# Patient Record
Sex: Male | Born: 1940 | Race: Black or African American | Hispanic: No | Marital: Single | Smoking: Former smoker
Health system: Southern US, Community
[De-identification: ages and names within clinical notes are randomized; demographics above are authoritative.]

## PROBLEM LIST (undated history)

## (undated) DIAGNOSIS — I6930 Unspecified sequelae of cerebral infarction: Secondary | ICD-10-CM

## (undated) DIAGNOSIS — I10 Essential (primary) hypertension: Secondary | ICD-10-CM

## (undated) DIAGNOSIS — Z981 Arthrodesis status: Secondary | ICD-10-CM

## (undated) DIAGNOSIS — N189 Chronic kidney disease, unspecified: Secondary | ICD-10-CM

## (undated) DIAGNOSIS — D649 Anemia, unspecified: Principal | ICD-10-CM

## (undated) DIAGNOSIS — M21372 Foot drop, left foot: Secondary | ICD-10-CM

## (undated) DIAGNOSIS — I2510 Atherosclerotic heart disease of native coronary artery without angina pectoris: Secondary | ICD-10-CM

## (undated) DIAGNOSIS — I509 Heart failure, unspecified: Secondary | ICD-10-CM

## (undated) DIAGNOSIS — I639 Cerebral infarction, unspecified: Secondary | ICD-10-CM

## (undated) DIAGNOSIS — I739 Peripheral vascular disease, unspecified: Secondary | ICD-10-CM

## (undated) HISTORY — DX: Essential (primary) hypertension: I10

## (undated) HISTORY — PX: TONSILECTOMY, ADENOIDECTOMY, BILATERAL MYRINGOTOMY AND TUBES: SHX2538

## (undated) HISTORY — DX: Anemia, unspecified: D64.9

## (undated) HISTORY — DX: Cerebral infarction, unspecified: I63.9

## (undated) HISTORY — DX: Peripheral vascular disease, unspecified: I73.9

## (undated) HISTORY — PX: EYE SURGERY: SHX253

## (undated) HISTORY — PX: CATARACT EXTRACTION: SUR2

## (undated) HISTORY — DX: Foot drop, left foot: M21.372

## (undated) HISTORY — DX: Unspecified sequelae of cerebral infarction: I69.30

---

## 1997-12-08 ENCOUNTER — Inpatient Hospital Stay (HOSPITAL_COMMUNITY): Admission: RE | Admit: 1997-12-08 | Discharge: 1997-12-12 | Payer: Self-pay | Admitting: Orthopaedic Surgery

## 1998-12-11 ENCOUNTER — Encounter: Payer: Self-pay | Admitting: Orthopedic Surgery

## 1998-12-11 ENCOUNTER — Ambulatory Visit (HOSPITAL_COMMUNITY): Admission: RE | Admit: 1998-12-11 | Discharge: 1998-12-11 | Payer: Self-pay | Admitting: Orthopedic Surgery

## 1999-01-08 ENCOUNTER — Ambulatory Visit (HOSPITAL_COMMUNITY): Admission: RE | Admit: 1999-01-08 | Discharge: 1999-01-08 | Payer: Self-pay | Admitting: Orthopedic Surgery

## 1999-01-08 ENCOUNTER — Encounter: Payer: Self-pay | Admitting: Orthopedic Surgery

## 1999-11-16 ENCOUNTER — Ambulatory Visit (HOSPITAL_COMMUNITY): Admission: RE | Admit: 1999-11-16 | Discharge: 1999-11-16 | Payer: Self-pay | Admitting: Orthopedic Surgery

## 1999-11-16 ENCOUNTER — Encounter: Payer: Self-pay | Admitting: Orthopedic Surgery

## 1999-11-29 ENCOUNTER — Encounter: Payer: Self-pay | Admitting: Orthopedic Surgery

## 1999-11-29 ENCOUNTER — Ambulatory Visit (HOSPITAL_COMMUNITY): Admission: RE | Admit: 1999-11-29 | Discharge: 1999-11-29 | Payer: Self-pay | Admitting: Orthopedic Surgery

## 1999-12-13 ENCOUNTER — Ambulatory Visit (HOSPITAL_COMMUNITY): Admission: RE | Admit: 1999-12-13 | Discharge: 1999-12-13 | Payer: Self-pay | Admitting: Orthopedic Surgery

## 1999-12-13 ENCOUNTER — Encounter: Payer: Self-pay | Admitting: Orthopedic Surgery

## 1999-12-27 ENCOUNTER — Encounter: Payer: Self-pay | Admitting: Orthopedic Surgery

## 1999-12-27 ENCOUNTER — Ambulatory Visit (HOSPITAL_COMMUNITY): Admission: RE | Admit: 1999-12-27 | Discharge: 1999-12-27 | Payer: Self-pay | Admitting: Orthopedic Surgery

## 2000-11-23 ENCOUNTER — Emergency Department (HOSPITAL_COMMUNITY): Admission: EM | Admit: 2000-11-23 | Discharge: 2000-11-23 | Payer: Self-pay

## 2000-11-27 ENCOUNTER — Encounter: Payer: Self-pay | Admitting: Nephrology

## 2000-11-27 ENCOUNTER — Encounter: Admission: RE | Admit: 2000-11-27 | Discharge: 2000-11-27 | Payer: Self-pay | Admitting: Nephrology

## 2001-03-16 ENCOUNTER — Encounter: Payer: Self-pay | Admitting: Nephrology

## 2001-03-16 ENCOUNTER — Encounter: Admission: RE | Admit: 2001-03-16 | Discharge: 2001-03-16 | Payer: Self-pay | Admitting: Nephrology

## 2001-04-21 ENCOUNTER — Encounter: Admission: RE | Admit: 2001-04-21 | Discharge: 2001-04-21 | Payer: Self-pay | Admitting: Neurosurgery

## 2001-04-21 ENCOUNTER — Encounter: Payer: Self-pay | Admitting: Neurosurgery

## 2004-02-19 ENCOUNTER — Emergency Department (HOSPITAL_COMMUNITY): Admission: EM | Admit: 2004-02-19 | Discharge: 2004-02-19 | Payer: Self-pay | Admitting: Emergency Medicine

## 2004-08-19 HISTORY — PX: KNEE SURGERY: SHX244

## 2004-10-16 ENCOUNTER — Emergency Department (HOSPITAL_COMMUNITY): Admission: EM | Admit: 2004-10-16 | Discharge: 2004-10-16 | Payer: Self-pay | Admitting: Emergency Medicine

## 2005-01-03 ENCOUNTER — Encounter: Admission: RE | Admit: 2005-01-03 | Discharge: 2005-01-03 | Payer: Self-pay | Admitting: Orthopaedic Surgery

## 2005-01-17 ENCOUNTER — Encounter: Admission: RE | Admit: 2005-01-17 | Discharge: 2005-01-17 | Payer: Self-pay | Admitting: Nephrology

## 2005-02-04 ENCOUNTER — Ambulatory Visit: Payer: Self-pay | Admitting: Physical Medicine & Rehabilitation

## 2005-02-04 ENCOUNTER — Inpatient Hospital Stay (HOSPITAL_COMMUNITY): Admission: RE | Admit: 2005-02-04 | Discharge: 2005-02-09 | Payer: Self-pay | Admitting: Orthopaedic Surgery

## 2005-03-05 ENCOUNTER — Encounter: Admission: RE | Admit: 2005-03-05 | Discharge: 2005-04-01 | Payer: Self-pay | Admitting: Orthopaedic Surgery

## 2007-08-20 HISTORY — PX: SPINE SURGERY: SHX786

## 2007-10-07 ENCOUNTER — Ambulatory Visit (HOSPITAL_COMMUNITY): Admission: RE | Admit: 2007-10-07 | Discharge: 2007-10-08 | Payer: Self-pay | Admitting: Orthopaedic Surgery

## 2007-10-13 ENCOUNTER — Emergency Department (HOSPITAL_COMMUNITY): Admission: EM | Admit: 2007-10-13 | Discharge: 2007-10-13 | Payer: Self-pay | Admitting: Emergency Medicine

## 2008-03-13 ENCOUNTER — Emergency Department (HOSPITAL_COMMUNITY): Admission: EM | Admit: 2008-03-13 | Discharge: 2008-03-13 | Payer: Self-pay | Admitting: Emergency Medicine

## 2008-05-18 ENCOUNTER — Emergency Department (HOSPITAL_COMMUNITY): Admission: EM | Admit: 2008-05-18 | Discharge: 2008-05-18 | Payer: Self-pay | Admitting: Emergency Medicine

## 2008-07-21 ENCOUNTER — Encounter: Admission: RE | Admit: 2008-07-21 | Discharge: 2008-07-21 | Payer: Self-pay | Admitting: Orthopaedic Surgery

## 2008-08-10 ENCOUNTER — Ambulatory Visit (HOSPITAL_COMMUNITY): Admission: RE | Admit: 2008-08-10 | Discharge: 2008-08-11 | Payer: Self-pay | Admitting: Orthopaedic Surgery

## 2009-08-08 ENCOUNTER — Encounter: Admission: RE | Admit: 2009-08-08 | Discharge: 2009-08-08 | Payer: Self-pay | Admitting: Orthopaedic Surgery

## 2010-08-19 HISTORY — PX: NECK SURGERY: SHX720

## 2010-09-08 ENCOUNTER — Encounter: Payer: Self-pay | Admitting: Orthopedic Surgery

## 2010-09-09 ENCOUNTER — Encounter: Payer: Self-pay | Admitting: Orthopaedic Surgery

## 2010-11-02 ENCOUNTER — Other Ambulatory Visit: Payer: Self-pay | Admitting: Nephrology

## 2010-11-02 ENCOUNTER — Ambulatory Visit
Admission: RE | Admit: 2010-11-02 | Discharge: 2010-11-02 | Disposition: A | Discharge status: Home / Self Care | Payer: Medicare PPO | Source: Ambulatory Visit | Attending: Nephrology | Admitting: Nephrology

## 2010-11-02 DIAGNOSIS — M549 Dorsalgia, unspecified: Secondary | ICD-10-CM

## 2011-01-01 NOTE — Op Note (Signed)
NAME:  Luis Poole, Luis Poole                 ACCOUNT NO.:  [id number]   MEDICAL RECORD NO.:  [id number]          PATIENT TYPE:  AMB   LOCATION:  SDS                          FACILITY:  [facility]   PHYSICIAN:  Mark C. Ophelia Charter, M.D.    DATE OF BIRTH:  [birth year]   DATE OF PROCEDURE:  10/07/2007  DATE OF DISCHARGE:                               OPERATIVE REPORT   POSTOPERATIVE DIAGNOSIS:  C5-6, C6-7 spondylosis.   POSTOPERATIVE DIAGNOSIS:  C5-6, C6-7 spondylosis.   PROCEDURE:  C5-C6, C6-C7 anterior cervical diskectomy and fusion and  allograft plating.   SURGEON:  Mark C. Ophelia Charter, M.D.   ASSISTANT:  Maud Deed, P.A.-C.   ANESTHESIA:  GOT plus Marcaine locally.   ESTIMATED BLOOD LOSS:  Minimal.   DRAINS:  One Hemovac, neck.   INDICATIONS FOR PROCEDURE:  This 70 year old male has had progressive  spondylosis of the neck with progressive pain with disk protrusion and  spondylitic changes.  He has failed conservative treatment and has had  progressive arm pain primarily left C6 symptoms.   PROCEDURE:  After induction of general anesthesia and orotracheal  intubation, with horseshoe head holder, 5 pounds cervical traction, the  neck was prepped with DuraPrep.  Straps were placed on the wrist.  The  arms were tucked at the side with foam pads for the ulnar nerve, and the  area was squared with towels, sterile Mayo stand at the head.  Sterile  skin marker in a prominent skin fold starting at the midline and  extending to the left and thyroid sheets and drapes.   Incision was made at the midline extending to the left.  Blunt  dissection down inferior to the omohyoid where the prominent C5-6 bur  was noted.  The patient had a barrel chest, extremely short neck as well  as the thick neck, and the deepest retractors would not reach.  We had  to use the lumbar set with teeth blades at the longus coli, smooth  blades up and down the deepest ones with a Cloward set for  visualization.  The spurs had to  be removed at C5-6.  The disk space was  extremely tight.  A 4-mm bur was used to bur down the endplates, and  using the handheld curettes which would barely reach into the depth due  to his thick neck, takedown of spurs was performed.  1 or 2-mm Kerrisons  were used with operative microscope, taking down the posterior  longitudinal ligament until the dura was seen all the way across C5-6.  Spurs were removed on both right and left sides.  The uncovertebral  joints were stripped.  Trial sizers were used, and a 7-mm cortical  cancellous graft was selected, allograft which was a lordotic, and it  was placed with traction applied by the C.R.N.A.  Identical procedure  was repeated at C6-7 level.  At this level, spurs had to be removed as  well.  Posterior longitudinal ligament was taken down partially due to  difficulty with good visualization at this level.  Spurs were removed  from the uncovertebral joints, right  and left, and then a 6-mm graft was  selected, lordotic, marked at the midline.  Air removed from the graft  with the syringe and then impacted into place.  C-arm was used for  visualization.  A plate was selected, 30 mm VueLock by [employer].  Screws  were inserted proximally.  Distal screws were inserted.  Oblique views  were used for visualization, but again, the inferior screws could not be  seen on any oblique views or lateral view but could be seen in  satisfactory position on AP view.  Under direct visualization looking at  the graft, it looked like it was all in good position.  The plate was at  the midline.  After irrigation, Hemovac was placed through a separate  stab incision with in-and-out technique using the trocar.  Platysma  closed with 3-0 Vicryl, 4-0 Vicryl subcuticular closure.  Tincture of  Benzoin, Steri-Strips, 4 x 4, soft cervical collar and transferred to  the recovery room.  Instrument count and needle count was correct.      Mark C. Ophelia Charter, M.D.   Electronically Signed     MCY/MEDQ  D:  10/07/2007  T:  10/08/2007  Job:  [id number]

## 2011-01-01 NOTE — Op Note (Signed)
NAMETHURMOND, HILDEBRAN                 ACCOUNT NO.:  [id number]   MEDICAL RECORD NO.:  [id number]          PATIENT TYPE:  INP   LOCATION:  6531                         FACILITY:  [facility]   PHYSICIAN:  Mark C. Ophelia Charter, M.D.    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   POSTOPERATIVE DIAGNOSIS:  C5-6, C6-7 pseudoarthrosis status post  anterior cervical fusion.   PROCEDURE:  C5-C7 posterior fusion, iliac bone marrow aspirate, Vitoss,  and spinous process wiring.   ESTIMATED BLOOD LOSS:  300 mL   COMPLICATIONS:  None.   There was poor visualization due to body habitus with both plain  radiograph multiple attempts, two different takes as well as with C-arm  including oblique AP and lateral views, all due to body habitus.   PROCEDURE:  After induction of general anesthesia, the patient was  placed prone on horseshoe head holder.  Standard prepping and draping  was performed.  Arms were tucked at the side with careful padding over  the ulnar nerve.  Sterile skin marker was used prior to Betadine Vi-  Drape application, sterile Mayo stand above the head and a thyroid sheet  and drapes.  Right posterior iliac crest was prepped with DuraPrep as  well.  Surgical time-out checklist was completed.  A 2 g of Ancef was  given.  Incision was made starting at the midline over C3, extended to  T1.  Based on palpable less spinous processes, subperiosteal dissection  was performed.  Self-retaining retractors were placed.  There was  significant amount of muscular venous bleeding and some arterial  bleeding present, which required to use the cautery and bipolar.  Multiple x-rays were taken.  Two different tacks with this a Kocher  clamp initially between C5-6 and also 4-5 and then finally a third  Kocher clamp was placed above  that.  Repeat x-rays were taken and it  appeared that the C7 was appropriately identified and C5 was bifid with  Kocher clamps.  There was trace motion at the pseudoarthrosis level and  significant normal motion at C4-5.  Bone was decorticated.  A 24-gauge  wire was triple stranded, twisted with a drill and a cable passed  underneath C7, __________ was passed under C6.  Initially, C5-6 was  tightened down securely, holding a Cobb down to keep the wire down low  on the spinous process, tightened down, twisted, cut and then the wire  was passed around C7 over the top C5.  We then pulled down securely to  tighten the lower level.  Both wires were tested with right-angle clamps  and were snug, would not move.  Bone was decorticated with a bur onto  the lamina out to the facet joints.  There was good bone bleeding and  packing had been placed until bleeding settled down.  Iliac crest on the  right was aspirated with stab incision with 15 blade, sharp trocar  through the  cortex, and using the smooth trocar as it was slid down  between the tables.  Bone marrow was aspirated and after aspiration, the  needle was advanced further and then repeated about 12 mL total was  obtained, was placed in the Vitoss and closed.  C-arm was brought in and  oblique x-rays AP and lateral were taken, which did show the top of the  C5 spinous process.  Top of the process was aligned with the plate  corresponding with preoperative CT scan.  C6 could not be visualized on  any attempted technique including maximum portable penetration single  exposure.  An oblique spot fluoro views were taken with full  penetration, which showed the wire at the top of C5.  Using a Kocher C5,  C6, and C7 all moved as one.  There was no motion after wiring.  The  plate at C5 was visualized and the wire was in line with the top of C5  plate as it should be.  After irrigation, layer closure  with 0 Vicryl in the deep fascia, 2-0 in the  subcutaneous tissue, 3-0  Vicryl subcuticular layer.  Tincture of Benzoin, Steri-Strips, 4x4s  tape, and soft collar.  Instrument count and needle count was correct.  Surgical time-out checklist at the end of the procedure was completed  with appropriate count.  No specimen.      Mark C. Ophelia Charter, M.D.  Electronically Signed     MCY/MEDQ  D:  08/10/2008  T:  08/11/2008  Job:  [id number]

## 2011-01-04 NOTE — Discharge Summary (Signed)
NAMERAWLEY, HARJU                 ACCOUNT NO.:  [id number]   MEDICAL RECORD NO.:  [id number]          PATIENT TYPE:  INP   LOCATION:  5041                         FACILITY:  [facility]   PHYSICIAN:  Mark C. Ophelia Charter, M.D.    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  Gout.   PROCEDURE:  Left knee revision of tibial component.   HISTORY OF PRESENT ILLNESS:  This is a 70 year old male who had total knee  arthroplasty in 1999, has had some progressive persistent pain with it since  that time.  Work-up has included aspiration, bone scan, serial x-rays which  showed no evidence of problems up until the last six months when he  developed tibial subsidence consistent with loosening and then had a  positive bone scan with CRP, sed rate and aspiration again showing no  evidence of infection.  He is admitted at this time for tibial revision.  Admission medications include Diovan/hydrochlorothiazide 160/25 one by mouth  daily, ibuprofen, Allegra D, Vicodin, one Bayer aspirin per day, Vytorin  10/21 by mouth daily.  He is on some antibiotics for a bronchitis which was  amoxicillin/clavulanate 500 mg twice daily that he was finishing.   ADMISSION LABS:  Hemoglobin 12.6, hematocrit 38.0, white blood cell count  7300 with normal differential.  PT/PTT were normal.  Glucose was mildly  elevated at 103 with follow-up at 122 and 112.  There were a few epithelial  cells on his urinalysis with trace ketones.   HOSPITAL COURSE:  The patient was admitted, taken to the operating room and  underwent removal of the tibial component and revision of the tibial  component and tibial spacer using slightly thicker spacers due to the  subsidence that he developed.  18 mm #7 inserts, Scorpio Flex tibial bearing  inset was inserted, #7 tray  revision component.  The patient received  perioperative antibiotics.  Postoperatively was seen by physical therapy and  occupational therapy.  He made rapid progress with the only problem, foot  swelling, that occurred on February 08, 2005 with previous diagnosis of gout, he  was placed on colchicine which gave him quick rapid relief. He was  discharged on February 09, 2005.  Home equipment needs were made.  He was taking  Coumadin for a period of two weeks, taking Vicodin ES for pain and also has  colchicine.   Condition at discharge is improved.  X-ray showed good condition of the  component.      Mark C. Ophelia Charter, M.D.  Electronically Signed     MCY/MEDQ  D:  04/17/2005  T:  04/18/2005  Job:  [id number]

## 2011-01-04 NOTE — Op Note (Signed)
NAMEDEAGLAN, LILE                 ACCOUNT NO.:  [id number]   MEDICAL RECORD NO.:  [id number]          PATIENT TYPE:  INP   LOCATION:  2861                         FACILITY:  [facility]   PHYSICIAN:  Mark C. Ophelia Charter, M.D.    DATE OF BIRTH:  10/15/1940   DATE OF PROCEDURE:  02/04/2005  DATE OF DISCHARGE:                                 OPERATIVE REPORT   PREOPERATIVE DIAGNOSIS:  Painful loose left tibial total knee arthroplasty  component.   POSTOPERATIVE DIAGNOSIS:  Painful loose left tibial total knee arthroplasty  component.   PROCEDURE:  Revision of left tibial component and polyethylene spacer.   SURGEON:  Mark C. Ophelia Charter, M.D.   ASSISTANT:  Vanita Panda. Magnus Ivan, M.D.   ANESTHESIA:  GOT.   TOURNIQUET TIME:  One hour and 15 minutes.   INDICATIONS FOR PROCEDURE:  This 70 year old male had a total knee  arthroplasty done in 1999.  He had some pain postoperatively.  A workup  included a bone scan, knee aspirate, sedimentation rate, CRP, cultures,  which were all negative.  He had continued symptoms and follow-up x-rays the  following year were normal.  He presented again this year in 2006, with  increasing pain and x-rays showing evidence of a tibial subsidence.  The  bone scan showed a reaction around the tibial component, consistent with  loosening, with no evidence of loosening of the femoral component.  The  sedimentation rate and C-reactive protein showed no evidence of infection.  He is brought at this time for a revision of the tibial component due to  subsidence.   DESCRIPTION OF PROCEDURE:  After preoperative Ancef prophylaxis and a  standard prepping and draping, the ischial pervious stockinette and Coban  was applied.  The leg was wrapped in an Esmarch and the tourniquet inflated.  The old incision was opened.  The quadriceps tendon was split following the  old suture line, splitting it between it between the medial one-third and  lateral two-thirds.  The patella was  then able to be everted.  The femoral  component was inspected, palpated and an impactor was placed.  It was tried  to loosen, as well as place an osteotome underneath it, and it was secure.  It was not loose.  It was also normal in appearance on bone scan.  The  tibial component showed loosening with a 1 or 2 mm mantle of cement present,  and the cement easily split away from the under-surface of the component.  Then the cement was removed.  Continued _osteotomes all the way around was  acquired in order to get the tibial component base plate completely free.  An extractor was inserted and tightened down and with some difficult getting  the patella out of the way, the tibial component was removed.  Sequential  hand reaming was performed after the initial power reaming.  The 11 mm keel  was selected.  Trials were inserted based on a #7, which gave excellent fit.  A new fresh cut was made, taking 2 mm of bone.  With the trial in, the sizer  showed that a 10 mm spacer gave excellent prostration of the stability in  flexion and extension, and full extension.  The permanent prosthesis was  opened.  Cement was vacuum-mixed and inserted on the stem as well as  underneath the base plate.  The prosthesis held still until 15 minutes, when  the cement was completely hard.  There was good stability of the component.  Poly was inserted and adequate findings of stability.  Irrigation, and the  tourniquet deflation, and then a standard closure with #1 Ethibond in the  quadriceps tendon and deep tissue, with #2-  0 Vicryl in the subcutaneous tissue and skin staple closure.  A  postoperative dressing.  A knee immobilizer.  The instrument count and  needle count were correct.   The patient was transferred to the recovery room in stable condition.       MCY/MEDQ  D:  02/04/2005  T:  02/04/2005  Job:  [id number]

## 2011-02-14 ENCOUNTER — Other Ambulatory Visit: Payer: Self-pay | Admitting: Specialist

## 2011-02-14 DIAGNOSIS — M7918 Myalgia, other site: Secondary | ICD-10-CM

## 2011-02-14 DIAGNOSIS — M79606 Pain in leg, unspecified: Secondary | ICD-10-CM

## 2011-02-23 ENCOUNTER — Other Ambulatory Visit: Payer: Medicare PPO

## 2011-03-05 ENCOUNTER — Other Ambulatory Visit: Payer: Medicare PPO

## 2011-03-09 ENCOUNTER — Ambulatory Visit
Admission: RE | Admit: 2011-03-09 | Discharge: 2011-03-09 | Disposition: A | Discharge status: Home / Self Care | Payer: Medicare PPO | Source: Ambulatory Visit | Attending: Specialist | Admitting: Specialist

## 2011-03-09 DIAGNOSIS — M7918 Myalgia, other site: Secondary | ICD-10-CM

## 2011-03-09 DIAGNOSIS — M79606 Pain in leg, unspecified: Secondary | ICD-10-CM

## 2011-04-29 ENCOUNTER — Emergency Department (HOSPITAL_COMMUNITY)
Admission: EM | Admit: 2011-04-29 | Discharge: 2011-04-30 | Disposition: A | Discharge status: Home / Self Care | Payer: Medicare HMO | Attending: Emergency Medicine | Admitting: Emergency Medicine

## 2011-04-29 ENCOUNTER — Emergency Department (HOSPITAL_COMMUNITY): Payer: Medicare HMO

## 2011-04-29 DIAGNOSIS — R220 Localized swelling, mass and lump, head: Secondary | ICD-10-CM | POA: Insufficient documentation

## 2011-04-29 DIAGNOSIS — R209 Unspecified disturbances of skin sensation: Secondary | ICD-10-CM | POA: Insufficient documentation

## 2011-04-29 DIAGNOSIS — Z79899 Other long term (current) drug therapy: Secondary | ICD-10-CM | POA: Insufficient documentation

## 2011-04-29 DIAGNOSIS — I10 Essential (primary) hypertension: Secondary | ICD-10-CM | POA: Insufficient documentation

## 2011-04-29 DIAGNOSIS — R29898 Other symptoms and signs involving the musculoskeletal system: Secondary | ICD-10-CM | POA: Insufficient documentation

## 2011-04-29 DIAGNOSIS — R221 Localized swelling, mass and lump, neck: Secondary | ICD-10-CM | POA: Insufficient documentation

## 2011-04-29 DIAGNOSIS — E119 Type 2 diabetes mellitus without complications: Secondary | ICD-10-CM | POA: Insufficient documentation

## 2011-04-29 LAB — POCT I-STAT, CHEM 8
BUN: 16 mg/dL (ref 6–23)
Creatinine, Ser: 1.1 mg/dL (ref 0.50–1.35)
Glucose, Bld: 133 mg/dL — ABNORMAL HIGH (ref 70–99)
Sodium: 138 mEq/L (ref 135–145)
TCO2: 25 mmol/L (ref 0–100)

## 2011-04-29 LAB — CBC
MCV: 84.5 fL (ref 78.0–100.0)
Platelets: 181 10*3/uL (ref 150–400)
RBC: 4.45 MIL/uL (ref 4.22–5.81)
WBC: 5.9 10*3/uL (ref 4.0–10.5)

## 2011-04-29 LAB — DIFFERENTIAL
Basophils Absolute: 0 10*3/uL (ref 0.0–0.1)
Eosinophils Absolute: 0.1 10*3/uL (ref 0.0–0.7)
Eosinophils Relative: 2 % (ref 0–5)
Lymphs Abs: 2.9 10*3/uL (ref 0.7–4.0)
Monocytes Relative: 5 % (ref 3–12)

## 2011-04-30 LAB — URINE MICROSCOPIC-ADD ON

## 2011-04-30 LAB — URINALYSIS, ROUTINE W REFLEX MICROSCOPIC
Bilirubin Urine: NEGATIVE
Leukocytes, UA: NEGATIVE
Nitrite: NEGATIVE
Specific Gravity, Urine: 1.007 (ref 1.005–1.030)
pH: 6 (ref 5.0–8.0)

## 2011-05-03 ENCOUNTER — Encounter (HOSPITAL_COMMUNITY)
Admission: RE | Admit: 2011-05-03 | Discharge: 2011-05-03 | Disposition: A | Discharge status: Home / Self Care | Payer: Medicare HMO | Source: Ambulatory Visit | Attending: Specialist | Admitting: Specialist

## 2011-05-03 ENCOUNTER — Other Ambulatory Visit (HOSPITAL_COMMUNITY): Payer: Medicare PPO

## 2011-05-03 ENCOUNTER — Other Ambulatory Visit (HOSPITAL_COMMUNITY): Payer: Self-pay | Admitting: Specialist

## 2011-05-03 DIAGNOSIS — M5136 Other intervertebral disc degeneration, lumbar region: Secondary | ICD-10-CM

## 2011-05-03 DIAGNOSIS — M47816 Spondylosis without myelopathy or radiculopathy, lumbar region: Secondary | ICD-10-CM

## 2011-05-03 LAB — CBC
MCHC: 34.4 g/dL (ref 30.0–36.0)
Platelets: 196 10*3/uL (ref 150–400)
RDW: 13.8 % (ref 11.5–15.5)
WBC: 6.2 10*3/uL (ref 4.0–10.5)

## 2011-05-03 LAB — COMPREHENSIVE METABOLIC PANEL
ALT: 13 U/L (ref 0–53)
CO2: 27 mEq/L (ref 19–32)
Calcium: 9.8 mg/dL (ref 8.4–10.5)
Chloride: 100 mEq/L (ref 96–112)
GFR calc Af Amer: >60 mL/min (ref >60)
GFR calc non Af Amer: 57 mL/min — ABNORMAL LOW (ref >60)
Glucose, Bld: 93 mg/dL (ref 70–99)
Sodium: 138 mEq/L (ref 135–145)
Total Bilirubin: 0.6 mg/dL (ref 0.3–1.2)

## 2011-05-03 LAB — TYPE AND SCREEN: ABO/RH(D): O POS

## 2011-05-03 LAB — ABO/RH: ABO/RH(D): O POS

## 2011-05-03 LAB — URINALYSIS, ROUTINE W REFLEX MICROSCOPIC
Glucose, UA: NEGATIVE mg/dL
Hgb urine dipstick: NEGATIVE
Ketones, ur: NEGATIVE mg/dL
Protein, ur: NEGATIVE mg/dL
pH: 6.5 (ref 5.0–8.0)

## 2011-05-03 LAB — DIFFERENTIAL
Basophils Absolute: 0 10*3/uL (ref 0.0–0.1)
Basophils Relative: 1 % (ref 0–1)
Eosinophils Absolute: 0.1 10*3/uL (ref 0.0–0.7)
Eosinophils Relative: 2 % (ref 0–5)

## 2011-05-03 LAB — PROTIME-INR: INR: 1 (ref 0.00–1.49)

## 2011-05-06 ENCOUNTER — Inpatient Hospital Stay (HOSPITAL_COMMUNITY): Payer: Medicare HMO

## 2011-05-06 ENCOUNTER — Inpatient Hospital Stay (HOSPITAL_COMMUNITY)
Admission: RE | Admit: 2011-05-06 | Discharge: 2011-05-16 | DRG: 459 | Disposition: A | Discharge status: Skilled Nursing Facility | Payer: Medicare HMO | Source: Ambulatory Visit | Attending: Specialist | Admitting: Specialist

## 2011-05-06 DIAGNOSIS — Z01818 Encounter for other preprocedural examination: Secondary | ICD-10-CM

## 2011-05-06 DIAGNOSIS — Z96659 Presence of unspecified artificial knee joint: Secondary | ICD-10-CM

## 2011-05-06 DIAGNOSIS — IMO0002 Reserved for concepts with insufficient information to code with codable children: Secondary | ICD-10-CM | POA: Diagnosis present

## 2011-05-06 DIAGNOSIS — H269 Unspecified cataract: Secondary | ICD-10-CM | POA: Diagnosis present

## 2011-05-06 DIAGNOSIS — E119 Type 2 diabetes mellitus without complications: Secondary | ICD-10-CM | POA: Diagnosis present

## 2011-05-06 DIAGNOSIS — Z01812 Encounter for preprocedural laboratory examination: Secondary | ICD-10-CM

## 2011-05-06 DIAGNOSIS — M216X9 Other acquired deformities of unspecified foot: Secondary | ICD-10-CM | POA: Diagnosis not present

## 2011-05-06 DIAGNOSIS — M109 Gout, unspecified: Secondary | ICD-10-CM | POA: Diagnosis present

## 2011-05-06 DIAGNOSIS — R29898 Other symptoms and signs involving the musculoskeletal system: Secondary | ICD-10-CM | POA: Diagnosis not present

## 2011-05-06 DIAGNOSIS — I672 Cerebral atherosclerosis: Secondary | ICD-10-CM | POA: Diagnosis present

## 2011-05-06 DIAGNOSIS — E871 Hypo-osmolality and hyponatremia: Secondary | ICD-10-CM | POA: Diagnosis present

## 2011-05-06 DIAGNOSIS — M47817 Spondylosis without myelopathy or radiculopathy, lumbosacral region: Principal | ICD-10-CM | POA: Diagnosis present

## 2011-05-06 DIAGNOSIS — R32 Unspecified urinary incontinence: Secondary | ICD-10-CM | POA: Diagnosis not present

## 2011-05-06 DIAGNOSIS — Z981 Arthrodesis status: Secondary | ICD-10-CM

## 2011-05-06 DIAGNOSIS — J029 Acute pharyngitis, unspecified: Secondary | ICD-10-CM | POA: Diagnosis not present

## 2011-05-06 DIAGNOSIS — M069 Rheumatoid arthritis, unspecified: Secondary | ICD-10-CM | POA: Diagnosis present

## 2011-05-06 DIAGNOSIS — Z79899 Other long term (current) drug therapy: Secondary | ICD-10-CM

## 2011-05-06 DIAGNOSIS — I6330 Cerebral infarction due to thrombosis of unspecified cerebral artery: Secondary | ICD-10-CM | POA: Diagnosis not present

## 2011-05-06 DIAGNOSIS — Z01810 Encounter for preprocedural cardiovascular examination: Secondary | ICD-10-CM

## 2011-05-06 DIAGNOSIS — I10 Essential (primary) hypertension: Secondary | ICD-10-CM | POA: Diagnosis present

## 2011-05-06 DIAGNOSIS — Q762 Congenital spondylolisthesis: Secondary | ICD-10-CM

## 2011-05-06 DIAGNOSIS — E669 Obesity, unspecified: Secondary | ICD-10-CM | POA: Diagnosis present

## 2011-05-06 DIAGNOSIS — Z23 Encounter for immunization: Secondary | ICD-10-CM

## 2011-05-06 DIAGNOSIS — Z7982 Long term (current) use of aspirin: Secondary | ICD-10-CM

## 2011-05-06 HISTORY — DX: Arthrodesis status: Z98.1

## 2011-05-06 LAB — GLUCOSE, CAPILLARY
Glucose-Capillary: 147 mg/dL — ABNORMAL HIGH (ref 70–99)
Glucose-Capillary: 151 mg/dL — ABNORMAL HIGH (ref 70–99)

## 2011-05-07 LAB — BASIC METABOLIC PANEL
CO2: 26 mEq/L (ref 19–32)
Calcium: 8.2 mg/dL — ABNORMAL LOW (ref 8.4–10.5)
Creatinine, Ser: 1.35 mg/dL (ref 0.50–1.35)
GFR calc Af Amer: >60 mL/min (ref >60)
GFR calc non Af Amer: 53 mL/min — ABNORMAL LOW (ref >60)
Sodium: 133 mEq/L — ABNORMAL LOW (ref 135–145)

## 2011-05-07 LAB — GLUCOSE, CAPILLARY: Glucose-Capillary: 127 mg/dL — ABNORMAL HIGH (ref 70–99)

## 2011-05-07 LAB — HEMOGLOBIN AND HEMATOCRIT, BLOOD
HCT: 26.4 % — ABNORMAL LOW (ref 39.0–52.0)
Hemoglobin: 8.9 g/dL — ABNORMAL LOW (ref 13.0–17.0)

## 2011-05-08 ENCOUNTER — Inpatient Hospital Stay (HOSPITAL_COMMUNITY): Payer: Medicare HMO

## 2011-05-08 ENCOUNTER — Encounter (HOSPITAL_COMMUNITY): Payer: Self-pay

## 2011-05-08 LAB — GLUCOSE, CAPILLARY
Glucose-Capillary: 98 mg/dL (ref 70–99)
Glucose-Capillary: 112 mg/dL — ABNORMAL HIGH (ref 70–99)

## 2011-05-08 LAB — BASIC METABOLIC PANEL
BUN: 23 mg/dL (ref 6–23)
Chloride: 103 mEq/L (ref 96–112)
GFR calc Af Amer: >60 mL/min (ref >60)
Potassium: 4.8 mEq/L (ref 3.5–5.1)

## 2011-05-08 LAB — URINALYSIS, ROUTINE W REFLEX MICROSCOPIC
Glucose, UA: NEGATIVE mg/dL
Leukocytes, UA: NEGATIVE
Protein, ur: NEGATIVE mg/dL
Urobilinogen, UA: 0.2 mg/dL (ref 0.0–1.0)

## 2011-05-08 LAB — URINE MICROSCOPIC-ADD ON

## 2011-05-08 LAB — HEMOGLOBIN AND HEMATOCRIT, BLOOD: Hemoglobin: 8.1 g/dL — ABNORMAL LOW (ref 13.0–17.0)

## 2011-05-09 ENCOUNTER — Inpatient Hospital Stay (HOSPITAL_COMMUNITY): Payer: Medicare HMO

## 2011-05-09 DIAGNOSIS — M4716 Other spondylosis with myelopathy, lumbar region: Secondary | ICD-10-CM

## 2011-05-09 LAB — HEMOGLOBIN AND HEMATOCRIT, BLOOD
HCT: 25 % — ABNORMAL LOW (ref 39.0–52.0)
Hemoglobin: 8.5 g/dL — ABNORMAL LOW (ref 13.0–17.0)

## 2011-05-09 LAB — CBC
HCT: 24.8 % — ABNORMAL LOW (ref 39.0–52.0)
MCH: 28.4 pg (ref 26.0–34.0)
MCHC: 33.9 g/dL (ref 30.0–36.0)
RDW: 14.2 % (ref 11.5–15.5)

## 2011-05-09 LAB — BASIC METABOLIC PANEL
BUN: 21 mg/dL (ref 6–23)
Calcium: 9.2 mg/dL (ref 8.4–10.5)
GFR calc Af Amer: >60 mL/min (ref >60)
GFR calc non Af Amer: >60 mL/min (ref >60)
Potassium: 4.5 mEq/L (ref 3.5–5.1)

## 2011-05-09 LAB — URINE CULTURE: Culture: NO GROWTH

## 2011-05-09 LAB — DIFFERENTIAL
Basophils Absolute: 0 10*3/uL (ref 0.0–0.1)
Basophils Relative: 0 % (ref 0–1)
Eosinophils Relative: 0 % (ref 0–5)
Lymphocytes Relative: 14 % (ref 12–46)
Monocytes Absolute: 1 10*3/uL (ref 0.1–1.0)

## 2011-05-09 LAB — GLUCOSE, CAPILLARY
Glucose-Capillary: 143 mg/dL — ABNORMAL HIGH (ref 70–99)
Glucose-Capillary: 162 mg/dL — ABNORMAL HIGH (ref 70–99)

## 2011-05-10 LAB — COMPREHENSIVE METABOLIC PANEL
ALT: 18
Alkaline Phosphatase: 55
BUN: 11
CO2: 31
Calcium: 9.6
GFR calc non Af Amer: 57 — ABNORMAL LOW
Glucose, Bld: 103 — ABNORMAL HIGH
Sodium: 137
Total Protein: 7.7

## 2011-05-10 LAB — URINALYSIS, ROUTINE W REFLEX MICROSCOPIC
Bilirubin Urine: NEGATIVE
Hgb urine dipstick: NEGATIVE
Nitrite: NEGATIVE
Protein, ur: NEGATIVE
Urobilinogen, UA: 0.2

## 2011-05-10 LAB — GLUCOSE, CAPILLARY
Glucose-Capillary: 127 mg/dL — ABNORMAL HIGH (ref 70–99)
Glucose-Capillary: 131 mg/dL — ABNORMAL HIGH (ref 70–99)

## 2011-05-10 LAB — CBC
HCT: 40.5
Hemoglobin: 13.3
MCHC: 32.9
RBC: 4.55
RDW: 15

## 2011-05-10 LAB — PROTIME-INR
INR: 1
Prothrombin Time: 13.4

## 2011-05-10 LAB — DIFFERENTIAL
Basophils Relative: 0
Eosinophils Absolute: 0.1
Lymphs Abs: 2.4
Neutro Abs: 4.1
Neutrophils Relative %: 59

## 2011-05-10 LAB — APTT: aPTT: 31

## 2011-05-11 ENCOUNTER — Inpatient Hospital Stay (HOSPITAL_COMMUNITY): Payer: Medicare HMO

## 2011-05-11 DIAGNOSIS — R0989 Other specified symptoms and signs involving the circulatory and respiratory systems: Secondary | ICD-10-CM

## 2011-05-11 LAB — GLUCOSE, CAPILLARY
Glucose-Capillary: 134 mg/dL — ABNORMAL HIGH (ref 70–99)
Glucose-Capillary: 153 mg/dL — ABNORMAL HIGH (ref 70–99)

## 2011-05-11 LAB — LIPID PANEL
LDL Cholesterol: 132 mg/dL — ABNORMAL HIGH (ref 0–99)
VLDL: 15 mg/dL (ref 0–40)

## 2011-05-11 LAB — HEMOGLOBIN A1C
Hgb A1c MFr Bld: 6.4 % — ABNORMAL HIGH (ref <5.7)
Mean Plasma Glucose: 137 mg/dL — ABNORMAL HIGH (ref <117)

## 2011-05-12 LAB — GLUCOSE, CAPILLARY: Glucose-Capillary: 137 mg/dL — ABNORMAL HIGH (ref 70–99)

## 2011-05-13 LAB — GLUCOSE, CAPILLARY
Glucose-Capillary: 154 mg/dL — ABNORMAL HIGH (ref 70–99)
Glucose-Capillary: 117 mg/dL — ABNORMAL HIGH (ref 70–99)

## 2011-05-14 ENCOUNTER — Inpatient Hospital Stay (HOSPITAL_COMMUNITY): Payer: Medicare HMO

## 2011-05-14 LAB — GLUCOSE, CAPILLARY: Glucose-Capillary: 109 mg/dL — ABNORMAL HIGH (ref 70–99)

## 2011-05-15 ENCOUNTER — Inpatient Hospital Stay (HOSPITAL_COMMUNITY): Payer: Medicare HMO

## 2011-05-15 LAB — GLUCOSE, CAPILLARY: Glucose-Capillary: 100 mg/dL — ABNORMAL HIGH (ref 70–99)

## 2011-05-15 MED ORDER — IOHEXOL 350 MG/ML SOLN
50.0000 mL | Freq: Once | INTRAVENOUS | Status: AC | PRN
  Administered 2011-05-15: 50 mL via INTRAVENOUS

## 2011-05-16 LAB — GLUCOSE, CAPILLARY
Glucose-Capillary: 96 mg/dL (ref 70–99)
Glucose-Capillary: 91 mg/dL (ref 70–99)
Glucose-Capillary: 97 mg/dL (ref 70–99)

## 2011-05-20 LAB — CBC
Hemoglobin: 12.4 — ABNORMAL LOW
MCHC: 33.2
MCV: 86
RBC: 4.33

## 2011-05-20 LAB — POCT I-STAT, CHEM 8
Calcium, Ion: 1.12
Chloride: 103
Glucose, Bld: 108 — ABNORMAL HIGH
HCT: 38 — ABNORMAL LOW
Hemoglobin: 12.9 — ABNORMAL LOW

## 2011-05-20 LAB — DIFFERENTIAL
Basophils Relative: 1
Eosinophils Absolute: 0
Eosinophils Relative: 1
Monocytes Absolute: 0.4
Monocytes Relative: 8

## 2011-05-21 NOTE — Op Note (Signed)
NAMEJOHNDANIEL, CATLIN NO.:  [id number]  MEDICAL RECORD NO.:  [id number]  LOCATION:  2550                         FACILITY:  [facility]  PHYSICIAN:  Kerrin Champagne, M.D.   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  PROCEDURES:  Gill procedure with removal of the central lamina and pars areas and inferior articular processes at bilateral L5.  Transforaminal lumbar interbody fusion at left L5-S1 with a DePuy 9-mm lordotic Concorde cage filled with local bone graft.  The L5-S1 pedicle screws and rod fixation with posterolateral fusion at L5-S1 utilizing local bone graft.  Bone mill was used at the end of the case for preparation, but most of the graft was prepared by hand.  Cell Saver was used during this case.  SURGEON:  Kerrin Champagne, MD  ASSISTANT:  Wende Neighbors, PA  ANESTHESIA:  General via orotracheal intubation, Dr. Arta Bruce. Supplemented with local infiltration of Marcaine 0.5% with 100,000 epinephrine 10 mL.  FINDINGS:  Bilateral L5 foraminal stenoses and loose neural large hypertrophic scar associated with the bilateral pars defects causing L5 nerve root entrapment.  ESTIMATED BLOOD LOSS:  400 mL, Cell Saver returned, 125 mL of hyper- hematocrit.  COMPLICATIONS:  None.  DRAINS:  Foley to straight drain, Hemovac x1 left lower lumbar.  The patient returned to PACU in good condition.  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  He has pain in typical L5 distribution bilaterally and left side greater than right.  At this preoperative evaluation, he indicated he had primarily right side  is worse than left-sided leg pain, however.  In the preop holding area discussion indicated, he was having left-sided pain greater than right, so that transforaminal lumbar interbody fusion was performed on the left side at the site of maximum discomfort.  The patient is seen in the office.  I had discussion regarding risks and benefits of procedure including infection, bleeding, and neurologic compromise.  He had attempted conservative management without relief of pain.  He has signed informed consent.  DESCRIPTION OF PROCEDURE:  This patient was seen in the preoperative holding area and marking of the left lower back with an X my initials L5- S1 describing the left side as being the side for the transforaminal lumbar interbody fusion.  All questions were answered.  He received standard preoperative antibiotics, 2 g Ancef an hour before surgery.  He was transported to the OR room #[facility] at [facility] used for this procedure via stretcher.  There, he underwent induction of general anesthesia, atraumatic, had Foley catheter placed.  He was carefully turned to the prone  position using the Biehle spine frame.  All pressure points were well padded.  PAS hose arms at the site at 99, the axillary rolls were used.  The patient then had standard prep with DuraPrep solution from the lower dorsal spine to mid sacral level.  Draped in the usual manner, iodine Vi-Drape.  Standard time-out protocol carried out prior to incision describing the patient, procedure, estimated blood loss, length of procedure in a standard fashion.  Then had infiltration of skin, subcu layers extending from about S2-S3 to the about the L3 level. Incision of the  midline extending from S3-L3 through skin and subcu layers down to the lumbodorsal fascia.  Fascia was incised in midline overlying spinous processes of S3, S2, S1, L5, L4, and L3.  Cobb elevators were used to elevate paralumbar muscles off the lumbosacral junction and also at L4 and L3 and then off the sacrum bilaterally using electrocautery to control bleeders.  First, cerebellar retractors were used.  Clamps were then placed on either side of spinous process at L5, intraoperative C-arm, draped sterilely, brought into the field under the C-arm, then the interspinous places above L5 and below L5 and L5-S1 were identified.  These were then divided using electrocautery, marked for continued identification. Exposure was continued out laterally resecting the facets, capsule at the L5-S1 level preserving capsules at L4-5, L3-4, and L2-3 if necessary.  Exposure continued out over the sacral ala on both sides and then deep exposing the transverse process of L5 bilaterally and then dividing soft tissue out laterally over the facets and preserving facet capsules at L4-5, L3-4, and L2-3 obtaining lateral exposure for convergence of screws.  Viper retractor was placed.  Bleeders were again controlled using electrocautery.  The lateral areas and posterolateral areas were then packed with sponges.  Leksell rongeur was then used to resect spinous process of L5, the inferior half of the spinous process of L4, and superior third of the spinous process of S1.  Bone graft later used in morselized fashion for bone grafting purposes.  The central portions of lamina on the base of the spinous process of L5 were carefully resected using Leksell rongeur. Capsules of 5-1 level were resected as noted before.  Grasping the neural arch using the Leksell rongeur.  The neural arch was carefully resected.  Osteotomes were then used to resect the inferior articular process bilaterally saving bone for later bone  grafting purposes.  3 and 4 mm Kerrisons were then used to resect the ligamentum flavum off the inferior aspect of lamina of L4 bilaterally and lateral recesses at the L4-5 level were decompressed using D'Errico retractor to protect thecal sac and the L5 nerve roots and then carefully resecting hypertrophic ligamentum flavum over lateral recess at L4-5 bilaterally decompressing the 5 roots as the exiting out the L5 neural foramen.  The ligamentum flavum at the L5-S1 level was then carefully freed off the thecal sac and resected centrally using 3 and 4 mm Kerrisons and then off the superior aspect lamina of S1 and then foraminotomy was performed over both S1 nerve roots.  Exposing the pedicle at the S1 level, osteotomes were then used to resect medial aspect of the superior reticular process of S1 bilaterally at the medial border of the pedicle of S1 bilaterally. 3 mm Kerrisons used to resect this then after osteotomizing.  The superior margin of the S1 pedicle identified using a hockey stick neural probe and osteotomy performed first on the left side than on the side and  then bone was then debrided out into the neural foramen bilaterally using 3 mm Kerrisons, Beyer rongeur, and [employer] as necessary. Residual portions of the pars area and the lamina were then resected over the L5 nerve root bilaterally using 4 and 3 mm Kerrisons.  Hockey stick neural probe was passed then out the neural foramen identifying the patency and complete decompression was carried out.  Bleeders were controlled using bipolar electrocautery.  Loupe magnification headlamp was used during this portion of procedure.  Attention was then turned to performing pedicle screw and rod fixation so that the C-arm fluoro was brought into field.  Using C-arm fluoro, then an awl was used to make an entry point over the lateral aspect of the pedicle of L5 at its intersection with the transverse process of L5 on the right  side.  Small bur was used to bur a small portion of the lateral aspect of the pedicle as well as portion of the superior articular process to allow for the entry point exposure.  Handheld pedicle probe was then used to probe the pedicle to the depth of about 45 mm.  This was carefully checked with a ball-tipped probe to ensure patency of the opening into the pedicle and no sign of broaching of pedicle cortex.  Tapping performed with a 6-mm tap.  Observed on C-arm fluoro to be good position and alignment.  Again checking with ball-tipped probe, a 45-mm x 7-mm screw was chosen and decortication was carried out of the transverse process.  Local bone graft that had been harvested was then placed over the transverse process.  The 7-mm x 45-mm screw was then placed using correct degree of convergence into the pedicle on the right side of L5.  The ala was also carefully exposed and the opening was made in the mid part of the lateral aspect of the superior reticular process of S1 on the right side using an awl, identifying this spot on the lateral C-arm to be the correct position and alignment for insertion of the S1 screw.  A pedicle probe was then used to probe the pedicle to the depth of about 35 mm. Ball-tipped probe was then used to check this to ensure there was cortex over the deep aspect of the insertion site indicating the anterior cortex of the sacrum and that there was no penetration of the cortical bone at the S1 pedicle level.  None was found to be present.  The superficial cortex at the S1 level was then tapped using a 6.0 tap.  An 8-mm x 35-mm screw was chosen.  Then, using the correct degree of convergence, lordosis of screw was inserted, obtained excellent purchase on the right side at the S1 level.  Note that decortication was carried out prior to insertion of screw and additional local bone graft applied over the lateral aspect of sacral ala and bridge in between the ala  and the transverse process of 5 on the right side.  So, we inserted the screw fasteners and carefully loosened use of the looser provided.  35 mm precontoured rod was then inserted in the fastener heads and temporary fixation.  Temporary caps were then placed here.  Attention was then turned to the left side where similarly pedicle screws were then placed at the L5-S1 level first at L5 carefully exposing the transverse process of L5 on the lateral aspect of the pedicle at its intersection with transverse process.  An awl was used to make an entry point of  the lateral aspect of the pedicle using C-arm fluoro to identify this entry point.  A hand pedicle probe was then used to probe the pedicle channel into the L5 pedicle.  This was then checked with a ball-tipped probe to ensure patency and no broaching of cortex circumferentially.  Tapping was then carried out with 6.0 tap after identifying the depth at about 45 mm as on the opposite side.  After tapping again checking with ball-tipped probe, decortication of the transverse process was carried out, local bone graft applied, then 45-mm x 7-mm screw inserted at the left L5, pedicles checked on C-arm to be in excellent position and alignment.  Finally, one screw was placed and this was placed about the midportion of the patient's superior reticular process residual over the posterior aspect of the sacrum and lateral to it.  An awl was used to make an entry point, identified on C-arm fluoro to be correct position and alignment and then a pedicle probe was then used to probe pedicle to the depth of 35 mm.  An 8-mm x 35-mm screw was chosen.  Tapping just superficial cortex with a 6.0 tap.  Decortication of the ala was carried out and additional local bone graft was placed prior to the placement of screw at the S1 level on the left side with correct degree of convergence and lordosis checking with the C-arm fluoro and it was in correct position  and alignment.  A 35-mm precontoured rod was then placed into the fasteners after first loosening of the fastener, the screw shank area was loose and provided. Once these were in place, then temporary cap fixation was placed. Careful exposure on the left side then retracting the S1 nerve root medially and the 5 nerve root superiorly.  Large veins were identified, cauterized using bipolar electrocautery.  Posterior aspect of the disk at L5-S1 was then identified on the left.  A #15 blade was used to remove a window of annular material gaining entry into the disk space. Pituitary rongeurs were used to initially debride the disk space and a 7- mm dilator was inserted into the disk space and used to dilate the disk space at about 30 mm depth and 8, 9, and 10-mm dilators were then used to dilate the disk space fully, 10 was the best that could be fitted into this area.  Pituitary rongeurs were then used to debride the disk space and degenerated loose disk material.  Curettage was then carried out of the endplates of the superior aspect of S1 over the inferior aspect of L5 and through this opening in the annulus of the left transforaminal area.  Note that the first straight curettes and upbiting right and upbiting left curettes were used to debride the disk space and degenerated disk material as well as cartilaginous endplate material and these were debrided using pituitary rongeurs.  Finally, ring curettes, straight and upbiting were used to further debride the disk space and degenerative disk material down to bleeding bone surfaces and bone endplates.  Final check of the disk space using the serrated curette indicated that all of the degenerative disk that was possible was removed as best as possible.  Trial was then carried out, a 9-mm trial provided best fit, unable to insert a 10 as the disk space was quite tight here.  A 9 lordotic cage was chosen using DePuy instrumentation. The bone  graft that was obtained locally was then packed in the intervertebral disk space as well as into the  cage.  The bone graft that was placed into the disk space was additionally impacted twice using a 7- mm cage to further fill the intervertebral disk space prior to the insertion of the cage.  Cage still filled, 9-mm lordotic cage was then inserted via the left transforaminal root and the correct degree of convergence about 30-35 degrees and then impacted into place, set beneath the posterior aspect of the disk space by about 3-4 mm. Inserting the rod was removed.  The cage was noted to be in good position and alignment, subset well beneath the posterior aspect of the disk space margin.  With this, then the fastener to the rod on the left side was then carefully tightened to 80 foot-pounds at the S1 level and then compression obtained between the left L5 fastener and the S1 fastener and the fastener cap at the L5 levels and tightened the 80 foot- pounds, locking this in place.  Care was taken to ensure that the rod had engaged and provide three-point fixation at both L5 and S1 adequately.  On the right side, the caps were carefully loosened and the cap at the S1 level was then tightened to 80 foot-pounds allowing enough rods to protrude past the fastener to give fixation.  Compression then obtained between the L5 fastener and the S1 fastener on the right and the cap at the right L5 level was then tightened to 80 foot-pounds. This provided full fixation on both sides.  Permanent C-arm images were obtained in AP, lateral, and oblique documenting the fixation on both anteriorly and posteriorly.  Irrigation was carried out.  Further bone graft was then placed posterolaterally on both sides as much as possible, any residual that could not be packed in this area was then placed over the sacrum just below the S1 pedicle screw location.  With this, then carefully devitalized tissue resected  bilaterally over the muscle areas were necessary.  Viper retractors were removed prior to this.  A medium Hemovac drain placed in depth of the incision exiting over the left side.  Small amounts of Gelfoam were placed over the posterior laminotomy site and hockey stick neural probe was used first to probe bilateral S1-L5 neural foramen ensuring their patency and laterally recessed as well decompressing at the 4-5 level.  With this, then the paralumbar muscles were carefully approximated in the midline with interrupted #1 Vicryl sutures loosely.  Lumbodorsal fascia was reapproximated to the spinous process and interspinous ligaments at L3- L4 and at the S1, S2, and S3 levels.  The deep subcu layers were approximated with interrupted #1-0 Vicryl sutures, more superficial layers with interrupted 2-0 Vicryl sutures, and the skin was closed with a running subcu stitch of 4-0 Vicryl.  Dermabond was then applied and Mepilex bandage.  The patient was then returned to a supine position on the stretcher, reactivated, extubated, and returned to the recovery room in satisfactory condition.  All, instrument and sponge counts were correct.  PHYSICIAN ASSISTANT'S RESPONSIBILITY:  Maud Deed performed duties of Designer, television/film set.  She provided careful retraction of neural structures, neural elements of the L5-S1 level during the decompression phase of the procedure as well as during the placement of transforaminal lumbar interbody fusion.  She assisted in the instrumentation and careful attachment of the rods to the fasteners bilaterally.  She assisted in bone grafting and the harvesting of bone graft during the procedure and bone grafting of the posterolateral elements at the end of the procedure.  She additionally was present  from beginning of the case to the end of the case.  She performed closure of the incision from fascia to the skin and assisted in the transfer of the patient from the bed to  the patient's stretcher postoperatively.     Kerrin Champagne, M.D.     JEN/MEDQ  D:  05/06/2011  T:  05/06/2011  Job:  [id number]  Electronically Signed by Vira Browns M.D. on 05/21/2011 06:18:56 PM

## 2011-05-21 NOTE — Discharge Summary (Signed)
Luis Poole, LUKINS NO.:  [id number]  MEDICAL RECORD NO.:  [id number]  LOCATION:  5011                         FACILITY:  [facility]  PHYSICIAN:  Kerrin Champagne, M.D.   DATE OF BIRTH:  [birth year]  DATE OF ADMISSION:  05/06/2011 DATE OF DISCHARGE:  05/16/2011                              DISCHARGE SUMMARY   ADMISSION DIAGNOSES: 1. Painful L5-S1 spondylolisthesis with bilateral lower extremity     weakness and radiculopathy at the L5 distribution. 2. Diabetes mellitus, diet controlled. 3. Hypertension. 4. Gout. 5. Left rotator cuff tear. 6. Status post anterior cervical diskectomy and fusion C5-6 and C6-7. 7. Status post left total knee replacement and revision. 8. Bilateral cataracts.  DISCHARGE DIAGNOSES: 1. Painful L5-S1 spondylolisthesis with bilateral lower extremity     weakness and radiculopathy at L5 distribution with grade 2     spondylolisthesis. 2. Acute cerebrovascular accident with MRI findings of acute infarct,     right posterior body and splenium of corpus callosum with watershed     distribution.  Bilateral upper and lower extremity weakness. 3. Diabetes mellitus. 4. Hypertension. 5. Gout. 6. Left rotator cuff tear. 7. Anterior cervical diskectomy and fusion C5-6 and C6-7. 8. Status post left knee replacement with revision. 9. Bilateral cataracts.  PROCEDURES:  On May 06, 2011, the patient underwent Gill procedure with removal of central lamina and pars area as well as inferior articular processes of bilateral L5.  Transforaminal lumbar interbody fusion left L5-S1 with lordotic cage and local bone graft.  L5-S1 pedicle screw and rod fixation with posterolateral fusion L5-S1 utilizing local bone graft.  This was performed by Dr. Otelia Sergeant, assisted by Maud Deed, PA-C, under general anesthesia.  CONSULTATIONS:  Dr. Marjory Lies, Dr. Pearlean Brownie, Dr. Riley Kill of Physical Medicine and Rehabilitation.  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  He had pain typically and L5 distribution bilaterally, left greater than right. This did seem to change from time to time with the patient complaining of right-sided pain preoperatively greater than left.  The patient was noted to have spondylolisthesis, grade 2 at the L5-S1 level.  MRI studies also noted a bilateral pars defect at L5.  Also bilateral foraminal narrowing noted at the L5-S1 level.  The patient had been treated with epidural steroid injections as well as narcotic analgesics and activity modification.  He is no longer receiving any relief of his symptoms with these modalities.  It was felt he would be a candidate for inpatient surgery.  He was admitted for the procedure as stated above.  BRIEF HOSPITAL COURSE:  The patient tolerated the procedure under general anesthesia without complications.  Intraoperatively, he had a 400 mL blood loss with 125 mL return through the Cell Sa[employer]. Postoperatively on the first day, neurovascular motor function of the lower extremities remained intact with some numbness in the left L5 distribution.  The patient was fitted with an Aspen LSO in anticipation of physical therapy.  Rehab consult was obtained to discuss possibilities for rehab at discharge.  Physical therapy was initiated on  the first postoperative day.  He did participate with physical therapy on the first day; however, with attempts at physical therapy on the second day, the patient began having weakness in both his upper extremities and lower extremities.  He had difficulty with balance and required total assistance with the therapist for activity even with bed to chair transfers.  He also was noted to have incontinence of urine. Flomax was used for his urinary incontinence.  The patient complained of a sore throat and  Cepacol lozenges were utilized.  Late in the day on May 08, 2011, the patient was evaluated for bilateral upper and lower extremity weakness.  He was alert and oriented at that time and denied any facial weakness or numbness.  He denied neck pain.  He also denied numbness in the upper extremities, although they were significantly weak.  The patient reported that he had a little use of the left shoulder due to his rotator cuff repair previously.  He was sent for CT of the lumbar spine as well as CT of the brain.  CT of the lumbar spine showing hardware to be in good position from his surgery. CT of the brain was negative for acute changes.  Neurology was called to evaluate the patient.  MRI was ordered to evaluate the brain once again. The patient was unable to complete the MRI secondary to severe claustrophobia.  Later in the following day, he did agree to the MRI and it was performed showing findings consistent with the acute right frontal watershed infarct and right splenium of corpus callosum on the right.  It was recommended that his a blood pressure be maintained with systolic pressures 120-140 and aspirin be initiated when he was safe to do so postoperatively.  Carotid Doppler studies were performed showing no ICA stenosis.  A 2-D echo was performed showing no significant abnormal findings.  Ejection fraction 75%.  Swallowing study was evaluated and the patient had no indications for oral or oropharyngeal dysphagia.  No followup was recommended.  The patient was continued on a low-carbohydrate diet.  The patient continued to work with physical therapy.  He continued to have fairly profound weakness in the left foot and ankle.  He is being evaluated for possible need for AFO to the left lower extremity.  Otherwise, his cranial nerves are intact.  The patient had been performing transfers, standing balance, and gait training.  An Aspen LSO is utilized when he is out of bed.  As  the patient has stabilized, it was felt that he would need nursing home placement for rehabilitation as he is not a candidate for inpatient rehab.  On May 16, 2011, he was felt stable.  There was difficulty with CT angiogram of the head and neck as there was difficulty in finding IV access.  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 Mild irregularity of the basilar artery without focal stenosis.  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  This was reviewed by the neurologist and it was felt that the patient was stable for transfer to nursing facility with continuation of physical therapy and occupational therapy.  Also, it was recommended that he start aspirin.  He was not felt to be a candidate for  CEA or stent placement at the current time.  With the assistance of the case manager and Child psychotherapist, a bed was available at [facility].  The patient was able to be transferred on May 16, 2011, in stable condition.  At that time, he was taking a regular diet. The patient was voiding with occasional incontinence and adult diapers utilize.  He was having bowel movements.  Pain was well controlled from his lumbar surgery with oral analgesics.  He was alert and oriented x3. The patient was in agreement for transfer to the nursing facility. Other pertinent laboratory values, on admission to the hospital, the patient's hemoglobin 12.9 with hematocrit 37.5.  Hemoglobin was stable around 8.5-8.6 during the hospital stay and he did not require any further blood transfusion.  Coagulation studies on admission were within normal  limits.  Blood sugars were monitored throughout the hospital stay and remained below 200 during the hospital stay.  Preoperative chemistry studies were within normal limits.  Urinalysis on admission was negative for urinary tract infection.  Repeat urinalysis on May 08, 2011, was negative and a culture showed no growth.  The patient's chemistry studies were monitored throughout the hospital stay.  He had 2 episodes of hyponatremia, which were mild.  Otherwise, renal function and other values remained within normal limits.  Radiographic studies done during this hospital stay are as listed above including a CT of the head without contrast, CT of the lumbar spine without contrast, MRI of the brain without contrast, CT angiogram of the head and neck.  These results are above in the discharge summary.  PLAN:  The patient will be discharged to [facility]. There, he should continue to receive physical therapy for progressive ambulation and gait trainings.  He should wear his Aspen LSO anytime he is out of bed.  He may have this off only when he is lying down.  His progressive ambulation will also require a walker.  Occupational therapy to perform ADLs with the patient's.  The patient does anticipate full recovery and return to home at an independent state.  At the current time, the patient is being assessed for an AFO to the left lower extremity.  We will ask [employer] to fit the patient for this and deliver to the nursing facility when it is available.  The patient's wound to the lumbar spine should be kept dry and clean except for bathing or showering purposes.  Dressing should be changed on a daily basis.  The patient should remain on a low-carb modified diet as well as antigout diet.  He will follow up in 2 weeks with Dr. Otelia Sergeant.  Arrangements for this appointment may be made by calling [phone].  The patient should follow up with Dr. Pearlean Brownie in 2 months.  MEDICATIONS  AT DISCHARGE: 1. Colace one p.o. b.i.d. 2. Allopurinol 300 mg daily. 3. Colchicine 0.6 mg daily as needed for gout-related symptoms. 4. Norvasc 10 mg daily. 5. Triamterene/hydrochlorothiazide 37.5/25 p.o. daily. 6. Lisinopril 40 mg daily. 7. Catapres-TTS three patch q.7 days. 8. Flomax 0.4 mg daily. 9. Protonix 40 mg daily. 10.Aspirin 325 mg daily. 11.Zocor 20 mg daily. 12.Ferrous sulfate 325 mg p.o. b.i.d. 13.Acetaminophen 325 mg to 650 mg q.4 h. p.r.n. pain. 14.Robaxin 500 mg one every 8 hours as needed for spasm. 15.Hydrocodone 5/325 one to two every 4-6 hours as needed for pain. 16.Chloraseptic spray or Cepacol lozenges as needed for sore throat. The patient has been on a steroid  Dosepak which started on May 16, 2011.  This was Sterapred DS 10 mg.  The stop date is May 19, 2011.  This should be continued and completed at the facility.  He has been on a sliding scale insulin; however, his blood sugars have been stable and he has not needed injections of insulin.  If there are questions or concerns regarding patient's orthopedic care, please notify Dr. Barbaraann Faster office at [phone].  All questions encouraged and answered.  CONDITION ON DISCHARGE:  Stable.     Wende Neighbors, P.A.   ______________________________ Kerrin Champagne, M.D.    SMV/MEDQ  D:  05/16/2011  T:  05/16/2011  Job:  [id number]  Electronically Signed by Dorna Mai. on 05/21/2011 04:17:10 PM Electronically Signed by Vira Browns M.D. on 05/21/2011 06:20:16 PM

## 2011-05-23 ENCOUNTER — Inpatient Hospital Stay (HOSPITAL_COMMUNITY)
Admission: EM | Admit: 2011-05-23 | Discharge: 2011-06-03 | DRG: 643 | Disposition: A | Discharge status: Skilled Nursing Facility | Payer: Medicare HMO | Attending: Internal Medicine | Admitting: Internal Medicine

## 2011-05-23 ENCOUNTER — Emergency Department (HOSPITAL_COMMUNITY): Payer: Medicare HMO

## 2011-05-23 DIAGNOSIS — R5381 Other malaise: Secondary | ICD-10-CM | POA: Diagnosis present

## 2011-05-23 DIAGNOSIS — N400 Enlarged prostate without lower urinary tract symptoms: Secondary | ICD-10-CM | POA: Diagnosis present

## 2011-05-23 DIAGNOSIS — R4182 Altered mental status, unspecified: Secondary | ICD-10-CM

## 2011-05-23 DIAGNOSIS — T502X5A Adverse effect of carbonic-anhydrase inhibitors, benzothiadiazides and other diuretics, initial encounter: Secondary | ICD-10-CM | POA: Diagnosis present

## 2011-05-23 DIAGNOSIS — M4310 Spondylolisthesis, site unspecified: Secondary | ICD-10-CM | POA: Diagnosis present

## 2011-05-23 DIAGNOSIS — G8929 Other chronic pain: Secondary | ICD-10-CM | POA: Diagnosis present

## 2011-05-23 DIAGNOSIS — Z7982 Long term (current) use of aspirin: Secondary | ICD-10-CM

## 2011-05-23 DIAGNOSIS — E785 Hyperlipidemia, unspecified: Secondary | ICD-10-CM | POA: Diagnosis present

## 2011-05-23 DIAGNOSIS — E871 Hypo-osmolality and hyponatremia: Secondary | ICD-10-CM

## 2011-05-23 DIAGNOSIS — E119 Type 2 diabetes mellitus without complications: Secondary | ICD-10-CM | POA: Diagnosis present

## 2011-05-23 DIAGNOSIS — IMO0002 Reserved for concepts with insufficient information to code with codable children: Secondary | ICD-10-CM

## 2011-05-23 DIAGNOSIS — K219 Gastro-esophageal reflux disease without esophagitis: Secondary | ICD-10-CM | POA: Diagnosis present

## 2011-05-23 DIAGNOSIS — Y92009 Unspecified place in unspecified non-institutional (private) residence as the place of occurrence of the external cause: Secondary | ICD-10-CM

## 2011-05-23 DIAGNOSIS — N179 Acute kidney failure, unspecified: Secondary | ICD-10-CM | POA: Diagnosis present

## 2011-05-23 DIAGNOSIS — M109 Gout, unspecified: Secondary | ICD-10-CM | POA: Diagnosis present

## 2011-05-23 DIAGNOSIS — D638 Anemia in other chronic diseases classified elsewhere: Secondary | ICD-10-CM | POA: Diagnosis present

## 2011-05-23 DIAGNOSIS — Z79899 Other long term (current) drug therapy: Secondary | ICD-10-CM

## 2011-05-23 DIAGNOSIS — E236 Other disorders of pituitary gland: Principal | ICD-10-CM | POA: Diagnosis present

## 2011-05-23 DIAGNOSIS — I69959 Hemiplegia and hemiparesis following unspecified cerebrovascular disease affecting unspecified side: Secondary | ICD-10-CM

## 2011-05-23 DIAGNOSIS — D509 Iron deficiency anemia, unspecified: Secondary | ICD-10-CM | POA: Diagnosis present

## 2011-05-23 DIAGNOSIS — G9341 Metabolic encephalopathy: Secondary | ICD-10-CM | POA: Diagnosis present

## 2011-05-23 DIAGNOSIS — I10 Essential (primary) hypertension: Secondary | ICD-10-CM | POA: Diagnosis present

## 2011-05-23 DIAGNOSIS — Z981 Arthrodesis status: Secondary | ICD-10-CM

## 2011-05-23 LAB — URINALYSIS, ROUTINE W REFLEX MICROSCOPIC
Glucose, UA: NEGATIVE mg/dL
Ketones, ur: NEGATIVE mg/dL
Leukocytes, UA: NEGATIVE
Nitrite: NEGATIVE
Specific Gravity, Urine: 1.014 (ref 1.005–1.030)
pH: 5 (ref 5.0–8.0)

## 2011-05-23 LAB — PRO B NATRIURETIC PEPTIDE: Pro B Natriuretic peptide (BNP): 88.1 pg/mL (ref 0–125)

## 2011-05-23 LAB — BASIC METABOLIC PANEL
BUN: 20 mg/dL (ref 6–23)
BUN: 19 mg/dL (ref 6–23)
BUN: 18 mg/dL (ref 6–23)
CO2: 20 mEq/L (ref 19–32)
Calcium: 8.6 mg/dL (ref 8.4–10.5)
Chloride: 81 mEq/L — ABNORMAL LOW (ref 96–112)
Chloride: 83 mEq/L — ABNORMAL LOW (ref 96–112)
Creatinine, Ser: 1.18 mg/dL (ref 0.50–1.35)
GFR calc Af Amer: 62 mL/min — ABNORMAL LOW (ref >90)
GFR calc Af Amer: 67 mL/min — ABNORMAL LOW (ref >90)
GFR calc Af Amer: 71 mL/min — ABNORMAL LOW (ref >90)
GFR calc non Af Amer: 54 mL/min — ABNORMAL LOW (ref >90)
Glucose, Bld: 100 mg/dL — ABNORMAL HIGH (ref 70–99)
Potassium: 4.3 mEq/L (ref 3.5–5.1)
Sodium: 112 mEq/L — CL (ref 135–145)
Sodium: 114 mEq/L — CL (ref 135–145)

## 2011-05-23 LAB — COMPREHENSIVE METABOLIC PANEL
Albumin: 3.3 g/dL — ABNORMAL LOW (ref 3.5–5.2)
Alkaline Phosphatase: 69 U/L (ref 39–117)
BUN: 23 mg/dL (ref 6–23)
Calcium: 8.8 mg/dL (ref 8.4–10.5)
GFR calc Af Amer: 56 mL/min — ABNORMAL LOW (ref >90)
Glucose, Bld: 99 mg/dL (ref 70–99)
Potassium: 4.8 mEq/L (ref 3.5–5.1)
Total Protein: 7.5 g/dL (ref 6.0–8.3)

## 2011-05-23 LAB — CBC
MCH: 28 pg (ref 26.0–34.0)
MCHC: 36.1 g/dL — ABNORMAL HIGH (ref 30.0–36.0)
RDW: 12.8 % (ref 11.5–15.5)

## 2011-05-23 LAB — GLUCOSE, CAPILLARY: Glucose-Capillary: 98 mg/dL (ref 70–99)

## 2011-05-23 LAB — TSH: TSH: 1.616 u[IU]/mL (ref 0.350–4.500)

## 2011-05-23 LAB — OSMOLALITY, URINE: Osmolality, Ur: 184 mOsm/kg — ABNORMAL LOW (ref 390–1090)

## 2011-05-23 LAB — POCT I-STAT, CHEM 8
Glucose, Bld: 102 mg/dL — ABNORMAL HIGH (ref 70–99)
HCT: 28 % — ABNORMAL LOW (ref 39.0–52.0)
Hemoglobin: 9.5 g/dL — ABNORMAL LOW (ref 13.0–17.0)
Potassium: 4.8 mEq/L (ref 3.5–5.1)
Sodium: 111 mEq/L — CL (ref 135–145)
TCO2: 20 mmol/L (ref 0–100)

## 2011-05-23 LAB — DIFFERENTIAL
Eosinophils Absolute: 0.1 10*3/uL (ref 0.0–0.7)
Eosinophils Relative: 1 % (ref 0–5)
Lymphs Abs: 1.6 10*3/uL (ref 0.7–4.0)

## 2011-05-23 LAB — SODIUM, URINE, RANDOM: Sodium, Ur: <10 mEq/L

## 2011-05-23 LAB — CORTISOL: Cortisol, Plasma: 10.4 ug/dL

## 2011-05-24 ENCOUNTER — Inpatient Hospital Stay (HOSPITAL_COMMUNITY): Payer: Medicare HMO

## 2011-05-24 LAB — URINALYSIS, ROUTINE W REFLEX MICROSCOPIC
Glucose, UA: NEGATIVE mg/dL
Hgb urine dipstick: NEGATIVE
pH: 6 (ref 5.0–8.0)

## 2011-05-24 LAB — BASIC METABOLIC PANEL
BUN: 17 mg/dL (ref 6–23)
BUN: 16 mg/dL (ref 6–23)
BUN: 14 mg/dL (ref 6–23)
CO2: 21 mEq/L (ref 19–32)
CO2: 22 mEq/L (ref 19–32)
Calcium: 8.5 mg/dL (ref 8.4–10.5)
Calcium: 8.4 mg/dL (ref 8.4–10.5)
Chloride: 91 mEq/L — ABNORMAL LOW (ref 96–112)
Creatinine, Ser: 1.09 mg/dL (ref 0.50–1.35)
Creatinine, Ser: 1.1 mg/dL (ref 0.50–1.35)
Creatinine, Ser: 0.98 mg/dL (ref 0.50–1.35)
GFR calc Af Amer: >90 mL/min (ref >90)
GFR calc non Af Amer: 83 mL/min — ABNORMAL LOW (ref >90)
Glucose, Bld: 99 mg/dL (ref 70–99)
Glucose, Bld: 125 mg/dL — ABNORMAL HIGH (ref 70–99)
Glucose, Bld: 104 mg/dL — ABNORMAL HIGH (ref 70–99)

## 2011-05-24 LAB — GLUCOSE, CAPILLARY
Glucose-Capillary: 100 mg/dL — ABNORMAL HIGH (ref 70–99)
Glucose-Capillary: 107 mg/dL — ABNORMAL HIGH (ref 70–99)
Glucose-Capillary: 112 mg/dL — ABNORMAL HIGH (ref 70–99)
Glucose-Capillary: 119 mg/dL — ABNORMAL HIGH (ref 70–99)

## 2011-05-24 LAB — DIFFERENTIAL
Eosinophils Absolute: 0.1 10*3/uL (ref 0.0–0.7)
Eosinophils Relative: 3 % (ref 0–5)
Lymphocytes Relative: 42 % (ref 12–46)
Lymphs Abs: 2.4 10*3/uL (ref 0.7–4.0)
Monocytes Absolute: 0.3 10*3/uL (ref 0.1–1.0)

## 2011-05-24 LAB — CBC
HCT: 24.1 % — ABNORMAL LOW (ref 39.0–52.0)
HCT: 37.5 % — ABNORMAL LOW (ref 39.0–52.0)
MCH: 28 pg (ref 26.0–34.0)
MCHC: 32.7 g/dL (ref 30.0–36.0)
MCV: 79.3 fL (ref 78.0–100.0)
MCV: 83.3 fL (ref 78.0–100.0)
Platelets: 446 10*3/uL — ABNORMAL HIGH (ref 150–400)
Platelets: 205 10*3/uL (ref 150–400)
RBC: 3.04 MIL/uL — ABNORMAL LOW (ref 4.22–5.81)

## 2011-05-24 LAB — HEMOGLOBIN A1C
Hgb A1c MFr Bld: 6.3 % — ABNORMAL HIGH (ref 4.6–6.1)
Mean Plasma Glucose: 134 mg/dL

## 2011-05-24 LAB — COMPREHENSIVE METABOLIC PANEL
ALT: 13 U/L (ref 0–53)
AST: 19 U/L (ref 0–37)
Albumin: 3.6 g/dL (ref 3.5–5.2)
CO2: 29 mEq/L (ref 19–32)
Chloride: 105 mEq/L (ref 96–112)
Creatinine, Ser: 0.94 mg/dL (ref 0.4–1.5)
GFR calc Af Amer: >60 mL/min (ref >60)
GFR calc non Af Amer: >60 mL/min (ref >60)
Sodium: 141 mEq/L (ref 135–145)
Total Bilirubin: 0.7 mg/dL (ref 0.3–1.2)

## 2011-05-24 LAB — URINE CULTURE
Colony Count: NO GROWTH
Culture  Setup Time: 201210040946

## 2011-05-24 LAB — PROTIME-INR: Prothrombin Time: 13.6 seconds (ref 11.6–15.2)

## 2011-05-25 LAB — GLUCOSE, CAPILLARY: Glucose-Capillary: 117 mg/dL — ABNORMAL HIGH (ref 70–99)

## 2011-05-25 LAB — BASIC METABOLIC PANEL
CO2: 21 mEq/L (ref 19–32)
Calcium: 8.4 mg/dL (ref 8.4–10.5)
Chloride: 94 mEq/L — ABNORMAL LOW (ref 96–112)
Potassium: 3.9 mEq/L (ref 3.5–5.1)
Sodium: 123 mEq/L — ABNORMAL LOW (ref 135–145)

## 2011-05-26 LAB — CBC
HCT: 20 % — ABNORMAL LOW (ref 39.0–52.0)
MCHC: 35 g/dL (ref 30.0–36.0)
MCV: 81.3 fL (ref 78.0–100.0)
RDW: 13.5 % (ref 11.5–15.5)

## 2011-05-26 LAB — DIFFERENTIAL
Basophils Absolute: 0 10*3/uL (ref 0.0–0.1)
Eosinophils Absolute: 0.1 10*3/uL (ref 0.0–0.7)
Eosinophils Relative: 2 % (ref 0–5)
Lymphocytes Relative: 26 % (ref 12–46)
Monocytes Absolute: 0.4 10*3/uL (ref 0.1–1.0)

## 2011-05-26 LAB — BASIC METABOLIC PANEL
BUN: 7 mg/dL (ref 6–23)
Creatinine, Ser: 0.9 mg/dL (ref 0.50–1.35)
GFR calc Af Amer: >90 mL/min (ref >90)
GFR calc non Af Amer: 85 mL/min — ABNORMAL LOW (ref >90)
Potassium: 3.8 mEq/L (ref 3.5–5.1)

## 2011-05-27 LAB — URINALYSIS, ROUTINE W REFLEX MICROSCOPIC
Bilirubin Urine: NEGATIVE
Ketones, ur: NEGATIVE mg/dL
Nitrite: NEGATIVE
Protein, ur: NEGATIVE mg/dL
pH: 6 (ref 5.0–8.0)

## 2011-05-27 LAB — BASIC METABOLIC PANEL
CO2: 25 mEq/L (ref 19–32)
Calcium: 8.6 mg/dL (ref 8.4–10.5)
Creatinine, Ser: 0.93 mg/dL (ref 0.50–1.35)
Glucose, Bld: 103 mg/dL — ABNORMAL HIGH (ref 70–99)
Sodium: 127 mEq/L — ABNORMAL LOW (ref 135–145)

## 2011-05-27 LAB — CBC
HCT: 19.7 % — ABNORMAL LOW (ref 39.0–52.0)
Hemoglobin: 6.8 g/dL — CL (ref 13.0–17.0)
MCH: 28.2 pg (ref 26.0–34.0)
MCV: 81.7 fL (ref 78.0–100.0)
RBC: 2.41 MIL/uL — ABNORMAL LOW (ref 4.22–5.81)

## 2011-05-27 LAB — RENAL FUNCTION PANEL
Albumin: 2.4 g/dL — ABNORMAL LOW (ref 3.5–5.2)
Calcium: 8.5 mg/dL (ref 8.4–10.5)
GFR calc Af Amer: >90 mL/min (ref >90)
Phosphorus: 3.7 mg/dL (ref 2.3–4.6)
Potassium: 3.8 mEq/L (ref 3.5–5.1)
Sodium: 127 mEq/L — ABNORMAL LOW (ref 135–145)

## 2011-05-27 LAB — PREPARE RBC (CROSSMATCH)

## 2011-05-27 LAB — DIFFERENTIAL
Basophils Relative: 1 % (ref 0–1)
Eosinophils Relative: 3 % (ref 0–5)
Lymphocytes Relative: 27 % (ref 12–46)
Monocytes Relative: 7 % (ref 3–12)
Neutro Abs: 2.8 10*3/uL (ref 1.7–7.7)

## 2011-05-27 LAB — URINE MICROSCOPIC-ADD ON

## 2011-05-27 LAB — HEMOGLOBIN AND HEMATOCRIT, BLOOD
HCT: 23.7 % — ABNORMAL LOW (ref 39.0–52.0)
Hemoglobin: 8.3 g/dL — ABNORMAL LOW (ref 13.0–17.0)

## 2011-05-28 LAB — DIFFERENTIAL
Basophils Absolute: 0 10*3/uL (ref 0.0–0.1)
Basophils Relative: 0 % (ref 0–1)
Lymphocytes Relative: 26 % (ref 12–46)
Monocytes Absolute: 0.3 10*3/uL (ref 0.1–1.0)
Monocytes Relative: 5 % (ref 3–12)
Neutro Abs: 3.6 10*3/uL (ref 1.7–7.7)
Neutrophils Relative %: 67 % (ref 43–77)

## 2011-05-28 LAB — BASIC METABOLIC PANEL
BUN: 9 mg/dL (ref 6–23)
CO2: 25 mEq/L (ref 19–32)
Glucose, Bld: 103 mg/dL — ABNORMAL HIGH (ref 70–99)
Potassium: 3.9 mEq/L (ref 3.5–5.1)
Sodium: 126 mEq/L — ABNORMAL LOW (ref 135–145)

## 2011-05-28 LAB — CBC
HCT: 24.7 % — ABNORMAL LOW (ref 39.0–52.0)
Hemoglobin: 8.8 g/dL — ABNORMAL LOW (ref 13.0–17.0)
MCHC: 35.6 g/dL (ref 30.0–36.0)
RBC: 3.03 MIL/uL — ABNORMAL LOW (ref 4.22–5.81)

## 2011-05-29 LAB — GLUCOSE, CAPILLARY
Glucose-Capillary: 108 mg/dL — ABNORMAL HIGH (ref 70–99)
Glucose-Capillary: 117 mg/dL — ABNORMAL HIGH (ref 70–99)
Glucose-Capillary: 111 mg/dL — ABNORMAL HIGH (ref 70–99)
Glucose-Capillary: 104 mg/dL — ABNORMAL HIGH (ref 70–99)
Glucose-Capillary: 112 mg/dL — ABNORMAL HIGH (ref 70–99)
Glucose-Capillary: 101 mg/dL — ABNORMAL HIGH (ref 70–99)
Glucose-Capillary: 105 mg/dL — ABNORMAL HIGH (ref 70–99)
Glucose-Capillary: 82 mg/dL (ref 70–99)
Glucose-Capillary: 115 mg/dL — ABNORMAL HIGH (ref 70–99)
Glucose-Capillary: 97 mg/dL (ref 70–99)

## 2011-05-29 LAB — CBC
HCT: 25.7 % — ABNORMAL LOW (ref 39.0–52.0)
Hemoglobin: 8.7 g/dL — ABNORMAL LOW (ref 13.0–17.0)
MCH: 28.2 pg (ref 26.0–34.0)
MCHC: 33.9 g/dL (ref 30.0–36.0)
MCV: 83.2 fL (ref 78.0–100.0)
RBC: 3.09 MIL/uL — ABNORMAL LOW (ref 4.22–5.81)

## 2011-05-29 LAB — BASIC METABOLIC PANEL
CO2: 27 mEq/L (ref 19–32)
GFR calc Af Amer: >90 mL/min (ref >90)
GFR calc non Af Amer: 88 mL/min — ABNORMAL LOW (ref >90)
Glucose, Bld: 103 mg/dL — ABNORMAL HIGH (ref 70–99)
Sodium: 131 mEq/L — ABNORMAL LOW (ref 135–145)

## 2011-05-29 LAB — DIFFERENTIAL
Basophils Relative: 0 % (ref 0–1)
Eosinophils Relative: 2 % (ref 0–5)
Lymphocytes Relative: 28 % (ref 12–46)
Lymphs Abs: 1.9 10*3/uL (ref 0.7–4.0)
Monocytes Relative: 7 % (ref 3–12)
Neutro Abs: 4.3 10*3/uL (ref 1.7–7.7)

## 2011-05-29 LAB — PROTEIN ELECTROPH W RFLX QUANT IMMUNOGLOBULINS
Albumin ELP: 49.8 % — ABNORMAL LOW (ref 55.8–66.1)
Alpha-2-Globulin: 13.8 % — ABNORMAL HIGH (ref 7.1–11.8)
Beta 2: 6.8 % — ABNORMAL HIGH (ref 3.2–6.5)
Beta Globulin: 4.6 % — ABNORMAL LOW (ref 4.7–7.2)
M-Spike, %: NOT DETECTED g/dL

## 2011-05-29 LAB — FOLATE: Folate: 7.1 ng/mL

## 2011-05-29 LAB — IRON AND TIBC
Iron: 228 ug/dL — ABNORMAL HIGH (ref 42–135)
UIBC: 121 ug/dL — ABNORMAL LOW (ref 125–400)

## 2011-05-29 NOTE — H&P (Signed)
NAMESTACEY, MAURA NO.:  [id number]  MEDICAL RECORD NO.:  [id number]  LOCATION:  1236                         FACILITY:  [facility]  PHYSICIAN:  Charlaine Dalton. Sherene Sires, MD, FCCPDATE OF BIRTH:  1940/08/23  DATE OF ADMISSION:  05/23/2011 DATE OF DISCHARGE:                             HISTORY & PHYSICAL   CHIEF COMPLAINT:  Lethargy and altered mental status, hyponatremia.  HISTORY OF PRESENT ILLNESS:  This is a 70 year old male patient with multiple medical comorbidities. These include chronic pain, recent acute CVA with residual bilateral upper and lower extremity weakness, diabetes, hypertension, and gout.  Currently resides at an assisted living facility.  Presents to [facility] Emergency Room on May 23, 2011 after an evaluation at the assisted living facility had been initiated for increased lethargy and altered sensorium.  This evaluation had identified hyponatremia.  Because of this, he was sent to theemergency room for further evaluation.  Upon in the ER evaluation Mr. Rumery initial sodium was found to be 109.  He was hemodynamically stable, easily arousable with a strong cough, and oriented to person and situation; however, was confused to his current place of residence. Because of his profound hyponatremia they were concerned about potential decomposition and because of that the critical care team was asked to admit.  Unfortunately, no other data was obtainable due to limited records from the assisted living facility, and the patient's altered sensorium on presentation.  PAST MEDICAL HISTORY:  Includes the following: 1. Painful L5-S1 underlying spondylolisthesis with bilateral lower     extremity weakness and radiculopathy at L5 distribution. 2. Acute cerebrovascular accident, status post right posterior bodies     splenium of corpus callosum borders which had distribution. 3. Diabetes. 4. Hypertension. 5. Gout. 6. Rotator cuff tear. 7. Anterior  cervical discectomy and fusion of C5-C7. 8. Prior knee revision surgery. 9. Bilateral cataract surgery.  SOCIAL HISTORY:  Currently resides at an assisted living facility, per the records it appears as though this has been the case since May 16, 2011 following discharge from the hospital status post lumbar surgery.  The patient is retired, single.  He has no current history of smoking or alcohol, however, remote history is unclear.  FAMILY HISTORY:  Positive for diabetes and hypertension.  ALLERGIES:  In the chart are listed as no known drug allergies.  CURRENT MEDICATIONS:  At the skilled nursing facility include: 1. Triamterene/hydrochlorothiazide 37.5/25. 2. Tamsulosin 0.4 mg daily. 3. Simvastatin 20 mg daily. 4. Prednisone 10 mg dose pack, unknown when this was started. 5. Pantoprazole 40 mg daily. 6. Methocarbamol 500 mg q.6 h. 7. Lisinopril 40 mg daily. 8. Hydrocodone/APAP 5/325 one to two every 4 as needed. 9. Ferrous sulfate 325 mg daily. 10.Colchicine 0.6 mg daily. 11.Clonidine patch 0.3 mg every 24 hours. 12.Aspirin 325 daily. 13.Amlodipine 10 mg daily. 14.Allopurinol daily.  REVIEW OF SYSTEMS:  These are currently unobtainable due to the patient's confused state.  PHYSICAL EXAMINATION:  VITAL SIGNS:  On presentation, temperature 98.1, heart rate 83, blood pressure 136/76, respirations 18, saturations 100% on 2 L. GENERAL:  This is a well-nourished 70 year old male patient currently lying in bed in no acute distress. HEENT:  Normocephalic without jugular venous distention. PULMONARY:  Clear to auscultation.  Breath sounds are equal and not labored. CARDIAC:  Regular rate and rhythm. ABDOMEN:  Soft, nontender without organomegaly. EXTREMITIES:  Without significant edema. NEUROLOGIC:  Mr. Stiff is awake, oriented to self, however, he thinks he is currently at home; however, he was able to tell us that he was here for evaluation of low sodium.  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  CURRENT LABORATORY DATA:  MRSA PCR is negative.  BNP 88.1.  Initial sodium was 109 with a potassium of 4.8, chloride of 76, CO2 of 21, glucose 99, BUN 23, creatinine 1.45.  His urinalysis demonstrated a specific gravity of 1.014.  His random urine sodium was less than 10, urine osmolality of 188, TSH of 1.616, cortisol 10.4.  White blood cell count 5.7, hemoglobin 8.8, hematocrit 24.4, platelet count 472.  IMPRESSION AND PLAN: 1. Hyposmolar hyponatremia, suspect volume contraction in the setting     of thiazide diuretics, with free water access.  We have already     administered 80 mg of IV furosemide in the emergency room followed     by fluid challenge with resultant sodium rise from 109 to 112 since     his arrival in the emergency room.  At this point, we will admit     him to the intensive care, provide IV sodium chloride     resuscitation, discontinue his hydrochlorothiazide, and watch     serial sodiums. 2. History of hypertension.  For this, we will continue his clonidine;     however, we will do need to discontinue his hydrochlorothiazide. 3. Diabetes.  For this, we will continue sliding scale insulin. 4. Debilitation. 5. Cerebrovascular accident.  For this, he will need further     evaluation as his level of consciousness improves, may need in-     house physical therapy, occupational therapy depending on exam     after improvement of level of consciousness. 6. Encephalopathy.  Primarily metabolic in nature.  Unclear to what     extent this is acute and what extent this is chronic.  At this     point, we will continue medical management specifically in the     setting of profound hyponatremia.  He was admitted to the intensive     care given concern for possible seizure.  At this point, he     continues to improve.  DISPOSITION:  Mr. Huskins will be admitted to the step-down  unit for close observation in the next 24 hours.  I suspect he will be able to transit to the medical unit with Internal Medicine team to resume care in the next 24 hours.     Zenia Resides, NP   ______________________________ Charlaine Dalton. Sherene Sires, MD, FCCP    PB/MEDQ  D:  05/23/2011  T:  05/24/2011  Job:  [id number]  Electronically Signed by Zenia Resides NP on 05/24/2011 91:47:82 PM Electronically Signed by Sandrea Hughs MD FCCP on 05/29/2011 04:29:12 PM

## 2011-05-30 LAB — GLUCOSE, CAPILLARY
Glucose-Capillary: 124 mg/dL — ABNORMAL HIGH (ref 70–99)
Glucose-Capillary: 112 mg/dL — ABNORMAL HIGH (ref 70–99)

## 2011-05-30 LAB — CBC
HCT: 26.1 % — ABNORMAL LOW (ref 39.0–52.0)
Hemoglobin: 8.7 g/dL — ABNORMAL LOW (ref 13.0–17.0)
MCHC: 33.3 g/dL (ref 30.0–36.0)
RBC: 3.07 MIL/uL — ABNORMAL LOW (ref 4.22–5.81)
WBC: 6.7 10*3/uL (ref 4.0–10.5)

## 2011-05-30 LAB — BASIC METABOLIC PANEL
BUN: 13 mg/dL (ref 6–23)
CO2: 25 mEq/L (ref 19–32)
Chloride: 97 mEq/L (ref 96–112)
GFR calc non Af Amer: 88 mL/min — ABNORMAL LOW (ref >90)
Glucose, Bld: 93 mg/dL (ref 70–99)
Potassium: 4.2 mEq/L (ref 3.5–5.1)
Sodium: 129 mEq/L — ABNORMAL LOW (ref 135–145)

## 2011-05-30 LAB — UIFE/LIGHT CHAINS/TP QN, 24-HR UR
Albumin, U: DETECTED
Alpha 1, Urine: DETECTED — AB
Alpha 2, Urine: DETECTED — AB
Free Kappa Lt Chains,Ur: 5.99 mg/dL — ABNORMAL HIGH (ref 0.14–2.42)
Free Kappa/Lambda Ratio: 11.3 ratio — ABNORMAL HIGH (ref 2.04–10.37)
Free Lambda Excretion/Day: 9.01 mg/d
Total Protein, Urine: 10.2 mg/dL
Volume, Urine: 1700 mL

## 2011-05-30 LAB — SICKLE CELL SCREEN: Sickle Cell Screen: NEGATIVE

## 2011-05-30 LAB — FOLATE RBC: RBC Folate: 808 ng/mL — ABNORMAL HIGH (ref >=366)

## 2011-05-31 LAB — BASIC METABOLIC PANEL
BUN: 14 mg/dL (ref 6–23)
Chloride: 95 mEq/L — ABNORMAL LOW (ref 96–112)
Creatinine, Ser: 0.9 mg/dL (ref 0.50–1.35)
GFR calc non Af Amer: 85 mL/min — ABNORMAL LOW (ref >90)
Glucose, Bld: 113 mg/dL — ABNORMAL HIGH (ref 70–99)
Potassium: 4.2 mEq/L (ref 3.5–5.1)

## 2011-05-31 LAB — CROSSMATCH
Antibody Screen: NEGATIVE
Unit division: 0
Unit division: 0

## 2011-05-31 LAB — GLUCOSE, CAPILLARY

## 2011-05-31 LAB — GLUCOSE 6 PHOSPHATE DEHYDROGENASE: G-6-PD, Quant: 10.9 U/GM HGB (ref 7.0–20.5)

## 2011-05-31 LAB — CBC
HCT: 25 % — ABNORMAL LOW (ref 39.0–52.0)
Hemoglobin: 8.4 g/dL — ABNORMAL LOW (ref 13.0–17.0)
MCHC: 33.6 g/dL (ref 30.0–36.0)
MCV: 86.2 fL (ref 78.0–100.0)

## 2011-06-01 LAB — BASIC METABOLIC PANEL
CO2: 24 mEq/L (ref 19–32)
Chloride: 94 mEq/L — ABNORMAL LOW (ref 96–112)
Sodium: 126 mEq/L — ABNORMAL LOW (ref 135–145)

## 2011-06-01 LAB — GLUCOSE, CAPILLARY: Glucose-Capillary: 97 mg/dL (ref 70–99)

## 2011-06-01 LAB — SODIUM, URINE, RANDOM: Sodium, Ur: 115 mEq/L

## 2011-06-02 LAB — BASIC METABOLIC PANEL
BUN: 9 mg/dL (ref 6–23)
CO2: 24 mEq/L (ref 19–32)
Calcium: 9.2 mg/dL (ref 8.4–10.5)
Creatinine, Ser: 0.86 mg/dL (ref 0.50–1.35)
Glucose, Bld: 105 mg/dL — ABNORMAL HIGH (ref 70–99)

## 2011-06-02 LAB — CBC
HCT: 25 % — ABNORMAL LOW (ref 39.0–52.0)
Hemoglobin: 8.7 g/dL — ABNORMAL LOW (ref 13.0–17.0)
MCH: 29.5 pg (ref 26.0–34.0)
MCV: 84.7 fL (ref 78.0–100.0)
RBC: 2.95 MIL/uL — ABNORMAL LOW (ref 4.22–5.81)

## 2011-06-02 LAB — GLUCOSE, CAPILLARY: Glucose-Capillary: 114 mg/dL — ABNORMAL HIGH (ref 70–99)

## 2011-06-03 LAB — GLUCOSE, CAPILLARY: Glucose-Capillary: 97 mg/dL (ref 70–99)

## 2011-06-03 LAB — BASIC METABOLIC PANEL
Calcium: 9.3 mg/dL (ref 8.4–10.5)
GFR calc non Af Amer: 88 mL/min — ABNORMAL LOW (ref >90)
Glucose, Bld: 105 mg/dL — ABNORMAL HIGH (ref 70–99)
Sodium: 127 mEq/L — ABNORMAL LOW (ref 135–145)

## 2011-06-07 NOTE — Consult Note (Signed)
Luis Poole, Luis Poole                 ACCOUNT NO.:  [id number]  MEDICAL RECORD NO.:  [id number]  LOCATION:  5011                         FACILITY:  [facility]  PHYSICIAN:  Joycelyn Schmid, MD   DATE OF BIRTH:  12/01/1940  DATE OF CONSULTATION:  05/09/2011 DATE OF DISCHARGE:                                CONSULTATION   TIME:  12:15 a.m.  REASON FOR CONSULTATION:  Left upper and lower extremity weakness.  HISTORY OF PRESENT ILLNESS:  This is a 70 year old male with history of lumbar spine disease, lower extremity numbness and weakness, low back pain, neurogenic claudication, who was admitted on May 06, 2011 for L5-S1 decompression and interbody fusion.  Postoperatively, the patient complained of left upper and left lower extremity weakness.  He does report history of left rotator cuff tear.  He also has remote history of anterior cervical diskectomy and fusion C5-6 and C6-7.  The patient denies numbness of his arms or legs.  He denies any problems with his speech, face, or swallowing.  He denies neck pain.  PAST MEDICAL HISTORY: 1. Diabetes. 2. Hypertension. 3. Gout. 4. Possible rheumatoid arthritis.  PAST SURGICAL HISTORY: 1. Knee surgery. 2. Right foot surgery. 3. Left rotator cuff tear status post repair. 4. Anterior cervical diskectomy and fusion C5-6, C6-7.  FAMILY HISTORY: 1. Hypertension. 2. Diabetes.  SOCIAL HISTORY:  No tobacco, alcohol, or illicit drug use.  Retired. Single.  REVIEW OF SYSTEMS:  As per the HPI.  Denies chest pain, shortness of breath, numbness, or tingling.  No headaches.  PHYSICAL EXAMINATION:  VITAL SIGNS:  Temperature 98.8, T-max 100.1, pulse 91, respirations 20, blood pressure 152/78, 96% on room air. GENERAL:  He is awake and alert.  Language is fluent.  Comprehension intact. NEUROLOGIC:  Facial sensation and strength symmetric.  Uvula is midline. Shoulders symmetric.  Tongue is midline.  Motor examination, normal bulk and tone of  the right upper extremity.  Left upper extremity has decreased tone.  He is unable to lift his arm off the bed and cannot palpate any muscle contraction proximally. He has weakness of the triceps, biceps, wrist extension.  Finger grip strength is 3-4/5.  When I lift his left arm over his face and drop it he is able to exert some strength in his left triceps to keep the hand from falling and hitting his face.  Left lower extremity 2/5 proximally, 1/5 distally.  Right lower extremity 3/5 proximally and 4/5 distally.  Sensory examination intact to light touch, temperature, vibration, pinprick.  Reflexes: Trace in the upper extremities.  Absent at the knees and ankles. Downgoing toes. CARDIOVASCULAR:  Regular rate and rhythm.  No murmurs.  No carotid bruits.  CT scan of head which I reviewed shows motion artifact.  No acute findings.  ASSESSMENT AND PLAN:  A 70 year old male status post L5-S1 decompression and fusion now with left upper and left lower extremity weakness. Differential diagnosis includes right brain ischemic infarction that isnot apparent on CT, cervical spine pathology, polyradiculopathy, conversion reaction.  RECOMMENDATIONS:  Check MRI scan of the brain without contrast.     Joycelyn Schmid, MD     VP/MEDQ  D:  05/09/2011  T:  05/09/2011  Job:  [id number]  Electronically Signed by Joycelyn Schmid  on 06/07/2011 05:45:06 PM

## 2011-06-21 NOTE — Discharge Summary (Signed)
Luis Poole, Luis Poole NO.:  [id number]  MEDICAL RECORD NO.:  [id number]  LOCATION:  1336                         FACILITY:  [facility]  PHYSICIAN:  Rosanna Randy, MDDATE OF BIRTH:  1940/09/28  DATE OF ADMISSION:  05/23/2011 DATE OF DISCHARGE:  06/03/2011                              DISCHARGE SUMMARY   DISCHARGE DIAGNOSES:  Include; 1. 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 2. Encephalopathy secondary to problem #1. 3. Anemia.  Combination of anemia of chronic disease with iron     deficiency. 4. History of cerebrovascular accident. 5. Hypertension. 6. Benign prostatic hypertrophy. 7. Gastroesophageal reflux disease. 8. Hyperlipidemia. 9. Diabetes type 2, currently controlled just with diet. 10.Debility and deconditioning requiring physical therapy as an     outpatient. 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 12.History of rotator cuff tear. 13.Prior surgical revision of his knee. 14.Bilateral cataract surgery. 15.Anterior cervical diskectomy and fusion C5-C7. 16.Acute renal failure.  DISCHARGE MEDICATIONS: 1. Tylenol 650 mg every 6 hours as needed for pain and fever. 2. Ferrous sulfate 325 mg 1 tablet by mouth 3 times a day. 3. Lisinopril 20 mg 1 tablet by mouth daily. 4. MiraLax 17 g by mouth daily as needed for constipation. 5. Sodium chloride 1 g 1 tablet by mouth daily. 6. Hydrocodone 5/325, 1-2 tablets by mouth every 6 hours as needed for     pain not relieved by Tylenol. 7. Allopurinol 300 mg 1 tablet by mouth daily. 8. Amlodipine 10 mg 1 tablet by mouth daily. 9. Aspirin 325 mg 1 tablet by mouth daily. 10.Clonidine patch 0.3 mg/24 hours 1 patch transdermally every 7 days. 11.Colchicine 0.6 mg 1 tablet by mouth daily. 12.Robaxin 500 mg 1 tablet by mouth every 6 hours for muscle  spasms/pain. 13.Pantoprazole 40 mg 1 tablet by mouth daily. 14.Simvastatin 20 mg 1 tablet by mouth daily. 15.Flomax 0.4 mg 1 capsule by mouth daily.  DISPOSITION AND FOLLOWUP:  The patient had been discharged in a stable and improved condition.  He is going to [facility], but he will continue receiving supportive care physical rehab. The patient has instructions to arrange followup in 2 weeks with primary care physician, Dr. Bascom Levels.  It will be important at that time to review a CBC and a BMET in order to follow the patient's hemoglobin level and also on Mr. Luis Poole electrolytes and renal function.  The patient will also require further adjustment of his blood pressure medications as needed.  Also close followup on his diabetes, that at this point is just managed by diet.  The patient was instructed to follow a heart healthy diet, which includes low-fat diet and also a low carbohydrate diet.  The patient has also received instructions to keep the amount of fluids that he intakes per day to just 1000 mL in 24 hours.  PROCEDURE PERFORMED DURING THIS HOSPITALIZATION:  The patient had a chest x-ray 2 view that demonstrated no evidence of any active cardiopulmonary process.  Another chest x-ray done on May 24, 2011, demonstrated suboptimal ideation  with a minimal basilar volume loss, but no active process.  No other procedures were performed during this admission.  Dr. Bascom Levels who is the patient's primary care physician and also nephrologist was consulted during this hospitalization.  HISTORY OF PRESENT ILLNESS:  For full details, please refer to dictation done by Dr. Sandrea Hughs since the patient was initially admitted into the CCM service due to lethargy and mental status and severe hyponatremia.  Briefly, this is a 70 year old male with a history of chronic pain, acute cerebrovascular accident with residual bilateral upper and lower extremity weakness,  diabetes controlled with diet, hypertension, and gout, who reside in an assisted living facility.  The patient presents to the emergency room on May 23, 2011, due to increased lethargy and after sensorium.  Evaluation done at the nursing home identified the patient to be severely hyponatremic and at that moment, he was transferred to the emergency department for further evaluation and treatment.  The patient's sodium level on admission was found to be 109.  He was hemodynamically stable and easily aroused with a strong cough.  He was oriented just to person.  Due to the patient's confusion, profound hyponatremia, and potential decompensation, he was admitted to the critical care team.  Once the patient was stable, he was transferred to [facility] to continue providing further evaluation and treatment and to readdress the need for further assistant at discharge.  PERTINENT LABORATORY DATA THROUGHOUT THIS HOSPITALIZATION:  Includes a CBC with differential with a white blood cells of 5.7, hemoglobin 8.8, platelets 472.  Urinalysis was clear with a specific gravity of 1.014. No leukocytes.  No nitrites.  Urine osmolality was 184.  Cortisol level was 10.4.  BNP was 88.1.  MRSA PCR was negative.  The patient has a BMET demonstrating a sodium of 109, potassium 4.8, chloride 76, bicarb 21, glucose 99, BUN 23, creatinine was 1.45.  Liver function tests were normal.  Iodine was 18, absolute reticulocyte count was 52.3, and the reticulocyte percentage was 2.1.  Ferritin was 457, serum osmolality 259, random sodium in urine 115.  At discharge, the patient's CBC demonstrated a white blood cells of 6.4, hemoglobin of 8.8, platelets 194.  The patient's sodium was 128, potassium 3.9, chloride 95, bicarb 25, glucose 105, BUN 11, creatinine 0.84, calcium 9.3.  HOSPITAL COURSE BY PROBLEM: 1. 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  The  patient has been now placed on fluid     restriction diet and was also started on sodium tablets to be taken     by mouth daily.  He is going to follow up in 2 weeks by his primary     care doctors and nephrologist, Dr. Bascom Levels for further adjustment     of his medications.  At this point, the patient's mentation is     completely stable.  He is asymptomatic and no neurologic findings     are seen. 2. Anemia with a combination of iron deficiency and anemia of chronic     disease.  He will continue using ferrous sulfate and will follow     with primary care physician for further workup as an outpatient. 3. History of CVA, will continue using aspirin. 4. Hypertension, well controlled with the new regimen.  Now that we     had stopped his HCTZ, he will continue using amlodipine,     lisinopril, clonidine patch and will follow with primary care  physician for further adjustment on his antihypertensive drugs.     The patient has been advised to follow a heart healthy diet. 5. BPH.  He will continue using Flomax. 6. Gastroesophageal reflux disease, will continue using PPI. 7. Hyperlipidemia, will continue statins. 8. Diabetes.  At this point, controlled just with diet.  He will     continue following a low carbohydrate diet, and is going to follow     with primary care physician for further followup on his diabetes     and decision on when to start if needed hypoglycemic agents. 9. Debility and deconditioning.  He was going to be discharged to a     skilled nurse facility where he will participate on physical     therapy and occupational therapy program. 10.Acute renal failure which was associated in part for the     dehydration process and decreased perfusion driven by the SIADH.     At this point, renal function is completely back to normal. 11.Gout, will continue using colchicine and allopurinol.  PHYSICAL EXAMINATION:  VITAL SIGNS:  At discharge; temperature 98.6, heart rate 65,  respiratory rate 18, blood pressure 140/78, oxygen saturation 100% on room air. GENERAL:  No acute distress. RESPIRATORY SYSTEM:  Clear to auscultation bilaterally. HEART:  S1 and S2. ABDOMEN:  Soft, nontender with positive bowel sounds. EXTREMITIES:  No edema. NEUROLOGIC EXAM:  No new deficit.  The patient with left hemiparesis.     Rosanna Randy, MD     CEM/MEDQ  D:  06/03/2011  T:  06/03/2011  Job:  [id number]  cc:   Dr. Bascom Levels  Electronically Signed by Vassie Loll MD on 06/21/2011 10:30:56 PM

## 2011-09-25 ENCOUNTER — Encounter: Payer: Medicare HMO | Attending: Physical Medicine & Rehabilitation | Admitting: Physical Medicine & Rehabilitation

## 2011-09-25 DIAGNOSIS — IMO0002 Reserved for concepts with insufficient information to code with codable children: Secondary | ICD-10-CM

## 2011-09-25 DIAGNOSIS — M109 Gout, unspecified: Secondary | ICD-10-CM | POA: Insufficient documentation

## 2011-09-25 DIAGNOSIS — Z79899 Other long term (current) drug therapy: Secondary | ICD-10-CM | POA: Insufficient documentation

## 2011-09-25 DIAGNOSIS — M48061 Spinal stenosis, lumbar region without neurogenic claudication: Secondary | ICD-10-CM | POA: Insufficient documentation

## 2011-09-25 DIAGNOSIS — Z981 Arthrodesis status: Secondary | ICD-10-CM | POA: Insufficient documentation

## 2011-09-25 DIAGNOSIS — M47817 Spondylosis without myelopathy or radiculopathy, lumbosacral region: Secondary | ICD-10-CM

## 2011-09-25 DIAGNOSIS — I6330 Cerebral infarction due to thrombosis of unspecified cerebral artery: Secondary | ICD-10-CM

## 2011-09-25 DIAGNOSIS — I69959 Hemiplegia and hemiparesis following unspecified cerebrovascular disease affecting unspecified side: Secondary | ICD-10-CM | POA: Insufficient documentation

## 2011-09-25 NOTE — Letter (Signed)
September 25, 2011  Kerrin Champagne, M.D.  RE:  MATEI MAGNONE Policy ID # Policy Holder:  Dear Fayrene Fearing;  I had the opportunity to meet Rochele Raring today in my office regarding his back and leg issues.  I spent an extensive amount of time reviewing his case and coming up with custom treatment recommendations.  Thank you kindly for the referral and full note is as follows.  ASSESSMENT:  This is a pleasant 71 year old, African American male who underwent a L5-S1 transforaminal lumbar interbody fusion in September 2012.  His postoperative course was complicated by a right brain stroke with left-sided weakness.  He was put in a TLSO brace initially and was sent to a nursing home for recovery.  He has been home.  He recently received an AFO about a week ago. Home health has been working on exercises with him.  He notes that since working on exercises with home health therapies, he has had some improvement in his straight.  He states he initially had been doing exercises daily, but now is only doing them 2 or 3 days a week.  They usually entail some lower extremity stretching as well as clots and transfer exercises.  He is walking with a walker in and around the house.  His son is with him usually for balance.  The patient reports 7/10 pain in his low back.  He states that the back is much better since this operation, however.  He does have some tingling and numbness of the left foot to a certain extent that is sometimes painful.  He wears compression stockings for edema control and to protect his skin.  REVIEW OF SYSTEMS:  Notable for the above.  Does report fair sleep.  Appetite has been good.  Bowel and bladder function has been stable.  Full 12-point review is in the written health and history section.  PAST MEDICAL HISTORY:  Positive for high blood pressure, diet- controlled diabetes, COPD or asthma, gout and lumbar spine issues and stroke as mentioned above.  MEDICATIONS: 1.  Norvasc 10 mg daily. 2. Lisinopril 40 mg daily. 3. Triamterene/hydrochlorothiazide daily. 4. Indomethacin 25 mg b.i.d. 5. Colcrys 0.6 mg daily p.r.n. 6. Allopurinol 300 mg daily. 7. Hydrocodone 7.5/750 one q.8 h. p.r.n.  ALLERGIES:  None.  SOCIAL HISTORY:  The patient is single, lives with his son.  He does not smoke and has not smoked since 66.  He denies drinking.  FAMILY HISTORY:  Notable for diabetes and high blood pressure.  PHYSICAL EXAMINATION:  VITAL SIGNS:  Blood pressure is 135/65, pulse 68, respiratory rate is 16 and he is satting 100% on room air. GENERAL:  The patient is generally pleasant, alert and oriented x3.  Pupils equally round and reactive to light. HEART:  Regular rate. CHEST:  Clear. ABDOMEN:  Soft and nontender. EXTREMITIES:  Upper extremity strength is notable for 5/5 strength right arm.  Left upper extremity is grossly 4/5 with diminished fine motor coordination.  He had some pain inhibition to the elbow due to Prefest rotator cuff pain.  Left lower extremity grossly 3+ to 4/5, hip flexion 2+ to 3/5, knee extension 3/5, knee flexion 0/5, ankle dorsiflexion 1 to 1+/5, ankle plantar flexion and 0/5 ankle eversion or inversion.  Right lower extremity grossly 4-5/5. He had diminished sensation over the distal leg into the foot, though he does sense pain however and light touch still.  The patient ambulated for me today and tended to walk with a circumducted pattern with  the left leg externally rotated to assisted clearance.  He is able to correct a bit with cuing.  He is able to transfer independently.  From a range and motion standpoint, he was limited to about -10 to full ankle dorsiflexion passively and about -20 from 90 degrees of flexion today.  Low back was generally tender to palpation.  He did not push range of motion today in standing, given some of his balance problems.  His operation site scar which is clean and intact.  The spasticity was 0/4  in the left upper and left lower. Reflexes perhaps 1+ to 2+ on the left and 1+ on the right today. NEUROLOGIC:  Cognitively, the patient was generally appropriate as noted above.  He is able to answer biographical questions, follow commands, etc.  ASSESSMENT: 1. History of lumbar stenosis and radiculopathy, status post L5-     S1 fusion. 2. 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  He has a mild     hemisensory deficits as well 3. History of gout.  PLAN: 1. At this point, I do not see much in the way of spasticity.  I     think a lot of the problem here is that he has learned some     bad habits, given the fact that he only recently received his     AFO.  He will need to be retrained in appropriate gait     techniques.  He does have adequate hip flexion and knee     extension to allow him to walk in a more symmetrical and     balanced fashion.  He does have some dorsiflexion assist that     spilt into the left double upright AFO as well.  He is     receiving therapy through [facility] at home.  However, he would be     better off going for outpatient neuro rehab where they     specialize in gait  assessment and training for this type of     problem. 2. At this point, as I said before the spasticity is really not     an issue, and I would not treat this in any way.  He does     need some general range of motion exercises and stretching to     his ankle as well as his right knee, which are contracted.     Anything for spasticity is likely to cause him further     balance deficits as well. 3. I discussed with the patient at length today regarding the     fact that he needs to followup any     training with exercises at home.  He can expect to have 2 or     3 sessions of therapy per week and make progress. 4. I will see the patient back in about 3 months.  He is advised     to call with any problems or questions, hope he does not  hear     back from the therapy center.     Ranelle Oyster, M.D. Electronically Signed    KGM:WNUU D:  09/25/2011 13:26:00  T:  09/25/2011 19:55:34  Job #:  [id number]

## 2011-11-06 ENCOUNTER — Other Ambulatory Visit: Payer: Self-pay | Admitting: Specialist

## 2011-11-06 DIAGNOSIS — M545 Low back pain, unspecified: Secondary | ICD-10-CM

## 2011-11-08 ENCOUNTER — Ambulatory Visit
Admission: RE | Admit: 2011-11-08 | Discharge: 2011-11-08 | Disposition: A | Discharge status: Home / Self Care | Payer: Medicare HMO | Source: Ambulatory Visit | Attending: Specialist | Admitting: Specialist

## 2011-11-08 DIAGNOSIS — M545 Low back pain, unspecified: Secondary | ICD-10-CM

## 2011-11-20 ENCOUNTER — Other Ambulatory Visit: Payer: Self-pay | Admitting: Nephrology

## 2011-12-24 ENCOUNTER — Encounter: Payer: Medicare HMO | Attending: Physical Medicine & Rehabilitation | Admitting: Physical Medicine & Rehabilitation

## 2011-12-24 DIAGNOSIS — M961 Postlaminectomy syndrome, not elsewhere classified: Secondary | ICD-10-CM | POA: Insufficient documentation

## 2011-12-24 DIAGNOSIS — E1149 Type 2 diabetes mellitus with other diabetic neurological complication: Secondary | ICD-10-CM | POA: Insufficient documentation

## 2011-12-24 DIAGNOSIS — G8190 Hemiplegia, unspecified affecting unspecified side: Secondary | ICD-10-CM | POA: Insufficient documentation

## 2011-12-24 DIAGNOSIS — IMO0002 Reserved for concepts with insufficient information to code with codable children: Secondary | ICD-10-CM | POA: Insufficient documentation

## 2011-12-24 DIAGNOSIS — G609 Hereditary and idiopathic neuropathy, unspecified: Secondary | ICD-10-CM | POA: Insufficient documentation

## 2011-12-24 DIAGNOSIS — E1142 Type 2 diabetes mellitus with diabetic polyneuropathy: Secondary | ICD-10-CM | POA: Insufficient documentation

## 2012-01-15 ENCOUNTER — Encounter: Payer: Self-pay | Admitting: Physical Medicine & Rehabilitation

## 2012-01-15 ENCOUNTER — Encounter (HOSPITAL_BASED_OUTPATIENT_CLINIC_OR_DEPARTMENT_OTHER): Payer: Medicare HMO | Admitting: Physical Medicine & Rehabilitation

## 2012-01-15 VITALS — BP 159/84 | HR 68 | Ht 68.0 in | Wt 227.0 lb

## 2012-01-15 DIAGNOSIS — E1142 Type 2 diabetes mellitus with diabetic polyneuropathy: Secondary | ICD-10-CM

## 2012-01-15 DIAGNOSIS — IMO0002 Reserved for concepts with insufficient information to code with codable children: Secondary | ICD-10-CM

## 2012-01-15 DIAGNOSIS — M961 Postlaminectomy syndrome, not elsewhere classified: Secondary | ICD-10-CM

## 2012-01-15 DIAGNOSIS — G609 Hereditary and idiopathic neuropathy, unspecified: Secondary | ICD-10-CM

## 2012-01-15 DIAGNOSIS — E1149 Type 2 diabetes mellitus with other diabetic neurological complication: Secondary | ICD-10-CM

## 2012-01-15 DIAGNOSIS — G8190 Hemiplegia, unspecified affecting unspecified side: Secondary | ICD-10-CM

## 2012-01-15 DIAGNOSIS — G8194 Hemiplegia, unspecified affecting left nondominant side: Secondary | ICD-10-CM

## 2012-01-15 MED ORDER — GABAPENTIN 100 MG PO CAPS: 100.0000 mg | ORAL_CAPSULE | Freq: Three times a day (TID) | ORAL | Status: DC

## 2012-01-15 NOTE — Progress Notes (Signed)
Subjective:    Patient ID: Luis Poole, male    DOB: 1941/03/15, 71 y.o.   MRN: [id number]  HPI  Mr. Aspinall is back regarding his left leg weaknes.. He never participated in the [facility] program due the fact he didn't quallify financially. He saw therapies at home only briefly. He has received his new double upright AFO.  He tries to walk a lot at home.  He tells me his family critizes him for trying to walk too "fast".  His brace fits ok. He does complain of pins and needle-like sensations in his toes.   Pain Inventory Average Pain 9 Pain Right Now 9 My pain is intermittent and sharp  In the last 24 hours, has pain interfered with the following? General activity 7 Relation with others 7 Enjoyment of life 7 What TIME of day is your pain at its worst? morning and evening Sleep (in general) Good  Pain is worse with: sitting, inactivity and standing Pain improves with: rest, therapy/exercise and medication Relief from Meds: 5  Mobility use a cane how many minutes can you walk? 30-40 min ability to climb steps?  yes do you drive?  no transfers alone Do you have any goals in this area?  yes  Function disabled: date disabled 2001 I need assistance with the following:  bathing, household duties and shopping Do you have any goals in this area?  yes  Neuro/Psych weakness trouble walking  Prior Studies Any changes since last visit?  no  Physicians involved in your care Any changes since last visit?  no   Family History  Problem Relation Age of Onset  . Stroke Mother    History   Social History  . Marital Status: Single    Spouse Name: N/A    Number of Children: N/A  . Years of Education: N/A   Social History Main Topics  . Smoking status: Former Games developer  . Smokeless tobacco: Former Neurosurgeon  . Alcohol Use: No  . Drug Use: None  . Sexually Active: None   Other Topics Concern  . None   Social History Narrative  . None   Past Surgical History  Procedure Date  .  Cataract extraction   . Knee surgery   . Spine surgery   . Eye surgery   . Neck surgery   . Tonsilectomy, adenoidectomy, bilateral myringotomy and tubes    Past Medical History  Diagnosis Date  . S/P lumbar fusion   . Hypertension   . Diabetes mellitus   . Cataract   . Stroke    BP 159/84  Pulse 68  Ht 5\' 8"  (1.727 m)  Wt 227 lb (102.967 kg)  BMI 34.52 kg/m2  SpO2 100%     Review of Systems  Respiratory: Positive for cough.   Neurological: Positive for weakness.  All other systems reviewed and are negative.       Objective:   Physical Exam  Constitutional: He is oriented to person, place, and time. He appears well-developed and well-nourished.  HENT:  Head: Normocephalic and atraumatic.  Right Ear: External ear normal.  Left Ear: External ear normal.  Nose: Nose normal.  Mouth/Throat: Oropharynx is clear and moist.  Eyes: Conjunctivae are normal. Pupils are equal, round, and reactive to light.  Cardiovascular: Normal rate and regular rhythm.   Pulmonary/Chest: Effort normal and breath sounds normal. No respiratory distress. He has no wheezes.  Neurological: He is alert and oriented to person, place, and time.  Trace ank and eversion in the left leg. He has 1-2/5 plantar flexion. Knee extension is 3+ to 4/5. Knee flexion is 4/5. Hip abduction and abduction are 4/5. He tended to walk with recurvatum at the left knee. His left foot tends to drag on the floor  as well with swing phase of gait.  No resting tone is seen. Reflexes may be a bit hyperreflexive on the left but not substantially so.  Stocking glove sensory loss in both feet specifically over the midfoot and into the toes.          Assessment & Plan:  1. 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  2. Hx of right brain stroke with left hemiparesis 3. Diabetes with peripheral neuropathy 4. Hx of gout   Plan: 1. Made a referral to [facility] to improve gait quality.  He needs to take his time as well.  I see his quad strength as a major issue at present. 2. Diabetes control 3. Pt can go see [employer] for a kick plate for left shoe. An Rx  Was provided 4. Discussed with him the fact that he needs focus on the quality of his gait as opposed to the speed. 5. I'll see him back in about 4 months.

## 2012-01-15 NOTE — Patient Instructions (Signed)
Contact [employer] regarding a kick plate for your left shoe.

## 2012-01-31 ENCOUNTER — Ambulatory Visit: Payer: Medicare HMO | Attending: Physical Medicine & Rehabilitation | Admitting: Physical Therapy

## 2012-01-31 DIAGNOSIS — R269 Unspecified abnormalities of gait and mobility: Secondary | ICD-10-CM | POA: Insufficient documentation

## 2012-01-31 DIAGNOSIS — IMO0001 Reserved for inherently not codable concepts without codable children: Secondary | ICD-10-CM | POA: Insufficient documentation

## 2012-01-31 DIAGNOSIS — M6281 Muscle weakness (generalized): Secondary | ICD-10-CM | POA: Insufficient documentation

## 2012-02-01 ENCOUNTER — Other Ambulatory Visit: Payer: Self-pay | Admitting: Nephrology

## 2012-02-03 ENCOUNTER — Other Ambulatory Visit: Payer: Self-pay | Admitting: Nephrology

## 2012-02-10 ENCOUNTER — Ambulatory Visit: Payer: Medicare HMO | Admitting: Physical Therapy

## 2012-02-12 ENCOUNTER — Ambulatory Visit: Payer: Medicare HMO | Admitting: Physical Therapy

## 2012-02-18 ENCOUNTER — Ambulatory Visit: Payer: Medicare HMO | Attending: Physical Medicine & Rehabilitation | Admitting: Physical Therapy

## 2012-02-18 DIAGNOSIS — R269 Unspecified abnormalities of gait and mobility: Secondary | ICD-10-CM | POA: Insufficient documentation

## 2012-02-18 DIAGNOSIS — M6281 Muscle weakness (generalized): Secondary | ICD-10-CM | POA: Insufficient documentation

## 2012-02-18 DIAGNOSIS — IMO0001 Reserved for inherently not codable concepts without codable children: Secondary | ICD-10-CM | POA: Insufficient documentation

## 2012-02-21 ENCOUNTER — Ambulatory Visit: Payer: Medicare HMO | Admitting: Physical Therapy

## 2012-02-25 ENCOUNTER — Ambulatory Visit: Payer: Medicare HMO | Admitting: Physical Therapy

## 2012-02-27 ENCOUNTER — Ambulatory Visit: Payer: Medicare HMO | Admitting: Physical Therapy

## 2012-03-02 ENCOUNTER — Ambulatory Visit: Payer: Medicare HMO | Admitting: Physical Therapy

## 2012-03-03 ENCOUNTER — Ambulatory Visit: Payer: Medicare HMO | Admitting: Physical Therapy

## 2012-03-10 ENCOUNTER — Ambulatory Visit: Payer: Medicare HMO | Admitting: Physical Therapy

## 2012-03-12 ENCOUNTER — Ambulatory Visit: Payer: Medicare HMO | Admitting: Physical Therapy

## 2012-03-18 ENCOUNTER — Ambulatory Visit: Payer: Medicare HMO | Admitting: Physical Therapy

## 2012-03-20 ENCOUNTER — Ambulatory Visit: Payer: Medicare HMO | Attending: Physical Medicine & Rehabilitation | Admitting: Physical Therapy

## 2012-03-20 DIAGNOSIS — M6281 Muscle weakness (generalized): Secondary | ICD-10-CM | POA: Insufficient documentation

## 2012-03-20 DIAGNOSIS — IMO0001 Reserved for inherently not codable concepts without codable children: Secondary | ICD-10-CM | POA: Insufficient documentation

## 2012-03-20 DIAGNOSIS — R269 Unspecified abnormalities of gait and mobility: Secondary | ICD-10-CM | POA: Insufficient documentation

## 2012-03-30 ENCOUNTER — Ambulatory Visit: Payer: Medicare HMO | Admitting: Physical Therapy

## 2012-04-01 ENCOUNTER — Ambulatory Visit: Payer: Medicare HMO | Admitting: Physical Therapy

## 2012-05-15 ENCOUNTER — Encounter: Payer: Self-pay | Admitting: Physical Medicine & Rehabilitation

## 2012-05-15 ENCOUNTER — Encounter: Payer: Medicare Other | Attending: Physical Medicine & Rehabilitation | Admitting: Physical Medicine & Rehabilitation

## 2012-05-15 VITALS — BP 165/83 | HR 73 | Resp 16 | Ht 68.0 in | Wt 233.0 lb

## 2012-05-15 DIAGNOSIS — I69959 Hemiplegia and hemiparesis following unspecified cerebrovascular disease affecting unspecified side: Secondary | ICD-10-CM | POA: Insufficient documentation

## 2012-05-15 DIAGNOSIS — I10 Essential (primary) hypertension: Secondary | ICD-10-CM | POA: Insufficient documentation

## 2012-05-15 DIAGNOSIS — E1149 Type 2 diabetes mellitus with other diabetic neurological complication: Secondary | ICD-10-CM | POA: Insufficient documentation

## 2012-05-15 DIAGNOSIS — M549 Dorsalgia, unspecified: Secondary | ICD-10-CM | POA: Insufficient documentation

## 2012-05-15 DIAGNOSIS — G8190 Hemiplegia, unspecified affecting unspecified side: Secondary | ICD-10-CM

## 2012-05-15 DIAGNOSIS — IMO0002 Reserved for concepts with insufficient information to code with codable children: Secondary | ICD-10-CM

## 2012-05-15 DIAGNOSIS — G609 Hereditary and idiopathic neuropathy, unspecified: Secondary | ICD-10-CM

## 2012-05-15 DIAGNOSIS — M109 Gout, unspecified: Secondary | ICD-10-CM | POA: Insufficient documentation

## 2012-05-15 DIAGNOSIS — M961 Postlaminectomy syndrome, not elsewhere classified: Secondary | ICD-10-CM

## 2012-05-15 DIAGNOSIS — Z981 Arthrodesis status: Secondary | ICD-10-CM | POA: Insufficient documentation

## 2012-05-15 DIAGNOSIS — E1142 Type 2 diabetes mellitus with diabetic polyneuropathy: Secondary | ICD-10-CM

## 2012-05-15 DIAGNOSIS — R269 Unspecified abnormalities of gait and mobility: Secondary | ICD-10-CM | POA: Insufficient documentation

## 2012-05-15 DIAGNOSIS — G8194 Hemiplegia, unspecified affecting left nondominant side: Secondary | ICD-10-CM

## 2012-05-15 MED ORDER — METHOCARBAMOL 500 MG PO TABS: 250.0000 mg | ORAL_TABLET | Freq: Four times a day (QID) | ORAL | Status: DC | PRN

## 2012-05-15 NOTE — Progress Notes (Signed)
Subjective:    Patient ID: Luis Poole, male    DOB: 05-02-1941, 71 y.o.   MRN: [id number]  HPI  Luis Poole is back regarding his gait disorder and chronic pain. His back has been bothering him more over the last few months. He has more when he walks or stands for more than a few minutes at a time. He also has pain if he sits for a long period of time  He uses hydrocodone which doesn't help too much. He has a pain"spray"" which does provide some relief.   Pain Inventory Average Pain 10 Pain Right Now 9 My pain is sharp, burning, tingling and aching  In the last 24 hours, has pain interfered with the following? General activity 10 Relation with others 10 Enjoyment of life 10 What TIME of day is your pain at its worst? morning Sleep (in general) Fair  Pain is worse with: bending and sitting Pain improves with: rest Relief from Meds: 10  Mobility use a cane ability to climb steps?  yes do you drive?  yes  Function retired  Neuro/Psych weakness numbness trouble walking  Prior Studies Any changes since last visit?  no  Physicians involved in your care Any changes since last visit?  no   Family History  Problem Relation Age of Onset  . Stroke Mother    History   Social History  . Marital Status: Single    Spouse Name: N/A    Number of Children: N/A  . Years of Education: N/A   Social History Main Topics  . Smoking status: Former Games developer  . Smokeless tobacco: Former Neurosurgeon  . Alcohol Use: No  . Drug Use: None  . Sexually Active: None   Other Topics Concern  . None   Social History Narrative  . None   Past Surgical History  Procedure Date  . Cataract extraction   . Knee surgery   . Spine surgery   . Eye surgery   . Neck surgery   . Tonsilectomy, adenoidectomy, bilateral myringotomy and tubes    Past Medical History  Diagnosis Date  . S/P lumbar fusion   . Hypertension   . Diabetes mellitus   . Cataract   . Stroke    BP 165/83  Pulse 73   Resp 16  Ht 5\' 8"  (1.727 m)  Wt 233 lb (105.688 kg)  BMI 35.43 kg/m2  SpO2 99%     Review of Systems  Musculoskeletal: Positive for myalgias, back pain, arthralgias and gait problem.  Neurological: Positive for weakness and numbness.  All other systems reviewed and are negative.       Objective:   Physical Exam Constitutional: He is oriented to person, place, and time. He appears well-developed and well-nourished.  HENT:  Head: Normocephalic and atraumatic.  Right Ear: External ear normal.  Left Ear: External ear normal.  Nose: Nose normal.  Mouth/Throat: Oropharynx is clear and moist.  Eyes: Conjunctivae are normal. Pupils are equal, round, and reactive to light.  Cardiovascular: Normal rate and regular rhythm.  Pulmonary/Chest: Effort normal and breath sounds normal. No respiratory distress. He has no wheezes.  Neurological: He is alert and oriented to person, place, and time.  Trace ank and eversion in the left leg. He has 1-2/5 plantar flexion. Knee extension is 3+ to 4/5. Knee flexion is 4/5. Hip abduction and abduction are 4/5. He tended to walk with recurvatum at the left knee. He had better foot clearance as a whole today.No resting tone  is seen. Reflexes may be a bit hyperreflexive on the left but not substantially so.  Stocking glove sensory loss in both feet specifically over the midfoot and into the toes.   Back is notable for tenderness to palpation at the L5-S1 levels predominantly. Pain seems to be worse with extension than flexion. Spasm was notable along the paraspinals at this level.   Assessment & Plan:   1. 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 Pt with persistent back pain. 2. Hx of right brain stroke with left hemiparesis  3. Diabetes with peripheral neuropathy  4. Hx of gout    Plan:  1. Made a referral to [facility] to improve gait quality. He needs to take his time as well. I see his quad strength as a major  issue at present.  2. Diabetes control  3. Will try low dose robaxin for muscle spasm  4. Discussed with him the fact that he needs focus on the quality of his gait as opposed to the speed. I would rather that he use his cane for all of his walking---not just outside the home 5. I'll see him back in about 4 months.

## 2012-05-15 NOTE — Patient Instructions (Signed)
TRY HALF OF A MUSCLE RELAXANT (ROBAXIN) TABLET EVERY 6 HOURS AS NEEDED FOR LOW BACK PAIN AND SPASMS.  THERAPY WILL CALL YOU FOR AN APPT

## 2012-05-19 ENCOUNTER — Ambulatory Visit: Payer: Medicare Other | Attending: Physical Medicine & Rehabilitation | Admitting: Physical Therapy

## 2012-05-19 DIAGNOSIS — R269 Unspecified abnormalities of gait and mobility: Secondary | ICD-10-CM | POA: Insufficient documentation

## 2012-05-19 DIAGNOSIS — IMO0001 Reserved for inherently not codable concepts without codable children: Secondary | ICD-10-CM | POA: Insufficient documentation

## 2012-05-19 DIAGNOSIS — M6281 Muscle weakness (generalized): Secondary | ICD-10-CM | POA: Insufficient documentation

## 2012-05-25 ENCOUNTER — Ambulatory Visit: Payer: Medicare Other | Admitting: Physical Therapy

## 2012-05-27 ENCOUNTER — Ambulatory Visit: Payer: Medicare Other | Admitting: Physical Therapy

## 2012-06-01 ENCOUNTER — Ambulatory Visit: Payer: Medicare Other | Admitting: Physical Therapy

## 2012-06-03 ENCOUNTER — Ambulatory Visit: Payer: Medicare Other | Admitting: Physical Therapy

## 2012-06-09 ENCOUNTER — Ambulatory Visit: Payer: Medicare Other | Admitting: Physical Therapy

## 2012-06-11 ENCOUNTER — Ambulatory Visit: Payer: Medicare Other | Admitting: Physical Therapy

## 2012-06-16 ENCOUNTER — Ambulatory Visit: Payer: Medicare Other | Admitting: Physical Therapy

## 2012-06-18 ENCOUNTER — Encounter: Payer: Medicare Other | Admitting: Physical Therapy

## 2012-06-29 ENCOUNTER — Encounter: Payer: Medicare Other | Admitting: Physical Therapy

## 2012-06-29 ENCOUNTER — Ambulatory Visit: Payer: Medicare Other | Attending: Physical Medicine & Rehabilitation | Admitting: Physical Therapy

## 2012-06-29 DIAGNOSIS — M6281 Muscle weakness (generalized): Secondary | ICD-10-CM | POA: Insufficient documentation

## 2012-06-29 DIAGNOSIS — R269 Unspecified abnormalities of gait and mobility: Secondary | ICD-10-CM | POA: Insufficient documentation

## 2012-06-29 DIAGNOSIS — IMO0001 Reserved for inherently not codable concepts without codable children: Secondary | ICD-10-CM | POA: Insufficient documentation

## 2012-08-28 ENCOUNTER — Encounter (HOSPITAL_COMMUNITY): Payer: Self-pay | Admitting: Emergency Medicine

## 2012-08-28 ENCOUNTER — Emergency Department (HOSPITAL_COMMUNITY): Payer: Medicare Other

## 2012-08-28 ENCOUNTER — Inpatient Hospital Stay (HOSPITAL_COMMUNITY)
Admission: EM | Admit: 2012-08-28 | Discharge: 2012-08-30 | DRG: 065 | Disposition: A | Discharge status: Home Health | Payer: Medicare Other | Attending: Family Medicine | Admitting: Family Medicine

## 2012-08-28 DIAGNOSIS — E1142 Type 2 diabetes mellitus with diabetic polyneuropathy: Secondary | ICD-10-CM

## 2012-08-28 DIAGNOSIS — Z981 Arthrodesis status: Secondary | ICD-10-CM

## 2012-08-28 DIAGNOSIS — G8190 Hemiplegia, unspecified affecting unspecified side: Secondary | ICD-10-CM

## 2012-08-28 DIAGNOSIS — I639 Cerebral infarction, unspecified: Secondary | ICD-10-CM

## 2012-08-28 DIAGNOSIS — Z87891 Personal history of nicotine dependence: Secondary | ICD-10-CM

## 2012-08-28 DIAGNOSIS — IMO0002 Reserved for concepts with insufficient information to code with codable children: Secondary | ICD-10-CM

## 2012-08-28 DIAGNOSIS — Z79899 Other long term (current) drug therapy: Secondary | ICD-10-CM

## 2012-08-28 DIAGNOSIS — I10 Essential (primary) hypertension: Secondary | ICD-10-CM

## 2012-08-28 DIAGNOSIS — Z8673 Personal history of transient ischemic attack (TIA), and cerebral infarction without residual deficits: Secondary | ICD-10-CM | POA: Diagnosis present

## 2012-08-28 DIAGNOSIS — E1169 Type 2 diabetes mellitus with other specified complication: Secondary | ICD-10-CM

## 2012-08-28 DIAGNOSIS — G8194 Hemiplegia, unspecified affecting left nondominant side: Secondary | ICD-10-CM

## 2012-08-28 DIAGNOSIS — Z7982 Long term (current) use of aspirin: Secondary | ICD-10-CM

## 2012-08-28 DIAGNOSIS — I6350 Cerebral infarction due to unspecified occlusion or stenosis of unspecified cerebral artery: Principal | ICD-10-CM

## 2012-08-28 DIAGNOSIS — I69959 Hemiplegia and hemiparesis following unspecified cerebrovascular disease affecting unspecified side: Secondary | ICD-10-CM

## 2012-08-28 DIAGNOSIS — M5417 Radiculopathy, lumbosacral region: Secondary | ICD-10-CM | POA: Diagnosis present

## 2012-08-28 DIAGNOSIS — E119 Type 2 diabetes mellitus without complications: Secondary | ICD-10-CM

## 2012-08-28 DIAGNOSIS — I69992 Facial weakness following unspecified cerebrovascular disease: Secondary | ICD-10-CM

## 2012-08-28 LAB — URINALYSIS, ROUTINE W REFLEX MICROSCOPIC
Glucose, UA: NEGATIVE mg/dL
Leukocytes, UA: NEGATIVE
Specific Gravity, Urine: 1.017 (ref 1.005–1.030)
pH: 5.5 (ref 5.0–8.0)

## 2012-08-28 LAB — CBC
HCT: 34.7 % — ABNORMAL LOW (ref 39.0–52.0)
RDW: 13.3 % (ref 11.5–15.5)
WBC: 6.2 10*3/uL (ref 4.0–10.5)

## 2012-08-28 LAB — ETHANOL: Alcohol, Ethyl (B): <11 mg/dL (ref 0–11)

## 2012-08-28 LAB — HEPATIC FUNCTION PANEL
ALT: 12 U/L (ref 0–53)
Albumin: 3.8 g/dL (ref 3.5–5.2)
Alkaline Phosphatase: 70 U/L (ref 39–117)
Total Bilirubin: 0.2 mg/dL — ABNORMAL LOW (ref 0.3–1.2)
Total Protein: 7.9 g/dL (ref 6.0–8.3)

## 2012-08-28 LAB — GLUCOSE, CAPILLARY: Glucose-Capillary: 104 mg/dL — ABNORMAL HIGH (ref 70–99)

## 2012-08-28 LAB — BASIC METABOLIC PANEL
BUN: 24 mg/dL — ABNORMAL HIGH (ref 6–23)
Chloride: 102 mEq/L (ref 96–112)
GFR calc Af Amer: 61 mL/min — ABNORMAL LOW (ref >90)
Potassium: 3.7 mEq/L (ref 3.5–5.1)

## 2012-08-28 LAB — RAPID URINE DRUG SCREEN, HOSP PERFORMED: Benzodiazepines: NOT DETECTED

## 2012-08-28 LAB — POCT I-STAT TROPONIN I: Troponin i, poc: 0 ng/mL (ref 0.00–0.08)

## 2012-08-28 MED ORDER — INDOMETHACIN ER 75 MG PO CPCR
75.0000 mg | ORAL_CAPSULE | Freq: Two times a day (BID) | ORAL | Status: DC
  Administered 2012-08-29: 75 mg via ORAL
  Filled 2012-08-28 (×4): qty 1

## 2012-08-28 MED ORDER — COLCHICINE 0.6 MG PO TABS
0.6000 mg | ORAL_TABLET | Freq: Every day | ORAL | Status: DC
  Administered 2012-08-29 – 2012-08-30 (×2): 0.6 mg via ORAL
  Filled 2012-08-28 (×2): qty 1

## 2012-08-28 MED ORDER — TERBINAFINE HCL 250 MG PO TABS
250.0000 mg | ORAL_TABLET | Freq: Every evening | ORAL | Status: DC
  Administered 2012-08-29: 250 mg via ORAL
  Filled 2012-08-28 (×2): qty 1

## 2012-08-28 MED ORDER — METHOCARBAMOL 500 MG PO TABS: 250.0000 mg | ORAL_TABLET | Freq: Four times a day (QID) | ORAL | Status: DC | PRN

## 2012-08-28 MED ORDER — ASPIRIN 325 MG PO TABS
325.0000 mg | ORAL_TABLET | Freq: Every day | ORAL | Status: DC
  Administered 2012-08-29 – 2012-08-30 (×2): 325 mg via ORAL
  Filled 2012-08-28 (×3): qty 1

## 2012-08-28 MED ORDER — SENNOSIDES-DOCUSATE SODIUM 8.6-50 MG PO TABS: 1.0000 | ORAL_TABLET | Freq: Every evening | ORAL | Status: DC | PRN

## 2012-08-28 MED ORDER — ENOXAPARIN SODIUM 40 MG/0.4ML ~~LOC~~ SOLN
40.0000 mg | Freq: Every day | SUBCUTANEOUS | Status: DC
  Administered 2012-08-29 – 2012-08-30 (×2): 40 mg via SUBCUTANEOUS
  Filled 2012-08-28 (×2): qty 0.4

## 2012-08-28 MED ORDER — ASPIRIN 325 MG PO TABS: 325.0000 mg | ORAL_TABLET | Freq: Every day | ORAL | Status: DC

## 2012-08-28 MED ORDER — ALLOPURINOL 300 MG PO TABS
300.0000 mg | ORAL_TABLET | Freq: Every morning | ORAL | Status: DC
  Administered 2012-08-29 – 2012-08-30 (×2): 300 mg via ORAL
  Filled 2012-08-28 (×2): qty 1

## 2012-08-28 MED ORDER — SIMVASTATIN 10 MG PO TABS
10.0000 mg | ORAL_TABLET | Freq: Every day | ORAL | Status: DC
  Administered 2012-08-29: 10 mg via ORAL
  Filled 2012-08-28 (×2): qty 1

## 2012-08-28 MED ORDER — GABAPENTIN 100 MG PO CAPS
100.0000 mg | ORAL_CAPSULE | Freq: Three times a day (TID) | ORAL | Status: DC | PRN
Start: 2012-08-28 — End: 2012-08-30
  Filled 2012-08-28: qty 1

## 2012-08-28 NOTE — ED Provider Notes (Signed)
History     CSN: [id number]  Arrival date & time 08/28/12  1512   First MD Initiated Contact with Patient 08/28/12 1820      Chief Complaint  Patient presents with  . Dizziness    (Consider location/radiation/quality/duration/timing/severity/associated sxs/prior treatment) HPI This 72 year old male lives at home with family at baseline uses a walker and has left-sided hemiparesis and mild left facial droop at baseline. He was last known well at 3 days ago in 2 days ago woke up with worse than baseline left-sided arm and leg weakness as well as generalized weakness and intermittent positional vertigo without nausea or vomiting. He is no headache and no trauma. He is no neck pain. He has chronic stable back pain. He is no chest pain or shortness of breath. He is no abdominal pain. He is no vomiting or diarrhea. He is no change in speech vision or swallowing or understanding. He is no new numbness. He is having more difficulty walking with his walker falling to one side or another complaining of left-sided weakness worse than usual, worse than usual generalized weakness, and intermittent vertigo without syncope or trauma. He does take an aspirin a day otherwise there is no treatment prior to arrival. He is not a code stroke candidate due to his symptom onset over 48 hours ago. Past Medical History  Diagnosis Date  . S/P lumbar fusion   . Hypertension   . Diabetes mellitus   . Cataract   . Stroke     Past Surgical History  Procedure Date  . Cataract extraction   . Knee surgery   . Spine surgery   . Eye surgery   . Neck surgery   . Tonsilectomy, adenoidectomy, bilateral myringotomy and tubes     Family History  Problem Relation Age of Onset  . Stroke Mother     History  Substance Use Topics  . Smoking status: Former Games developer  . Smokeless tobacco: Former Neurosurgeon  . Alcohol Use: No      Review of Systems 10 Systems reviewed and are negative for acute change except as noted in  the HPI. Allergies  Review of patient's allergies indicates no known allergies.  Home Medications   No current outpatient prescriptions on file.  BP 128/65  Pulse 63  Temp 98.1 F (36.7 C) (Oral)  Resp 17  Ht 5\' 8"  (1.727 m)  Wt 228 lb 6.3 oz (103.6 kg)  BMI 34.73 kg/m2  SpO2 100%  Physical Exam  Nursing note and vitals reviewed. Constitutional:       Awake, alert, nontoxic appearance with baseline speech for patient.  HENT:  Head: Atraumatic.  Mouth/Throat: No oropharyngeal exudate.  Eyes: EOM are normal. Pupils are equal, round, and reactive to light. Right eye exhibits no discharge. Left eye exhibits no discharge.       No nystagmus noted  Neck: Neck supple.  Cardiovascular: Normal rate and regular rhythm.   No murmur heard. Pulmonary/Chest: Effort normal and breath sounds normal. No stridor. No respiratory distress. He has no wheezes. He has no rales. He exhibits no tenderness.  Abdominal: Soft. Bowel sounds are normal. He exhibits no mass. There is no tenderness. There is no rebound.  Musculoskeletal: He exhibits no tenderness.       Baseline ROM, moves extremities with new left-sided focal weakness.  Lymphadenopathy:    He has no cervical adenopathy.  Neurological: He is alert.       Awake, alert, cooperative and aware of situation; motor strength 5  out of 5 right arm and right leg 4-5 left arm and left leg; sensation normal to light touch bilaterally; peripheral visual fields full to confrontation; mild left droop facial asymmetry; tongue midline; major cranial nerves otherwise appear intact; positive abnormal left arm and leg pronator drift, normal finger to nose bilaterally, baseline gait with left-sided weakness, shuffling, and questionable ataxia.  Skin: No rash noted.  Psychiatric: He has a normal mood and affect.    ED Course  Procedures (including critical care time) ECG: Sinus rhythm, ventricular rate 68, normal axis, normal intervals, nonspecific T wave  changes, no comparison ECG immediately available  When called back from PCP coverage, rec [facility] admit (paged). 89 [facility] hospitalist will see in ED to arrange transfer to [facility] for stroke admit (Neuro paged). 2030 D/w Neuro will see the patient when he is transferred to [facility].2035 Labs Reviewed  CBC - Abnormal; Notable for the following:    RBC 4.03 (*)     Hemoglobin 11.6 (*)     HCT 34.7 (*)     All other components within normal limits  BASIC METABOLIC PANEL - Abnormal; Notable for the following:    Glucose, Bld 105 (*)     BUN 24 (*)     GFR calc non Af Amer 52 (*)     GFR calc Af Amer 61 (*)     All other components within normal limits  GLUCOSE, CAPILLARY - Abnormal; Notable for the following:    Glucose-Capillary 104 (*)     All other components within normal limits  HEPATIC FUNCTION PANEL - Abnormal; Notable for the following:    Total Bilirubin 0.2 (*)     All other components within normal limits  LIPID PANEL - Abnormal; Notable for the following:    Triglycerides 161 (*)     HDL 38 (*)     LDL Cholesterol 116 (*)     All other components within normal limits  CBC - Abnormal; Notable for the following:    RBC 4.09 (*)     Hemoglobin 11.6 (*)     HCT 34.9 (*)     All other components within normal limits  CREATININE, SERUM - Abnormal; Notable for the following:    GFR calc non Af Amer 58 (*)     GFR calc Af Amer 67 (*)     All other components within normal limits  POCT I-STAT TROPONIN I  ETHANOL  URINE RAPID DRUG SCREEN (HOSP PERFORMED)  URINALYSIS, ROUTINE W REFLEX MICROSCOPIC  PROTIME-INR  LAB REPORT - SCANNED  HEMOGLOBIN A1C   Ct Head Wo Contrast  08/28/2012  *RADIOLOGY REPORT*  Clinical Data: Dizziness and lightheadedness for the past 3 days.  CT HEAD WITHOUT CONTRAST  Technique:  Contiguous axial images were obtained from the base of the skull through the vertex without contrast.  Comparison: Head CT 05/15/2011.  Findings: New area of poorly defined low  attenuation in the medial aspect of the right frontal lobe, favored to represent subacute ischemia in the right anterior cerebral artery distribution.  There is loss of the normal gray-white differentiation throughout these regions.  No evidence of acute intracerebral hemorrhage, mass, mass effect, hydrocephalus or other abnormal intra or extra-axial fluid collections. Visualized paranasal sinuses and mastoids are well pneumatized, with exception of a large left mastoid effusion which appears to be chronic and is similar to prior study from 05/15/2011.  No acute displaced skull fractures are identified.  IMPRESSION: 1.  Findings, as above, concerning  for subacute ischemia in the right anterior cerebral artery distribution. 2.  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  These results were called by telephone on 08/28/2012 at 07:00 p.m. to Dr. Fonnie Jarvis, who verbally acknowledged these results.   Original Report Authenticated By: Trudie Reed, M.D.      1. Stroke   2. L-S radiculopathy   3. Left hemiparesis   4. Hypertension   5. Diabetes mellitus type 2 in nonobese       MDM  Pt stable in ED with no significant deterioration in condition.Patient / Family / Caregiver informed of clinical course, understand medical decision-making process, and agree with plan.  The patient appears reasonably stabilized for transfer considering the current resources, flow, and capabilities available in the ED at this time, and I doubt any other [facility] requiring further screening and/or treatment in the ED prior to transfer.         Hurman Horn, MD 08/29/12 [phone]

## 2012-08-28 NOTE — H&P (Signed)
[facility] History and Physical  Luis Poole ZOX:[id number] DOB: [birth year] DOA: 08/28/2012  Referring physician: Dr. Fonnie Jarvis PCP: [name], MD  Chief Complaint: dizziness and left lower extremity weakness since 2 days  HPI:  72 year old male with history of right-sided CVA with residual left-sided weakness, hypertension, diabetes mellitus (not on any medications), history of L5-S1 radiculopathy with lumbar fusion and follows with physiatry as outpatient who was brought in by his daughter for feeling lightheaded and unsteady since 2 days. He also has been noticing increased weakness in his left lower leg. Patient denies any headache or blurry vision, syncopal episode, nausea, vomiting, tinnitus, chest pain, palpitations, shortness of breath, abdominal pain, bowel or urinary symptoms. Denies any fever or chills. He denies tingling or numbness in his extremities. In the ED his vitals were stable. A head CT was obtained given his symptoms which suggested a subacute ischemia over right anterior cerebral artery distribution. Code stroke was not called as symptoms were ongoing for past 2 days. [facility] hospitalist called to admit patient. On my evaluation patient informs that the has been ambulatory the with a walker for past 2 days he has been increasingly dizzy. He denies any dysphagia. He informs being compliant with his medications.   Review of Systems:  Constitutional: Denies fever, chills, diaphoresis, appetite change and fatigue.  HEENT: Denies photophobia, eye pain, redness, hearing loss, ear pain, congestion, sore throat, rhinorrhea, sneezing, mouth sores, trouble swallowing, neck pain, neck stiffness and tinnitus.   Respiratory: Denies SOB, DOE, cough, chest tightness,  and wheezing.   Cardiovascular: Denies chest pain, palpitations and leg swelling.  Gastrointestinal: Denies nausea, vomiting, abdominal pain, diarrhea, constipation, blood in stool and abdominal distention.    Genitourinary: Denies dysuria, urgency, frequency, hematuria, flank pain and difficulty urinating.  Musculoskeletal: Denies myalgias, back pain, joint swelling, arthralgias , has unsteady gait  Skin: Denies pallor, rash and wound.  Neurological: Positive for dizziness , weakness and unsteadiness, Denies  seizures, syncope,  light-headedness, numbness and headaches.  Hematological: Denies adenopathy. Easy bruising, personal or family bleeding history  Psychiatric/Behavioral: Denies suicidal ideation, mood changes, confusion, nervousness, sleep disturbance and agitation   Past Medical History  Diagnosis Date  . S/P lumbar fusion   . Hypertension   . Diabetes mellitus   . Cataract   . Stroke    Past Surgical History  Procedure Date  . Cataract extraction   . Knee surgery   . Spine surgery   . Eye surgery   . Neck surgery   . Tonsilectomy, adenoidectomy, bilateral myringotomy and tubes    Social History:  reports that he has quit smoking. He has quit using smokeless tobacco. He reports that he does not drink alcohol or use illicit drugs.  No Known Allergies  Family History  Problem Relation Age of Onset  . Stroke Mother     Prior to Admission medications   Medication Sig Start Date End Date Taking? Authorizing Provider  allopurinol (ZYLOPRIM) 300 MG tablet Take 300 mg by mouth every morning.   Yes Historical Provider, MD  amLODipine (NORVASC) 10 MG tablet Take 10 mg by mouth every morning.  12/19/11  Yes Historical Provider, MD  aspirin EC 81 MG tablet Take 81 mg by mouth every morning.   Yes Historical Provider, MD  cloNIDine (CATAPRES - DOSED IN MG/24 HR) 0.3 mg/24hr Place 1 patch onto the skin once a week.  11/08/11  Yes Historical Provider, MD  COLCRYS 0.6 MG tablet Take 0.6 mg by mouth daily as  needed. gout 12/19/11  Yes Historical Provider, MD  gabapentin (NEURONTIN) 100 MG capsule Take 100 mg by mouth 3 (three) times daily as needed. Nerve pain 01/15/12 01/14/13 Yes Ranelle Oyster, MD  indomethacin (INDOCIN SR) 75 MG CR capsule Take 75 mg by mouth 2 (two) times daily with a meal.   Yes Historical Provider, MD  lisinopril (PRINIVIL,ZESTRIL) 40 MG tablet Take 40 mg by mouth every morning.  12/19/11  Yes Historical Provider, MD  methocarbamol (ROBAXIN) 500 MG tablet Take 250-500 mg by mouth every 6 (six) hours as needed. Muscle spasms 05/15/12  Yes Ranelle Oyster, MD  pravastatin (PRAVACHOL) 10 MG tablet Take 10 mg by mouth every evening.  12/17/11  Yes Historical Provider, MD  terbinafine (LAMISIL) 250 MG tablet Take 250 mg by mouth every evening.   Yes Historical Provider, MD  triamterene-hydrochlorothiazide (MAXZIDE-25) 37.5-25 MG per tablet Take 1 tablet by mouth every morning.  12/19/11  Yes Historical Provider, MD    Physical Exam:  Filed Vitals:   08/28/12 1536  BP: 184/83  Pulse: 73  Temp: 98.2 F (36.8 C)  TempSrc: Oral  Resp: 22  Height: 5\' 8"  (1.727 m)  Weight: 104.781 kg (231 lb)  SpO2: 100%    Constitutional: Vital signs reviewed.  Patient is a well-developed and well-nourished in no acute distress and cooperative with exam. Alert and oriented x3.  Head: Normocephalic and atraumatic Ear: TM normal bilaterally Mouth: no erythema or exudates, MMM Eyes: PERRL, EOMI, conjunctivae normal, No scleral icterus.  Neck: Supple, Trachea midline normal ROM, No JVD, mass, thyromegaly, or carotid bruit present.  Cardiovascular: RRR, S1 normal, S2 normal, no MRG, pulses symmetric and intact bilaterally Pulmonary/Chest: CTAB, no wheezes, rales, or rhonchi Abdominal: Soft. Non-tender, non-distended, bowel sounds are normal, no masses, organomegaly, or guarding present.  GU: no CVA tenderness Musculoskeletal: No joint deformities, erythema, or stiffness, ROM full and no nontender Ext: no edema and no cyanosis, pulses palpable bilaterally (DP and PT), dry skin with long  toenails Hematology: no cervical, inginal, or axillary adenopathy.  Neurological: A&O x3,  strength 4/5 in left lower extremity. Normal muscle tone and reflex. Plantar noted to be upgoing on left side. Normal strength in bilateral upper extremity and right lower extremity. Strenght is normal and symmetric bilaterally, cranial nerve II-XII are grossly intact, no focal motor deficit, sensory intact to light touch bilaterally.  Skin: Warm, dry and intact. No rash, cyanosis, or clubbing.  Psychiatric: Normal mood and affect. speech and behavior is normal. Judgment and thought content normal. Cognition and memory are normal.   Labs on Admission:  Basic Metabolic Panel:  Lab 08/28/12 1478  NA 135  K 3.7  CL 102  CO2 23  GLUCOSE 105*  BUN 24*  CREATININE 1.34  CALCIUM 9.5  MG --  PHOS --   Liver Function Tests:  Lab 08/28/12 1900  AST 17  ALT 12  ALKPHOS 70  BILITOT 0.2*  PROT 7.9  ALBUMIN 3.8   No results found for this basename: LIPASE:5,AMYLASE:5 in the last 168 hours No results found for this basename: AMMONIA:5 in the last 168 hours CBC:  Lab 08/28/12 1606  WBC 6.2  NEUTROABS --  HGB 11.6*  HCT 34.7*  MCV 86.1  PLT 211   Cardiac Enzymes: No results found for this basename: CKTOTAL:5,CKMB:5,CKMBINDEX:5,TROPONINI:5 in the last 168 hours BNP: No components found with this basename: POCBNP:5 CBG:  Lab 08/28/12 1559  GLUCAP 104*    Radiological Exams on Admission: Ct  Head Wo Contrast  08/28/2012  *RADIOLOGY REPORT*  Clinical Data: Dizziness and lightheadedness for the past 3 days.  CT HEAD WITHOUT CONTRAST  Technique:  Contiguous axial images were obtained from the base of the skull through the vertex without contrast.  Comparison: Head CT 05/15/2011.  Findings: New area of poorly defined low attenuation in the medial aspect of the right frontal lobe, favored to represent subacute ischemia in the right anterior cerebral artery distribution.  There is loss of the normal gray-white differentiation throughout these regions.  No evidence of acute intracerebral  hemorrhage, mass, mass effect, hydrocephalus or other abnormal intra or extra-axial fluid collections. Visualized paranasal sinuses and mastoids are well pneumatized, with exception of a large left mastoid effusion which appears to be chronic and is similar to prior study from 05/15/2011.  No acute displaced skull fractures are identified.  IMPRESSION: 1.  Findings, as above, concerning for subacute ischemia in the right anterior cerebral artery distribution. 2.  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  These results were called by telephone on 08/28/2012 at 07:00 p.m. to Dr. Fonnie Jarvis, who verbally acknowledged these results.   Original Report Authenticated By: Trudie Reed, M.D.     EKG: Normal sinus rhythm with mild T wave flattening in lateral leads  Assessment/Plan Principal Problem:  *CVA (cerebral vascular accident) Will transfer patient to [facility] for admission to telemetry with a neurology followup. Neurology consult has been called by the ED. Patient has new left lower extremity weakness which is likely from his stroke over the right anterior cerebral artery distribution seen on head CT. Patient has been evaluated for bedside swallow eval and has cleared it. -I ordered for a full dose aspirin. He is on baby aspirin at home. -Continue neurochecks. -Order for MRI brain, 2-D echo, and carotid Dopplers. PT OT eval. -Check hemoglobin A1c and lipid panel in the morning -Patient is on low dose statin which have continued. -Ordered chest x-ray.  Active Problems:  L-S radiculopathy Has had spinal fusion in the past Denies any pain at this time. Continue home dose medications  Hypertension Will allow permissive hypertension. He is on multiple medications and I will hold them for now.  Diabetes mellitus Diet control and not on any medications. Continue with sliding scale insulin for now.  DVT prophylaxis  Diet: diabetic  Code Status:  Full Family Communication: Daughter at bedside was updated Disposition Plan: We'll transfer patient to [facility] Pager [phone]  If 7PM-7AM, please contact night-coverage [email or web address] Password [email or web address] 08/28/2012, 8:56 PM    Total time spent: 70 MINUTES

## 2012-08-28 NOTE — ED Notes (Signed)
Report to [facility] 

## 2012-08-28 NOTE — ED Notes (Signed)
Pt presenting to ed with c/o dizziness and lightheadedness x 3 days. Pt states he feels like he's about to pass out. Pt states positive nausea no vomiting pt states he has taken his blood pressure medications today. Pt denies chest pain at this time. Pt denies shortness of breath at this time

## 2012-08-29 ENCOUNTER — Inpatient Hospital Stay (HOSPITAL_COMMUNITY): Payer: Medicare Other

## 2012-08-29 ENCOUNTER — Encounter (HOSPITAL_COMMUNITY): Payer: Self-pay | Admitting: Rehabilitation

## 2012-08-29 DIAGNOSIS — E1169 Type 2 diabetes mellitus with other specified complication: Secondary | ICD-10-CM

## 2012-08-29 DIAGNOSIS — E119 Type 2 diabetes mellitus without complications: Secondary | ICD-10-CM

## 2012-08-29 DIAGNOSIS — I10 Essential (primary) hypertension: Secondary | ICD-10-CM

## 2012-08-29 DIAGNOSIS — I517 Cardiomegaly: Secondary | ICD-10-CM

## 2012-08-29 LAB — CBC
HCT: 34.9 % — ABNORMAL LOW (ref 39.0–52.0)
MCH: 28.4 pg (ref 26.0–34.0)
MCHC: 33.2 g/dL (ref 30.0–36.0)
MCV: 85.3 fL (ref 78.0–100.0)
Platelets: 191 10*3/uL (ref 150–400)
RDW: 13.3 % (ref 11.5–15.5)
WBC: 5.2 10*3/uL (ref 4.0–10.5)

## 2012-08-29 LAB — LIPID PANEL
Cholesterol: 186 mg/dL (ref 0–200)
HDL: 38 mg/dL — ABNORMAL LOW (ref >39)
Total CHOL/HDL Ratio: 4.9 RATIO
Triglycerides: 161 mg/dL — ABNORMAL HIGH (ref <150)

## 2012-08-29 LAB — HEMOGLOBIN A1C
Hgb A1c MFr Bld: 6.4 % — ABNORMAL HIGH (ref <5.7)
Mean Plasma Glucose: 137 mg/dL — ABNORMAL HIGH (ref <117)

## 2012-08-29 MED ORDER — LORAZEPAM 2 MG/ML IJ SOLN
1.0000 mg | Freq: Once | INTRAMUSCULAR | Status: AC
  Administered 2012-08-29: 1 mg via INTRAVENOUS

## 2012-08-29 MED ORDER — LORAZEPAM 2 MG/ML IJ SOLN
1.0000 mg | Freq: Once | INTRAMUSCULAR | Status: DC
  Filled 2012-08-29: qty 1

## 2012-08-29 MED ORDER — IOHEXOL 350 MG/ML SOLN
50.0000 mL | Freq: Once | INTRAVENOUS | Status: AC | PRN
  Administered 2012-08-29: 50 mL via INTRAVENOUS

## 2012-08-29 NOTE — Progress Notes (Signed)
[facility] PROGRESS NOTE  Luis Poole ZOX:[id number] DOB: Jun 20, 1941 DOA: 08/28/2012 PCP: Luis Cos, MD  Assessment/Plan: 1. Acute right ACA territory infarct: Stroke evaluation. Aspirin 325 mg daily. EKG: Sinus rhythm no acute changes. 2. 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 Hypertension: Allow permissive hypertension. 4. Diabetes mellitus: Stable. Diet controlled. Sliding-scale insulin. 5. History of lumbosacral radiculopathy  Code Status: Full code Family Communication: None present at bedside Disposition Plan: Pending further evaluation  Luis Sacks, MD  [facility] Team 4  Pager [phone] If 7PM-7AM, please contact night-coverage at [email or web address], password [facility] 08/29/2012, 11:11 AM  LOS: 1 day   Brief narrative: 72 year old male with history of right-sided CVA with residual left-sided weakness. was brought in by his daughter for feeling lightheaded and unsteady since 2 days. He also has been noticing increased weakness in his left lower leg. A head CT was obtained given his symptoms which suggested a subacute ischemia over right anterior cerebral artery distribution. Code stroke was not called as symptoms were ongoing for past 2 days. [facility] hospitalist called to admit patient.  Consultants:  Neurology  Physical therapy: Home health  Occupational therapy:  Procedures:  2-D echocardiogram:  Bilateral carotid ultrasound:  HPI/Subjective: Afebrile, vital signs stable. Strength about the same. No new complaints.  Objective: Filed Vitals:   08/29/12 0222 08/29/12 0526 08/29/12 0807 08/29/12 1007  BP: 170/89 149/79 145/84 128/65  Pulse: 66 65 63 63  Temp: 97.8 F (36.6 C) 97.8 F (36.6 C)  98.1 F (36.7 C)  TempSrc: Oral Oral  Oral  Resp: 16 18 18 17   Height:      Weight:      SpO2: 100% 100% 100% 100%    Intake/Output Summary (Last 24 hours) at 08/29/12 1111 Last data filed at 08/29/12 0527  Gross per 24 hour  Intake      0 ml  Output    525 ml  Net   -525 ml   Filed Weights   08/28/12 1536 08/28/12 2315  Weight: 104.781 kg (231 lb) 103.6 kg (228 lb 6.3 oz)    Exam:  General:  Appears calm and comfortable. Sitting in chair. Cardiovascular: RRR, no m/r/g. No LE edema. Telemetry: SR, no arrhythmias  Respiratory: CTA bilaterally, no w/r/r. Normal respiratory effort. Musculoskeletal: Mild weakness left upper and left lower extremity. Psychiatric: grossly normal mood and affect, speech fluent and appropriate  Data Reviewed: Basic Metabolic Panel:  Lab 08/28/12 8295 08/28/12 1606  NA -- 135  K -- 3.7  CL -- 102  CO2 -- 23  GLUCOSE -- 105*  BUN -- 24*  CREATININE 1.24 1.34  CALCIUM -- 9.5  MG -- --  PHOS -- --   Liver Function Tests:  Lab 08/28/12 1900  AST 17  ALT 12  ALKPHOS 70  BILITOT 0.2*  PROT 7.9  ALBUMIN 3.8   CBC:  Lab 08/28/12 2346 08/28/12 1606  WBC 5.2 6.2  NEUTROABS -- --  HGB 11.6* 11.6*  HCT 34.9* 34.7*  MCV 85.3 86.1  PLT 191 211   CBG:  Lab 08/28/12 1559  GLUCAP 104*   Studies: Ct Head Wo Contrast  08/28/2012  *RADIOLOGY REPORT*  Clinical Data: Dizziness and lightheadedness for the past 3 days.  CT HEAD WITHOUT CONTRAST  Technique:  Contiguous axial images were obtained from the base of the skull through the vertex without contrast.  Comparison: Head CT 05/15/2011.  Findings: New area of poorly defined low attenuation in the medial  aspect of the right frontal lobe, favored to represent subacute ischemia in the right anterior cerebral artery distribution.  There is loss of the normal gray-white differentiation throughout these regions.  No evidence of acute intracerebral hemorrhage, mass, mass effect, hydrocephalus or other abnormal intra or extra-axial fluid collections. Visualized paranasal sinuses and mastoids are well pneumatized, with exception of a large left mastoid effusion which appears to be chronic and is similar to prior  study from 05/15/2011.  No acute displaced skull fractures are identified.  IMPRESSION: 1.  Findings, as above, concerning for subacute ischemia in the right anterior cerebral artery distribution. 2.  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  These results were called by telephone on 08/28/2012 at 07:00 p.m. to Dr. Fonnie Poole, who verbally acknowledged these results.   Original Report Authenticated By: Trudie Reed, M.D.     Scheduled Meds:   . allopurinol  300 mg Oral q morning - 10a  . aspirin  325 mg Oral Daily  . colchicine  0.6 mg Oral Daily  . enoxaparin (LOVENOX) injection  40 mg Subcutaneous Daily  . indomethacin  75 mg Oral BID WC  . simvastatin  10 mg Oral q1800  . terbinafine  250 mg Oral QPM   Continuous Infusions:   Principal Problem:  *CVA (cerebral vascular accident) Active Problems:  L-S radiculopathy     Luis Sacks, MD  [facility] Team 4  Pager [phone] If 7PM-7AM, please contact night-coverage at [email or web address], password [facility] 08/29/2012, 11:11 AM  LOS: 1 day   Time spent: 20 minutes

## 2012-08-29 NOTE — Progress Notes (Signed)
  Echocardiogram 2D Echocardiogram has been performed.  Luis Poole FRANCES 08/29/2012, 12:02 PM

## 2012-08-29 NOTE — Consult Note (Addendum)
Reason for Consult: Stroke Referring Physician: Dhungel, N  CC: Dizziness  History is obtained from:patient  HPI: Luis Poole is a 72 y.o. male who has been having symptoms since Wednesday. He had dizziness when standing and has noticed that his left leg has been "heavier" than previously. He had weakness following a stroke in 2012, but states that it seems to have gotten worse. He was with his daughter earlier today and fell against a wall and she insisted he come to the ER. 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   LKW: Wednesday tpa given: no, out of window.   ROS: A 14 point ROS was performed and is negative except as noted in the HPI.  Past Medical History  Diagnosis Date  . S/P lumbar fusion   . Hypertension   . Diabetes mellitus   . Cataract   . Stroke     Family History: Mother - CVA  Social History: Tob: denies Lives with 42 yo son, 31 yo daughter, and 39 yo Son  Exam: Current vital signs: BP 169/90  Pulse 66  Temp 97.5 F (36.4 C) (Oral)  Resp 18  Ht 5\' 8"  (1.727 m)  Wt 103.6 kg (228 lb 6.3 oz)  BMI 34.73 kg/m2  SpO2 100% Vital signs in last 24 hours: Temp:  [97.5 F (36.4 C)-98.2 F (36.8 C)] 97.5 F (36.4 C) (01/10 2315) Pulse Rate:  [64-73] 66  (01/10 2315) Resp:  [17-22] 18  (01/10 2315) BP: (163-184)/(83-90) 169/90 mmHg (01/10 2315) SpO2:  [100 %] 100 % (01/10 2315) Weight:  [103.6 kg (228 lb 6.3 oz)-104.781 kg (231 lb)] 103.6 kg (228 lb 6.3 oz) (01/10 2315)  General: in bed, NAD CV: RRR Mental Status: Patient is awake, alert, oriented to person, place, month, year, and situation. Immediate and remote memory are intact. Patient is able to give a clear and coherent history. No signs of aphasia.  Cranial Nerves: II: Visual Fields are full. Pupils are equal, round, and reactive to light.  Discs are difficult to visualize. III,IV, VI: EOMI without ptosis or diploplia.  V: Facial sensation is symmetric to temperature VII: Facial  movement is symmetric.  VIII: hearing is intact to voice X: Uvula elevates symmetrically XI: Shoulder shrug is symmetric. XII: tongue is midline without atrophy or fasciculations.  Motor: Tone is normal. Bulk is normal. 5/5 strength was present on right, mild 4/5 weakness in left arm, 4/5 proximally in left leg, 2/5 distally.  Sensory: Sensation is diminished in left leg to LT and temp, intact in left arm and on right.  Deep Tendon Reflexes: 2+ and symmetric in the biceps and patellae.  Cerebellar: FNF  intact bilaterally Gait: Did not test 2/2 patient safety concerns.   I have reviewed labs in epic and the results pertinent to this consultation are: CBC mild anemia BMP unremarkable  I have reviewed the images obtained:CT head - ACA distribution hypodenisty  Impression: 72 yo M with right ACA territory infarct.   Recommendations: 1. HgbA1c, fasting lipid panel 2. MRI, MRA  of the brain without contrast 3. PT consult, OT consult, Speech consult 4. Echocardiogram 5. Carotid dopplers 6. Prophylactic therapy-Antiplatelet med: Aspirin - dose 325mg  7. Risk factor modification 8. Telemetry monitoring 9. Frequent neuro checks     Ritta Slot, MD [facility] [phone]  If 7pm- 7am, please page neurology on call at [phone].

## 2012-08-29 NOTE — Progress Notes (Signed)
Stroke Team Progress Note  HISTORY  Luis Poole is a 72 y.o. male who had been having symptoms since Wednesday. He had dizziness when standing and had noticed that his left leg had been "heavier" than previously. He had weakness following a stroke in 2012, but states that it seems to have gotten worse. He was with his daughter earlier today and fell against a wall and she insisted he come to the ER. There a CT showed a new right ACA territory infarct.  LKW: Wednesday  tpa given: no, out of window.      SUBJECTIVE No one at bedside.  Overall he feels his condition is improving. Dizziness resolved.  OBJECTIVE Most recent Vital Signs: Filed Vitals:   08/29/12 0222 08/29/12 0526 08/29/12 0807 08/29/12 1007  BP: 170/89 149/79 145/84 128/65  Pulse: 66 65 63 63  Temp: 97.8 F (36.6 C) 97.8 F (36.6 C)  98.1 F (36.7 C)  TempSrc: Oral Oral  Oral  Resp: 16 18 18 17   Height:      Weight:      SpO2: 100% 100% 100% 100%   CBG (last 3)   Basename 08/28/12 1559  GLUCAP 104*    IV Fluid Intake:     MEDICATIONS    . allopurinol  300 mg Oral q morning - 10a  . aspirin  325 mg Oral Daily  . colchicine  0.6 mg Oral Daily  . enoxaparin (LOVENOX) injection  40 mg Subcutaneous Daily  . simvastatin  10 mg Oral q1800  . terbinafine  250 mg Oral QPM   PRN:  gabapentin, methocarbamol, senna-docusate  Diet:  Carb Control thin liquids Activity:  Up with assistance DVT Prophylaxis:  Lovenox  CLINICALLY SIGNIFICANT STUDIES Basic Metabolic Panel:  Lab 08/28/12 4098 08/28/12 1606  NA -- 135  K -- 3.7  CL -- 102  CO2 -- 23  GLUCOSE -- 105*  BUN -- 24*  CREATININE 1.24 1.34  CALCIUM -- 9.5  MG -- --  PHOS -- --   Liver Function Tests:  Lab 08/28/12 1900  AST 17  ALT 12  ALKPHOS 70  BILITOT 0.2*  PROT 7.9  ALBUMIN 3.8   CBC:  Lab 08/28/12 2346 08/28/12 1606  WBC 5.2 6.2  NEUTROABS -- --  HGB 11.6* 11.6*  HCT 34.9* 34.7*  MCV 85.3 86.1  PLT 191 211   Coagulation:  Lab  08/28/12 1900  LABPROT 13.1  INR 1.00   Cardiac Enzymes: No results found for this basename: CKTOTAL:3,CKMB:3,CKMBINDEX:3,TROPONINI:3 in the last 168 hours Urinalysis:  Lab 08/28/12 1904  COLORURINE YELLOW  LABSPEC 1.017  PHURINE 5.5  GLUCOSEU NEGATIVE  HGBUR NEGATIVE  BILIRUBINUR NEGATIVE  KETONESUR NEGATIVE  PROTEINUR NEGATIVE  UROBILINOGEN 0.2  NITRITE NEGATIVE  LEUKOCYTESUR NEGATIVE   Lipid Panel    Component Value Date/Time   CHOL 186 08/29/2012 0555   TRIG 161* 08/29/2012 0555   HDL 38* 08/29/2012 0555   CHOLHDL 4.9 08/29/2012 0555   VLDL 32 08/29/2012 0555   LDLCALC 116* 08/29/2012 0555   HgbA1C  Lab Results  Component Value Date   HGBA1C 6.4* 05/11/2011    Urine Drug Screen:     Component Value Date/Time   LABOPIA NONE DETECTED 08/28/2012 1904   COCAINSCRNUR NONE DETECTED 08/28/2012 1904   LABBENZ NONE DETECTED 08/28/2012 1904   AMPHETMU NONE DETECTED 08/28/2012 1904   THCU NONE DETECTED 08/28/2012 1904   LABBARB NONE DETECTED 08/28/2012 1904    Alcohol Level:  Lab 08/28/12 1900  ETH <11    Ct Head Wo Contrast  08/28/2012    IMPRESSION: 1.  Findings, as above, concerning for subacute ischemia in the right anterior cerebral artery distribution. 2.  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         MRI of the brain  - Pt claustrophobic. Per Dr. Loretha Brasil get CT angio of head and neck.  2D Echocardiogram  - Pending   Carotid Doppler  - Pending   EKG  SR rate 68 BPM  Therapy Recommendations - Home health PT with 24 hour supervision  Physical Exam  Mental Status:  Patient is awake, alert, oriented to person, place, month, year, and situation.  Immediate and remote memory are intact.  Patient is able to give a clear and coherent history.  No signs of aphasia.  Cranial Nerves:  II: Visual Fields are full. Pupils are equal, round, and reactive to light. Discs are difficult to visualize.  III,IV, VI: EOMI without ptosis or  diploplia.  V: Facial sensation is symmetric to temperature  VII: Facial movement is symmetric.  VIII: hearing is intact to voice  X: Uvula elevates symmetrically  XI: Shoulder shrug is symmetric.  XII: tongue is midline without atrophy or fasciculations.  Motor:  Tone is normal. Bulk is normal. 5/5 strength was present on right, 5/5 LUE, 4/5 proximally in left leg, 2/5 distally which is chronic due to foot drop.     ASSESSMENT Luis Poole is a 72 y.o. male presenting with dizziness and left sided weakness. . No IV t-PA due to late presentation.  Imaging confirms subacute ischemia in the right anterior cerebral artery distribution. CT angio pending. Work up underway. On aspirin 81 mg orally every day prior to admission. Now on aspirin 325 mg orally every day for secondary stroke prevention. Patient with resultant left hemiparesis.   Previous CVA.  Left foot drop.  Htn.  DM  Previous tobacco use.  Dyslipidemia on statin  Hospital day # 1  TREATMENT/PLAN  Continue aspirin 325 mg orally every day for secondary stroke prevention.  CT angio of head and neck.  Await echo  Risk factor modification.  Delton See PA-C [facility] Pager [phone] 08/29/2012, 1:30 PM I have personally obtained a history, examined the patient, evaluated imaging results, and formulated the assessment and plan of care. I agree with the above.  As per pt he is back to his baseline today.  Titer glycemic control  HTN management.  If unable to tolerate MRI with sedation, then ok to go for CTA H/N

## 2012-08-29 NOTE — Evaluation (Signed)
Physical Therapy Evaluation Patient Details Name: Luis Poole MRN: [id number] DOB: [birth year] Today's Date: 08/29/2012 Time: 4098-1191 PT Time Calculation (min): 25 min  PT Assessment / Plan / Recommendation Clinical Impression  Patient is a 72 yo male admitted with new Rt. 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  Patient with LLE weakness impacting mobility.  Will benefit from acute PT to maximize independence prior to return home with family.    PT Assessment  Patient needs continued PT services    Follow Up Recommendations  Home health PT;Supervision/Assistance - 24 hour    Does the patient have the potential to tolerate intense rehabilitation      Barriers to Discharge        Equipment Recommendations  None recommended by PT    Recommendations for Other Services     Frequency Min 4X/week    Precautions / Restrictions Precautions Precautions: Fall Required Braces or Orthoses: Other Brace/Splint Other Brace/Splint: LLE dorsiflex assist (double upright metal brace attached to shoe) Restrictions Weight Bearing Restrictions: No   Pertinent Vitals/Pain       Mobility  Bed Mobility Bed Mobility: Supine to Sit;Sitting - Scoot to Edge of Bed Supine to Sit: 4: Min assist;With rails;HOB flat Sitting - Scoot to Edge of Bed: 4: Min guard Details for Bed Mobility Assistance: Verbal cues for technique.  Assist to raise trunk up to sitting Transfers Transfers: Sit to Stand;Stand to Sit Sit to Stand: 4: Min guard;With upper extremity assist;From bed Stand to Sit: 4: Min guard;With upper extremity assist;To bed;With armrests;To chair/3-in-1 Details for Transfer Assistance: Verbal cues for hand placement and safety. Ambulation/Gait Ambulation/Gait Assistance: 4: Min guard Ambulation Distance (Feet): 92 Feet Assistive device: Rolling walker Ambulation/Gait Assistance Details: Verbal cues to stand upright and look forward during gait. Gait Pattern: Step-through  pattern;Decreased stance time - left;Decreased step length - right;Trunk flexed Gait velocity: Slow gait speed           PT Diagnosis: Difficulty walking;Abnormality of gait;Hemiplegia non-dominant side  PT Problem List: Decreased strength;Decreased activity tolerance;Decreased balance;Decreased mobility PT Treatment Interventions: DME instruction;Gait training;Functional mobility training;Balance training;Patient/family education   PT Goals Acute Rehab PT Goals PT Goal Formulation: With patient Time For Goal Achievement: 09/05/12 Potential to Achieve Goals: Good Pt will go Supine/Side to Sit: Independently;with HOB 0 degrees PT Goal: Supine/Side to Sit - Progress: Goal set today Pt will go Sit to Supine/Side: Independently;with HOB 0 degrees PT Goal: Sit to Supine/Side - Progress: Goal set today Pt will go Sit to Stand: with supervision;with upper extremity assist PT Goal: Sit to Stand - Progress: Goal set today Pt will Ambulate: 51 - 150 feet;with supervision;with rolling walker PT Goal: Ambulate - Progress: Goal set today  Visit Information  Last PT Received On: 08/29/12 Assistance Needed: +1    Subjective Data  Subjective: "I get around OK" Patient Stated Goal: To go home soon.   Prior Functioning  Home Living Lives With: Son;Daughter (2 sons and 1 daughter) Available Help at Discharge: Family;Available 24 hours/day Type of Home: House Home Access: Ramped entrance Home Layout: One level Bathroom Shower/Tub: Engineer, manufacturing systems: Standard Home Adaptive Equipment: Bedside commode/3-in-1;Tub transfer bench;Walker - rolling;Quad cane;Wheelchair - manual Prior Function Level of Independence: Independent with assistive device(s);Needs assistance Needs Assistance: Light Housekeeping;Meal Prep Meal Prep: Total Light Housekeeping: Total Able to Take Stairs?: Yes (with assist) Driving: No Vocation: Retired Musician: No difficulties      Cognition  Overall Cognitive Status: Appears within functional  limits for tasks assessed/performed Arousal/Alertness: Awake/alert Orientation Level: Appears intact for tasks assessed Behavior During Session: [facility] for tasks performed    Extremity/Trunk Assessment Right Upper Extremity Assessment RUE ROM/Strength/Tone: WFL for tasks assessed RUE Sensation: WFL - Light Touch Left Upper Extremity Assessment LUE ROM/Strength/Tone: WFL for tasks assessed LUE Sensation: WFL - Light Touch Right Lower Extremity Assessment RLE ROM/Strength/Tone: WFL for tasks assessed RLE Sensation: WFL - Light Touch Left Lower Extremity Assessment LLE ROM/Strength/Tone: Deficits LLE ROM/Strength/Tone Deficits: Strength 4-/5 at hip/knee; 2/5 DF/PF LLE Sensation: WFL - Light Touch   Balance    End of Session PT - End of Session Equipment Utilized During Treatment: Gait belt;Left ankle foot orthosis (Double upright brace on Lt. shoe) Activity Tolerance: Patient tolerated treatment well Patient left: in chair;with call bell/phone within reach Nurse Communication: Mobility status  GP     Vena Austria 08/29/2012, 10:14 AM Durenda Hurt. Renaldo Fiddler, [facility] Acute Rehab Services Pager [phone]

## 2012-08-30 MED ORDER — ASPIRIN 325 MG PO TABS: 325.0000 mg | ORAL_TABLET | Freq: Every day | ORAL | Status: DC

## 2012-08-30 NOTE — Progress Notes (Signed)
[facility] PROGRESS NOTE  Luis Poole ZOX:[id number] DOB: Mar 25, 1941 DOA: 08/28/2012 PCP: [name], MD  Assessment/Plan: 1. Acute stroke: Stroke evaluation complete. Aspirin 325 mg daily per neurology. EKG: Sinus rhythm no acute changes. 2. 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 Hypertension: Stable. 4. Diabetes mellitus: Stable. Diet controlled. Sliding-scale insulin. 5. History of lumbosacral radiculopathy  Code Status: Full code Family Communication: None present at bedside Disposition Plan: Home with home health physical, occupational therapy, social  Brendia Sacks, MD  [facility] Team 4  Pager [phone] If 7PM-7AM, please contact night-coverage at [email or web address], password [facility] 08/30/2012, 2:45 PM  LOS: 2 days   Brief narrative: 72 year old male with history of right-sided CVA with residual left-sided weakness. was brought in by his daughter for feeling lightheaded and unsteady since 2 days. He also has been noticing increased weakness in his left lower leg. A head CT was obtained given his symptoms which suggested a subacute ischemia over right anterior cerebral artery distribution. Code stroke was not called as symptoms were ongoing for past 2 days. [facility] called to admit patient.  Consultants:  Neurology  Physical therapy: Home health  Occupational therapy: Home health, recommend social work consult  Procedures:  2-D echocardiogram: Normal LV size and systolic function, EF 60-65%. Mild LV hypertrophy. Normal RV size and systolic function. Mild biatrial enlargement  HPI/Subjective: No new problems. Feels well. Wants to go home.  Objective: Filed Vitals:   08/30/12 0640 08/30/12 0647 08/30/12 1004 08/30/12 1353  BP: 145/108 162/97 146/76 144/79  Pulse: 63  68 73  Temp: 97.6 F (36.4 C)  98 F (36.7 C) 98.7 F (37.1 C)  TempSrc: Oral  Oral Oral  Resp: 20  20 20   Height:      Weight:        SpO2: 100%  100% 99%    Intake/Output Summary (Last 24 hours) at 08/30/12 1445 Last data filed at 08/30/12 0900  Gross per 24 hour  Intake    240 ml  Output    400 ml  Net   -160 ml   Filed Weights   08/28/12 1536 08/28/12 2315  Weight: 104.781 kg (231 lb) 103.6 kg (228 lb 6.3 oz)    Exam:  General:  Appears calm and comfortable. Sitting in chair. Cardiovascular: RRR, no m/r/g. No LE edema. Telemetry: SR, no arrhythmia Respiratory: CTA bilaterally, no w/r/r. Normal respiratory effort. Psychiatric: grossly normal mood and affect, speech fluent and appropriate  Data Reviewed: Basic Metabolic Panel:  Lab 08/28/12 8295 08/28/12 1606  NA -- 135  K -- 3.7  CL -- 102  CO2 -- 23  GLUCOSE -- 105*  BUN -- 24*  CREATININE 1.24 1.34  CALCIUM -- 9.5  MG -- --  PHOS -- --   Liver Function Tests:  Lab 08/28/12 1900  AST 17  ALT 12  ALKPHOS 70  BILITOT 0.2*  PROT 7.9  ALBUMIN 3.8   CBC:  Lab 08/28/12 2346 08/28/12 1606  WBC 5.2 6.2  NEUTROABS -- --  HGB 11.6* 11.6*  HCT 34.9* 34.7*  MCV 85.3 86.1  PLT 191 211   CBG:  Lab 08/28/12 1559  GLUCAP 104*   Studies: Ct Angio Head W/cm &/or Wo Cm  08/29/2012  *RADIOLOGY REPORT*  Clinical Data:  72 year old male with dizziness and left side weakness.  Subacute right ACA infarct suspected on the basis of CT.  CT ANGIOGRAPHY HEAD AND NECK  Technique:  Multidetector CT imaging of  the head and neck was performed using the standard protocol during bolus administration of intravenous contrast.  Multiplanar CT image reconstructions including MIPs were obtained to evaluate the vascular anatomy. Carotid stenosis measurements (when applicable) are obtained utilizing NASCET criteria, using the distal internal carotid diameter as the denominator.  Contrast: 50mL OMNIPAQUE IOHEXOL 350 MG/ML SOLN  Comparison:  Head CT without contrast 08/28/2012.  C t a head neck 05/15/2011.  Brain MRI 05/11/2011.  CTA NECK  Findings:  Larger lung volumes.   Negative lung apices.  No superior mediastinal lymphadenopathy.  Negative thyroid, larynx, pharynx, parapharyngeal spaces, retropharyngeal space, sublingual space, submandibular and parotid glands.  Stable orbit soft tissues. Stable paranasal sinuses and mastoids, chronic left mastoid effusion. No acute osseous abnormality identified.  Lower cervical ACDF changes again noted.  No cervical lymphadenopathy.  Vascular Findings: Three-vessel arch configuration.  Stable minor arch and great vessel origin atherosclerosis.  No great vessel origin stenosis.  Tortuous right common carotid artery is stable.  Right carotid bifurcation is stable with circumferential mild soft and calcified plaque.  No right ICA origin stenosis.  No cervical right ICA stenosis.  Stable and normal right vertebral artery origin.  Stable chronic irregularity of the distal right vertebral artery at the C1 level just proximal to entering the dura (series 6 images 87 and 88). Stable mild stenosis here.  Stable tortuosity of the left common carotid artery.  Stable left carotid bifurcation with mild circumferential soft and calcified plaque.  No left ICA origin stenosis.  Stable tortuosity of the cervical left ICA which is otherwise normal.  Left vertebral artery origin is stable and within normal limits. Stable tortuosity of the proximal left vertebral.  Stable tortuosity of the distal left vertebral.  No left vertebral artery stenosis in the neck.   Review of the MIP images confirms the above findings.  IMPRESSION: 1.  Stable neck CTA since 2012. - Carotid atherosclerosis without stenosis. - Right vertebral artery mild irregularity and stenosis just below the skull base which might reflect chronic atherosclerosis or chronic dissection. 2.  No acute findings in the neck.  See intracranial findings below.  CTA HEAD  Findings:  Right ACA territory encephalomalacia at the vertex and extending to the right splenium.  Posterior ACA and splenium infarct was  evident on the previous MRI, with minimal changes evident at the time of the comparison CTA. No acute intracranial hemorrhage identified.  No ventriculomegaly. No midline shift, mass effect, or evidence of mass lesion.  Stable gray-white matter differentiation elsewhere. No abnormal enhancement identified. Calvarium stable and intact.  Vascular Findings: Major intracranial venous structures appear to be enhancing.  Stable distal vertebral arteries, the left is mildly dominant. Stable and patent bilateral PICA vessels.  Mild stenosis of the distal right vertebral artery just proximal to the vertebrobasilar junction is stable.  Basilar artery is stable with intermittent irregularity, but remains patent and is without focal hemodynamically significant stenosis.  SCA and PCA origins are stable and patent.  Left posterior communicating artery is within normal limits.  The right is diminutive or absent.  Bilateral PCA branch irregularity is stable and maximal in the distal left P2 segment were there is moderate stenosis.  Stable distal PCA enhancement.  Bilateral ICA siphons are stable with moderate to severe cavernous calcified atherosclerosis, greater on the right.  Moderate right cavernous ICA stenosis is stable.  No hemodynamically significant stenosis suspected on the left.  Supraclinoid ICA segments are patent and stable.  Left posterior communicating artery origin  remains normal.  Carotid termini are patent and stable.  MCA and ACA origins remain within normal limits.  The left ACA A1 segment is mildly dominant as before.  Anterior communicating artery gives rise to a median artery of the corpus callosum as before.  Visualized ACA branches are stable, with no focal ACA stenosis or major branch occlusion identified compared the prior.  MCA M1 segment are stable and patent with mild irregularity greater on the left.  Right MCA branches are stable and within normal limits.  There is a new moderate to severe stenosis  at the left MCA bifurcation (series 45409 images 243 - 09/1943 and series 03/26/1939 image 26).  Despite this, the left MCA branches distally are stable and within normal limits.   Review of the MIP images confirms the above findings.  IMPRESSION: 1.  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 Despite this, no major left MCA branch occlusion is identified. 2.  Otherwise stable anterior circulation.  No change in the CTA appearance of the ACA branches. 3.  Stable posterior circulation atherosclerosis and stenosis as above. 4. Stable CT appearance of the right ACA territory since yesterday, favor this is a chronic infarct that probably was acute at that time of the 2012 CTA comparison. MRI would confirm if necessary.   Original Report Authenticated By: Erskine Speed, M.D.    Dg Chest 2 View  08/29/2012  *RADIOLOGY REPORT*  Clinical Data: Stroke.  Cough.  CHEST - 2 VIEW  Comparison: Chest x-ray 05/24/2011.  Findings: Lung volumes are slightly low.  No consolidative airspace disease.  No pleural effusions.  No pneumothorax.  No pulmonary nodule or mass noted.  Pulmonary vasculature and the cardiomediastinal silhouette are within normal limits.  Orthopedic fixation hardware in the lower cervical spine.  IMPRESSION: 1. Low lung volumes without radiographic evidence of acute cardiopulmonary disease.   Original Report Authenticated By: Trudie Reed, M.D.    Ct Head Wo Contrast  08/28/2012  *RADIOLOGY REPORT*  Clinical Data: Dizziness and lightheadedness for the past 3 days.  CT HEAD WITHOUT CONTRAST  Technique:  Contiguous axial images were obtained from the base of the skull through the vertex without contrast.  Comparison: Head CT 05/15/2011.  Findings: New area of poorly defined low attenuation in the medial aspect of the right frontal lobe, favored to represent subacute ischemia in the right anterior cerebral artery distribution.  There is loss of the normal gray-white differentiation throughout  these regions.  No evidence of acute intracerebral hemorrhage, mass, mass effect, hydrocephalus or other abnormal intra or extra-axial fluid collections. Visualized paranasal sinuses and mastoids are well pneumatized, with exception of a large left mastoid effusion which appears to be chronic and is similar to prior study from 05/15/2011.  No acute displaced skull fractures are identified.  IMPRESSION: 1.  Findings, as above, concerning for subacute ischemia in the right anterior cerebral artery distribution. 2.  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  These results were called by telephone on 08/28/2012 at 07:00 p.m. to Dr. Fonnie Jarvis, who verbally acknowledged these results.   Original Report Authenticated By: Trudie Reed, M.D.    Ct Angio Neck W/cm &/or Wo/cm  08/29/2012  *RADIOLOGY REPORT*  Clinical Data:  72 year old male with dizziness and left side weakness.  Subacute right ACA infarct suspected on the basis of CT.  CT ANGIOGRAPHY HEAD AND NECK  Technique:  Multidetector CT imaging of the head and neck was performed using the  standard protocol during bolus administration of intravenous contrast.  Multiplanar CT image reconstructions including MIPs were obtained to evaluate the vascular anatomy. Carotid stenosis measurements (when applicable) are obtained utilizing NASCET criteria, using the distal internal carotid diameter as the denominator.  Contrast: 50mL OMNIPAQUE IOHEXOL 350 MG/ML SOLN  Comparison:  Head CT without contrast 08/28/2012.  C t a head neck 05/15/2011.  Brain MRI 05/11/2011.  CTA NECK  Findings:  Larger lung volumes.  Negative lung apices.  No superior mediastinal lymphadenopathy.  Negative thyroid, larynx, pharynx, parapharyngeal spaces, retropharyngeal space, sublingual space, submandibular and parotid glands.  Stable orbit soft tissues. Stable paranasal sinuses and mastoids, chronic left mastoid effusion. No acute osseous abnormality identified.  Lower  cervical ACDF changes again noted.  No cervical lymphadenopathy.  Vascular Findings: Three-vessel arch configuration.  Stable minor arch and great vessel origin atherosclerosis.  No great vessel origin stenosis.  Tortuous right common carotid artery is stable.  Right carotid bifurcation is stable with circumferential mild soft and calcified plaque.  No right ICA origin stenosis.  No cervical right ICA stenosis.  Stable and normal right vertebral artery origin.  Stable chronic irregularity of the distal right vertebral artery at the C1 level just proximal to entering the dura (series 6 images 87 and 88). Stable mild stenosis here.  Stable tortuosity of the left common carotid artery.  Stable left carotid bifurcation with mild circumferential soft and calcified plaque.  No left ICA origin stenosis.  Stable tortuosity of the cervical left ICA which is otherwise normal.  Left vertebral artery origin is stable and within normal limits. Stable tortuosity of the proximal left vertebral.  Stable tortuosity of the distal left vertebral.  No left vertebral artery stenosis in the neck.   Review of the MIP images confirms the above findings.  IMPRESSION: 1.  Stable neck CTA since 2012. - Carotid atherosclerosis without stenosis. - Right vertebral artery mild irregularity and stenosis just below the skull base which might reflect chronic atherosclerosis or chronic dissection. 2.  No acute findings in the neck.  See intracranial findings below.  CTA HEAD  Findings:  Right ACA territory encephalomalacia at the vertex and extending to the right splenium.  Posterior ACA and splenium infarct was evident on the previous MRI, with minimal changes evident at the time of the comparison CTA. No acute intracranial hemorrhage identified.  No ventriculomegaly. No midline shift, mass effect, or evidence of mass lesion.  Stable gray-white matter differentiation elsewhere. No abnormal enhancement identified. Calvarium stable and intact.   Vascular Findings: Major intracranial venous structures appear to be enhancing.  Stable distal vertebral arteries, the left is mildly dominant. Stable and patent bilateral PICA vessels.  Mild stenosis of the distal right vertebral artery just proximal to the vertebrobasilar junction is stable.  Basilar artery is stable with intermittent irregularity, but remains patent and is without focal hemodynamically significant stenosis.  SCA and PCA origins are stable and patent.  Left posterior communicating artery is within normal limits.  The right is diminutive or absent.  Bilateral PCA branch irregularity is stable and maximal in the distal left P2 segment were there is moderate stenosis.  Stable distal PCA enhancement.  Bilateral ICA siphons are stable with moderate to severe cavernous calcified atherosclerosis, greater on the right.  Moderate right cavernous ICA stenosis is stable.  No hemodynamically significant stenosis suspected on the left.  Supraclinoid ICA segments are patent and stable.  Left posterior communicating artery origin remains normal.  Carotid termini are patent and  stable.  MCA and ACA origins remain within normal limits.  The left ACA A1 segment is mildly dominant as before.  Anterior communicating artery gives rise to a median artery of the corpus callosum as before.  Visualized ACA branches are stable, with no focal ACA stenosis or major branch occlusion identified compared the prior.  MCA M1 segment are stable and patent with mild irregularity greater on the left.  Right MCA branches are stable and within normal limits.  There is a new moderate to severe stenosis at the left MCA bifurcation (series 45409 images 243 - 09/1943 and series 03/26/1939 image 26).  Despite this, the left MCA branches distally are stable and within normal limits.   Review of the MIP images confirms the above findings.  IMPRESSION: 1.  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 Despite this, no major  left MCA branch occlusion is identified. 2.  Otherwise stable anterior circulation.  No change in the CTA appearance of the ACA branches. 3.  Stable posterior circulation atherosclerosis and stenosis as above. 4. Stable CT appearance of the right ACA territory since yesterday, favor this is a chronic infarct that probably was acute at that time of the 2012 CTA comparison. MRI would confirm if necessary.   Original Report Authenticated By: Erskine Speed, M.D.    Mr [facility] Wo Contrast  08/29/2012  *RADIOLOGY REPORT*  Clinical Data:  72 year old male with dizziness and left side weakness.  Left MCA stenosis on CTA.  History of right ACA infarct.  Comparison: Head and neck CTA earlier the same day.  Brain MRI 05/11/2011.  MRI HEAD WITHOUT CONTRAST  Technique: Multiplanar, multiecho pulse sequences of the brain and surrounding structures were obtained according to standard protocol without intravenous contrast.  Findings: Recent CT findings reflect chronic posterior right ACA territory encephalomalacia.  However, there is bilateral splenium diffusion restriction, greater on the right associated with generalized increased T2 and FLAIR hyperintensity in the splenium and mild enlargement. The right splenium was more abnormally enlarged in 2012.  No associated hemorrhage.  No MCA territory diffusion restriction.  Diffusion elsewhere within normal limits.  Major intracranial vascular flow voids are stable since 2012.  No other areas of new signal abnormality since 2012.  Mild chronic blood products associated with the chronic right ACA encephalomalacia.  Chronic partially empty sella.  Grossly stable cervicomedullary junction visualized cervical spine.  Normal bone marrow signal.  Stable orbits.  Chronic left mastoid effusion.  Otherwise stable and grossly normal visualized internal auditory structures.  IMPRESSION: 1.  Restricted diffusion in the splenium of the corpus callosum, left greater than right.  Associated T2 and  FLAIR hyperintensity. The right splenium has a more normal appearance than at the time of the 2012 infarct. 2.  Right ACA territory findings are chronic encephalomalacia. 3.  No other acute intracranial abnormality.  MRA findings below.  MRA HEAD WITHOUT CONTRAST  Technique: Angiographic images of the Circle of Willis were obtained using MRA technique without  intravenous contrast.  Findings: Study is moderately degraded by motion artifact despite repeated imaging attempts.  Antegrade flow in the posterior circulation.  Distal vertebral and basilar artery irregularity, exaggerated due to motion.  Basilar tip remains stable.  PCA branches are stable allowing for motion.  Antegrade flow in both ICA siphons.  Cavernous ICA irregularity exacerbated by motion.  Carotid termini patent and stable.  MCA and ACA origins patent and stable.  Visualized ACA and right MCA branches are stable allowing  for motion.  High-grade stenosis by MRA at the left MCA bifurcation, but as on the comparison from earlier today, the more distal left MCA branches appear remain patent.  IMPRESSION: Degraded by motion despite repeated imaging attempts.  Intracranial arteries better evaluated on the CTA from earlier today.  Please see that report, no interval change suspected.   Original Report Authenticated By: Erskine Speed, M.D.    Mr Brain Wo Contrast  08/29/2012  *RADIOLOGY REPORT*  Clinical Data:  72 year old male with dizziness and left side weakness.  Left MCA stenosis on CTA.  History of right ACA infarct.  Comparison: Head and neck CTA earlier the same day.  Brain MRI 05/11/2011.  MRI HEAD WITHOUT CONTRAST  Technique: Multiplanar, multiecho pulse sequences of the brain and surrounding structures were obtained according to standard protocol without intravenous contrast.  Findings: Recent CT findings reflect chronic posterior right ACA territory encephalomalacia.  However, there is bilateral splenium diffusion restriction, greater on the  right associated with generalized increased T2 and FLAIR hyperintensity in the splenium and mild enlargement. The right splenium was more abnormally enlarged in 2012.  No associated hemorrhage.  No MCA territory diffusion restriction.  Diffusion elsewhere within normal limits.  Major intracranial vascular flow voids are stable since 2012.  No other areas of new signal abnormality since 2012.  Mild chronic blood products associated with the chronic right ACA encephalomalacia.  Chronic partially empty sella.  Grossly stable cervicomedullary junction visualized cervical spine.  Normal bone marrow signal.  Stable orbits.  Chronic left mastoid effusion.  Otherwise stable and grossly normal visualized internal auditory structures.  IMPRESSION: 1.  Restricted diffusion in the splenium of the corpus callosum, left greater than right.  Associated T2 and FLAIR hyperintensity. The right splenium has a more normal appearance than at the time of the 2012 infarct. 2.  Right ACA territory findings are chronic encephalomalacia. 3.  No other acute intracranial abnormality.  MRA findings below.  MRA HEAD WITHOUT CONTRAST  Technique: Angiographic images of the Circle of Willis were obtained using MRA technique without  intravenous contrast.  Findings: Study is moderately degraded by motion artifact despite repeated imaging attempts.  Antegrade flow in the posterior circulation.  Distal vertebral and basilar artery irregularity, exaggerated due to motion.  Basilar tip remains stable.  PCA branches are stable allowing for motion.  Antegrade flow in both ICA siphons.  Cavernous ICA irregularity exacerbated by motion.  Carotid termini patent and stable.  MCA and ACA origins patent and stable.  Visualized ACA and right MCA branches are stable allowing for motion.  High-grade stenosis by MRA at the left MCA bifurcation, but as on the comparison from earlier today, the more distal left MCA branches appear remain patent.  IMPRESSION: Degraded  by motion despite repeated imaging attempts.  Intracranial arteries better evaluated on the CTA from earlier today.  Please see that report, no interval change suspected.   Original Report Authenticated By: Erskine Speed, M.D.     Scheduled Meds:    . allopurinol  300 mg Oral q morning - 10a  . aspirin  325 mg Oral Daily  . colchicine  0.6 mg Oral Daily  . enoxaparin (LOVENOX) injection  40 mg Subcutaneous Daily  . LORazepam  1 mg Intravenous Once  . simvastatin  10 mg Oral q1800  . terbinafine  250 mg Oral QPM   Continuous Infusions:   Principal Problem:  *CVA (cerebral vascular accident) Active Problems:  L-S radiculopathy  Hypertension  Diabetes  mellitus type 2 in nonobese     Brendia Sacks, MD  [facility] Team 4  Pager [phone] If 7PM-7AM, please contact night-coverage at [email or web address], password [facility] 08/30/2012, 2:45 PM  LOS: 2 days

## 2012-08-30 NOTE — Progress Notes (Signed)
NCM spoke to pt and states he had [facility] in the past, but wanted new agency. Faxed referral to [facility] for [facility] PT, OT and SW for home. Pt scheduled d/c home today. Pt requesting elevated toilet seat for home. Isidoro Donning RN CCM Case Mgmt phone [phone]

## 2012-08-30 NOTE — Progress Notes (Signed)
VASCULAR LAB PRELIMINARY  PRELIMINARY  PRELIMINARY  PRELIMINARY  Carotid Dopplers completed.   Preliminary report:  There is no ICA stenosis.  Vertebral artery flow is antegrade.  Pershing Skidmore, RVT 08/30/2012, 4:01 PM

## 2012-08-30 NOTE — Discharge Summary (Signed)
Physician Discharge Summary  Luis Poole HYQ:[id number] DOB: 1941/01/05 DOA: 08/28/2012  PCP: [name], MD  Admit date: 08/28/2012 Discharge date: 08/30/2012  Recommendations for Outpatient Follow-up:  1. Followup of acute stroke, continued risk factor reduction 2. Home health physical and occupational therapy.   Follow-up Information    Follow up with Luis P, MD. In 1 month. (Call after discharge for appt in 1 to 2 months)    Contact information:   [address], [address] [facility] [place] [address] [phone]       Follow up with [name], MD. In 2 weeks.   Contact information:   [address] [place] [address] [phone]         Discharge Diagnoses:  1. Acute stroke 2. 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 Hypertension 4. Diabetes mellitus 5. History of lumbosacral radiculopathy  Discharge Condition: Improved Disposition: Home with home health physical and occupational therapy.  Diet recommendation: Heart healthy, diabetic  Filed Weights   08/28/12 1536 08/28/12 2315  Weight: 104.781 kg (231 lb) 103.6 kg (228 lb 6.3 oz)    History of present illness:  72 year old male with history of right-sided CVA with residual left-sided weakness was brought in by his daughter for feeling lightheaded and unsteady since 2 days. He also has been noticing increased weakness in his left lower leg. A head CT was obtained given his symptoms which suggested a subacute ischemia over right anterior cerebral artery distribution. Code stroke was not called as symptoms were ongoing for past 2 days.   Hospital Course:  Luis Poole was admitted for further evaluation of this possible stroke and seen by neurology in consultation. Further imaging did confirm a new stroke. Neurology recommended increasing aspirin to full-strength for secondary stroke prevention.  Continued risk factor modification as an outpatient was recommended. Patient was seen and evaluated by physical and occupational therapy and home health was recommended. Occupational therapy did note there was concerns about a family member's drug use in the home and social work outpatient consult recommended.  1. Acute stroke: Stroke evaluation complete. Aspirin 325 mg daily per neurology. EKG: Sinus rhythm no acute changes. Continue statin. 2. 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 Hypertension: Stable. Resume outpatient medications. 4. Diabetes mellitus: Stable. Diet controlled. Sliding-scale insulin.  5. History of lumbosacral radiculopathy  Consultants:  Neurology   Physical therapy: Home health   Occupational therapy: Home health, recommend social work consult  Procedures:  2-D echocardiogram: Normal LV size and systolic function, EF 60-65%. Mild LV hypertrophy. Normal RV size and systolic function. Mild biatrial enlargement  Discharge Instructions  Discharge Orders    Future Appointments: Provider: Department: Dept Phone: Center:   09/11/2012 11:20 AM Luis Oyster, MD [facility] [phone] CPR     Future Orders Please Complete By Expires   Diet - low sodium heart healthy      Diet Carb Modified      Increase activity slowly      Home Health      Questions: Responses:   To provide the following care/treatments PT    OT    Social work   Face-to-face encounter      Comments:   I Brendia Sacks certify that this patient is under my care and that I, or a nurse practitioner or physician's assistant working with me, had a  face-to-face encounter that meets the physician face-to-face encounter requirements with this patient on 08/30/2012. The encounter with the patient was in whole, or in part for the following medical condition(s) which is the primary reason for home health care (List medical  condition): Acute stroke   Questions: Responses:   The encounter with the patient was in whole, or in part, for the following medical condition, which is the primary reason for home health care Acute stroke   I certify that, based on my findings, the following services are medically necessary home health services Physical therapy   My clinical findings support the need for the above services Unsafe ambulation due to balance issues   Further, I certify that my clinical findings support that this patient is homebound due to: Unsafe ambulation due to balance issues   To provide the following care/treatments PT    OT    Social work   Discharge instructions      Comments:   You were diagnosed with a new stroke. Aspirin has been increased to full-strength 325 mg daily. Call physician or seek immediate medical attention for weakness or worsening of condition.       Medication List     As of 08/30/2012  3:00 PM    STOP taking these medications         aspirin EC 81 MG tablet      indomethacin 75 MG CR capsule   Commonly known as: INDOCIN SR      TAKE these medications         allopurinol 300 MG tablet   Commonly known as: ZYLOPRIM   Take 300 mg by mouth every morning.      amLODipine 10 MG tablet   Commonly known as: NORVASC   Take 10 mg by mouth every morning.      aspirin 325 MG tablet   Take 1 tablet (325 mg total) by mouth daily.      cloNIDine 0.3 mg/24hr   Commonly known as: CATAPRES - Dosed in mg/24 hr   Place 1 patch onto the skin once a week.      COLCRYS 0.6 MG tablet   Generic drug: colchicine   Take 0.6 mg by mouth daily as needed. gout      gabapentin 100 MG capsule   Commonly known as: NEURONTIN   Take 100 mg by mouth 3 (three) times daily as needed. Nerve pain      lisinopril 40 MG tablet   Commonly known as: PRINIVIL,ZESTRIL   Take 40 mg by mouth every morning.      methocarbamol 500 MG tablet   Commonly known as: ROBAXIN   Take 250-500 mg by mouth  every 6 (six) hours as needed. Muscle spasms      pravastatin 10 MG tablet   Commonly known as: PRAVACHOL   Take 10 mg by mouth every evening.      terbinafine 250 MG tablet   Commonly known as: LAMISIL   Take 250 mg by mouth every evening.      triamterene-hydrochlorothiazide 37.5-25 MG per tablet   Commonly known as: MAXZIDE-25   Take 1 tablet by mouth every morning.        The results of significant diagnostics from this hospitalization (including imaging, microbiology, ancillary and laboratory) are listed below for reference.    Significant Diagnostic Studies: Ct Angio Head W/cm &/or Wo Cm  08/29/2012  *RADIOLOGY REPORT*  Clinical Data:  72 year old male with dizziness and left side weakness.  Subacute right ACA infarct suspected on the basis of CT.  CT ANGIOGRAPHY HEAD AND NECK  Technique:  Multidetector CT imaging of the head and neck was performed using the standard protocol during bolus administration of intravenous contrast.  Multiplanar CT image reconstructions including MIPs were obtained to evaluate the vascular anatomy. Carotid stenosis measurements (when applicable) are obtained utilizing NASCET criteria, using the distal internal carotid diameter as the denominator.  Contrast: 50mL OMNIPAQUE IOHEXOL 350 MG/ML SOLN  Comparison:  Head CT without contrast 08/28/2012.  C t a head neck 05/15/2011.  Brain MRI 05/11/2011.  CTA NECK  Findings:  Larger lung volumes.  Negative lung apices.  No superior mediastinal lymphadenopathy.  Negative thyroid, larynx, pharynx, parapharyngeal spaces, retropharyngeal space, sublingual space, submandibular and parotid glands.  Stable orbit soft tissues. Stable paranasal sinuses and mastoids, chronic left mastoid effusion. No acute osseous abnormality identified.  Lower cervical ACDF changes again noted.  No cervical lymphadenopathy.  Vascular Findings: Three-vessel arch configuration.  Stable minor arch and great vessel origin atherosclerosis.  No great  vessel origin stenosis.  Tortuous right common carotid artery is stable.  Right carotid bifurcation is stable with circumferential mild soft and calcified plaque.  No right ICA origin stenosis.  No cervical right ICA stenosis.  Stable and normal right vertebral artery origin.  Stable chronic irregularity of the distal right vertebral artery at the C1 level just proximal to entering the dura (series 6 images 87 and 88). Stable mild stenosis here.  Stable tortuosity of the left common carotid artery.  Stable left carotid bifurcation with mild circumferential soft and calcified plaque.  No left ICA origin stenosis.  Stable tortuosity of the cervical left ICA which is otherwise normal.  Left vertebral artery origin is stable and within normal limits. Stable tortuosity of the proximal left vertebral.  Stable tortuosity of the distal left vertebral.  No left vertebral artery stenosis in the neck.   Review of the MIP images confirms the above findings.  IMPRESSION: 1.  Stable neck CTA since 2012. - Carotid atherosclerosis without stenosis. - Right vertebral artery mild irregularity and stenosis just below the skull base which might reflect chronic atherosclerosis or chronic dissection. 2.  No acute findings in the neck.  See intracranial findings below.  CTA HEAD  Findings:  Right ACA territory encephalomalacia at the vertex and extending to the right splenium.  Posterior ACA and splenium infarct was evident on the previous MRI, with minimal changes evident at the time of the comparison CTA. No acute intracranial hemorrhage identified.  No ventriculomegaly. No midline shift, mass effect, or evidence of mass lesion.  Stable gray-white matter differentiation elsewhere. No abnormal enhancement identified. Calvarium stable and intact.  Vascular Findings: Major intracranial venous structures appear to be enhancing.  Stable distal vertebral arteries, the left is mildly dominant. Stable and patent bilateral PICA vessels.  Mild  stenosis of the distal right vertebral artery just proximal to the vertebrobasilar junction is stable.  Basilar artery is stable with intermittent irregularity, but remains patent and is without focal hemodynamically significant stenosis.  SCA and PCA origins are stable and patent.  Left posterior communicating artery is within normal limits.  The right is diminutive or absent.  Bilateral PCA branch irregularity is stable and maximal in the distal left P2 segment were there is moderate stenosis.  Stable distal PCA enhancement.  Bilateral ICA siphons are stable with moderate to severe cavernous calcified atherosclerosis, greater on the right.  Moderate right cavernous ICA stenosis is stable.  No hemodynamically significant stenosis suspected on the left.  Supraclinoid ICA segments are patent and stable.  Left posterior communicating artery origin remains normal.  Carotid termini are patent and stable.  MCA and ACA origins remain within normal limits.  The left ACA A1 segment is mildly dominant as before.  Anterior communicating artery gives rise to a median artery of the corpus callosum as before.  Visualized ACA branches are stable, with no focal ACA stenosis or major branch occlusion identified compared the prior.  MCA M1 segment are stable and patent with mild irregularity greater on the left.  Right MCA branches are stable and within normal limits.  There is a new moderate to severe stenosis at the left MCA bifurcation (series 16109 images 243 - 09/1943 and series 03/26/1939 image 26).  Despite this, the left MCA branches distally are stable and within normal limits.   Review of the MIP images confirms the above findings.  IMPRESSION: 1.  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 Despite this, no major left MCA branch occlusion is identified. 2.  Otherwise stable anterior circulation.  No change in the CTA appearance of the ACA branches. 3.  Stable posterior circulation atherosclerosis and  stenosis as above. 4. Stable CT appearance of the right ACA territory since yesterday, favor this is a chronic infarct that probably was acute at that time of the 2012 CTA comparison. MRI would confirm if necessary.   Original Report Authenticated By: Erskine Speed, M.D.    Dg Chest 2 View  08/29/2012  *RADIOLOGY REPORT*  Clinical Data: Stroke.  Cough.  CHEST - 2 VIEW  Comparison: Chest x-ray 05/24/2011.  Findings: Lung volumes are slightly low.  No consolidative airspace disease.  No pleural effusions.  No pneumothorax.  No pulmonary nodule or mass noted.  Pulmonary vasculature and the cardiomediastinal silhouette are within normal limits.  Orthopedic fixation hardware in the lower cervical spine.  IMPRESSION: 1. Low lung volumes without radiographic evidence of acute cardiopulmonary disease.   Original Report Authenticated By: Trudie Reed, M.D.    Ct Head Wo Contrast  08/28/2012  *RADIOLOGY REPORT*  Clinical Data: Dizziness and lightheadedness for the past 3 days.  CT HEAD WITHOUT CONTRAST  Technique:  Contiguous axial images were obtained from the base of the skull through the vertex without contrast.  Comparison: Head CT 05/15/2011.  Findings: New area of poorly defined low attenuation in the medial aspect of the right frontal lobe, favored to represent subacute ischemia in the right anterior cerebral artery distribution.  There is loss of the normal gray-white differentiation throughout these regions.  No evidence of acute intracerebral hemorrhage, mass, mass effect, hydrocephalus or other abnormal intra or extra-axial fluid collections. Visualized paranasal sinuses and mastoids are well pneumatized, with exception of a large left mastoid effusion which appears to be chronic and is similar to prior study from 05/15/2011.  No acute displaced skull fractures are identified.  IMPRESSION: 1.  Findings, as above, concerning for subacute ischemia in the right anterior cerebral artery distribution. 2.  Large  chronic left mastoid effusion is similar to the prior examination.  These results were called by telephone on 08/28/2012 at 07:00 Poole.m. to Dr. Fonnie Jarvis, who verbally acknowledged these results.   Original Report Authenticated By: Trudie Reed, M.D.    Mr [facility] Wo Contrast  08/29/2012  *RADIOLOGY REPORT*  Clinical Data:  72 year old male with dizziness and left side weakness.  Left MCA stenosis on CTA.  History of right ACA  infarct.  Comparison: Head and neck CTA earlier the same day.  Brain MRI 05/11/2011.  MRI HEAD WITHOUT CONTRAST  Technique: Multiplanar, multiecho pulse sequences of the brain and surrounding structures were obtained according to standard protocol without intravenous contrast.  Findings: Recent CT findings reflect chronic posterior right ACA territory encephalomalacia.  However, there is bilateral splenium diffusion restriction, greater on the right associated with generalized increased T2 and FLAIR hyperintensity in the splenium and mild enlargement. The right splenium was more abnormally enlarged in 2012.  No associated hemorrhage.  No MCA territory diffusion restriction.  Diffusion elsewhere within normal limits.  Major intracranial vascular flow voids are stable since 2012.  No other areas of new signal abnormality since 2012.  Mild chronic blood products associated with the chronic right ACA encephalomalacia.  Chronic partially empty sella.  Grossly stable cervicomedullary junction visualized cervical spine.  Normal bone marrow signal.  Stable orbits.  Chronic left mastoid effusion.  Otherwise stable and grossly normal visualized internal auditory structures.  IMPRESSION: 1.  Restricted diffusion in the splenium of the corpus callosum, left greater than right.  Associated T2 and FLAIR hyperintensity. The right splenium has a more normal appearance than at the time of the 2012 infarct. 2.  Right ACA territory findings are chronic encephalomalacia. 3.  No other acute intracranial  abnormality.  MRA findings below.  MRA HEAD WITHOUT CONTRAST  Technique: Angiographic images of the Circle of Willis were obtained using MRA technique without  intravenous contrast.  Findings: Study is moderately degraded by motion artifact despite repeated imaging attempts.  Antegrade flow in the posterior circulation.  Distal vertebral and basilar artery irregularity, exaggerated due to motion.  Basilar tip remains stable.  PCA branches are stable allowing for motion.  Antegrade flow in both ICA siphons.  Cavernous ICA irregularity exacerbated by motion.  Carotid termini patent and stable.  MCA and ACA origins patent and stable.  Visualized ACA and right MCA branches are stable allowing for motion.  High-grade stenosis by MRA at the left MCA bifurcation, but as on the comparison from earlier today, the more distal left MCA branches appear remain patent.  IMPRESSION: Degraded by motion despite repeated imaging attempts.  Intracranial arteries better evaluated on the CTA from earlier today.  Please see that report, no interval change suspected.   Original Report Authenticated By: Erskine Speed, M.D.    Labs: Basic Metabolic Panel:  Lab 08/28/12 1610 08/28/12 1606  NA -- 135  K -- 3.7  CL -- 102  CO2 -- 23  GLUCOSE -- 105*  BUN -- 24*  CREATININE 1.24 1.34  CALCIUM -- 9.5  MG -- --  PHOS -- --   Liver Function Tests:  Lab 08/28/12 1900  AST 17  ALT 12  ALKPHOS 70  BILITOT 0.2*  PROT 7.9  ALBUMIN 3.8   CBC:  Lab 08/28/12 2346 08/28/12 1606  WBC 5.2 6.2  NEUTROABS -- --  HGB 11.6* 11.6*  HCT 34.9* 34.7*  MCV 85.3 86.1  PLT 191 211   CBG:  Lab 08/28/12 1559  GLUCAP 104*    Principal Problem:  *CVA (cerebral vascular accident) Active Problems:  L-S radiculopathy  Hypertension  Diabetes mellitus type 2 in nonobese   Time coordinating discharge: 25 minutes  Signed:  Brendia Sacks, MD [facility] 08/30/2012, 3:00 PM

## 2012-08-30 NOTE — Progress Notes (Signed)
Occupational Therapy Evaluation Patient Details Name: Luis Poole MRN: [id number] DOB: [birth year] Today's Date: 08/30/2012 Time: 4098-1191 OT Time Calculation (min): 26 min  OT Assessment / Plan / Recommendation Clinical Impression  71 YO S/P cva 2011. New ACA  territory CVA with increasing weakness LLe and c/o dizziness. Pt will benefit from skilled OT services in the [facility] setting. During eval, pt/pt's daughter expressed concerns over home situation regarding pt's son's use of drugs within the home. Daughter states that son smokes "illegal drugs in the house and that this makes her dad worse". Rec HHSW consult to address need for APS.    OT Assessment  All further OT needs can be met in the next venue of care    Follow Up Recommendations  Home health OT;Other (comment) (HHSW)    Barriers to Discharge  ? Home situation    Equipment Recommendations  None recommended by OT    Recommendations for Other Services  HHSW. ? APS  Frequency    eval only   Precautions / Restrictions Precautions Precautions: Fall Required Braces or Orthoses: Other Brace/Splint Other Brace/Splint: LLE dorsiflex assist (double upright metal brace attached to shoe) Restrictions Weight Bearing Restrictions: No   Pertinent Vitals/Pain No pain    ADL  Eating/Feeding: Independent Where Assessed - Eating/Feeding: Edge of bed Grooming: Modified independent Where Assessed - Grooming: Unsupported standing Upper Body Bathing: Supervision/safety;Set up Where Assessed - Upper Body Bathing: Unsupported sitting Lower Body Bathing: Supervision/safety Where Assessed - Lower Body Bathing: Supported sit to stand Upper Body Dressing: Set up;Supervision/safety Where Assessed - Upper Body Dressing: Unsupported sitting Lower Body Dressing: Minimal assistance Where Assessed - Lower Body Dressing: Supported sit to stand Toilet Transfer: Supervision/safety Statistician Method: Sit to Barista:  Comfort height toilet Toileting - Architect and Hygiene: Modified independent Where Assessed - Engineer, mining and Hygiene: Sit to stand from 3-in-1 or toilet Tub/Shower Transfer: Minimal assistance Tub/Shower Transfer Method: Other (comment) (tub bnech) Equipment Used: Gait belt;Rolling walker Transfers/Ambulation Related to ADLs: S ADL Comments: rEC 47/8 INITIAL s AFTER d/c    OT Diagnosis: Generalized weakness  OT Problem List: Decreased strength;Impaired balance (sitting and/or standing);Decreased knowledge of precautions;Impaired sensation OT Treatment Interventions:     OT Goals Acute Rehab OT Goals OT Goal Formulation:  (eval only)  Visit Information  Last OT Received On: 08/30/12 Assistance Needed: +1    Subjective Data      Prior Functioning     Home Living Lives With: Son;Daughter (2 sons and 1 daughter) Available Help at Discharge: Family;Available 24 hours/day Type of Home: House Home Access: Ramped entrance Home Layout: One level Bathroom Shower/Tub: Engineer, manufacturing systems: Standard Bathroom Accessibility: Yes Home Adaptive Equipment: Bedside commode/3-in-1;Tub transfer bench;Walker - rolling;Quad cane;Wheelchair - manual Prior Function Level of Independence: Independent with assistive device(s);Needs assistance Needs Assistance: Light Housekeeping;Meal Prep Meal Prep: Total Light Housekeeping: Total Able to Take Stairs?: Yes (with assist) Driving: No Vocation: Retired Musician: No difficulties Dominant Hand: Right         Vision/Perception Perception Perception: Within Functional Limits Praxis Praxis: Intact   Cognition  Overall Cognitive Status: Appears within functional limits for tasks assessed/performed Arousal/Alertness: Awake/alert Orientation Level: Appears intact for tasks assessed Behavior During Session: [facility] for tasks performed    Extremity/Trunk Assessment Right Upper  Extremity Assessment RUE ROM/Strength/Tone: WFL for tasks assessed RUE Sensation: WFL - Light Touch;WFL - Proprioception RUE Coordination: WFL - gross/fine motor Left Upper Extremity Assessment LUE ROM/Strength/Tone: Deficits LUE  ROM/Strength/Tone Deficits: Apparent RTC dysfunciton LUE Sensation: WFL - Light Touch;WFL - Proprioception LUE Coordination: WFL - gross/fine motor Right Lower Extremity Assessment RLE ROM/Strength/Tone: WFL for tasks assessed RLE Sensation: WFL - Light Touch Left Lower Extremity Assessment LLE ROM/Strength/Tone: Deficits LLE ROM/Strength/Tone Deficits: Strength 4-/5 at hip/knee; 2/5 DF/PF LLE Sensation: WFL - Light Touch Trunk Assessment Trunk Assessment: Normal     Mobility Bed Mobility Bed Mobility: Supine to Sit;Sit to Supine Supine to Sit: 5: Supervision Sitting - Scoot to Edge of Bed: 5: Supervision Transfers Transfers: Sit to Stand;Stand to Sit Sit to Stand: 5: Supervision;With upper extremity assist;From bed Stand to Sit: 5: Supervision;With upper extremity assist;To bed     Shoulder Instructions     Exercise     Balance Balance Balance Assessed:  (S)   End of Session OT - End of Session Equipment Utilized During Treatment: Gait belt Activity Tolerance: Patient tolerated treatment well Patient left: in bed;with call bell/phone within reach;with family/visitor present Nurse Communication: Mobility status;Other (comment) (D/C concerNS. NEED FOR cm)  GO     Luis Poole,HILLARY 08/30/2012, 2:33 PM [facility], OTR/L  [id number] 08/30/2012

## 2012-08-30 NOTE — Progress Notes (Signed)
Stroke Team Progress Note  HISTORY  Luis Poole is a 72 y.o. male who had been having symptoms since Wednesday. He had dizziness when standing and had noticed that his left leg had been "heavier" than previously. He had weakness following a stroke in 2012, but states that it seems to have gotten worse. He was with his daughter earlier today and fell against a wall and she insisted he come to the ER. There a CT showed a new right ACA territory infarct.   LKW: Wednesday  tpa given: no, out of window.   SUBJECTIVE No one at bedside.  Overall he feels his condition is back to baseline.  OBJECTIVE Most recent Vital Signs: Filed Vitals:   08/29/12 2100 08/30/12 0232 08/30/12 0640 08/30/12 0647  BP: 159/72 170/95 145/108 162/97  Pulse: 69 67 63   Temp: 97.7 F (36.5 C) 97.7 F (36.5 C) 97.6 F (36.4 C)   TempSrc: Oral Oral Oral   Resp: 20 20 20    Height:      Weight:      SpO2: 100% 100% 100%    CBG (last 3)   Basename 08/28/12 1559  GLUCAP 104*    IV Fluid Intake:     MEDICATIONS     . allopurinol  300 mg Oral q morning - 10a  . aspirin  325 mg Oral Daily  . colchicine  0.6 mg Oral Daily  . enoxaparin (LOVENOX) injection  40 mg Subcutaneous Daily  . LORazepam  1 mg Intravenous Once  . simvastatin  10 mg Oral q1800  . terbinafine  250 mg Oral QPM   PRN:  gabapentin, methocarbamol, senna-docusate  Diet:  Carb Control thin liquids Activity:  Up with assistance DVT Prophylaxis:  Lovenox  CLINICALLY SIGNIFICANT STUDIES Basic Metabolic Panel:   Lab 08/28/12 2346 08/28/12 1606  NA -- 135  K -- 3.7  CL -- 102  CO2 -- 23  GLUCOSE -- 105*  BUN -- 24*  CREATININE 1.24 1.34  CALCIUM -- 9.5  MG -- --  PHOS -- --   Liver Function Tests:   Lab 08/28/12 1900  AST 17  ALT 12  ALKPHOS 70  BILITOT 0.2*  PROT 7.9  ALBUMIN 3.8   CBC:   Lab 08/28/12 2346 08/28/12 1606  WBC 5.2 6.2  NEUTROABS -- --  HGB 11.6* 11.6*  HCT 34.9* 34.7*  MCV 85.3 86.1  PLT 191 211     Coagulation:   Lab 08/28/12 1900  LABPROT 13.1  INR 1.00   Cardiac Enzymes: No results found for this basename: CKTOTAL:3,CKMB:3,CKMBINDEX:3,TROPONINI:3 in the last 168 hours Urinalysis:   Lab 08/28/12 1904  COLORURINE YELLOW  LABSPEC 1.017  PHURINE 5.5  GLUCOSEU NEGATIVE  HGBUR NEGATIVE  BILIRUBINUR NEGATIVE  KETONESUR NEGATIVE  PROTEINUR NEGATIVE  UROBILINOGEN 0.2  NITRITE NEGATIVE  LEUKOCYTESUR NEGATIVE   Lipid Panel    Component Value Date/Time   CHOL 186 08/29/2012 0555   TRIG 161* 08/29/2012 0555   HDL 38* 08/29/2012 0555   CHOLHDL 4.9 08/29/2012 0555   VLDL 32 08/29/2012 0555   LDLCALC 116* 08/29/2012 0555   HgbA1C  Lab Results  Component Value Date   HGBA1C 6.4* 08/29/2012    Urine Drug Screen:     Component Value Date/Time   LABOPIA NONE DETECTED 08/28/2012 1904   COCAINSCRNUR NONE DETECTED 08/28/2012 1904   LABBENZ NONE DETECTED 08/28/2012 1904   AMPHETMU NONE DETECTED 08/28/2012 1904   THCU NONE DETECTED 08/28/2012 1904   LABBARB NONE  DETECTED 08/28/2012 1904    Alcohol Level:   Lab 08/28/12 1900  ETH <11    Ct Head Wo Contrast  08/28/2012    IMPRESSION: 1.  Findings, as above, concerning for subacute ischemia in the right anterior cerebral artery distribution. 2.  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     MRI of the brain    1. Restricted diffusion in the splenium of the corpus callosum,  left greater than right. Associated T2 and FLAIR hyperintensity.  The right splenium has a more normal appearance than at the time of  the 2012 infarct.  2. Right ACA territory findings are chronic encephalomalacia.  3. No other acute intracranial abnormality.   MRA  IMPRESSION:  Degraded by motion despite repeated imaging attempts. Intracranial  arteries better evaluated on the CTA from earlier today. Please  see that report, no interval change suspected.  CTA of Head  IMPRESSION:  1. 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  Despite this, no major left MCA branch occlusion is identified.  2. Otherwise stable anterior circulation. No change in the CTA  appearance of the ACA branches.  3. Stable posterior circulation atherosclerosis and stenosis as  above.  4. Stable CT appearance of the right ACA territory since yesterday,  favor this is a chronic infarct that probably was acute at that  time of the 2012 CTA comparison. MRI would confirm if necessary.  CTA of Neck  IMPRESSION:  1. Stable neck CTA since 2012.  - Carotid atherosclerosis without stenosis.  - Right vertebral artery mild irregularity and stenosis just below  the skull base which might reflect chronic atherosclerosis or  chronic dissection.  2. No acute findings in the neck. See intracranial findings  below.  2D Echocardiogram  - EF 60-65% No obvious source of emboli noted.  EKG  SR rate 68 BPM  Therapy Recommendations - Home health PT with 24 hour supervision  Physical Exam  Mental Status:  Patient is awake, alert, oriented to person, place, month, year, and situation.  Immediate and remote memory are intact.  Patient is able to give a clear and coherent history.  No signs of aphasia.  Cranial Nerves:  II: Visual Fields are full. Pupils are equal, round, and reactive to light. Discs are difficult to visualize.  III,IV, VI: EOMI without ptosis or diploplia.  V: Facial sensation is symmetric to temperature  VII: Facial movement is symmetric.  VIII: hearing is intact to voice  X: Uvula elevates symmetrically  XI: Shoulder shrug is symmetric.  XII: tongue is midline without atrophy or fasciculations.  Motor:  Tone is normal. Bulk is normal. 5/5 strength was present on right, 5/5 LUE, 4/5 proximally in left leg, 2/5 distally which is chronic due to foot drop.     ASSESSMENT Luis Poole is a 72 y.o. male presenting with dizziness and left sided weakness. . No IV t-PA due to late presentation.   Imaging confirms subacute ischemia in the right anterior cerebral artery distribution. CT angio pending. Work up underway. On aspirin 81 mg orally every day prior to admission. Now on aspirin 325 mg orally every day for secondary stroke prevention. Patient with resultant left hemiparesis.   Previous CVA.  Left foot drop.  Htn  DM  Previous tobacco use.  Dyslipidemia - on statin  Hospital day # 2  TREATMENT/PLAN  Continue aspirin 325 mg orally every day for secondary stroke prevention.  CT angio  of head and neck. - done - results as above  Risk factor modification. BP occasionally elevated  Probable discharge today.  HH PT 24hr per day supervision.  F/U Dr Lenna Sciara PA-C [facility] Pager [phone] 08/30/2012, 9:31 AM I have personally obtained a history, examined the patient, evaluated imaging results, and formulated the assessment and plan of care. I agree with the above.  As per pt he is back to his baseline today.  Titer glycemic control  HTN management.  MRI old R ACA infarct, new splenium corpus callosum infarct.  F/up as out pt.

## 2012-09-11 ENCOUNTER — Encounter: Payer: Self-pay | Admitting: Physical Medicine & Rehabilitation

## 2012-09-11 ENCOUNTER — Encounter: Payer: Medicare Other | Attending: Physical Medicine & Rehabilitation | Admitting: Physical Medicine & Rehabilitation

## 2012-09-11 VITALS — BP 181/103 | HR 73 | Resp 14 | Ht 68.0 in | Wt 238.0 lb

## 2012-09-11 DIAGNOSIS — Z862 Personal history of diseases of the blood and blood-forming organs and certain disorders involving the immune mechanism: Secondary | ICD-10-CM | POA: Insufficient documentation

## 2012-09-11 DIAGNOSIS — M549 Dorsalgia, unspecified: Secondary | ICD-10-CM | POA: Insufficient documentation

## 2012-09-11 DIAGNOSIS — E119 Type 2 diabetes mellitus without complications: Secondary | ICD-10-CM

## 2012-09-11 DIAGNOSIS — I69959 Hemiplegia and hemiparesis following unspecified cerebrovascular disease affecting unspecified side: Secondary | ICD-10-CM | POA: Insufficient documentation

## 2012-09-11 DIAGNOSIS — E1142 Type 2 diabetes mellitus with diabetic polyneuropathy: Secondary | ICD-10-CM

## 2012-09-11 DIAGNOSIS — E1149 Type 2 diabetes mellitus with other diabetic neurological complication: Secondary | ICD-10-CM

## 2012-09-11 DIAGNOSIS — Z8639 Personal history of other endocrine, nutritional and metabolic disease: Secondary | ICD-10-CM | POA: Insufficient documentation

## 2012-09-11 DIAGNOSIS — Z981 Arthrodesis status: Secondary | ICD-10-CM | POA: Insufficient documentation

## 2012-09-11 DIAGNOSIS — IMO0002 Reserved for concepts with insufficient information to code with codable children: Secondary | ICD-10-CM

## 2012-09-11 DIAGNOSIS — I639 Cerebral infarction, unspecified: Secondary | ICD-10-CM

## 2012-09-11 DIAGNOSIS — M5417 Radiculopathy, lumbosacral region: Secondary | ICD-10-CM

## 2012-09-11 DIAGNOSIS — R269 Unspecified abnormalities of gait and mobility: Secondary | ICD-10-CM | POA: Insufficient documentation

## 2012-09-11 DIAGNOSIS — I6350 Cerebral infarction due to unspecified occlusion or stenosis of unspecified cerebral artery: Secondary | ICD-10-CM

## 2012-09-11 MED ORDER — HYDROCODONE-ACETAMINOPHEN 5-325 MG PO TABS: 1.0000 | ORAL_TABLET | Freq: Four times a day (QID) | ORAL | Status: DC | PRN

## 2012-09-11 NOTE — Progress Notes (Signed)
Subjective:    Patient ID: Luis Poole, male    DOB: 09-Mar-1941, 72 y.o.   MRN: [id number]  HPI  Mr. Crago is back regarding his gait disorder and chronic pain. He was admitted two weeks ago for dizziness and left sided weakness. He was admitted for potential stroke. Mri was notable for findings in the corpus callosum, but findings were not definitive for a stroke.  His back pain has been worse since being in the hospital. He attributes it to decreased aactivity. He uses a hydrocodone for breakthrough pain and an occasional robaxin.  He has been less routine with his back exercises over the last month or so.  He is wearing his afo more often. The only time he won't is when he is getting up to the bathroom in the middle of the night.    Pain Inventory Average Pain 10 Pain Right Now 9 My pain is intermittent and sharp  In the last 24 hours, has pain interfered with the following? General activity 8 Relation with others 8 Enjoyment of life 8 What TIME of day is your pain at its worst? morning Sleep (in general) Poor  Pain is worse with: bending Pain improves with: medication Relief from Meds: 9  Mobility use a walker ability to climb steps?  no do you drive?  no  Function retired  Neuro/Psych weakness numbness trouble walking  Prior Studies Any changes since last visit?  no  Physicians involved in your care Any changes since last visit?  no   Family History  Problem Relation Age of Onset  . Stroke Mother    History   Social History  . Marital Status: Single    Spouse Name: N/A    Number of Children: N/A  . Years of Education: N/A   Social History Main Topics  . Smoking status: Former Games developer  . Smokeless tobacco: Former Neurosurgeon  . Alcohol Use: No  . Drug Use: No  . Sexually Active: None   Other Topics Concern  . None   Social History Narrative  . None   Past Surgical History  Procedure Date  . Cataract extraction   . Knee surgery   . Spine  surgery   . Eye surgery   . Neck surgery   . Tonsilectomy, adenoidectomy, bilateral myringotomy and tubes    Past Medical History  Diagnosis Date  . S/P lumbar fusion   . Hypertension   . Diabetes mellitus   . Cataract   . Stroke    BP 181/103  Pulse 73  Resp 14  Ht 5\' 8"  (1.727 m)  Wt 238 lb (107.956 kg)  BMI 36.19 kg/m2  SpO2 100%    Review of Systems  Musculoskeletal: Positive for back pain and gait problem.  Neurological: Positive for weakness and numbness.  All other systems reviewed and are negative.       Objective:   Physical Exam Constitutional: He is oriented to person, place, and time. He appears well-developed and well-nourished.  HENT:  Head: Normocephalic and atraumatic.  Right Ear: External ear normal.  Left Ear: External ear normal.  Nose: Nose normal.  Mouth/Throat: Oropharynx is clear and moist.  Eyes: Conjunctivae are normal. Pupils are equal, round, and reactive to light.  Cardiovascular: Normal rate and regular rhythm.  Pulmonary/Chest: Effort normal and breath sounds normal. No respiratory distress. He has no wheezes.  Neurological: He is alert and oriented to person, place, and time.  Trace ank and eversion in the left leg.  He has 1-2/5 plantar flexion. Knee extension is 3+ to 4/5. Knee flexion is 4/5. Hip abduction and abduction are 4/5. He tended to walk with recurvatum at the left knee.he is using his AFO today. Minimal resting tone is seen. Reflexes 2++ on the left.  Stocking glove sensory loss in both feet specifically over the midfoot and into the toes.  Back is notable for tenderness to palpation at the L5-S1 levels predominantly with pain into the flanks bilateraly . Pain seems to be worse with flexion than extension today..  Assessment & Plan:   1. 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 Pt with persistent back pain.  2. Hx of right brain stroke with left hemiparesis. (new recent event?) 3. Diabetes with  peripheral neuropathy  4. Hx of gout   Plan:  1.Will fill hydrocodone 5/325 for breakthrough pain q6hr prn #60 (he had previously gotten this through ortho)  2. Diabetes control  3. Continue robaxin for muscle spasm prn 4. Reviewed posture and back exercises today. I would prefer that he walk with his AFO at all times.  5. I'll see him back in about 4 months. 15 minutes of face to face patient care time were spent during this visit. All questions were encouraged and answered.

## 2012-09-11 NOTE — Patient Instructions (Signed)
CONTINUE WORKING ON YOUR BACK RANGE OF MOTION AND POSTURE!!!  PRACTICE SAFETY WITH YOUR WALKING AND MOBILITY!

## 2012-09-17 ENCOUNTER — Ambulatory Visit: Payer: Medicare Other

## 2012-10-04 ENCOUNTER — Emergency Department (HOSPITAL_COMMUNITY)
Admission: EM | Admit: 2012-10-04 | Discharge: 2012-10-04 | Disposition: A | Discharge status: Home / Self Care | Payer: Medicare Other | Attending: Emergency Medicine | Admitting: Emergency Medicine

## 2012-10-04 ENCOUNTER — Emergency Department (HOSPITAL_COMMUNITY): Payer: Medicare Other

## 2012-10-04 ENCOUNTER — Encounter (HOSPITAL_COMMUNITY): Payer: Self-pay | Admitting: Nurse Practitioner

## 2012-10-04 DIAGNOSIS — R05 Cough: Secondary | ICD-10-CM

## 2012-10-04 DIAGNOSIS — Z79899 Other long term (current) drug therapy: Secondary | ICD-10-CM | POA: Insufficient documentation

## 2012-10-04 DIAGNOSIS — Z9849 Cataract extraction status, unspecified eye: Secondary | ICD-10-CM | POA: Insufficient documentation

## 2012-10-04 DIAGNOSIS — M549 Dorsalgia, unspecified: Secondary | ICD-10-CM | POA: Insufficient documentation

## 2012-10-04 DIAGNOSIS — Z8673 Personal history of transient ischemic attack (TIA), and cerebral infarction without residual deficits: Secondary | ICD-10-CM | POA: Insufficient documentation

## 2012-10-04 DIAGNOSIS — R059 Cough, unspecified: Secondary | ICD-10-CM | POA: Insufficient documentation

## 2012-10-04 DIAGNOSIS — G8929 Other chronic pain: Secondary | ICD-10-CM | POA: Insufficient documentation

## 2012-10-04 DIAGNOSIS — Z981 Arthrodesis status: Secondary | ICD-10-CM | POA: Insufficient documentation

## 2012-10-04 DIAGNOSIS — Z87891 Personal history of nicotine dependence: Secondary | ICD-10-CM | POA: Insufficient documentation

## 2012-10-04 DIAGNOSIS — E119 Type 2 diabetes mellitus without complications: Secondary | ICD-10-CM | POA: Insufficient documentation

## 2012-10-04 DIAGNOSIS — I10 Essential (primary) hypertension: Secondary | ICD-10-CM | POA: Insufficient documentation

## 2012-10-04 DIAGNOSIS — Z7982 Long term (current) use of aspirin: Secondary | ICD-10-CM | POA: Insufficient documentation

## 2012-10-04 LAB — BASIC METABOLIC PANEL
CO2: 25 mEq/L (ref 19–32)
Calcium: 9.3 mg/dL (ref 8.4–10.5)
Creatinine, Ser: 1.24 mg/dL (ref 0.50–1.35)
Glucose, Bld: 104 mg/dL — ABNORMAL HIGH (ref 70–99)

## 2012-10-04 LAB — CBC
MCH: 29.2 pg (ref 26.0–34.0)
MCHC: 34.1 g/dL (ref 30.0–36.0)
MCV: 85.7 fL (ref 78.0–100.0)
Platelets: 217 10*3/uL (ref 150–400)
RBC: 4.07 MIL/uL — ABNORMAL LOW (ref 4.22–5.81)

## 2012-10-04 MED ORDER — HYDROCODONE-ACETAMINOPHEN 5-325 MG PO TABS: 1.0000 | ORAL_TABLET | Freq: Four times a day (QID) | ORAL | Status: DC | PRN

## 2012-10-04 MED ORDER — BENZONATATE 100 MG PO CAPS: 100.0000 mg | ORAL_CAPSULE | Freq: Three times a day (TID) | ORAL | Status: DC

## 2012-10-04 NOTE — ED Notes (Signed)
Called to room x2, no response

## 2012-10-04 NOTE — ED Provider Notes (Signed)
History     CSN: [id number]  Arrival date & time 10/04/12  1607   First MD Initiated Contact with Patient 10/04/12 1711      Chief complaint: cough   (Consider location/radiation/quality/duration/timing/severity/associated sxs/prior treatment) Patient is a 72 y.o. male presenting with cough. The history is provided by the patient.  Cough Associated symptoms: no chest pain, no chills, no fever, no headaches, no rash and no shortness of breath   pt c/o non productive cough for the past 3 days. Episodic. States has had a few hard coughing spells. w coughing, states it is making his back pain hurt more. C/o left mid to lower back pain. Non radiating. Constant, dull, mod-severe, worse w coughing, bending. States has long hx chronic back pain, states he feels all the coughing is aggravating his back pain. Denies sore throat, runny nose, or other uri c/o. No fever or chills. No anterior/chest pain. No sob, nv or diaphoresis. No pleuritic pain. No leg pain or swelling.      Past Medical History  Diagnosis Date  . S/P lumbar fusion   . Hypertension   . Diabetes mellitus   . Cataract   . Stroke     Past Surgical History  Procedure Laterality Date  . Cataract extraction    . Knee surgery    . Spine surgery    . Eye surgery    . Neck surgery    . Tonsilectomy, adenoidectomy, bilateral myringotomy and tubes      Family History  Problem Relation Age of Onset  . Stroke Mother     History  Substance Use Topics  . Smoking status: Former Games developer  . Smokeless tobacco: Former Neurosurgeon  . Alcohol Use: No      Review of Systems  Constitutional: Negative for fever and chills.  HENT: Negative for neck pain.   Eyes: Negative for redness.  Respiratory: Positive for cough. Negative for shortness of breath.   Cardiovascular: Negative for chest pain and leg swelling.  Gastrointestinal: Negative for abdominal pain.  Genitourinary: Negative for flank pain.  Musculoskeletal: Positive for  back pain.  Skin: Negative for rash.  Neurological: Negative for headaches.  Hematological: Does not bruise/bleed easily.  Psychiatric/Behavioral: Negative for confusion.    Allergies  Review of patient's allergies indicates no known allergies.  Home Medications   Current Outpatient Rx  Name  Route  Sig  Dispense  Refill  . allopurinol (ZYLOPRIM) 300 MG tablet   Oral   Take 300 mg by mouth every morning.         Marland Kitchen amLODipine (NORVASC) 10 MG tablet   Oral   Take 10 mg by mouth every morning.          Marland Kitchen aspirin 81 MG tablet   Oral   Take 81 mg by mouth daily.         . cloNIDine (CATAPRES - DOSED IN MG/24 HR) 0.3 mg/24hr   Transdermal   Place 1 patch onto the skin once a week.          . COLCRYS 0.6 MG tablet   Oral   Take 0.6 mg by mouth daily as needed. gout         . gabapentin (NEURONTIN) 100 MG capsule   Oral   Take 100 mg by mouth 3 (three) times daily as needed. Nerve pain         . HYDROcodone-acetaminophen (NORCO/VICODIN) 5-325 MG per tablet   Oral   Take 1 tablet by mouth  every 6 (six) hours as needed for pain.   60 tablet   1   . indomethacin (INDOCIN SR) 75 MG CR capsule   Oral   Take 75 mg by mouth 2 (two) times daily with a meal.         . lisinopril (PRINIVIL,ZESTRIL) 40 MG tablet   Oral   Take 40 mg by mouth every morning.          . methocarbamol (ROBAXIN) 500 MG tablet   Oral   Take 250-500 mg by mouth every 6 (six) hours as needed. Muscle spasms         . pravastatin (PRAVACHOL) 10 MG tablet   Oral   Take 10 mg by mouth every evening.          . terbinafine (LAMISIL) 250 MG tablet   Oral   Take 250 mg by mouth every evening.         . triamterene-hydrochlorothiazide (MAXZIDE-25) 37.5-25 MG per tablet   Oral   Take 1 tablet by mouth every morning.            BP 158/77  Pulse 80  Temp(Src) 98.2 F (36.8 C) (Oral)  Resp 17  SpO2 98%  Physical Exam  Nursing note and vitals reviewed. Constitutional: He  is oriented to person, place, and time. He appears well-developed and well-nourished. No distress.  HENT:  Head: Atraumatic.  Mouth/Throat: Oropharynx is clear and moist.  Eyes: Conjunctivae are normal. No scleral icterus.  Neck: Neck supple. No tracheal deviation present.  Cardiovascular: Normal rate, regular rhythm, normal heart sounds and intact distal pulses.  Exam reveals no gallop and no friction rub.   No murmur heard. Pulmonary/Chest: Effort normal and breath sounds normal. No accessory muscle usage. No respiratory distress.  Abdominal: Soft. Bowel sounds are normal. He exhibits no distension and no mass. There is no tenderness. There is no rebound and no guarding.  Musculoskeletal: Normal range of motion. He exhibits no edema.  Left mid to lower back muscular tenderness. No crepitus. CTLS spine, non tender, aligned, no step off.   Neurological: He is alert and oriented to person, place, and time.  Skin: Skin is warm and dry.  Psychiatric: He has a normal mood and affect.    ED Course  Procedures (including critical care time)  Results for orders placed during the hospital encounter of 10/04/12  CBC      Result Value Range   WBC 6.5  4.0 - 10.5 K/uL   RBC 4.07 (*) 4.22 - 5.81 MIL/uL   Hemoglobin 11.9 (*) 13.0 - 17.0 g/dL   HCT 40.9 (*) 81.1 - 91.4 %   MCV 85.7  78.0 - 100.0 fL   MCH 29.2  26.0 - 34.0 pg   MCHC 34.1  30.0 - 36.0 g/dL   RDW 78.2  95.6 - 21.3 %   Platelets 217  150 - 400 K/uL  BASIC METABOLIC PANEL      Result Value Range   Sodium 139  135 - 145 mEq/L   Potassium 3.8  3.5 - 5.1 mEq/L   Chloride 102  96 - 112 mEq/L   CO2 25  19 - 32 mEq/L   Glucose, Bld 104 (*) 70 - 99 mg/dL   BUN 19  6 - 23 mg/dL   Creatinine, Ser 0.86  0.50 - 1.35 mg/dL   Calcium 9.3  8.4 - 57.8 mg/dL   GFR calc non Af Amer 58 (*) >90 mL/min   GFR  calc Af Amer 67 (*) >90 mL/min   Dg Chest 2 View  10/04/2012  *RADIOLOGY REPORT*  Clinical Data: Chest pain.  CHEST - 2 VIEW   Comparison: 08/29/2012.  Findings: Low volume chest.  Cervical fixation hardware is unchanged.  No airspace disease.  No effusion.  Basilar atelectasis.  Cardiopericardial silhouette and mediastinal contours appear within normal limits allowing for volumes of inspiration.  IMPRESSION:  Low volume chest.  No gross acute cardiopulmonary disease.   Original Report Authenticated By: Andreas Newport, M.D.        MDM  Cxr.  Labs sent from triage.  Reviewed nursing notes and prior charts for additional history.   Percocet po.   Date: 10/04/2012  Rate: 75  Rhythm: normal sinus rhythm  QRS Axis: normal  Intervals: normal  ST/T Wave abnormalities: nonspecific T wave changes  Conduction Disutrbances:none  Narrative Interpretation:   Old EKG Reviewed: unchanged   Recheck pt comfortable.  Occasional non productive cough, but pt breathing comfortably, no increased wob. Pulse ox 98% room air. No increased wob.   Discussed need close pcp f/u. Pt appears stable for d/c.        Suzi Roots, MD 10/04/12 [phone]

## 2012-10-04 NOTE — ED Notes (Signed)
Pt states he started coughing Thursday and has been having back pain since and then today started to have chest pain every times he coughs or breathes. A&Ox4, resp e/u

## 2012-10-04 NOTE — ED Notes (Signed)
Patient states he is having left flank pain when he coughs. Denies any nausea, vomiting or diarrhea. Denies pain when he takes in a deep breath.

## 2012-11-27 ENCOUNTER — Other Ambulatory Visit (HOSPITAL_COMMUNITY): Payer: Self-pay | Admitting: Specialist

## 2012-11-27 DIAGNOSIS — M25462 Effusion, left knee: Secondary | ICD-10-CM

## 2012-11-30 ENCOUNTER — Encounter (HOSPITAL_COMMUNITY)
Admission: RE | Admit: 2012-11-30 | Discharge: 2012-11-30 | Disposition: A | Discharge status: Home / Self Care | Payer: Medicare Other | Source: Ambulatory Visit | Attending: Specialist | Admitting: Specialist

## 2012-11-30 DIAGNOSIS — Z96659 Presence of unspecified artificial knee joint: Secondary | ICD-10-CM | POA: Insufficient documentation

## 2012-11-30 DIAGNOSIS — M25569 Pain in unspecified knee: Secondary | ICD-10-CM | POA: Insufficient documentation

## 2012-11-30 DIAGNOSIS — M25462 Effusion, left knee: Secondary | ICD-10-CM

## 2012-11-30 DIAGNOSIS — M25469 Effusion, unspecified knee: Secondary | ICD-10-CM | POA: Insufficient documentation

## 2012-11-30 MED ORDER — TECHNETIUM TC 99M MEDRONATE IV KIT
25.0000 | PACK | Freq: Once | INTRAVENOUS | Status: AC | PRN
  Administered 2012-11-30: 25 via INTRAVENOUS

## 2012-12-18 ENCOUNTER — Ambulatory Visit: Payer: Self-pay | Admitting: Nurse Practitioner

## 2012-12-21 ENCOUNTER — Ambulatory Visit: Payer: Medicare Other | Attending: Specialist | Admitting: Physical Therapy

## 2012-12-21 DIAGNOSIS — IMO0001 Reserved for inherently not codable concepts without codable children: Secondary | ICD-10-CM | POA: Insufficient documentation

## 2012-12-21 DIAGNOSIS — M2560 Stiffness of unspecified joint, not elsewhere classified: Secondary | ICD-10-CM | POA: Insufficient documentation

## 2012-12-21 DIAGNOSIS — M545 Low back pain, unspecified: Secondary | ICD-10-CM | POA: Insufficient documentation

## 2012-12-21 DIAGNOSIS — R262 Difficulty in walking, not elsewhere classified: Secondary | ICD-10-CM | POA: Insufficient documentation

## 2012-12-21 DIAGNOSIS — R5381 Other malaise: Secondary | ICD-10-CM | POA: Insufficient documentation

## 2012-12-21 DIAGNOSIS — R269 Unspecified abnormalities of gait and mobility: Secondary | ICD-10-CM | POA: Insufficient documentation

## 2012-12-22 ENCOUNTER — Ambulatory Visit: Payer: Medicare Other | Admitting: Physical Therapy

## 2012-12-29 ENCOUNTER — Ambulatory Visit: Payer: Medicare Other | Admitting: Rehabilitation

## 2012-12-31 ENCOUNTER — Ambulatory Visit: Payer: Medicare Other

## 2013-01-05 ENCOUNTER — Encounter: Payer: Medicare Other | Admitting: Rehabilitation

## 2013-01-05 ENCOUNTER — Encounter: Payer: Medicare Other | Admitting: Physical Medicine & Rehabilitation

## 2013-01-05 ENCOUNTER — Ambulatory Visit: Payer: Medicare Other | Admitting: Physical Therapy

## 2013-01-07 ENCOUNTER — Ambulatory Visit: Payer: Medicare Other | Admitting: Rehabilitation

## 2013-01-14 ENCOUNTER — Ambulatory Visit: Payer: Medicare Other | Admitting: Rehabilitation

## 2013-01-19 ENCOUNTER — Encounter: Payer: Medicare Other | Admitting: Physical Medicine & Rehabilitation

## 2013-01-20 ENCOUNTER — Ambulatory Visit: Payer: Medicare Other | Attending: Specialist | Admitting: Physical Therapy

## 2013-01-20 DIAGNOSIS — R5381 Other malaise: Secondary | ICD-10-CM | POA: Insufficient documentation

## 2013-01-20 DIAGNOSIS — R262 Difficulty in walking, not elsewhere classified: Secondary | ICD-10-CM | POA: Insufficient documentation

## 2013-01-20 DIAGNOSIS — M545 Low back pain, unspecified: Secondary | ICD-10-CM | POA: Insufficient documentation

## 2013-01-20 DIAGNOSIS — M2560 Stiffness of unspecified joint, not elsewhere classified: Secondary | ICD-10-CM | POA: Insufficient documentation

## 2013-01-20 DIAGNOSIS — IMO0001 Reserved for inherently not codable concepts without codable children: Secondary | ICD-10-CM | POA: Insufficient documentation

## 2013-01-20 DIAGNOSIS — R269 Unspecified abnormalities of gait and mobility: Secondary | ICD-10-CM | POA: Insufficient documentation

## 2013-01-21 ENCOUNTER — Ambulatory Visit: Payer: Medicare Other | Admitting: Rehabilitation

## 2013-01-28 ENCOUNTER — Other Ambulatory Visit: Payer: Self-pay | Admitting: Specialist

## 2013-01-28 ENCOUNTER — Ambulatory Visit: Payer: Medicare Other

## 2013-01-28 DIAGNOSIS — M545 Low back pain, unspecified: Secondary | ICD-10-CM

## 2013-02-03 ENCOUNTER — Other Ambulatory Visit: Payer: Medicare Other

## 2013-02-03 ENCOUNTER — Ambulatory Visit
Admission: RE | Admit: 2013-02-03 | Discharge: 2013-02-03 | Disposition: A | Discharge status: Home / Self Care | Payer: Medicare Other | Source: Ambulatory Visit | Attending: Specialist | Admitting: Specialist

## 2013-02-03 DIAGNOSIS — M545 Low back pain, unspecified: Secondary | ICD-10-CM

## 2013-02-03 MED ORDER — GADOBENATE DIMEGLUMINE 529 MG/ML IV SOLN
20.0000 mL | Freq: Once | INTRAVENOUS | Status: AC | PRN
  Administered 2013-02-03: 20 mL via INTRAVENOUS

## 2013-02-08 ENCOUNTER — Encounter: Payer: Self-pay | Admitting: Physical Medicine & Rehabilitation

## 2013-02-08 ENCOUNTER — Encounter: Payer: Medicare Other | Attending: Physical Medicine & Rehabilitation | Admitting: Physical Medicine & Rehabilitation

## 2013-02-08 VITALS — BP 156/67 | HR 69 | Resp 16 | Ht 67.0 in | Wt 231.0 lb

## 2013-02-08 DIAGNOSIS — E1142 Type 2 diabetes mellitus with diabetic polyneuropathy: Secondary | ICD-10-CM

## 2013-02-08 DIAGNOSIS — Z5181 Encounter for therapeutic drug level monitoring: Secondary | ICD-10-CM

## 2013-02-08 DIAGNOSIS — I69959 Hemiplegia and hemiparesis following unspecified cerebrovascular disease affecting unspecified side: Secondary | ICD-10-CM | POA: Insufficient documentation

## 2013-02-08 DIAGNOSIS — I639 Cerebral infarction, unspecified: Secondary | ICD-10-CM

## 2013-02-08 DIAGNOSIS — R269 Unspecified abnormalities of gait and mobility: Secondary | ICD-10-CM | POA: Insufficient documentation

## 2013-02-08 DIAGNOSIS — IMO0002 Reserved for concepts with insufficient information to code with codable children: Secondary | ICD-10-CM

## 2013-02-08 DIAGNOSIS — I6350 Cerebral infarction due to unspecified occlusion or stenosis of unspecified cerebral artery: Secondary | ICD-10-CM

## 2013-02-08 DIAGNOSIS — M109 Gout, unspecified: Secondary | ICD-10-CM | POA: Insufficient documentation

## 2013-02-08 DIAGNOSIS — M961 Postlaminectomy syndrome, not elsewhere classified: Secondary | ICD-10-CM

## 2013-02-08 DIAGNOSIS — M5417 Radiculopathy, lumbosacral region: Secondary | ICD-10-CM

## 2013-02-08 DIAGNOSIS — E1149 Type 2 diabetes mellitus with other diabetic neurological complication: Secondary | ICD-10-CM

## 2013-02-08 DIAGNOSIS — Z981 Arthrodesis status: Secondary | ICD-10-CM | POA: Insufficient documentation

## 2013-02-08 DIAGNOSIS — G8929 Other chronic pain: Secondary | ICD-10-CM | POA: Insufficient documentation

## 2013-02-08 DIAGNOSIS — M545 Low back pain, unspecified: Secondary | ICD-10-CM | POA: Insufficient documentation

## 2013-02-08 DIAGNOSIS — E119 Type 2 diabetes mellitus without complications: Secondary | ICD-10-CM

## 2013-02-08 DIAGNOSIS — Z79899 Other long term (current) drug therapy: Secondary | ICD-10-CM

## 2013-02-08 DIAGNOSIS — I10 Essential (primary) hypertension: Secondary | ICD-10-CM | POA: Insufficient documentation

## 2013-02-08 MED ORDER — OXCARBAZEPINE 150 MG PO TABS: 150.0000 mg | ORAL_TABLET | Freq: Two times a day (BID) | ORAL | Status: DC

## 2013-02-08 MED ORDER — HYDROCODONE-ACETAMINOPHEN 5-325 MG PO TABS: 1.0000 | ORAL_TABLET | Freq: Four times a day (QID) | ORAL | Status: DC | PRN

## 2013-02-08 MED ORDER — METHOCARBAMOL 500 MG PO TABS: 250.0000 mg | ORAL_TABLET | Freq: Four times a day (QID) | ORAL | Status: DC | PRN

## 2013-02-08 NOTE — Addendum Note (Signed)
Addended by: Judd Gaudier on: 02/08/2013 02:19 PM   Modules accepted: Orders

## 2013-02-08 NOTE — Patient Instructions (Signed)
1. TAKE TRILEPTAL 150MG  AT NIGHT TIME FOR ONE WEEK THEN INCREASE TO TWICE DAILY THEREAFTER  2. WORK ON GOOD POSTURE AND REGULAR EXERCISE FOR BACK. USE GOOD TECHNIQUE WHEN YOU STAND OR SIT!!!

## 2013-02-08 NOTE — Progress Notes (Signed)
Subjective:    Patient ID: Luis Poole, male    DOB: 1941-08-13, 72 y.o.   MRN: [id number]  HPI  Luis Poole is back regarding his chronic low back pain. He just completed a course of PT per Dr. Barbaraann Faster orders. PT dicharged him apparently as they had nothing else new to offer him per pt. Dr. Otelia Sergeant saw him recently and ordered a follow up MRI.  The results were as follows:  L2-3: Stable 3 mm retrolisthesis with a new small soft disc  protrusion into the left lateral recess with slight compression of  the thecal sac without focal neural impingement.  L3-4: Tiny broad-based disc bulge with no neural impingement,  unchanged. Right far lateral bulge touches the L3 nerve. Moderate  bilateral facet arthritis, unchanged.  L4-5: Minimal broad-based disc bulge with no neural impingement.  Mild degenerative changes of the facet joints, stable.  L5-S1: Interval posterior fusion. Stable spondylolisthesis. No  residual disc material. No neural impingement. Slight enhancing  scarring around the posterior aspect of the thecal sac to the  expected degree.  He is having a lot of back pain when he stands with radiation of the pain with associated and numbness from his left low back over his buttock to the lateral left leg and foot.   He is also having tingling in his toes of both feet, although we have discussed this before. He tells me that his sugars are under good control at present.    Pain Inventory Average Pain 0 Pain Right Now 0 My pain is intermittent  In the last 24 hours, has pain interfered with the following? General activity 0 Relation with others 0 Enjoyment of life 0 What TIME of day is your pain at its worst? evening Sleep (in general) Fair  Pain is worse with: sitting Pain improves with: pacing activities Relief from Meds: 6  Mobility walk without assistance use a walker ability to climb steps?  yes do you drive?  yes Do you have any goals in this area?   yes  Function retired  Neuro/Psych trouble walking  Prior Studies Any changes since last visit?  no  Physicians involved in your care Any changes since last visit?  no   Family History  Problem Relation Age of Onset  . Stroke Mother    History   Social History  . Marital Status: Single    Spouse Name: N/A    Number of Children: N/A  . Years of Education: N/A   Social History Main Topics  . Smoking status: Former Games developer  . Smokeless tobacco: Former Neurosurgeon  . Alcohol Use: No  . Drug Use: No  . Sexually Active: None   Other Topics Concern  . None   Social History Narrative  . None   Past Surgical History  Procedure Laterality Date  . Cataract extraction    . Knee surgery    . Spine surgery    . Eye surgery    . Neck surgery    . Tonsilectomy, adenoidectomy, bilateral myringotomy and tubes     Past Medical History  Diagnosis Date  . S/P lumbar fusion   . Hypertension   . Diabetes mellitus   . Cataract   . Stroke    BP 156/67  Pulse 69  Resp 16  Ht 5\' 7"  (1.702 m)  Wt 231 lb (104.781 kg)  BMI 36.17 kg/m2  SpO2 100%     Review of Systems  Musculoskeletal: Positive for gait problem.  All  other systems reviewed and are negative.       Objective:   Physical Exam Constitutional: He is oriented to person, place, and time. He appears well-developed and well-nourished.  HENT:  Head: Normocephalic and atraumatic.  Right Ear: External ear normal.  Left Ear: External ear normal.  Nose: Nose normal.  Mouth/Throat: Oropharynx is clear and moist.  Eyes: Conjunctivae are normal. Pupils are equal, round, and reactive to light.  Cardiovascular: Normal rate and regular rhythm.  Pulmonary/Chest: Effort normal and breath sounds normal. No respiratory distress. He has no wheezes.  Neurological: He is alert and oriented to person, place, and time.  Trace ank and eversion in the left leg. He has 1-2/5 plantar flexion. Knee extension is 3+ to 4/5. Knee flexion  is 4/5. Hip abduction and abduction are 4/5. He tended to walk with recurvatum at the left knee.he is using his AFO today. Minimal resting tone is seen. Reflexes 2++ on the left.  Stocking glove sensory loss in both feet specifically over the midfoot and into the toes.  Back is notable for tenderness to palpation at the L5-S1 levels predominantly with pain into the flanks bilateraly . Pain seems to be worse with flexion than extension today. SLR was positive. He walks favoring the left leg using his double upright afo. His posture is fair, but he can't help but to favor the side due to weakness.   Assessment & Plan:   1. 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 Pt with persistent back pain. And likely chronic L5, ?S1 Radiculopathy 2. Hx of right brain stroke with left hemiparesis. (new recent event?)  3. Diabetes with peripheral neuropathy  4. Hx of gout   Plan:  1.Will fill hydrocodone 5/325 for breakthrough pain q6hr prn #60 (he had previously gotten this through ortho)  2. Will start low dose trileptal for his neuropathic eg pain.    3. Continue robaxin for muscle spasm prn- which was refilled today 4. Reviewed posture and back exercises today. We discussed the importance for him to maintain posture and mechanics with anything he does. .  5. I'll see him back in about 3 months. 25 minutes of face to face patient care time were spent during this visit. All questions were encouraged and answered.

## 2013-02-09 ENCOUNTER — Telehealth: Payer: Self-pay

## 2013-02-09 MED ORDER — TIZANIDINE HCL 4 MG PO TABS: 2.0000 mg | ORAL_TABLET | Freq: Three times a day (TID) | ORAL | Status: DC | PRN

## 2013-02-09 NOTE — Telephone Encounter (Signed)
[facility] called and says patients insurance dose not cover methocarbamal but it will cover meloxicam, tizanadine or diclofenac.  Please change to one of these.

## 2013-02-09 NOTE — Telephone Encounter (Signed)
Interesting, because he was on it before....must have changed plan. Only one of those is a muscle relaxant. i will E-rx tizanidine

## 2013-05-10 ENCOUNTER — Encounter: Payer: Medicare Other | Attending: Physical Medicine & Rehabilitation | Admitting: Physical Medicine & Rehabilitation

## 2013-05-13 ENCOUNTER — Telehealth: Payer: Self-pay | Admitting: Hematology & Oncology

## 2013-05-13 NOTE — Telephone Encounter (Signed)
Faxed record back to [facility] and sent referral back, per Dr. Myna Hidalgo. Tiffany aware

## 2013-05-14 ENCOUNTER — Telehealth: Payer: Self-pay | Admitting: Hematology and Oncology

## 2013-05-14 NOTE — Telephone Encounter (Signed)
LEFT PT VM IN REF TO NP APPT. °

## 2013-05-17 ENCOUNTER — Telehealth: Payer: Self-pay | Admitting: Hematology and Oncology

## 2013-05-17 NOTE — Telephone Encounter (Signed)
C/D 05/17/13 for appt.10/21

## 2013-05-17 NOTE — Telephone Encounter (Signed)
S/W PT AND GVE NP APPT 10/21 @ 9:30 W/DR. GORSUCH REFERRING DR. Adrian Saran BLAND DX- ANEMIA WELCOME PACKET MAILED.

## 2013-06-08 ENCOUNTER — Telehealth: Payer: Self-pay | Admitting: Hematology and Oncology

## 2013-06-08 ENCOUNTER — Ambulatory Visit (HOSPITAL_BASED_OUTPATIENT_CLINIC_OR_DEPARTMENT_OTHER): Payer: Medicare Other | Admitting: Lab

## 2013-06-08 ENCOUNTER — Ambulatory Visit (HOSPITAL_BASED_OUTPATIENT_CLINIC_OR_DEPARTMENT_OTHER): Payer: Medicare Other | Admitting: Hematology and Oncology

## 2013-06-08 ENCOUNTER — Ambulatory Visit: Payer: Medicare Other

## 2013-06-08 ENCOUNTER — Encounter: Payer: Self-pay | Admitting: Hematology and Oncology

## 2013-06-08 VITALS — BP 177/85 | HR 83 | Temp 97.2°F | Resp 20 | Ht 67.0 in | Wt 237.1 lb

## 2013-06-08 DIAGNOSIS — D649 Anemia, unspecified: Secondary | ICD-10-CM | POA: Insufficient documentation

## 2013-06-08 DIAGNOSIS — D6489 Other specified anemias: Secondary | ICD-10-CM

## 2013-06-08 DIAGNOSIS — N189 Chronic kidney disease, unspecified: Secondary | ICD-10-CM

## 2013-06-08 DIAGNOSIS — Z8673 Personal history of transient ischemic attack (TIA), and cerebral infarction without residual deficits: Secondary | ICD-10-CM

## 2013-06-08 HISTORY — DX: Anemia, unspecified: D64.9

## 2013-06-08 LAB — CBC & DIFF AND RETIC
BASO%: 0.3 % (ref 0.0–2.0)
Basophils Absolute: 0 10*3/uL (ref 0.0–0.1)
Eosinophils Absolute: 0.2 10*3/uL (ref 0.0–0.5)
HCT: 34.5 % — ABNORMAL LOW (ref 38.4–49.9)
HGB: 11.3 g/dL — ABNORMAL LOW (ref 13.0–17.1)
LYMPH%: 41.8 % (ref 14.0–49.0)
MCH: 28 pg (ref 27.2–33.4)
MCV: 85.6 fL (ref 79.3–98.0)
MONO#: 0.5 10*3/uL (ref 0.1–0.9)
MONO%: 7.5 % (ref 0.0–14.0)
NEUT#: 3 10*3/uL (ref 1.5–6.5)
Platelets: 193 10*3/uL (ref 140–400)
RBC: 4.03 10*6/uL — ABNORMAL LOW (ref 4.20–5.82)
RDW: 13.7 % (ref 11.0–14.6)
Retic Ct Abs: 71.73 10*3/uL (ref 34.80–93.90)
WBC: 6.3 10*3/uL (ref 4.0–10.3)

## 2013-06-08 LAB — COMPREHENSIVE METABOLIC PANEL (CC13)
AST: 15 U/L (ref 5–34)
Albumin: 3.4 g/dL — ABNORMAL LOW (ref 3.5–5.0)
Alkaline Phosphatase: 69 U/L (ref 40–150)
BUN: 13.3 mg/dL (ref 7.0–26.0)
Calcium: 9.3 mg/dL (ref 8.4–10.4)
Chloride: 106 mEq/L (ref 98–109)
Glucose: 122 mg/dl (ref 70–140)
Potassium: 3.3 mEq/L — ABNORMAL LOW (ref 3.5–5.1)

## 2013-06-08 LAB — IRON AND TIBC CHCC
%SAT: 21 % (ref 20–55)
Iron: 42 ug/dL (ref 42–163)
TIBC: 196 ug/dL — ABNORMAL LOW (ref 202–409)
UIBC: 154 ug/dL (ref 117–376)

## 2013-06-08 LAB — T4, FREE: Free T4: 0.76 ng/dL — ABNORMAL LOW (ref 0.80–1.80)

## 2013-06-08 LAB — LACTATE DEHYDROGENASE (CC13): LDH: 155 U/L (ref 125–245)

## 2013-06-08 NOTE — Progress Notes (Signed)
Checked in new patient with no financial issues. He has appt card. °

## 2013-06-08 NOTE — Telephone Encounter (Signed)
gv and printed appt sched and avs for pt for NOV...sent pt to the lab

## 2013-06-08 NOTE — Progress Notes (Signed)
[facility] CONSULT NOTE  Patient Care Team: Renaye Rakers, MD as PCP - General (Family Medicine)  CHIEF COMPLAINTS/PURPOSE OF CONSULTATION:  Chronic anemia  HISTORY OF PRESENTING ILLNESS:  Luis Poole 72 y.o. male is here because of chronic anemia. I had the opportunity to review his blood counts from 2012. The patient had chronic anemia for long time. He had multiple surgeries in the past including shoulder surgery and he require several units of blood transfusions in the past. He denies ever donated blood. The patient denies any recent bleeding such as epistaxis, hematuria, or hematochezia. He is on anticoagulation therapy with aspirin due to a history of a stroke. He denies any pica. He eats a variety of diet. He does take occasional over-the-counter nonsteroidal anti-inflammatory medications for arthritis pain. The patient had recent colonoscopy which rule out GI bleed. He does not know whether he had EGD or not. He denies any symptoms of anemia apart from mild fatigue. Denies any chest pain, shortness of breath, lightheadedness, or muscle cramps. MEDICAL HISTORY:  Past Medical History  Diagnosis Date  . S/P lumbar fusion   . Hypertension   . Diabetes mellitus   . Cataract   . Stroke   . Anemia, unspecified 06/08/2013    SURGICAL HISTORY: Past Surgical History  Procedure Laterality Date  . Cataract extraction    . Knee surgery    . Spine surgery    . Eye surgery    . Neck surgery    . Tonsilectomy, adenoidectomy, bilateral myringotomy and tubes      SOCIAL HISTORY: History   Social History  . Marital Status: Single    Spouse Name: N/A    Number of Children: N/A  . Years of Education: N/A   Occupational History  . Not on file.   Social History Main Topics  . Smoking status: Former Games developer  . Smokeless tobacco: Former Neurosurgeon  . Alcohol Use: No  . Drug Use: No  . Sexual Activity: Not on file   Other Topics Concern  . Not on file   Social History  Narrative  . No narrative on file    FAMILY HISTORY: Family History  Problem Relation Age of Onset  . Stroke Mother     ALLERGIES:  has No Known Allergies.  MEDICATIONS:  Current Outpatient Prescriptions  Medication Sig Dispense Refill  . allopurinol (ZYLOPRIM) 300 MG tablet Take 300 mg by mouth every morning.      Marland Kitchen amLODipine (NORVASC) 10 MG tablet Take 10 mg by mouth every morning.       Marland Kitchen aspirin 81 MG tablet Take 81 mg by mouth daily.      . cloNIDine (CATAPRES - DOSED IN MG/24 HR) 0.3 mg/24hr Place 1 patch onto the skin once a week.       . COLCRYS 0.6 MG tablet Take 0.6 mg by mouth daily as needed. gout      . HYDROcodone-acetaminophen (NORCO/VICODIN) 5-325 MG per tablet Take 1 tablet by mouth every 6 (six) hours as needed for pain.  60 tablet  1  . indomethacin (INDOCIN SR) 75 MG CR capsule Take 75 mg by mouth daily with breakfast.       . lisinopril (PRINIVIL,ZESTRIL) 40 MG tablet Take 40 mg by mouth every morning.       . methocarbamol (ROBAXIN) 500 MG tablet Take 0.5-1 tablets (250-500 mg total) by mouth every 6 (six) hours as needed. Muscle spasms  60 tablet  4  . OXcarbazepine (  TRILEPTAL) 150 MG tablet Take 1 tablet (150 mg total) by mouth 2 (two) times daily.  60 tablet  5  . pravastatin (PRAVACHOL) 10 MG tablet Take 10 mg by mouth every evening.       . terbinafine (LAMISIL) 250 MG tablet Take 250 mg by mouth every evening.      Marland Kitchen tiZANidine (ZANAFLEX) 4 MG tablet Take 0.5 tablets (2 mg total) by mouth every 8 (eight) hours as needed.  30 tablet  3  . triamterene-hydrochlorothiazide (MAXZIDE-25) 37.5-25 MG per tablet Take 1 tablet by mouth every morning.        No current facility-administered medications for this visit.    REVIEW OF SYSTEMS:   Constitutional: Denies fevers, chills or abnormal night sweats Eyes: Denies blurriness of vision, double vision or watery eyes Ears, nose, mouth, throat, and face: Denies mucositis or sore throat Respiratory: Denies cough,  dyspnea or wheezes Cardiovascular: Denies palpitation, chest discomfort or lower extremity swelling Gastrointestinal:  Denies nausea, heartburn or change in bowel habits Skin: Denies abnormal skin rashes Lymphatics: Denies new lymphadenopathy or easy bruising Neurological:Denies numbness, tingling or new weaknesses Behavioral/Psych: Mood is stable, no new changes  All other systems were reviewed with the patient and are negative.  PHYSICAL EXAMINATION: ECOG PERFORMANCE STATUS: 1 - Symptomatic but completely ambulatory  Filed Vitals:   06/08/13 1001  BP: 177/85  Pulse: 83  Temp: 97.2 F (36.2 C)  Resp: 20   Filed Weights   06/08/13 1001  Weight: 237 lb 1.6 oz (107.548 kg)    GENERAL:alert, no distress and comfortable. The patient appears elderly and obese SKIN: skin color, texture, turgor are normal, no rashes or significant lesions EYES: normal, conjunctiva are pink and non-injected, sclera clear OROPHARYNX:no exudate, no erythema and lips, buccal mucosa, and tongue normal  NECK: supple, thyroid normal size, non-tender, without nodularity LYMPH:  no palpable lymphadenopathy in the cervical, axillary or inguinal LUNGS: clear to auscultation and percussion with normal breathing effort HEART: regular rate & rhythm and no murmurs and no lower extremity edema ABDOMEN:abdomen soft, non-tender and normal bowel sounds. Limited examination of the spleen area due to morbid obesity Musculoskeletal:no cyanosis of digits and no clubbing  PSYCH: alert & oriented x 3 with fluent speech NEURO: no focal motor/sensory deficits  LABORATORY DATA:  I have reviewed the data as listed Recent Results (from the past 2160 hour(s))  [facility] SMEAR     Status: None   Collection Time    06/08/13 11:00 AM      Result Value Range   Smear Result Smear Available    CBC & DIFF AND RETIC     Status: Abnormal   Collection Time    06/08/13 11:00 AM      Result Value Range   WBC 6.3  4.0 - 10.3 10e3/uL    NEUT# 3.0  1.5 - 6.5 10e3/uL   HGB 11.3 (*) 13.0 - 17.1 g/dL   HCT 57.8 (*) 46.9 - 62.9 %   Platelets 193  140 - 400 10e3/uL   MCV 85.6  79.3 - 98.0 fL   MCH 28.0  27.2 - 33.4 pg   MCHC 32.8  32.0 - 36.0 g/dL   RBC 5.28 (*) 4.13 - 2.44 10e6/uL   RDW 13.7  11.0 - 14.6 %   lymph# 2.6  0.9 - 3.3 10e3/uL   MONO# 0.5  0.1 - 0.9 10e3/uL   Eosinophils Absolute 0.2  0.0 - 0.5 10e3/uL   Basophils Absolute 0.0  0.0 -  0.1 10e3/uL   NEUT% 47.2  39.0 - 75.0 %   LYMPH% 41.8  14.0 - 49.0 %   MONO% 7.5  0.0 - 14.0 %   EOS% 3.2  0.0 - 7.0 %   BASO% 0.3  0.0 - 2.0 %   Retic % 1.78  0.80 - 1.80 %   Retic Ct Abs 71.73  34.80 - 93.90 10e3/uL   Immature Retic Fract 15.70 (*) 3.00 - 10.60 %  COMPREHENSIVE METABOLIC PANEL ([facility]13)     Status: Abnormal   Collection Time    06/08/13 11:01 AM      Result Value Range   Sodium 141  136 - 145 mEq/L   Potassium 3.3 (*) 3.5 - 5.1 mEq/L   Chloride 106  98 - 109 mEq/L   CO2 24  22 - 29 mEq/L   Glucose 122  70 - 140 mg/dl   BUN 16.1  7.0 - 09.6 mg/dL   Creatinine 1.3  0.7 - 1.3 mg/dL   Total Bilirubin 0.45  0.20 - 1.20 mg/dL   Alkaline Phosphatase 69  40 - 150 U/L   AST 15  5 - 34 U/L   ALT 10  0 - 55 U/L   Total Protein 7.5  6.4 - 8.3 g/dL   Albumin 3.4 (*) 3.5 - 5.0 g/dL   Calcium 9.3  8.4 - 40.9 mg/dL   Anion Gap 11  3 - 11 mEq/L   ASSESSMENT:  Chronic anemia, likely anemia of chronic disease  PLAN:  #1 chronic anemia This is likely anemia of chronic disease. I will order an additional workup for this. He is asymptomatic. He does require blood transfusion. He denies any recent bleeding. #2 mild chronic kidney disease His creatinine is in just at the borderline limit of normal. We'll monitored carefully. #3 history of stroke, on anticoagulation therapy The patient denies any bleeding. We will observe his blood count carefully while on anticoagulation therapy with aspirin. Orders Placed This Encounter  Procedures  . Iron and TIBC    Standing  Status: Future     Number of Occurrences: 1     Standing Expiration Date: 06/08/2014  . Ferritin    Standing Status: Future     Number of Occurrences: 1     Standing Expiration Date: 06/08/2014  . Folate RBC    Standing Status: Future     Number of Occurrences: 1     Standing Expiration Date: 06/08/2014  . Erythropoietin    Standing Status: Future     Number of Occurrences: 1     Standing Expiration Date: 06/08/2014  . Haptoglobin    Standing Status: Future     Number of Occurrences: 1     Standing Expiration Date: 06/08/2014  . Vitamin B12    Standing Status: Future     Number of Occurrences: 1     Standing Expiration Date: 06/08/2014  . Smear    Standing Status: Future     Number of Occurrences: 1     Standing Expiration Date: 06/08/2014  . Sedimentation rate    Standing Status: Future     Number of Occurrences: 1     Standing Expiration Date: 06/08/2014  . CBC & Diff and Retic    Standing Status: Future     Number of Occurrences: 1     Standing Expiration Date: 06/08/2014  . Comprehensive metabolic panel    Standing Status: Future     Number of Occurrences: 1  Standing Expiration Date: 06/08/2014  . Lactate dehydrogenase    Standing Status: Future     Number of Occurrences: 1     Standing Expiration Date: 06/08/2014  . T4, free    Standing Status: Future     Number of Occurrences: 1     Standing Expiration Date: 06/08/2014  . TSH    Standing Status: Future     Number of Occurrences: 1     Standing Expiration Date: 06/08/2014    All questions were answered. The patient knows to call the clinic with any problems, questions or concerns.   [facility], Izan Miron, MD 06/08/2013 12:35 PM

## 2013-06-09 LAB — HAPTOGLOBIN: Haptoglobin: 238 mg/dL — ABNORMAL HIGH (ref 45–215)

## 2013-06-09 LAB — VITAMIN B12: Vitamin B-12: 454 pg/mL (ref 211–911)

## 2013-06-09 IMAGING — CT CT HEAD W/O CM
2 series · 15 of 30 positions shown, 19 images · non-contrast
Comparison: CT of the cervical spine performed 07/21/2008

CLINICAL DATA: Left-sided weakness.  History of diabetes.

CT HEAD WITHOUT CONTRAST
TECHNIQUE: Contiguous axial images were obtained from the base of
the skull through the vertex without contrast.

[Series 2: head w/o · axial · non-contrast · 0.45mm/px · z∈[+1092,+1212]mm · 13 of 30 slices shown, 17 images]
[im 3/30  brain]
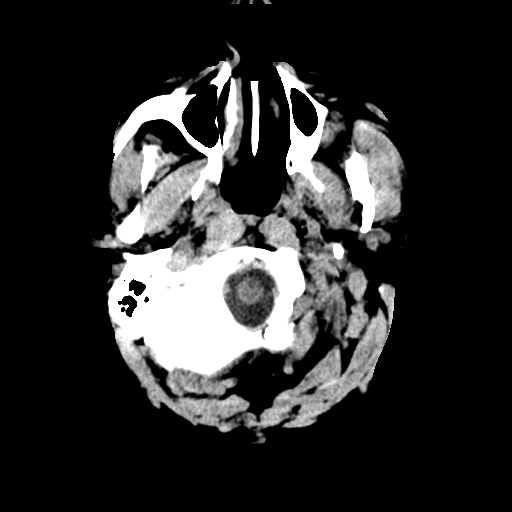
[im 3/30  bone]
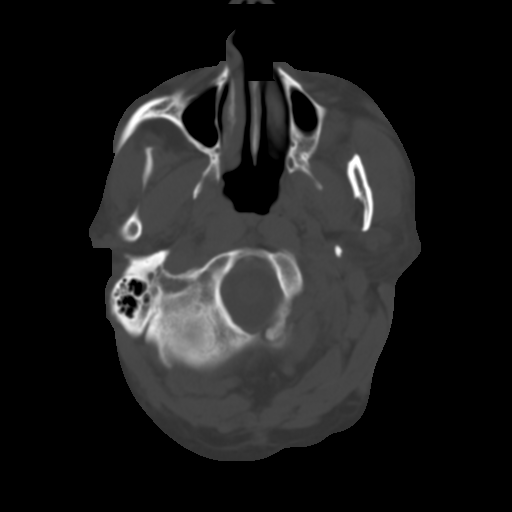
[im 5/30  brain]
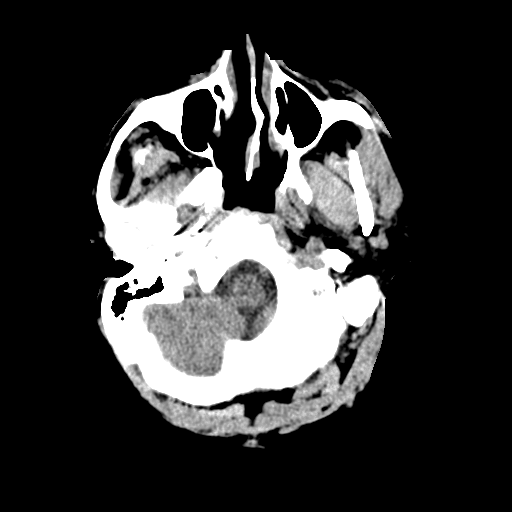
[im 7/30  brain]
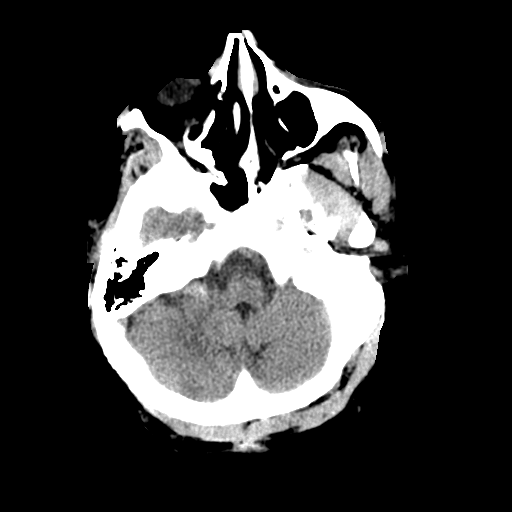
[im 9/30  brain]
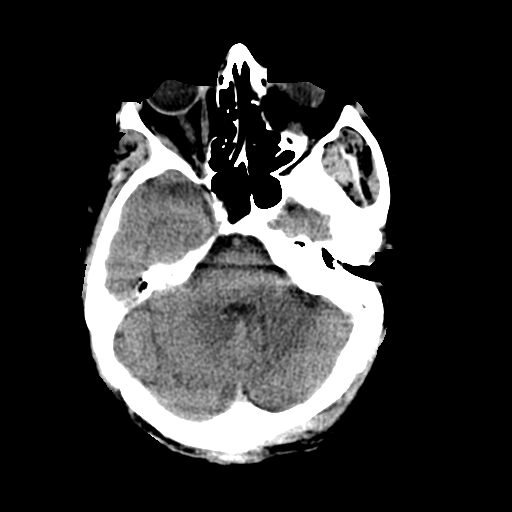
[im 11/30  brain]
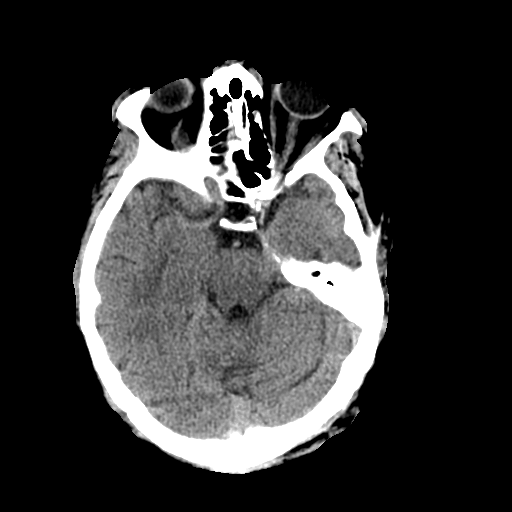
[im 11/30  bone]
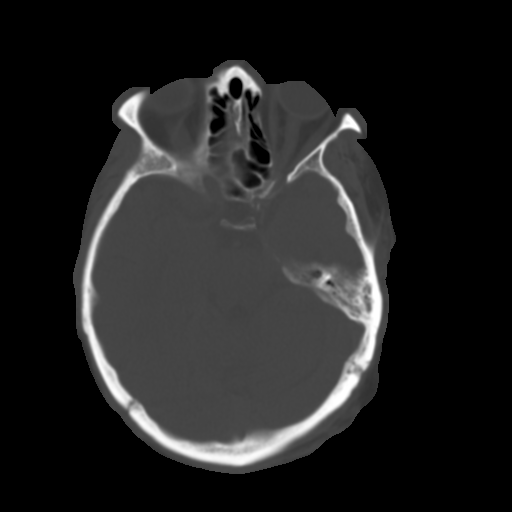
[im 13/30  brain]
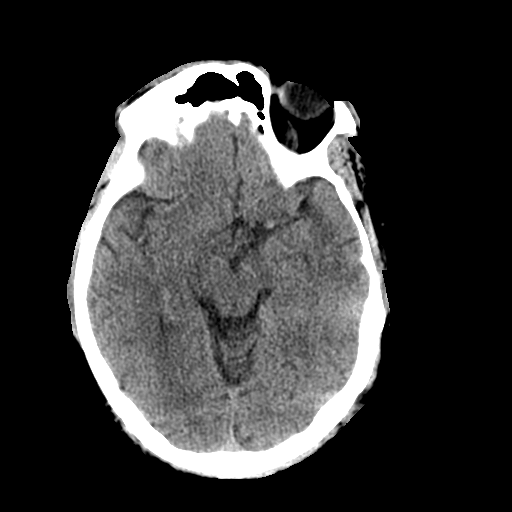
[im 15/30  brain]
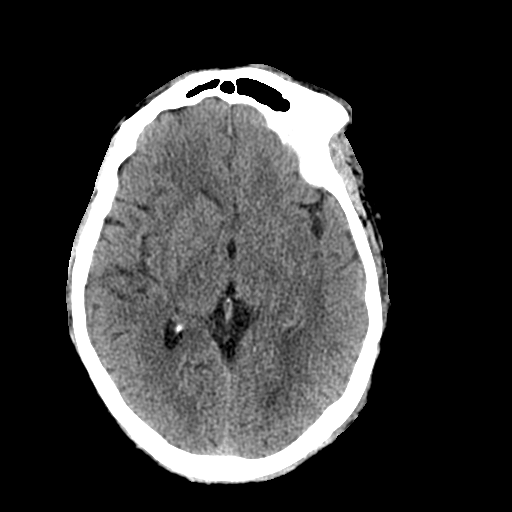
[im 17/30  brain]
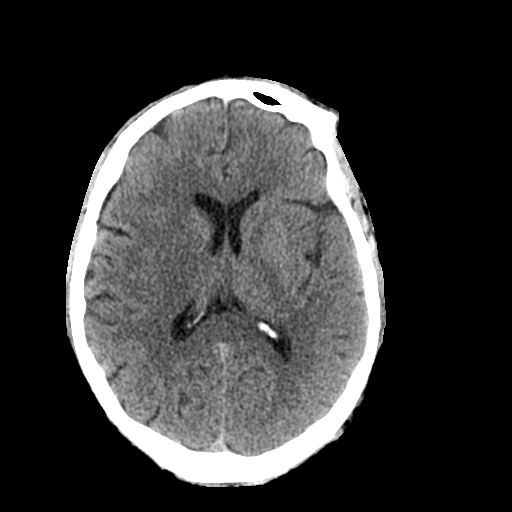
[im 19/30  brain]
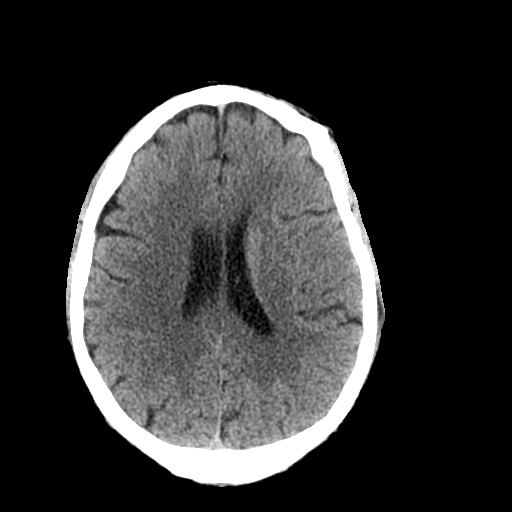
[im 19/30  bone]
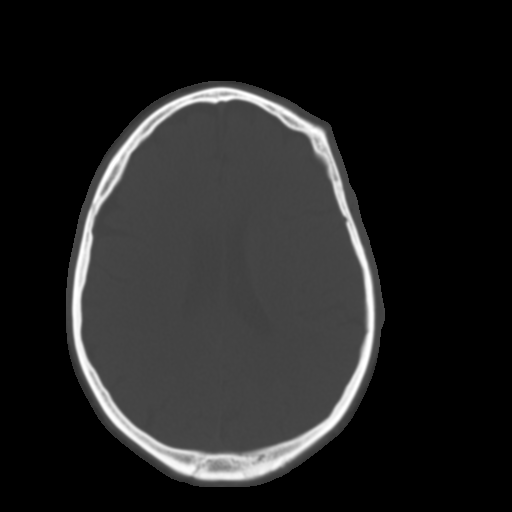
[im 21/30  brain]
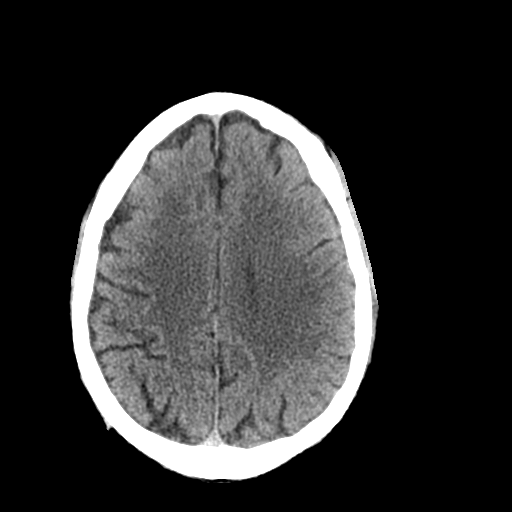
[im 23/30  brain]
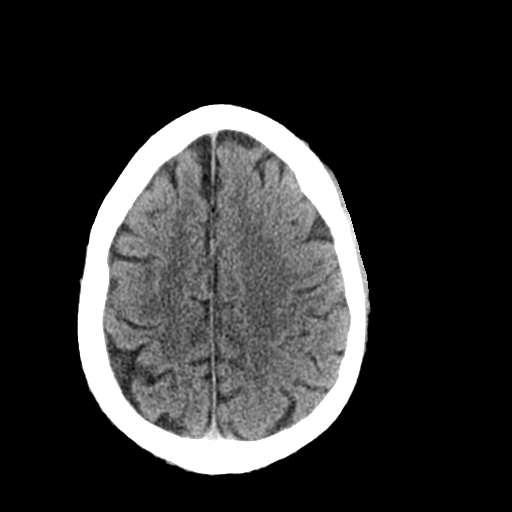
[im 25/30  brain]
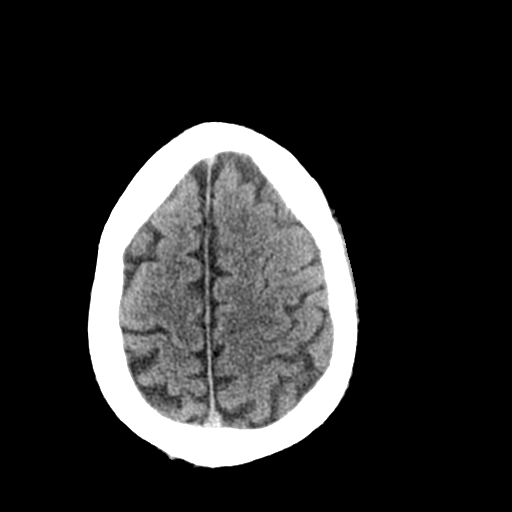
[im 27/30  brain]
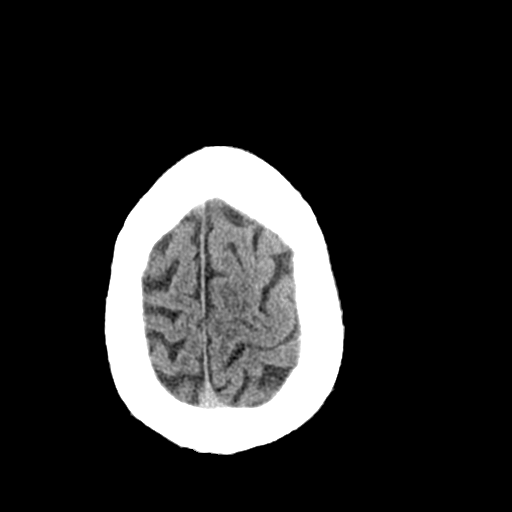
[im 27/30  bone]
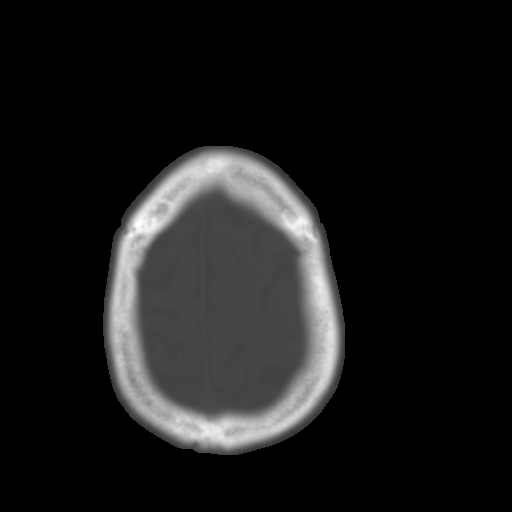

[Series 3: bone window · axial · 0.45mm/px · z∈[+1092,+1112]mm · 2 of 30 slices shown]
[im 3/30  bone]
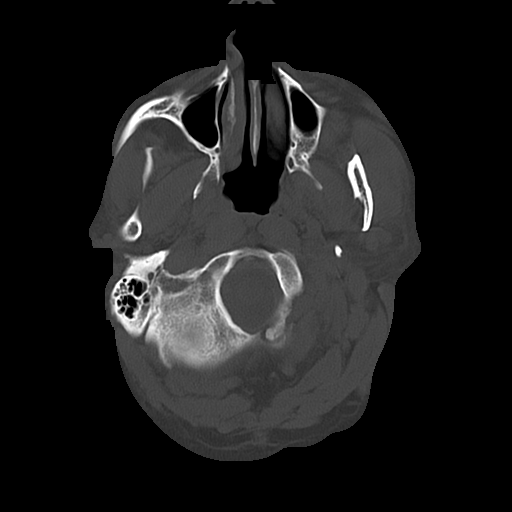
[im 7/30  bone]
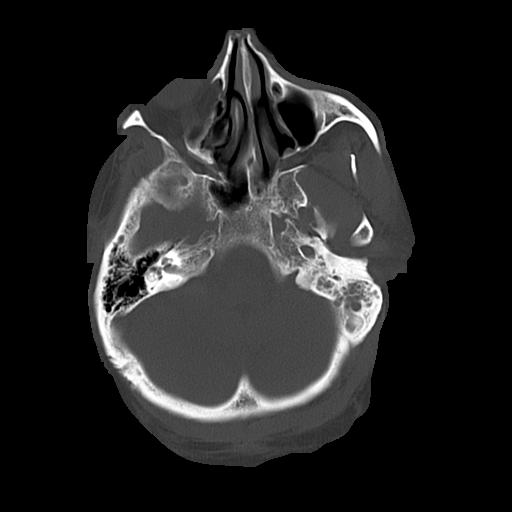

[15 of 30 positions shown; findings below may reference images not displayed]

FINDINGS: There is no evidence of acute infarction, mass lesion, or
intra- or extra-axial hemorrhage on CT.

Small chronic lacunar infarcts are noted within the cerebellar
hemispheres bilaterally.

The brainstem and fourth ventricle are within normal limits.  The
third and lateral ventricles, and basal ganglia are unremarkable in
appearance.  The cerebral hemispheres are symmetric in appearance,
with normal gray-white differentiation.  No mass effect or midline
shift is seen.

There is no evidence of fracture; visualized osseous structures are
unremarkable in appearance.  The orbits are within normal limits.
There is complete opacification of the left mastoid air cells; the
paranasal sinuses and right mastoid air cells are well-aerated.
Mild soft tissue swelling is noted overlying the left
frontoparietal calvarium.
IMPRESSION: 1.  No acute intracranial pathology seen on CT.
2.  Small chronic lacunar infarcts within the cerebellar
hemispheres bilaterally.
3.  Mild soft tissue swelling overlying the left frontoparietal
calvarium.
4.  Complete opacification of the left mastoid air cells.

## 2013-06-22 ENCOUNTER — Ambulatory Visit (HOSPITAL_BASED_OUTPATIENT_CLINIC_OR_DEPARTMENT_OTHER): Payer: Medicare Other | Admitting: Hematology and Oncology

## 2013-06-22 VITALS — BP 181/92 | HR 75 | Temp 97.1°F | Resp 18 | Ht 67.0 in | Wt 236.9 lb

## 2013-06-22 DIAGNOSIS — N189 Chronic kidney disease, unspecified: Secondary | ICD-10-CM

## 2013-06-22 DIAGNOSIS — D649 Anemia, unspecified: Secondary | ICD-10-CM

## 2013-06-22 NOTE — Progress Notes (Signed)
[facility] OFFICE PROGRESS NOTE  Luis Pitter, MD DIAGNOSIS:  Anemia chronic disease  SUMMARY OF HEMATOLOGIC HISTORY: Luis Poole 72 y.o. male is here because of chronic anemia. I had the opportunity to review his blood counts from 2012. The patient had chronic anemia for long time. He had multiple surgeries in the past including shoulder surgery and he require several units of blood transfusions in the past. The patient had recent colonoscopy which rule out GI bleed. He does not know whether he had EGD or not. INTERVAL HISTORY: Luis Poole 72 y.o. male returns for further followup. He is feeling fine. The patient is agitated that he has to come back many times for blood work and a followup appointment. It is aggravated because he has not received his new shoes for his diabetic feet. The patient denies any recent signs or symptoms of bleeding such as spontaneous epistaxis, hematuria or hematochezia. He has no signs and symptoms of fatigue, shortness of breath on exertion, or dizziness.  I have reviewed the past medical history, past surgical history, social history and family history with the patient and they are unchanged from previous note.  ALLERGIES:  has No Known Allergies.  MEDICATIONS:  Current Outpatient Prescriptions  Medication Sig Dispense Refill  . allopurinol (ZYLOPRIM) 300 MG tablet Take 300 mg by mouth every morning.      Marland Kitchen amLODipine (NORVASC) 10 MG tablet Take 10 mg by mouth every morning.       Marland Kitchen aspirin 81 MG tablet Take 81 mg by mouth daily.      . cloNIDine (CATAPRES - DOSED IN MG/24 HR) 0.3 mg/24hr Place 1 patch onto the skin once a week.       . COLCRYS 0.6 MG tablet Take 0.6 mg by mouth daily as needed. gout      . HYDROcodone-acetaminophen (NORCO/VICODIN) 5-325 MG per tablet Take 1 tablet by mouth every 6 (six) hours as needed for pain.  60 tablet  1  . indomethacin (INDOCIN SR) 75 MG CR capsule Take 75 mg by mouth daily with breakfast.       .  lisinopril (PRINIVIL,ZESTRIL) 40 MG tablet Take 40 mg by mouth every morning.       . methocarbamol (ROBAXIN) 500 MG tablet Take 0.5-1 tablets (250-500 mg total) by mouth every 6 (six) hours as needed. Muscle spasms  60 tablet  4  . OXcarbazepine (TRILEPTAL) 150 MG tablet Take 1 tablet (150 mg total) by mouth 2 (two) times daily.  60 tablet  5  . pravastatin (PRAVACHOL) 10 MG tablet Take 10 mg by mouth every evening.       . terbinafine (LAMISIL) 250 MG tablet Take 250 mg by mouth every evening.      Marland Kitchen tiZANidine (ZANAFLEX) 4 MG tablet Take 0.5 tablets (2 mg total) by mouth every 8 (eight) hours as needed.  30 tablet  3  . triamterene-hydrochlorothiazide (MAXZIDE-25) 37.5-25 MG per tablet Take 1 tablet by mouth every morning.        No current facility-administered medications for this visit.     REVIEW OF SYSTEMS:   Constitutional: Denies fevers, chills or night sweats All other systems were reviewed with the patient and are negative.  PHYSICAL EXAMINATION: ECOG PERFORMANCE STATUS: 1 - Symptomatic but completely ambulatory  Filed Vitals:   06/22/13 1212  BP: 181/92  Pulse: 75  Temp: 97.1 F (36.2 C)  Resp: 18   Filed Weights   06/22/13 1212  Weight: 236  lb 14.4 oz (107.457 kg)    GENERAL:alert, no distress and comfortable. The patient and is mildly obese NEURO: alert & oriented x 3 with fluent speech, no focal motor/sensory deficits  LABORATORY DATA:  I have reviewed the data as listed  ASSESSMENT:  Anemia chronic disease  PLAN:  #1 chronic anemia This is likely anemia of chronic disease. He is asymptomatic. He denies any recent bleeding. His blood count has been stable for the last 10 months. There is no indication to treat. #2 mild chronic kidney disease His creatinine is in just at the borderline limit of normal. We'll monitored carefully. #3 history of stroke, on anticoagulation therapy The patient denies any bleeding. We will observe his blood count carefully while  on anticoagulation therapy with aspirin.  The patient is aggravated that he has to come back to follow on the anemia. I recommend he followup with his primary care provider with periodic bloodwork. If his hemoglobin dropped to less than 10 g in the future, we'll bring him back. The patient is in agreement with the plan. All questions were answered. The patient knows to call the clinic with any problems, questions or concerns. No barriers to learning was detected.  I spent 15 minutes counseling the patient face to face. The total time spent in the appointment was 20 minutes and more than 50% was on counseling.     Louay Myrie, MD 06/22/2013 1:26 PM

## 2013-08-22 ENCOUNTER — Observation Stay (HOSPITAL_COMMUNITY)
Admission: EM | Admit: 2013-08-22 | Discharge: 2013-08-24 | Disposition: A | Discharge status: Home / Self Care | Payer: Medicare Other | Attending: Internal Medicine | Admitting: Internal Medicine

## 2013-08-22 ENCOUNTER — Encounter (HOSPITAL_COMMUNITY): Payer: Self-pay | Admitting: Emergency Medicine

## 2013-08-22 DIAGNOSIS — I639 Cerebral infarction, unspecified: Secondary | ICD-10-CM

## 2013-08-22 DIAGNOSIS — E119 Type 2 diabetes mellitus without complications: Secondary | ICD-10-CM | POA: Diagnosis present

## 2013-08-22 DIAGNOSIS — N179 Acute kidney failure, unspecified: Secondary | ICD-10-CM

## 2013-08-22 DIAGNOSIS — Z87891 Personal history of nicotine dependence: Secondary | ICD-10-CM | POA: Insufficient documentation

## 2013-08-22 DIAGNOSIS — R7989 Other specified abnormal findings of blood chemistry: Secondary | ICD-10-CM

## 2013-08-22 DIAGNOSIS — N1831 Chronic kidney disease, stage 3a: Secondary | ICD-10-CM | POA: Diagnosis present

## 2013-08-22 DIAGNOSIS — Z7982 Long term (current) use of aspirin: Secondary | ICD-10-CM | POA: Insufficient documentation

## 2013-08-22 DIAGNOSIS — G8194 Hemiplegia, unspecified affecting left nondominant side: Secondary | ICD-10-CM

## 2013-08-22 DIAGNOSIS — I10 Essential (primary) hypertension: Secondary | ICD-10-CM

## 2013-08-22 DIAGNOSIS — G629 Polyneuropathy, unspecified: Secondary | ICD-10-CM

## 2013-08-22 DIAGNOSIS — M5417 Radiculopathy, lumbosacral region: Secondary | ICD-10-CM

## 2013-08-22 DIAGNOSIS — Z8673 Personal history of transient ischemic attack (TIA), and cerebral infarction without residual deficits: Secondary | ICD-10-CM

## 2013-08-22 DIAGNOSIS — I69959 Hemiplegia and hemiparesis following unspecified cerebrovascular disease affecting unspecified side: Secondary | ICD-10-CM | POA: Insufficient documentation

## 2013-08-22 DIAGNOSIS — M961 Postlaminectomy syndrome, not elsewhere classified: Secondary | ICD-10-CM

## 2013-08-22 DIAGNOSIS — E1142 Type 2 diabetes mellitus with diabetic polyneuropathy: Secondary | ICD-10-CM

## 2013-08-22 DIAGNOSIS — M7989 Other specified soft tissue disorders: Secondary | ICD-10-CM | POA: Diagnosis present

## 2013-08-22 DIAGNOSIS — I5032 Chronic diastolic (congestive) heart failure: Secondary | ICD-10-CM

## 2013-08-22 DIAGNOSIS — D649 Anemia, unspecified: Secondary | ICD-10-CM

## 2013-08-22 DIAGNOSIS — N183 Chronic kidney disease, stage 3 unspecified: Secondary | ICD-10-CM

## 2013-08-22 DIAGNOSIS — E1169 Type 2 diabetes mellitus with other specified complication: Secondary | ICD-10-CM | POA: Diagnosis present

## 2013-08-22 DIAGNOSIS — IMO0002 Reserved for concepts with insufficient information to code with codable children: Secondary | ICD-10-CM

## 2013-08-22 DIAGNOSIS — R55 Syncope and collapse: Secondary | ICD-10-CM

## 2013-08-22 DIAGNOSIS — M109 Gout, unspecified: Principal | ICD-10-CM

## 2013-08-22 DIAGNOSIS — I129 Hypertensive chronic kidney disease with stage 1 through stage 4 chronic kidney disease, or unspecified chronic kidney disease: Secondary | ICD-10-CM | POA: Insufficient documentation

## 2013-08-22 DIAGNOSIS — Z981 Arthrodesis status: Secondary | ICD-10-CM | POA: Insufficient documentation

## 2013-08-22 LAB — CBC WITH DIFFERENTIAL/PLATELET
BASOS ABS: 0 10*3/uL (ref 0.0–0.1)
BASOS PCT: 0 % (ref 0–1)
EOS PCT: 3 % (ref 0–5)
Eosinophils Absolute: 0.2 10*3/uL (ref 0.0–0.7)
HCT: 31.8 % — ABNORMAL LOW (ref 39.0–52.0)
Hemoglobin: 10.5 g/dL — ABNORMAL LOW (ref 13.0–17.0)
Lymphocytes Relative: 44 % (ref 12–46)
Lymphs Abs: 2.6 10*3/uL (ref 0.7–4.0)
MCH: 28.6 pg (ref 26.0–34.0)
MCHC: 33 g/dL (ref 30.0–36.0)
MCV: 86.6 fL (ref 78.0–100.0)
Monocytes Absolute: 0.4 10*3/uL (ref 0.1–1.0)
Monocytes Relative: 6 % (ref 3–12)
Neutro Abs: 2.7 10*3/uL (ref 1.7–7.7)
Neutrophils Relative %: 46 % (ref 43–77)
Platelets: 207 10*3/uL (ref 150–400)
RBC: 3.67 MIL/uL — ABNORMAL LOW (ref 4.22–5.81)
RDW: 14 % (ref 11.5–15.5)
WBC: 6 10*3/uL (ref 4.0–10.5)

## 2013-08-22 LAB — D-DIMER, QUANTITATIVE (NOT AT ARMC): D DIMER QUANT: 1.2 ug{FEU}/mL — AB (ref 0.00–0.48)

## 2013-08-22 LAB — BASIC METABOLIC PANEL
BUN: 23 mg/dL (ref 6–23)
CALCIUM: 8.9 mg/dL (ref 8.4–10.5)
CO2: 26 meq/L (ref 19–32)
CREATININE: 1.54 mg/dL — AB (ref 0.50–1.35)
Chloride: 102 mEq/L (ref 96–112)
GFR, EST AFRICAN AMERICAN: 51 mL/min — AB (ref >90)
GFR, EST NON AFRICAN AMERICAN: 44 mL/min — AB (ref >90)
Glucose, Bld: 106 mg/dL — ABNORMAL HIGH (ref 70–99)
Potassium: 4 mEq/L (ref 3.7–5.3)
SODIUM: 141 meq/L (ref 137–147)

## 2013-08-22 LAB — TROPONIN I

## 2013-08-22 MED ORDER — ENOXAPARIN SODIUM 120 MG/0.8ML ~~LOC~~ SOLN
110.0000 mg | Freq: Once | SUBCUTANEOUS | Status: AC
  Administered 2013-08-23: 110 mg via SUBCUTANEOUS
  Filled 2013-08-22: qty 0.8

## 2013-08-22 NOTE — ED Provider Notes (Signed)
Patient reports he's had left leg swelling since he had his stroke in 2012. Swelling has become worse in the past 3 days. Has improved since he has been supine in the emergency department. He also reports he suffered a near syncopal event today while standing in his kitchen. He's had 2 prior near syncopal event last week. He denies any chest pain denies shortness of breath. Presently complains only of mild left leg pain. On exam no distress lungs clear auscultation heart regular rate and rhythm left lower extremity with 3+ edema. Surgical scar over in Knee. Neurovascular intact. Lower extremities no redness or tenderness neurovascularly  Doug SouSam Pride Gonzales, MD 08/23/13 [id number]

## 2013-08-22 NOTE — ED Notes (Signed)
Pt in c/o left leg swelling that has gotten progressively worse over the last week, increased pain with ambulation, denies redness, CMS intact

## 2013-08-22 NOTE — ED Provider Notes (Signed)
CSN: [id number]     Arrival date & time 08/22/13  1914 History   First MD Initiated Contact with Patient 08/22/13 2018     Chief Complaint  Patient presents with  . Leg Swelling    HPI  Luis Poole is a 73 y.o. male with a PMH of HTN, DM, stroke, anemia, lumbar fusion who presents to the ED for evaluation of leg swelling.  History was provided by the patient.  Patient states that he has had left leg swelling for the past two weeks.  He has a hx of left leg swelling and pain in the past but it has never "been this bad."  He states he had a left knee replacement in the past many years ago and has had residual swelling ever since but nothing like today.  He states he had a CVA in 2012 which resulted in residual left sided LE weakness.  He wears a left leg brace, which he states has been rubbing on his left foot.  He denies any injuries or trauma.  He states that today he was in the kitchen when he turned around suddenly and his "right leg gave out on him."  He states he was able to brace his fall and slid to the floor.  His daughter helped him up and he has not had any right sided weakness since.  He states he did feel lightheaded at the time of his weakness but denies any syncope, chest pain, palpitations, headache, dizziness, or lightheadedness.  He otherwise has been well with no fevers, chills, change in appetite/activity, rhinorrhea, congestion, abdominal pain, nausea, emesis, or dysuria.  No hx of DVT/PE in the past.     Past Medical History  Diagnosis Date  . S/P lumbar fusion   . Hypertension   . Diabetes mellitus   . Cataract   . Stroke   . Anemia, unspecified 06/08/2013   Past Surgical History  Procedure Laterality Date  . Cataract extraction    . Knee surgery    . Spine surgery    . Eye surgery    . Neck surgery    . Tonsilectomy, adenoidectomy, bilateral myringotomy and tubes     Family History  Problem Relation Age of Onset  . Stroke Mother    History  Substance Use  Topics  . Smoking status: Former Games developer  . Smokeless tobacco: Former Neurosurgeon  . Alcohol Use: No    Review of Systems  Constitutional: Negative for fever, chills, diaphoresis, activity change, appetite change and fatigue.  HENT: Negative for congestion, rhinorrhea and sore throat.   Respiratory: Negative for cough, shortness of breath and wheezing.   Cardiovascular: Positive for leg swelling. Negative for chest pain.  Gastrointestinal: Negative for nausea, vomiting, abdominal pain, diarrhea and constipation.  Genitourinary: Negative for dysuria.  Musculoskeletal: Negative for back pain, joint swelling and myalgias.  Skin: Negative for wound.  Neurological: Positive for light-headedness. Negative for dizziness, syncope, weakness, numbness and headaches.    Allergies  Review of patient's allergies indicates no known allergies.  Home Medications   Current Outpatient Rx  Name  Route  Sig  Dispense  Refill  . allopurinol (ZYLOPRIM) 300 MG tablet   Oral   Take 300 mg by mouth every morning.         Marland Kitchen amLODipine (NORVASC) 10 MG tablet   Oral   Take 10 mg by mouth every morning.          Marland Kitchen aspirin 325 MG tablet  Oral   Take 325 mg by mouth daily.         Marland Kitchen COLCRYS 0.6 MG tablet   Oral   Take 0.6 mg by mouth daily as needed. gout         . gabapentin (NEURONTIN) 300 MG capsule   Oral   Take 300 mg by mouth 2 (two) times daily.         Marland Kitchen HYDROcodone-acetaminophen (NORCO) 7.5-325 MG per tablet   Oral   Take 1 tablet by mouth every 6 (six) hours as needed for moderate pain.         . indomethacin (INDOCIN SR) 75 MG CR capsule   Oral   Take 75 mg by mouth daily with breakfast.          . lisinopril (PRINIVIL,ZESTRIL) 40 MG tablet   Oral   Take 40 mg by mouth every morning.          . methocarbamol (ROBAXIN) 500 MG tablet   Oral   Take 0.5-1 tablets (250-500 mg total) by mouth every 6 (six) hours as needed. Muscle spasms   60 tablet   4   . OXcarbazepine  (TRILEPTAL) 150 MG tablet   Oral   Take 1 tablet (150 mg total) by mouth 2 (two) times daily.   60 tablet   5   . pravastatin (PRAVACHOL) 10 MG tablet   Oral   Take 10 mg by mouth every evening.          Marland Kitchen tiZANidine (ZANAFLEX) 4 MG tablet   Oral   Take 0.5 tablets (2 mg total) by mouth every 8 (eight) hours as needed.   30 tablet   3   . triamterene-hydrochlorothiazide (MAXZIDE-25) 37.5-25 MG per tablet   Oral   Take 1 tablet by mouth every morning.          . cloNIDine (CATAPRES - DOSED IN MG/24 HR) 0.3 mg/24hr   Transdermal   Place 1 patch onto the skin once a week.           BP 147/79  Pulse 92  Temp(Src) 97.5 F (36.4 C) (Oral)  Resp 16  SpO2 99%  Filed Vitals:   08/22/13 2100 08/22/13 2215 08/22/13 2300 08/23/13 0046  BP: 127/84 138/74 137/88 151/93  Pulse: 82 80 74 72  Temp:      TempSrc:      Resp:   13 16  SpO2: 99% 98% 100% 99%    Physical Exam  Nursing note and vitals reviewed. Constitutional: He is oriented to person, place, and time. He appears well-developed and well-nourished. No distress.  HENT:  Head: Normocephalic and atraumatic.  Right Ear: External ear normal.  Left Ear: External ear normal.  Nose: Nose normal.  Mouth/Throat: Oropharynx is clear and moist. No oropharyngeal exudate.  Eyes: Conjunctivae are normal. Right eye exhibits no discharge. Left eye exhibits no discharge.  Neck: Normal range of motion. Neck supple.  Cardiovascular: Normal rate, regular rhythm, normal heart sounds and intact distal pulses.  Exam reveals no gallop and no friction rub.   No murmur heard. Dorsalis pedis pulses present and equal bilaterally.  Pulmonary/Chest: Effort normal and breath sounds normal. No respiratory distress. He has no wheezes. He has no rales. He exhibits no tenderness.  Abdominal: Soft. He exhibits no distension and no mass. There is no tenderness. There is no rebound and no guarding.  Musculoskeletal: Normal range of motion. He  exhibits edema. He exhibits no tenderness.  Pitting left  leg edema throughout.  Patient unable to dorsiflex or plantarflex left foot.  Patient able to left left leg against gravity. Strength 5/5 on the right.  Strength 5/5 in the UE bilaterally.  No erythema or warmth throughout the LE bilaterally.    Neurological: He is alert and oriented to person, place, and time.  GCS 15.  No focal neurological deficits.  CN 2-12 intact.    Skin: Skin is warm and dry. He is not diaphoretic.  No wounds, ecchymosis to the LE bilaterally.  1 cm clear fluid filled blister to the left dorsum of the foot.      ED Course  Procedures (including critical care time) Labs Review Labs Reviewed - No data to display Imaging Review No results found.  EKG Interpretation    Date/Time:  Sunday August 22 2013 22:30:20 EST Ventricular Rate:  76 PR Interval:  219 QRS Duration: 76 QT Interval:  367 QTC Calculation: 413 R Axis:   37 Text Interpretation:  Sinus rhythm Borderline prolonged PR interval Low voltage, precordial leads Borderline T wave abnormalities No significant change since last tracing Confirmed by Ethelda ChickJACUBOWITZ  MD, SAM ([id number]) on 08/23/2013 12:16:30 AM           Results for orders placed during the hospital encounter of 08/22/13  CBC WITH DIFFERENTIAL      Result Value Range   WBC 6.0  4.0 - 10.5 K/uL   RBC 3.67 (*) 4.22 - 5.81 MIL/uL   Hemoglobin 10.5 (*) 13.0 - 17.0 g/dL   HCT 95.631.8 (*) 21.339.0 - 08.652.0 %   MCV 86.6  78.0 - 100.0 fL   MCH 28.6  26.0 - 34.0 pg   MCHC 33.0  30.0 - 36.0 g/dL   RDW 57.814.0  46.911.5 - 62.915.5 %   Platelets 207  150 - 400 K/uL   Neutrophils Relative % 46  43 - 77 %   Neutro Abs 2.7  1.7 - 7.7 K/uL   Lymphocytes Relative 44  12 - 46 %   Lymphs Abs 2.6  0.7 - 4.0 K/uL   Monocytes Relative 6  3 - 12 %   Monocytes Absolute 0.4  0.1 - 1.0 K/uL   Eosinophils Relative 3  0 - 5 %   Eosinophils Absolute 0.2  0.0 - 0.7 K/uL   Basophils Relative 0  0 - 1 %   Basophils Absolute 0.0   0.0 - 0.1 K/uL  BASIC METABOLIC PANEL      Result Value Range   Sodium 141  137 - 147 mEq/L   Potassium 4.0  3.7 - 5.3 mEq/L   Chloride 102  96 - 112 mEq/L   CO2 26  19 - 32 mEq/L   Glucose, Bld 106 (*) 70 - 99 mg/dL   BUN 23  6 - 23 mg/dL   Creatinine, Ser 5.281.54 (*) 0.50 - 1.35 mg/dL   Calcium 8.9  8.4 - 41.310.5 mg/dL   GFR calc non Af Amer 44 (*) >90 mL/min   GFR calc Af Amer 51 (*) >90 mL/min  TROPONIN I      Result Value Range   Troponin I <0.30  <0.30 ng/mL  D-DIMER, QUANTITATIVE      Result Value Range   D-Dimer, Quant 1.20 (*) 0.00 - 0.48 ug/mL-FEU    MDM   Luis Poole is a 73 y.o. male with a PMH of HTN, DM, stroke, anemia, lumbar fusion who presents to the ED for evaluation of leg swelling.  Consults  1:00 AM = Spoke with Dr. Allena Katz who agrees to admit for observation       Patient evaluated in the emergency department for left leg swelling. Etiology of leg swelling possibly due to to a DVT. Doppler study unavailable at this time of night. D-dimer elevated at 1.2. Patient started on Lovenox. No signs of infection on exam. Patient afebrile and nontoxic in appearance. No leukocytosis. Patient also complained of a pre-syncopal episode today and spoke of other presyncopal episodes throughout the week. Troponin negative. EKG negative for any acute ischemic changes. Labs revealed an elevated creatinine of 1.54 as well as stable anemia.  He had no complaints of chest pain, shortness of breath, lightheadedness, dizziness, or palpitations throughout his ED visit.  Patient has residual left sided weakness from a prior CVA with no acute changes per patient.  Patient in agreement with admission and plan.    Final impressions: 1. Left leg swelling   2. AKI (acute kidney injury)   3. Anemia   4. Pre-syncope       Luiz Iron PA-C   This patient was discussed with Dr. Barbarann Ehlers, PA-C 08/23/13 [phone]

## 2013-08-22 NOTE — ED Notes (Signed)
Pt has residual weakness on left side due to previous stroke. Pt c/o left lower leg pain started 1 week ago. Pain same 10/10- skin warm and dry and has good cap refill. Pt foot tender when RN palpating pt sts "It hurts because of my stroke." Pt denies smoking and long travel. Pt has small circular fluid filled pocket on left foot appx 2.5cm diameter

## 2013-08-23 ENCOUNTER — Inpatient Hospital Stay (HOSPITAL_COMMUNITY): Payer: Medicare Other

## 2013-08-23 ENCOUNTER — Encounter (HOSPITAL_COMMUNITY): Payer: Self-pay | Admitting: Cardiology

## 2013-08-23 DIAGNOSIS — IMO0002 Reserved for concepts with insufficient information to code with codable children: Secondary | ICD-10-CM

## 2013-08-23 DIAGNOSIS — I5032 Chronic diastolic (congestive) heart failure: Secondary | ICD-10-CM | POA: Diagnosis present

## 2013-08-23 DIAGNOSIS — R7989 Other specified abnormal findings of blood chemistry: Secondary | ICD-10-CM | POA: Diagnosis present

## 2013-08-23 DIAGNOSIS — M79609 Pain in unspecified limb: Secondary | ICD-10-CM

## 2013-08-23 DIAGNOSIS — N183 Chronic kidney disease, stage 3 unspecified: Secondary | ICD-10-CM | POA: Diagnosis present

## 2013-08-23 DIAGNOSIS — N1831 Chronic kidney disease, stage 3a: Secondary | ICD-10-CM | POA: Diagnosis present

## 2013-08-23 DIAGNOSIS — I6350 Cerebral infarction due to unspecified occlusion or stenosis of unspecified cerebral artery: Secondary | ICD-10-CM

## 2013-08-23 DIAGNOSIS — N179 Acute kidney failure, unspecified: Secondary | ICD-10-CM

## 2013-08-23 DIAGNOSIS — M109 Gout, unspecified: Secondary | ICD-10-CM | POA: Diagnosis present

## 2013-08-23 DIAGNOSIS — I10 Essential (primary) hypertension: Secondary | ICD-10-CM

## 2013-08-23 DIAGNOSIS — M7989 Other specified soft tissue disorders: Secondary | ICD-10-CM | POA: Diagnosis present

## 2013-08-23 DIAGNOSIS — G629 Polyneuropathy, unspecified: Secondary | ICD-10-CM | POA: Insufficient documentation

## 2013-08-23 LAB — HEPATIC FUNCTION PANEL
ALT: 11 U/L (ref 0–53)
AST: 17 U/L (ref 0–37)
Albumin: 3.3 g/dL — ABNORMAL LOW (ref 3.5–5.2)
Alkaline Phosphatase: 73 U/L (ref 39–117)
Bilirubin, Direct: <0.2 mg/dL (ref 0.0–0.3)
Total Protein: 7.2 g/dL (ref 6.0–8.3)

## 2013-08-23 LAB — PROTIME-INR
INR: 1.1 (ref 0.00–1.49)
Prothrombin Time: 14 seconds (ref 11.6–15.2)

## 2013-08-23 LAB — CBC
HCT: 31.4 % — ABNORMAL LOW (ref 39.0–52.0)
Hemoglobin: 10.4 g/dL — ABNORMAL LOW (ref 13.0–17.0)
MCH: 28.3 pg (ref 26.0–34.0)
MCHC: 33.1 g/dL (ref 30.0–36.0)
MCV: 85.3 fL (ref 78.0–100.0)
Platelets: 199 10*3/uL (ref 150–400)
RBC: 3.68 MIL/uL — ABNORMAL LOW (ref 4.22–5.81)
RDW: 13.8 % (ref 11.5–15.5)
WBC: 4.8 10*3/uL (ref 4.0–10.5)

## 2013-08-23 LAB — COMPREHENSIVE METABOLIC PANEL
ALBUMIN: 3.2 g/dL — AB (ref 3.5–5.2)
ALK PHOS: 65 U/L (ref 39–117)
ALT: 9 U/L (ref 0–53)
AST: 14 U/L (ref 0–37)
BUN: 20 mg/dL (ref 6–23)
CO2: 26 mEq/L (ref 19–32)
CREATININE: 1.36 mg/dL — AB (ref 0.50–1.35)
Calcium: 8.6 mg/dL (ref 8.4–10.5)
Chloride: 101 mEq/L (ref 96–112)
GFR calc Af Amer: 59 mL/min — ABNORMAL LOW (ref >90)
GFR calc non Af Amer: 51 mL/min — ABNORMAL LOW (ref >90)
Glucose, Bld: 102 mg/dL — ABNORMAL HIGH (ref 70–99)
POTASSIUM: 4 meq/L (ref 3.7–5.3)
Sodium: 140 mEq/L (ref 137–147)
Total Bilirubin: 0.2 mg/dL — ABNORMAL LOW (ref 0.3–1.2)
Total Protein: 7.1 g/dL (ref 6.0–8.3)

## 2013-08-23 LAB — CK: Total CK: 261 U/L — ABNORMAL HIGH (ref 7–232)

## 2013-08-23 LAB — URIC ACID: URIC ACID, SERUM: 9 mg/dL — AB (ref 4.0–7.8)

## 2013-08-23 LAB — APTT: APTT: 51 s — AB (ref 24–37)

## 2013-08-23 MED ORDER — ALLOPURINOL 300 MG PO TABS
300.0000 mg | ORAL_TABLET | Freq: Every morning | ORAL | Status: DC
  Administered 2013-08-23: 300 mg via ORAL
  Filled 2013-08-23: qty 1

## 2013-08-23 MED ORDER — COLCHICINE 0.6 MG PO TABS
0.6000 mg | ORAL_TABLET | Freq: Every day | ORAL | Status: DC
Start: 2013-08-23 — End: 2013-08-24
  Administered 2013-08-23: 0.6 mg via ORAL
  Filled 2013-08-23 (×2): qty 1

## 2013-08-23 MED ORDER — SODIUM CHLORIDE 0.9 % IJ SOLN
3.0000 mL | Freq: Two times a day (BID) | INTRAMUSCULAR | Status: DC
  Administered 2013-08-23 – 2013-08-24 (×4): 3 mL via INTRAVENOUS

## 2013-08-23 MED ORDER — SIMVASTATIN 10 MG PO TABS
10.0000 mg | ORAL_TABLET | Freq: Every day | ORAL | Status: DC
  Administered 2013-08-23: 10 mg via ORAL
  Filled 2013-08-23 (×2): qty 1

## 2013-08-23 MED ORDER — METHYLPREDNISOLONE SODIUM SUCC 125 MG IJ SOLR
60.0000 mg | Freq: Once | INTRAMUSCULAR | Status: AC
  Administered 2013-08-23: 60 mg via INTRAVENOUS
  Filled 2013-08-23: qty 0.96

## 2013-08-23 MED ORDER — ASPIRIN 325 MG PO TABS
325.0000 mg | ORAL_TABLET | Freq: Every day | ORAL | Status: DC
  Administered 2013-08-23 – 2013-08-24 (×2): 325 mg via ORAL
  Filled 2013-08-23 (×2): qty 1

## 2013-08-23 MED ORDER — ENOXAPARIN SODIUM 60 MG/0.6ML ~~LOC~~ SOLN
55.0000 mg | SUBCUTANEOUS | Status: DC
  Administered 2013-08-23 – 2013-08-24 (×2): 55 mg via SUBCUTANEOUS
  Filled 2013-08-23 (×2): qty 0.6

## 2013-08-23 MED ORDER — ACETAMINOPHEN 325 MG PO TABS: 650.0000 mg | ORAL_TABLET | Freq: Four times a day (QID) | ORAL | Status: DC | PRN

## 2013-08-23 MED ORDER — GABAPENTIN 300 MG PO CAPS
300.0000 mg | ORAL_CAPSULE | Freq: Two times a day (BID) | ORAL | Status: DC
  Administered 2013-08-23 – 2013-08-24 (×4): 300 mg via ORAL
  Filled 2013-08-23 (×5): qty 1

## 2013-08-23 MED ORDER — PREDNISONE 20 MG PO TABS
40.0000 mg | ORAL_TABLET | Freq: Every day | ORAL | Status: DC
  Administered 2013-08-24: 40 mg via ORAL
  Filled 2013-08-23 (×2): qty 2

## 2013-08-23 MED ORDER — OXCARBAZEPINE 150 MG PO TABS
150.0000 mg | ORAL_TABLET | Freq: Two times a day (BID) | ORAL | Status: DC
  Administered 2013-08-23 – 2013-08-24 (×4): 150 mg via ORAL
  Filled 2013-08-23 (×5): qty 1

## 2013-08-23 MED ORDER — OXYCODONE HCL 5 MG PO TABS: 5.0000 mg | ORAL_TABLET | ORAL | Status: DC | PRN

## 2013-08-23 MED ORDER — ACETAMINOPHEN 650 MG RE SUPP: 650.0000 mg | Freq: Four times a day (QID) | RECTAL | Status: DC | PRN

## 2013-08-23 MED ORDER — HYDROCODONE-ACETAMINOPHEN 7.5-325 MG PO TABS
1.0000 | ORAL_TABLET | Freq: Four times a day (QID) | ORAL | Status: DC | PRN
  Administered 2013-08-23: 1 via ORAL
  Filled 2013-08-23: qty 1

## 2013-08-23 MED ORDER — TIZANIDINE HCL 2 MG PO TABS
2.0000 mg | ORAL_TABLET | Freq: Three times a day (TID) | ORAL | Status: DC | PRN
  Filled 2013-08-23: qty 1

## 2013-08-23 MED ORDER — AMLODIPINE BESYLATE 10 MG PO TABS
10.0000 mg | ORAL_TABLET | Freq: Every morning | ORAL | Status: DC
  Administered 2013-08-23 – 2013-08-24 (×2): 10 mg via ORAL
  Filled 2013-08-23 (×2): qty 1

## 2013-08-23 NOTE — ED Notes (Signed)
Report given to 2W RN-- will transfer after pt gets xrays of hip, knee, and foot taken. Dr. Allena KatzPatel at bedside.

## 2013-08-23 NOTE — Progress Notes (Signed)
*  Preliminary Results* Bilateral lower extremity venous duplex completed. Bilateral lower extremities are negative for deep vein thrombosis. There is no evidence of Baker's cyst bilaterally.  08/23/2013  Patra Gherardi, RVT, RDCS, RDMS  

## 2013-08-23 NOTE — H&P (Signed)
[facility] History and Physical  Patient: Luis Poole  ZOX:[id number]  DOB: 1941-02-20  DOS: the patient was seen and examined on 08/23/2013 PCP: [name], MD  Chief Complaint: Left leg pain  HPI: Luis Poole is a 73 y.o. male with Past medical history of hypertension, history of diabetes not on any medication at present, history of stroke status post rescue left-sided weakness, history of lumbar fusion. The patient is coming from home. The patient presented with complaints of left leg swelling associated with pain that has been ongoing since his stroke back in 2012. He mentions that he always has on and off swelling of his left leg but it was worse in last 3 days and the pain that he is also noted was also worsened last 3 days. He denies any fever or redness. He also mentions that since this morning he has been having complaints of dizziness while changing his position. There is an episode of near fall while he was in his kitchen he did not lose consciousness or hit his head. He mentions he fell on his right leg and right side. He also mentions that since last few days his right leg is being locked in and giving out on him when walking. He also mentions he is wearing a knee brace since his last stroke to avoid foot drop and thinks that that is also causing more pain when bending it. He mentions his primary pain is in the left knee and dorsum of his ankle. He also mentions that he has burning pain in the plantar surface of his legs that has been ongoing since last few months He is Not wearing clonidine patch since last 6 months due to no refill.  Review of Systems: as mentioned in the history of present illness.  A Comprehensive review of the other systems is negative.  Past Medical History  Diagnosis Date  . S/P lumbar fusion   . Hypertension   . Diabetes mellitus   . Cataract   . Stroke   . Anemia, unspecified 06/08/2013   Past Surgical History  Procedure Laterality  Date  . Cataract extraction    . Knee surgery    . Spine surgery    . Eye surgery    . Neck surgery    . Tonsilectomy, adenoidectomy, bilateral myringotomy and tubes     Social History:  reports that he has quit smoking. He has quit using smokeless tobacco. He reports that he does not drink alcohol or use illicit drugs. Independent for most of his  ADL.  No Known Allergies  Family History  Problem Relation Age of Onset  . Stroke Mother     Prior to Admission medications   Medication Sig Start Date End Date Taking? Authorizing Provider  allopurinol (ZYLOPRIM) 300 MG tablet Take 300 mg by mouth every morning.   Yes Historical Provider, MD  amLODipine (NORVASC) 10 MG tablet Take 10 mg by mouth every morning.  12/19/11  Yes Historical Provider, MD  aspirin 325 MG tablet Take 325 mg by mouth daily.   Yes Historical Provider, MD  COLCRYS 0.6 MG tablet Take 0.6 mg by mouth daily as needed. gout 12/19/11  Yes Historical Provider, MD  gabapentin (NEURONTIN) 300 MG capsule Take 300 mg by mouth 2 (two) times daily.   Yes Historical Provider, MD  HYDROcodone-acetaminophen (NORCO) 7.5-325 MG per tablet Take 1 tablet by mouth every 6 (six) hours as needed for moderate pain.   Yes Historical Provider, MD  indomethacin (INDOCIN SR) 75 MG CR capsule Take 75 mg by mouth daily with breakfast.    Yes Historical Provider, MD  lisinopril (PRINIVIL,ZESTRIL) 40 MG tablet Take 40 mg by mouth every morning.  12/19/11  Yes Historical Provider, MD  methocarbamol (ROBAXIN) 500 MG tablet Take 0.5-1 tablets (250-500 mg total) by mouth every 6 (six) hours as needed. Muscle spasms 02/08/13  Yes Ranelle OysterZachary T Swartz, MD  OXcarbazepine (TRILEPTAL) 150 MG tablet Take 1 tablet (150 mg total) by mouth 2 (two) times daily. 02/08/13  Yes Ranelle OysterZachary T Swartz, MD  pravastatin (PRAVACHOL) 10 MG tablet Take 10 mg by mouth every evening.  12/17/11  Yes Historical Provider, MD  tiZANidine (ZANAFLEX) 4 MG tablet Take 0.5 tablets (2 mg total) by  mouth every 8 (eight) hours as needed. 02/09/13  Yes Ranelle OysterZachary T Swartz, MD  triamterene-hydrochlorothiazide (MAXZIDE-25) 37.5-25 MG per tablet Take 1 tablet by mouth every morning.  12/19/11  Yes Historical Provider, MD  cloNIDine (CATAPRES - DOSED IN MG/24 HR) 0.3 mg/24hr Place 1 patch onto the skin once a week.  11/08/11   Historical Provider, MD    Physical Exam: Filed Vitals:   08/22/13 2100 08/22/13 2215 08/22/13 2300 08/23/13 0046  BP: 127/84 138/74 137/88 151/93  Pulse: 82 80 74 72  Temp:      TempSrc:      Resp:   13 16  SpO2: 99% 98% 100% 99%    General: Alert, Awake and Oriented to Time, Place and Person. Appear in mild distress Eyes: PERRL ENT: Oral Mucosa clear moist. Neck: no JVD Cardiovascular: S1 and S2 Present, no Murmur, Peripheral Pulses Present Respiratory: Bilateral Air entry equal and Decreased, Clear to Auscultation,  no Crackles,no wheezes Abdomen: Bowel Sound Present, Soft and Non tender Skin: no Rash Extremities: Bilateral Pedal edema, no calf tenderness, pain on his dorsum of the ankle as well as left knee and left hip on movement Neurologic: Grossly Unremarkable.  Labs on Admission:  CBC:  Recent Labs Lab 08/22/13 2300  WBC 6.0  NEUTROABS 2.7  HGB 10.5*  HCT 31.8*  MCV 86.6  PLT 207    CMP     Component Value Date/Time   NA 141 08/22/2013 2300   NA 141 06/08/2013 1101   K 4.0 08/22/2013 2300   K 3.3* 06/08/2013 1101   CL 102 08/22/2013 2300   CO2 26 08/22/2013 2300   CO2 24 06/08/2013 1101   GLUCOSE 106* 08/22/2013 2300   GLUCOSE 122 06/08/2013 1101   BUN 23 08/22/2013 2300   BUN 13.3 06/08/2013 1101   CREATININE 1.54* 08/22/2013 2300   CREATININE 1.3 06/08/2013 1101   CALCIUM 8.9 08/22/2013 2300   CALCIUM 9.3 06/08/2013 1101   PROT 7.5 06/08/2013 1101   PROT 7.9 08/28/2012 1900   ALBUMIN 3.4* 06/08/2013 1101   ALBUMIN 3.8 08/28/2012 1900   AST 15 06/08/2013 1101   AST 17 08/28/2012 1900   ALT 10 06/08/2013 1101   ALT 12 08/28/2012 1900   ALKPHOS  69 06/08/2013 1101   ALKPHOS 70 08/28/2012 1900   BILITOT 0.34 06/08/2013 1101   BILITOT 0.2* 08/28/2012 1900   GFRNONAA 44* 08/22/2013 2300   GFRAA 51* 08/22/2013 2300    No results found for this basename: LIPASE, AMYLASE,  in the last 168 hours No results found for this basename: AMMONIA,  in the last 168 hours   Recent Labs Lab 08/22/13 2300  TROPONINI <0.30   BNP (last 3 results) No results found for this  basename: PROBNP,  in the last 8760 hours  Radiological Exams on Admission: No results found.  EKG: Independently reviewed. normal sinus rhythm, nonspecific ST and T waves changes.  Assessment/Plan Principal Problem:   Left leg swelling Active Problems:   Lumbar post-laminectomy syndrome   Left hemiparesis   CVA (cerebral vascular accident)   Hypertension   Neuropathy   1. Left leg swelling The patient is presenting with complaints of left leg pain. I suspect his pain is more due to sweating and being musculoskeletal. He does not have fever or leukocytosis or redness or warmth examination to suspect any cellulitis. There is also a possibility of possible DVT due to elevated d-dimer therefore the patient has received Lovenox dose in the ED. I will obtain duplex of his legs. I would also obtain an x-ray of his hip knee and ankle for further workup. I check a CPK and if it is elevated more than thousand and I will put him on hydration.  2. Dizziness Etiology currently is unclear The patient does not have any neurological deficit at present on examination any complaints of any dizziness at present. His symptoms were more related to position change therefore possibility of orthostatic hypotension cannot be ruled out. I will check his orthostatic blood pressure.  3. Hypertension He is not using his clonidine patch since last 6 months therefore I would afford it I would continue his rest of antihypertensive medications. I would hold lisinopril for his acute kidney  injury.  4. acute kidney injury Unclear etiology due to his bilateral leg swelling I would hold off on giving him any IV hydration I would also hold off lisinopril and Indocin which would be the primary cause for his kidney injury. Continue to monitor his BMP.  5. possible gout  Due to his complaint of pain on the dorsum of the foot and the first metatarsal and elevated uric acid levels I would treat him with colchicine is already on allopurinol. Although the uric acid level elevation could be secondary to worsening of renal function.  6. Neuropathy The patient is on Neurontin and I would continue him on the same  DVT Prophylaxis: Heparin  Nutrition: Cardiac diet and diabetic diet  Code Status: Full  Disposition: Admitted to inpatient in telemetry unit.  Author: Lynden Oxford, MD [facility] Pager: [phone] 08/23/2013, 12:56 AM    If 7PM-7AM, please contact night-coverage [email or web address] Password [email or web address]

## 2013-08-23 NOTE — Progress Notes (Signed)
[facility] Progress Note  TEAM [address]    WHITMAN MEINHARDT ZOX:[id number] DOB: Aug 12, 1941 DOA: 08/22/2013 PCP: [name], MD  Brief narrative: 73 year old male patient who with some adequate historian. Has a past medical history of hypertension as well as diabetes. Has had 2 CVAs and has required a walking foot brace to the left lower extremity. Patient endorsed increased swelling and pain in the left lower extremity for 3 days. This was not associated with redness or fevers. He had a near mechanical fall while ambulating in his kitchen prior to admission. He felt like his right leg was locking up on him and was having difficulty walking. Apparently he has not been utilizing a clonidine patch for at least 6 months due to no refill. He endorses not starting any new medications. Apparently his primary care provider has decreased or stopped some of his chronic pain medications.  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 -HgbA1c 6.4 in November so no indication for meds and possibly does not carry this dx    Anemia, unspecified -Hgb stable    CKD (chronic kidney disease), stage III -GFR stable    Lumbar post-laminectomy syndrome    Left hemiparesis/History of CVA (cerebrovascular accident) -as above    Chronic diastolic heart failure   DVT prophylaxis: Lovenox Code Status: Full Family  Communication: No family at bedside Disposition Plan/Expected LOS: Consider discontinue telemetry, get venous duplex negative likely can discharge in next 24-48 hours pending PT/OT evaluation Isolation: None Nutritional Status: Stable  Consultants: None  Procedures: Lower extremity venous duplex pending  Antibiotics: None  HPI/Subjective: Patient alert and complaining primarily of exquisite discomfort around left ankle. Also sustained a small skin tear on the dorsum of the left foot during x-ray after admission. Currently denies any chest pain, dyspnea or shortness of breath  Objective: Blood pressure 156/85, pulse 79, temperature 98 F (36.7 C), temperature source Oral, resp. rate 19, height 5\' 5"  (1.651 m), weight 238 lb 6.4 oz (108.138 kg), SpO2 80.00%.  Intake/Output Summary (Last 24 hours) at 08/23/13 1414 Last data filed at 08/23/13 1300  Gross per 24 hour  Intake    240 ml  Output    825 ml  Net   -585 ml     Exam: Followup exam completed. Patient admitted earlier today  Scheduled Meds:  Scheduled Meds: . allopurinol  300 mg Oral q morning - 10a  . amLODipine  10 mg Oral q morning - 10a  . aspirin  325 mg Oral Daily  . colchicine  0.6 mg Oral Daily  . enoxaparin (LOVENOX) injection  55 mg Subcutaneous Q24H  . gabapentin  300 mg Oral BID  . OXcarbazepine  150 mg Oral BID  . [START ON 08/24/2013] predniSONE  40 mg Oral Q breakfast  . simvastatin  10 mg Oral q1800  . sodium chloride  3 mL Intravenous  Q12H   Continuous Infusions:   **Reviewed in detail by the Attending Physician  Data Reviewed: Basic Metabolic Panel:  Recent Labs Lab 08/22/13 2300 08/23/13 0615  NA 141 140  K 4.0 4.0  CL 102 101  CO2 26 26  GLUCOSE 106* 102*  BUN 23 20  CREATININE 1.54* 1.36*  CALCIUM 8.9 8.6   Liver Function Tests:  Recent Labs Lab 08/22/13 2300 08/23/13 0615  AST 17 14  ALT 11 9  ALKPHOS 73 65  BILITOT <0.2* 0.2*  PROT 7.2 7.1  ALBUMIN 3.3* 3.2*   No  results found for this basename: LIPASE, AMYLASE,  in the last 168 hours No results found for this basename: AMMONIA,  in the last 168 hours CBC:  Recent Labs Lab 08/22/13 2300 08/23/13 0615  WBC 6.0 4.8  NEUTROABS 2.7  --   HGB 10.5* 10.4*  HCT 31.8* 31.4*  MCV 86.6 85.3  PLT 207 199   Cardiac Enzymes:  Recent Labs Lab 08/22/13 2300  CKTOTAL 261*  TROPONINI <0.30   BNP (last 3 results) No results found for this basename: PROBNP,  in the last 8760 hours CBG: No results found for this basename: GLUCAP,  in the last 168 hours  No results found for this or any previous visit (from the past 240 hour(s)).   Studies:  Recent x-ray studies have been reviewed in detail by the Attending Physician     Junious SilkAllison Ellis, ANP [facility] Office  [phone] Pager [phone]  **If unable to reach the above provider after paging please contact the Flow Manager @ [phone]  On-Call/Text Page:      [email or web address]      password [email or web address]  If 7PM-7AM, please contact night-coverage [email or web address] Password [email or web address] 08/23/2013, 2:14 PM   LOS: 1 day   I have examined the patient, reviewed the chart and modified the above note which I agree with.   Terrye Dombrosky,MD [phone] 08/23/2013, 12:43 PM

## 2013-08-23 NOTE — Evaluation (Signed)
Physical Therapy Evaluation Patient Details Name: Luis Poole MRN: [id number] DOB: 1940/12/10 Today's Date: 08/23/2013 Time: 4098-1191 PT Time Calculation (min): 31 min  PT Assessment / Plan / Recommendation History of Present Illness  Patient is a 73 yo male with past medical history of hypertension as well as diabetes. Has had 2 CVAs and has required a walking foot brace to the left lower extremity. Patient endorsed increased swelling and pain in the left lower extremity for 3 days. This was not associated with redness or fevers. He had a near mechanical fall while ambulating in his kitchen prior to admission. He felt like his right leg was locking up on him and was having difficulty walking.  Patient with LLE edema/acute gout.  Clinical Impression  Patient presents with problems listed below.  Will benefit from acute PT to maximize independence prior to discharge home.  Patient with pain in Lt foot impacting mobility.  Close to baseline function.  Do not anticipate any f/u PT needed at discharge.    PT Assessment  Patient needs continued PT services    Follow Up Recommendations  No PT follow up;Supervision - Intermittent    Does the patient have the potential to tolerate intense rehabilitation      Barriers to Discharge Decreased caregiver support Patient is at home alone for part of each day    Equipment Recommendations  None recommended by PT    Recommendations for Other Services     Frequency Min 3X/week    Precautions / Restrictions Precautions Precautions: Fall Precaution Comments: recent near-fall at home Required Braces or Orthoses: Other Brace/Splint Other Brace/Splint: LLE AFO (metal uprights in shoe) Restrictions Weight Bearing Restrictions: No   Pertinent Vitals/Pain       Mobility  Bed Mobility Bed Mobility: Rolling Left;Left Sidelying to Sit;Sitting - Scoot to Edge of Bed;Sit to Supine Rolling Left: 4: Min guard;With rail Left Sidelying to Sit: 4: Min  guard;With rails;HOB elevated Sitting - Scoot to Edge of Bed: 4: Min guard;With rail Sit to Supine: 4: Min guard;HOB elevated Details for Bed Mobility Assistance: Verbal cues for technique.  Assist for safety.  Patient able to don brace/shoes independently with increased time. Transfers Transfers: Sit to Stand;Stand to Sit Sit to Stand: 4: Min guard;With upper extremity assist;From bed Stand to Sit: 4: Min guard;To bed Details for Transfer Assistance: Patient used safe hand placement and technique.  Assist for safety. Ambulation/Gait Ambulation/Gait Assistance: 4: Min guard Ambulation Distance (Feet): 180 Feet Assistive device: 4-wheeled walker Ambulation/Gait Assistance Details: Verbal cues for safety.  Note increased extensor tone of LLE during gait. Gait Pattern: Step-to pattern;Decreased stance time - left;Decreased step length - right;Decreased dorsiflexion - left;Left steppage;Trunk flexed Gait velocity: Slow gait speed    Exercises     PT Diagnosis: Abnormality of gait;Difficulty walking;Hemiplegia non-dominant side;Acute pain  PT Problem List: Decreased strength;Decreased balance;Decreased mobility;Impaired tone;Obesity;Pain PT Treatment Interventions: DME instruction;Gait training;Functional mobility training;Patient/family education     PT Goals(Current goals can be found in the care plan section) Acute Rehab PT Goals Patient Stated Goal: To get new shoes for brace PT Goal Formulation: With patient Time For Goal Achievement: 08/30/13 Potential to Achieve Goals: Good  Visit Information  Last PT Received On: 08/23/13 Assistance Needed: +1 History of Present Illness: Patient is a 73 yo male with past medical history of hypertension as well as diabetes. Has had 2 CVAs and has required a walking foot brace to the left lower extremity. Patient endorsed increased swelling and pain in  the left lower extremity for 3 days. This was not associated with redness or fevers. He had a  near mechanical fall while ambulating in his kitchen prior to admission. He felt like his right leg was locking up on him and was having difficulty walking.  Patient with LLE edema/acute gout.       Prior Functioning  Home Living Family/patient expects to be discharged to:: Private residence Living Arrangements: Children Available Help at Discharge: Family;Available PRN/intermittently (son and daughter are in high school and college) Type of Home: House Home Access: Ramped entrance Home Layout: One level Home Equipment: Environmental consultantWalker - 2 wheels;Walker - 4 wheels;Tub bench;Hospital bed Prior Function Level of Independence: Independent with assistive device(s) Communication Communication: No difficulties    Cognition  Cognition Arousal/Alertness: Awake/alert Behavior During Therapy: WFL for tasks assessed/performed Overall Cognitive Status: Within Functional Limits for tasks assessed    Extremity/Trunk Assessment Upper Extremity Assessment Upper Extremity Assessment: LUE deficits/detail;Defer to OT evaluation LUE Deficits / Details: Decreased strength and increased tone Lower Extremity Assessment Lower Extremity Assessment: LLE deficits/detail LLE Deficits / Details: Decreased strength with 4-/5 at hip and knee, and trace DF LLE Coordination: decreased gross motor Cervical / Trunk Assessment Cervical / Trunk Assessment: Normal   Balance    End of Session PT - End of Session Equipment Utilized During Treatment: Gait belt;Other (comment) (Brace LLE) Activity Tolerance: Patient tolerated treatment well Patient left: in bed;with call bell/phone within reach Nurse Communication: Mobility status  GP     Vena AustriaDavis, Glorianne Proctor H 08/23/2013, 8:40 PM Durenda HurtSusan H. Renaldo Fiddleravis, PT, [facility] Acute [facility]hab [facility] Pager [phone]

## 2013-08-24 LAB — CBC
HEMATOCRIT: 32.5 % — AB (ref 39.0–52.0)
Hemoglobin: 10.7 g/dL — ABNORMAL LOW (ref 13.0–17.0)
MCH: 27.8 pg (ref 26.0–34.0)
MCHC: 32.9 g/dL (ref 30.0–36.0)
MCV: 84.4 fL (ref 78.0–100.0)
Platelets: 242 10*3/uL (ref 150–400)
RBC: 3.85 MIL/uL — ABNORMAL LOW (ref 4.22–5.81)
RDW: 13.6 % (ref 11.5–15.5)
WBC: 11.9 10*3/uL — AB (ref 4.0–10.5)

## 2013-08-24 LAB — BASIC METABOLIC PANEL
BUN: 20 mg/dL (ref 6–23)
CHLORIDE: 95 meq/L — AB (ref 96–112)
CO2: 25 mEq/L (ref 19–32)
Calcium: 9 mg/dL (ref 8.4–10.5)
Creatinine, Ser: 1.1 mg/dL (ref 0.50–1.35)
GFR, EST AFRICAN AMERICAN: 77 mL/min — AB (ref >90)
GFR, EST NON AFRICAN AMERICAN: 66 mL/min — AB (ref >90)
Glucose, Bld: 116 mg/dL — ABNORMAL HIGH (ref 70–99)
POTASSIUM: 4.1 meq/L (ref 3.7–5.3)
Sodium: 134 mEq/L — ABNORMAL LOW (ref 137–147)

## 2013-08-24 MED ORDER — PREDNISONE 20 MG PO TABS: 40.0000 mg | ORAL_TABLET | Freq: Every day | ORAL | Status: DC

## 2013-08-24 NOTE — Progress Notes (Signed)
Pt. Got d/c papers,instructions and prescriptions.IV was d/c,tele was d/c.Pt. Ready to go home with his daughter.

## 2013-08-24 NOTE — Progress Notes (Signed)
UR Completed.  [name] Jane [phone] 08/24/2013  

## 2013-08-24 NOTE — Evaluation (Signed)
Occupational Therapy Evaluation Patient Details Name: Luis Poole MRN: [id number] DOB: 12/05/1940 Today's Date: 08/24/2013 Time: [id number] OT Time Calculation (min): 30 min  OT Assessment / Plan / Recommendation History of present illness Patient is a 73 yo male with past medical history of hypertension as well as diabetes. Has had 2 CVAs and has required a walking foot brace to the left lower extremity. Patient endorsed increased swelling and pain in the left lower extremity for 3 days. This was not associated with redness or fevers. He had a near mechanical fall while ambulating in his kitchen prior to admission. He felt like his right leg was locking up on him and was having difficulty walking.  Patient with LLE edema/acute gout.   Clinical Impression   Patient evaluated by Occupational Therapy with no further acute OT needs identified. All education has been completed and the patient has no further questions.  OT is signing off. Thank you for this referral.    OT Assessment  Patient does not need any further OT services    Follow Up Recommendations  No OT follow up    Equipment Recommendations  None recommended by OT    Precautions / Restrictions Precautions Precautions: Fall Precaution Comments: recent near-fall at home Required Braces or Orthoses: Other Brace/Splint Other Brace/Splint: LLE AFO (metal uprights in shoe) Restrictions Weight Bearing Restrictions: No   Pertinent Vitals/Pain Denies pain    ADL  Grooming: Simulated;Modified independent Where Assessed - Grooming: Unsupported standing Upper Body Bathing: Simulated;Set up;Performed Where Assessed - Upper Body Bathing: Unsupported sitting Lower Body Bathing: Simulated;Performed;Set up Where Assessed - Lower Body Bathing: Supported standing;Unsupported sitting Upper Body Dressing: Simulated;Set up;Performed Where Assessed - Upper Body Dressing: Unsupported sitting Lower Body Dressing: Simulated;Performed;Set  up Where Assessed - Lower Body Dressing: Unsupported sitting;Supported standing Toilet Transfer: Simulated;Modified independent Toilet Transfer Method: Stand pivot;Sit to Barist[place]and Toilet Transfer Equipment:  (regular height chair and using armrests) Tub/Shower Transfer Method: Not assessed Equipment Used:  (Rollator) Transfers/Ambulation Related to ADLs: Patient up in his room walking with Rollator upon arrival at a Mod I level.  Reports that he has been getting up to go to the bathroom without assistance. ADL Comments: Patient had just completed sponge bath at EOB prior to OT arrival and was walking around in his room with Rollator.     Visit Information  Last OT Received On: 08/24/13 Assistance Needed: +1 History of Present Illness: Patient is a 73 yo male with past medical history of hypertension as well as diabetes. Has had 2 CVAs and has required a walking foot brace to the left lower extremity. Patient endorsed increased swelling and pain in the left lower extremity for 3 days. This was not associated with redness or fevers. He had a near mechanical fall while ambulating in his kitchen prior to admission. He felt like his right leg was locking up on him and was having difficulty walking.  Patient with LLE edema/acute gout.       Prior Functioning     Home Living Family/patient expects to be discharged to:: Private residence Living Arrangements: Children Available Help at Discharge: Family;Available PRN/intermittently (son in high school and daughter in college and mother of 2 mo old baby) Type of Home: House Home Access: Ramped entrance Home Layout: One level Home Equipment: Environmental consultantWalker - 2 wheels;Walker - 4 wheels;Tub bench;Hospital bed Additional Comments: showers on tub bench in tub/shower combo ~ 1 time/wk with assist from son or daughter Prior Function Level of Independence: Independent with assistive  device(s) Communication Communication: No difficulties Dominant Hand: Right      Cognition  Cognition Arousal/Alertness: Awake/alert Behavior During Therapy: WFL for tasks assessed/performed Overall Cognitive Status: Within Functional Limits for tasks assessed    Extremity/Trunk Assessment Upper Extremity Assessment Upper Extremity Assessment: LUE deficits/detail LUE Deficits / Details: Decreased strength due to h/o CVA, AROM WFL Lower Extremity Assessment Lower Extremity Assessment: Defer to PT evaluation     Mobility Bed Mobility Bed Mobility: Not assessed Transfers Sit to Stand: With upper extremity assist;From bed;6: Modified independent (Device/Increase time);With armrests;From chair/3-in-1 Stand to Sit: To bed;To chair/3-in-1;6: Modified independent (Device/Increase time) Details for Transfer Assistance: Patient used safe hand placement and technique.     End of Session OT - End of Session Equipment Utilized During Treatment:  (Rollator and left AFO w lateral metal uprights) Activity Tolerance: Patient tolerated treatment well Patient left: in chair  GO     Nayelis Bonito 08/24/2013, 10:34 AM

## 2013-08-25 NOTE — ED Provider Notes (Signed)
Medical screening examination/treatment/procedure(s) were conducted as a shared visit with non-physician practitioner(s) and myself.  I personally evaluated the patient during the encounter.  EKG Interpretation    Date/Time:  Sunday August 22 2013 22:30:20 EST Ventricular Rate:  76 PR Interval:  219 QRS Duration: 76 QT Interval:  367 QTC Calculation: 413 R Axis:   37 Text Interpretation:  Sinus rhythm Borderline prolonged PR interval Low voltage, precordial leads Borderline T wave abnormalities No significant change since last tracing Confirmed by Ethelda ChickJACUBOWITZ  MD, Lakeithia Rasor ([id number]) on 08/23/2013 12:16:30 AM             Doug SouSam Elise Gladden, MD 08/25/13 1409

## 2013-08-25 NOTE — Progress Notes (Signed)
OT Note G Code for 08/24/13:  Functional Assessment Tool Used:  Clinical Judgement Functional Limitation: Self Care Self Care Current Status [phone](G8987) at least 1 percent but less than 20 percent impaired, limited or restricted. Self Care Goal Status ([id number]([id number]) at least 1 percent but less than 20 percent impaired, limited or restricted. Self Care Discharge Status [phone]([id number]) at least 1 percent but less than 20 percent impaired, limited or restricted.  Pleas Patriciahristina Azilee Pirro, MS, OTR/L Occupational Therapist Pager [phone]
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
## 2013-11-23 ENCOUNTER — Other Ambulatory Visit: Payer: Self-pay | Admitting: Physical Medicine and Rehabilitation

## 2013-11-23 DIAGNOSIS — M545 Low back pain, unspecified: Secondary | ICD-10-CM

## 2013-11-30 ENCOUNTER — Other Ambulatory Visit: Payer: Medicare Other

## 2013-12-01 ENCOUNTER — Other Ambulatory Visit: Payer: Self-pay | Admitting: Physical Medicine and Rehabilitation

## 2013-12-01 DIAGNOSIS — M545 Low back pain, unspecified: Secondary | ICD-10-CM

## 2013-12-06 ENCOUNTER — Ambulatory Visit
Admission: RE | Admit: 2013-12-06 | Discharge: 2013-12-06 | Disposition: A | Discharge status: Home / Self Care | Payer: Medicare Other | Source: Ambulatory Visit | Attending: Physical Medicine and Rehabilitation | Admitting: Physical Medicine and Rehabilitation

## 2013-12-06 DIAGNOSIS — M545 Low back pain, unspecified: Secondary | ICD-10-CM

## 2014-01-16 ENCOUNTER — Emergency Department (HOSPITAL_COMMUNITY)
Admission: EM | Admit: 2014-01-16 | Discharge: 2014-01-17 | Disposition: A | Discharge status: Home / Self Care | Payer: Medicare Other | Attending: Emergency Medicine | Admitting: Emergency Medicine

## 2014-01-16 ENCOUNTER — Emergency Department (HOSPITAL_COMMUNITY): Payer: Medicare Other

## 2014-01-16 ENCOUNTER — Encounter (HOSPITAL_COMMUNITY): Payer: Self-pay | Admitting: Emergency Medicine

## 2014-01-16 DIAGNOSIS — G8911 Acute pain due to trauma: Secondary | ICD-10-CM | POA: Insufficient documentation

## 2014-01-16 DIAGNOSIS — Z7982 Long term (current) use of aspirin: Secondary | ICD-10-CM | POA: Insufficient documentation

## 2014-01-16 DIAGNOSIS — E119 Type 2 diabetes mellitus without complications: Secondary | ICD-10-CM | POA: Insufficient documentation

## 2014-01-16 DIAGNOSIS — Z8673 Personal history of transient ischemic attack (TIA), and cerebral infarction without residual deficits: Secondary | ICD-10-CM | POA: Insufficient documentation

## 2014-01-16 DIAGNOSIS — Z862 Personal history of diseases of the blood and blood-forming organs and certain disorders involving the immune mechanism: Secondary | ICD-10-CM | POA: Insufficient documentation

## 2014-01-16 DIAGNOSIS — Z8739 Personal history of other diseases of the musculoskeletal system and connective tissue: Secondary | ICD-10-CM | POA: Insufficient documentation

## 2014-01-16 DIAGNOSIS — R0781 Pleurodynia: Secondary | ICD-10-CM

## 2014-01-16 DIAGNOSIS — Z87891 Personal history of nicotine dependence: Secondary | ICD-10-CM | POA: Insufficient documentation

## 2014-01-16 DIAGNOSIS — I10 Essential (primary) hypertension: Secondary | ICD-10-CM | POA: Insufficient documentation

## 2014-01-16 DIAGNOSIS — Z79899 Other long term (current) drug therapy: Secondary | ICD-10-CM | POA: Insufficient documentation

## 2014-01-16 DIAGNOSIS — Z8669 Personal history of other diseases of the nervous system and sense organs: Secondary | ICD-10-CM | POA: Insufficient documentation

## 2014-01-16 DIAGNOSIS — Z791 Long term (current) use of non-steroidal anti-inflammatories (NSAID): Secondary | ICD-10-CM | POA: Insufficient documentation

## 2014-01-16 DIAGNOSIS — R079 Chest pain, unspecified: Secondary | ICD-10-CM | POA: Insufficient documentation

## 2014-01-16 NOTE — ED Notes (Signed)
Pt states that he fell in the beginning of May, he saw his primary doctor and he has on what appears to be a rib belt and was given medication. He continues to complain of left sided pain, he states that he can take a deep breath but continues to have "falling" episodes. Pt has multiple complaints as when continue to talk

## 2014-01-17 MED ORDER — HYDROCODONE-ACETAMINOPHEN 5-325 MG PO TABS
1.0000 | ORAL_TABLET | Freq: Four times a day (QID) | ORAL | Status: DC | PRN
Start: 2014-01-17 — End: 2015-11-30

## 2014-01-17 NOTE — Discharge Instructions (Signed)
Return here as needed.  Followup with your primary care Dr. your x-rays did not show any abnormalities
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
## 2014-01-19 NOTE — ED Provider Notes (Signed)
Medical screening examination/treatment/procedure(s) were performed by non-physician practitioner and as supervising physician I was immediately available for consultation/collaboration.   EKG Interpretation None       Derwood Kaplan, MD 01/19/14 [phone]

## 2014-02-15 ENCOUNTER — Other Ambulatory Visit: Payer: Self-pay | Admitting: Specialist

## 2014-02-15 DIAGNOSIS — IMO0002 Reserved for concepts with insufficient information to code with codable children: Secondary | ICD-10-CM

## 2014-02-27 ENCOUNTER — Ambulatory Visit
Admission: RE | Admit: 2014-02-27 | Discharge: 2014-02-27 | Disposition: A | Discharge status: Home / Self Care | Payer: Medicare Other | Source: Ambulatory Visit | Attending: Specialist | Admitting: Specialist

## 2014-02-27 DIAGNOSIS — IMO0002 Reserved for concepts with insufficient information to code with codable children: Secondary | ICD-10-CM

## 2014-03-18 ENCOUNTER — Ambulatory Visit
Admission: RE | Admit: 2014-03-18 | Discharge: 2014-03-18 | Disposition: A | Discharge status: Home / Self Care | Payer: Medicare Other | Source: Ambulatory Visit | Attending: Physical Medicine and Rehabilitation | Admitting: Physical Medicine and Rehabilitation

## 2014-03-18 ENCOUNTER — Other Ambulatory Visit: Payer: Self-pay | Admitting: Physical Medicine and Rehabilitation

## 2014-03-18 DIAGNOSIS — M961 Postlaminectomy syndrome, not elsewhere classified: Secondary | ICD-10-CM

## 2014-03-18 DIAGNOSIS — G894 Chronic pain syndrome: Secondary | ICD-10-CM

## 2014-08-26 ENCOUNTER — Encounter (HOSPITAL_COMMUNITY): Payer: Self-pay | Admitting: Emergency Medicine

## 2014-08-26 ENCOUNTER — Emergency Department (HOSPITAL_COMMUNITY)
Admission: EM | Admit: 2014-08-26 | Discharge: 2014-08-26 | Disposition: A | Discharge status: Home / Self Care | Payer: Medicare Other | Attending: Emergency Medicine | Admitting: Emergency Medicine

## 2014-08-26 ENCOUNTER — Emergency Department (HOSPITAL_COMMUNITY): Payer: Medicare Other

## 2014-08-26 DIAGNOSIS — G8929 Other chronic pain: Secondary | ICD-10-CM

## 2014-08-26 DIAGNOSIS — M545 Low back pain: Secondary | ICD-10-CM | POA: Diagnosis not present

## 2014-08-26 DIAGNOSIS — Z79899 Other long term (current) drug therapy: Secondary | ICD-10-CM | POA: Insufficient documentation

## 2014-08-26 DIAGNOSIS — Z87891 Personal history of nicotine dependence: Secondary | ICD-10-CM | POA: Insufficient documentation

## 2014-08-26 DIAGNOSIS — W19XXXA Unspecified fall, initial encounter: Secondary | ICD-10-CM

## 2014-08-26 DIAGNOSIS — Z981 Arthrodesis status: Secondary | ICD-10-CM | POA: Insufficient documentation

## 2014-08-26 DIAGNOSIS — S3992XA Unspecified injury of lower back, initial encounter: Secondary | ICD-10-CM | POA: Diagnosis not present

## 2014-08-26 DIAGNOSIS — M549 Dorsalgia, unspecified: Secondary | ICD-10-CM

## 2014-08-26 DIAGNOSIS — Y92000 Kitchen of unspecified non-institutional (private) residence as  the place of occurrence of the external cause: Secondary | ICD-10-CM | POA: Insufficient documentation

## 2014-08-26 DIAGNOSIS — Z7982 Long term (current) use of aspirin: Secondary | ICD-10-CM | POA: Diagnosis not present

## 2014-08-26 DIAGNOSIS — W1839XA Other fall on same level, initial encounter: Secondary | ICD-10-CM | POA: Diagnosis not present

## 2014-08-26 DIAGNOSIS — I10 Essential (primary) hypertension: Secondary | ICD-10-CM | POA: Insufficient documentation

## 2014-08-26 DIAGNOSIS — M5136 Other intervertebral disc degeneration, lumbar region: Secondary | ICD-10-CM | POA: Diagnosis not present

## 2014-08-26 DIAGNOSIS — Z8673 Personal history of transient ischemic attack (TIA), and cerebral infarction without residual deficits: Secondary | ICD-10-CM | POA: Insufficient documentation

## 2014-08-26 DIAGNOSIS — E119 Type 2 diabetes mellitus without complications: Secondary | ICD-10-CM | POA: Insufficient documentation

## 2014-08-26 DIAGNOSIS — D649 Anemia, unspecified: Secondary | ICD-10-CM | POA: Insufficient documentation

## 2014-08-26 DIAGNOSIS — Y9389 Activity, other specified: Secondary | ICD-10-CM | POA: Diagnosis not present

## 2014-08-26 DIAGNOSIS — Z791 Long term (current) use of non-steroidal anti-inflammatories (NSAID): Secondary | ICD-10-CM | POA: Insufficient documentation

## 2014-08-26 DIAGNOSIS — Y998 Other external cause status: Secondary | ICD-10-CM | POA: Diagnosis not present

## 2014-08-26 DIAGNOSIS — M47816 Spondylosis without myelopathy or radiculopathy, lumbar region: Secondary | ICD-10-CM | POA: Diagnosis not present

## 2014-08-26 NOTE — ED Notes (Signed)
Pt c/o low back pain with L leg pain, chronic per patient. Per patient pain has been manageable until yesterday when he got out of the bed and attempted to get shoes on pain now severe, pt has taken pain medication with no relief, Norco 7.5/325 x 1 today.  Pt was driven here by his daughter. Pain medication written by Dr. Otelia SergeantNitka.

## 2014-08-26 NOTE — ED Provider Notes (Signed)
CSN: [id number]     Arrival date & time 08/26/14  1945 History  This chart was scribed for non-physician practitioner, Earley Favor, FNP,working with Donnetta Hutching, MD, by Karle Plumber, ED Scribe. This patient was seen in room WTR6/WTR6 and the patient's care was started at 9:49 PM.  Chief Complaint  Patient presents with  . Back Pain   Patient is a 74 y.o. male presenting with back pain. The history is provided by the patient. No language interpreter was used.  Back Pain Associated symptoms: no fever, no numbness and no weakness     HPI Comments:  Luis Poole is a 74 y.o. male with PMH of chronic back pain who presents to the Emergency Department complaining of severe back pain that became worse this morning. He states he was putting on his shoes this morning and slid off the bed, landing on his buttocks. Pt reports he then slipped in his kitchen this morning and caught himself exacerbating his pain. He reports taking one Norco 7.5mg  last dose this morning but reports he does not like the way pain medication makes him feel. Movement makes the pain worse. Denies alleviating factors. Denies bowel or bladder incontinence, numbness, tingling or weakness of the lower extremities, fever, chills, nausea or vomiting. PMH of HTN, DM, CVA and anemia. Past surgical h/o lumbar fusion, knee surgery, cervical spine surgery and cataract extraction.   Past Medical History  Diagnosis Date  . S/P lumbar fusion   . Hypertension   . Diabetes mellitus   . Cataract   . Stroke   . Anemia, unspecified 06/08/2013   Past Surgical History  Procedure Laterality Date  . Cataract extraction    . Knee surgery    . Spine surgery    . Eye surgery    . Neck surgery    . Tonsilectomy, adenoidectomy, bilateral myringotomy and tubes     Family History  Problem Relation Age of Onset  . Stroke Mother    History  Substance Use Topics  . Smoking status: Former Games developer  . Smokeless tobacco: Former Neurosurgeon  . Alcohol Use:  No    Review of Systems  Constitutional: Negative for fever and chills.  Gastrointestinal: Negative for nausea and vomiting.  Genitourinary:       No bowel or bladder incontinence.  Musculoskeletal: Positive for back pain.  Skin: Negative for wound.  Neurological: Negative for weakness and numbness.  All other systems reviewed and are negative.   Allergies  Review of patient's allergies indicates no known allergies.  Home Medications   Prior to Admission medications   Medication Sig Start Date End Date Taking? Authorizing Provider  allopurinol (ZYLOPRIM) 300 MG tablet Take 300 mg by mouth every morning.   Yes Historical Provider, MD  amLODipine (NORVASC) 10 MG tablet Take 10 mg by mouth every morning.  12/19/11  Yes Historical Provider, MD  aspirin 325 MG tablet Take 325 mg by mouth daily.   Yes Historical Provider, MD  cloNIDine (CATAPRES - DOSED IN MG/24 HR) 0.3 mg/24hr Place 1 patch onto the skin every Sunday.  11/08/11  Yes Historical Provider, MD  colchicine 0.6 MG tablet Take 0.6 mg by mouth daily.   Yes Historical Provider, MD  HYDROcodone-acetaminophen (NORCO) 7.5-325 MG per tablet Take 1 tablet by mouth every 6 (six) hours as needed for moderate pain.   Yes Historical Provider, MD  lisinopril (PRINIVIL,ZESTRIL) 40 MG tablet Take 40 mg by mouth every morning.  12/19/11  Yes Historical Provider, MD  Multiple Vitamins-Minerals (PRESERVISION AREDS PO) Take 1 capsule by mouth 2 (two) times daily.   Yes Historical Provider, MD  pravastatin (PRAVACHOL) 10 MG tablet Take 10 mg by mouth every evening.  12/17/11  Yes Historical Provider, MD  pregabalin (LYRICA) 100 MG capsule Take 100 mg by mouth 3 (three) times daily.   Yes Historical Provider, MD  triamterene-hydrochlorothiazide (MAXZIDE-25) 37.5-25 MG per tablet Take 1 tablet by mouth every morning.  12/19/11  Yes Historical Provider, MD  diclofenac sodium (VOLTAREN) 1 % GEL Apply 2 g topically 2 (two) times daily.    Historical Provider,  MD  gabapentin (NEURONTIN) 300 MG capsule Take 300 mg by mouth 2 (two) times daily.    Historical Provider, MD  HYDROcodone-acetaminophen (NORCO/VICODIN) 5-325 MG per tablet Take 1 tablet by mouth every 6 (six) hours as needed for moderate pain. 01/17/14   Jamesetta Orleanshristopher W Lawyer, PA-C  OXcarbazepine (TRILEPTAL) 150 MG tablet Take 1 tablet (150 mg total) by mouth 2 (two) times daily. 02/08/13   Ranelle OysterZachary T Swartz, MD  PRESCRIPTION MEDICATION Apply 1 application topically 2 (two) times daily as needed (pain). Pt has another cream. Pt does not know the name. Dr.Shawn Dalton-Bethea [phone]. Pt gets med mailed to him. Pt takes care of his meds    Historical Provider, MD   Triage Vitals: BP 137/86 mmHg  Pulse 77  Temp(Src) 98.4 F (36.9 C) (Oral)  Resp 18  Ht 5\' 7"  (1.702 m)  Wt 231 lb (104.781 kg)  BMI 36.17 kg/m2  SpO2 100% Physical Exam  Constitutional: He is oriented to person, place, and time. He appears well-developed and well-nourished.  HENT:  Head: Normocephalic and atraumatic.  Eyes: EOM are normal.  Neck: Normal range of motion.  Cardiovascular: Normal rate.   Pulmonary/Chest: Effort normal.  Musculoskeletal: Normal range of motion.  Neurological: He is alert and oriented to person, place, and time.  Skin: Skin is warm and dry.  Psychiatric: He has a normal mood and affect. His behavior is normal.  Nursing note and vitals reviewed.   ED Course  Procedures (including critical care time) DIAGNOSTIC STUDIES: Oxygen Saturation is 100% on RA, normal by my interpretation.   COORDINATION OF CARE: 9:56 PM- Will order X-Ray of lumbar spine. Pt verbalizes understanding and agrees to plan.  Medications - No data to display  Labs Review Labs Reviewed - No data to display  Imaging Review Dg Lumbar Spine Complete  08/26/2014   CLINICAL DATA:  74 year old male with chronic back pain complaining of severe back pain that acutely worsened this morning.  EXAM: LUMBAR SPINE - COMPLETE 4+  VIEW  COMPARISON:  MRI of lumbar spine 02/27/2014.  FINDINGS: Five views of the lumbar spine again demonstrate postoperative changes of PLIF at L5-S1. 6 mm of anterolisthesis of L5 upon S1 is unchanged compared to prior examinations. Interbody graft at the L5-S1 interspace. No acute displaced fracture or definite compression type fracture in the lumbar spine. Alignment is otherwise anatomic. Mild multilevel degenerative disc disease, and multilevel facet arthropathy.  IMPRESSION: 1. No acute abnormality of the thoracic spine. 2. Chronic postoperative and degenerative changes, as above.   Electronically Signed   By: Trudie Reedaniel  Entrikin M.D.   On: 08/26/2014 22:36     EKG Interpretation None      MDM   Final diagnoses:  Fall  Exacerbation of chronic back pain       I personally performed the services described in this documentation, which was scribed in my presence. The  recorded information has been reviewed and is accurate.    Arman Filter, NP 08/26/14 2254  Donnetta Hutching, MD 08/27/14 1049

## 2014-08-26 NOTE — Discharge Instructions (Signed)
Your x-ray does not show any changes in your previous back surgery nor any reason or an acute exacerbation of your chronic pain.  Please take your medication as directed.  Follow-up with your orthopedist or your primary care physician for any other problem

## 2014-08-26 NOTE — ED Notes (Signed)
Bed: WTR6 Expected date:  Expected time:  Means of arrival:  Comments: 

## 2014-08-31 DIAGNOSIS — Z0000 Encounter for general adult medical examination without abnormal findings: Secondary | ICD-10-CM | POA: Diagnosis not present

## 2014-09-20 DIAGNOSIS — E0821 Diabetes mellitus due to underlying condition with diabetic nephropathy: Secondary | ICD-10-CM | POA: Diagnosis not present

## 2014-09-20 DIAGNOSIS — I69339 Monoplegia of upper limb following cerebral infarction affecting unspecified side: Secondary | ICD-10-CM | POA: Diagnosis not present

## 2014-09-20 DIAGNOSIS — E0800 Diabetes mellitus due to underlying condition with hyperosmolarity without nonketotic hyperglycemic-hyperosmolar coma (NKHHC): Secondary | ICD-10-CM | POA: Diagnosis not present

## 2014-09-20 DIAGNOSIS — D510 Vitamin B12 deficiency anemia due to intrinsic factor deficiency: Secondary | ICD-10-CM | POA: Diagnosis not present

## 2014-09-20 DIAGNOSIS — I10 Essential (primary) hypertension: Secondary | ICD-10-CM | POA: Diagnosis not present

## 2014-09-20 DIAGNOSIS — D649 Anemia, unspecified: Secondary | ICD-10-CM | POA: Diagnosis not present

## 2014-10-21 DIAGNOSIS — D649 Anemia, unspecified: Secondary | ICD-10-CM | POA: Diagnosis not present

## 2014-10-21 DIAGNOSIS — E0800 Diabetes mellitus due to underlying condition with hyperosmolarity without nonketotic hyperglycemic-hyperosmolar coma (NKHHC): Secondary | ICD-10-CM | POA: Diagnosis not present

## 2014-10-21 DIAGNOSIS — I10 Essential (primary) hypertension: Secondary | ICD-10-CM | POA: Diagnosis not present

## 2014-12-13 ENCOUNTER — Emergency Department (HOSPITAL_COMMUNITY): Payer: Medicare Other

## 2014-12-13 ENCOUNTER — Emergency Department (HOSPITAL_COMMUNITY)
Admission: EM | Admit: 2014-12-13 | Discharge: 2014-12-14 | Disposition: A | Discharge status: Home / Self Care | Payer: Medicare Other | Attending: Emergency Medicine | Admitting: Emergency Medicine

## 2014-12-13 ENCOUNTER — Encounter (HOSPITAL_COMMUNITY): Payer: Self-pay

## 2014-12-13 DIAGNOSIS — I10 Essential (primary) hypertension: Secondary | ICD-10-CM | POA: Diagnosis not present

## 2014-12-13 DIAGNOSIS — E119 Type 2 diabetes mellitus without complications: Secondary | ICD-10-CM | POA: Insufficient documentation

## 2014-12-13 DIAGNOSIS — M25562 Pain in left knee: Secondary | ICD-10-CM

## 2014-12-13 DIAGNOSIS — M25572 Pain in left ankle and joints of left foot: Secondary | ICD-10-CM | POA: Diagnosis not present

## 2014-12-13 DIAGNOSIS — S93602A Unspecified sprain of left foot, initial encounter: Secondary | ICD-10-CM | POA: Diagnosis not present

## 2014-12-13 DIAGNOSIS — Y9289 Other specified places as the place of occurrence of the external cause: Secondary | ICD-10-CM | POA: Diagnosis not present

## 2014-12-13 DIAGNOSIS — M25579 Pain in unspecified ankle and joints of unspecified foot: Secondary | ICD-10-CM

## 2014-12-13 DIAGNOSIS — Z87891 Personal history of nicotine dependence: Secondary | ICD-10-CM | POA: Diagnosis not present

## 2014-12-13 DIAGNOSIS — W010XXA Fall on same level from slipping, tripping and stumbling without subsequent striking against object, initial encounter: Secondary | ICD-10-CM | POA: Diagnosis not present

## 2014-12-13 DIAGNOSIS — S99912A Unspecified injury of left ankle, initial encounter: Secondary | ICD-10-CM | POA: Diagnosis not present

## 2014-12-13 DIAGNOSIS — S8992XA Unspecified injury of left lower leg, initial encounter: Secondary | ICD-10-CM | POA: Insufficient documentation

## 2014-12-13 DIAGNOSIS — S99922A Unspecified injury of left foot, initial encounter: Secondary | ICD-10-CM | POA: Diagnosis not present

## 2014-12-13 DIAGNOSIS — Z7982 Long term (current) use of aspirin: Secondary | ICD-10-CM | POA: Insufficient documentation

## 2014-12-13 DIAGNOSIS — Z981 Arthrodesis status: Secondary | ICD-10-CM | POA: Insufficient documentation

## 2014-12-13 DIAGNOSIS — Y998 Other external cause status: Secondary | ICD-10-CM | POA: Insufficient documentation

## 2014-12-13 DIAGNOSIS — Z9849 Cataract extraction status, unspecified eye: Secondary | ICD-10-CM | POA: Diagnosis not present

## 2014-12-13 DIAGNOSIS — M79672 Pain in left foot: Secondary | ICD-10-CM | POA: Diagnosis not present

## 2014-12-13 DIAGNOSIS — Z8673 Personal history of transient ischemic attack (TIA), and cerebral infarction without residual deficits: Secondary | ICD-10-CM | POA: Diagnosis not present

## 2014-12-13 DIAGNOSIS — D649 Anemia, unspecified: Secondary | ICD-10-CM | POA: Diagnosis not present

## 2014-12-13 DIAGNOSIS — Y9389 Activity, other specified: Secondary | ICD-10-CM | POA: Diagnosis not present

## 2014-12-13 DIAGNOSIS — Z79899 Other long term (current) drug therapy: Secondary | ICD-10-CM | POA: Insufficient documentation

## 2014-12-13 NOTE — ED Notes (Addendum)
Patient reports he had two episodes today where he felt like his left leg "gave out" and he fell.  Complains of pain to left knee, left foot/ankle, and back.  Questionable syncope.  Denies dizziness at present time.

## 2014-12-14 MED ORDER — TRAMADOL HCL 50 MG PO TABS
50.0000 mg | ORAL_TABLET | Freq: Once | ORAL | Status: AC
  Administered 2014-12-14: 50 mg via ORAL
  Filled 2014-12-14: qty 1

## 2014-12-14 MED ORDER — TRAMADOL HCL 50 MG PO TABS: 50.0000 mg | ORAL_TABLET | Freq: Four times a day (QID) | ORAL | Status: DC | PRN

## 2014-12-14 NOTE — Discharge Instructions (Signed)
It was our pleasure to provide your ER care today - we hope that you feel better.  Be very careful when up and walking - fall precautions.   Your xrays were noted as showing some swelling/effusion about your left knee, and swelling left foot, but no acute fracture or break was seen.   Follow up with your orthopedist in the next few days - call office in AM to arrange follow up with them.   You may take ultram as need for pain - no driving when taking.  Also follow up with your primary care doctor in coming week.  Return to ER if worse, new symptoms, fevers, extremity numbness/weakness, worsening or severe pain, weak/fainting, other concern.  You were given pain medication in the ER - no driving for the next 4 hours.     Knee Sprain A knee sprain is a tear in one of the strong, fibrous tissues that connect the bones (ligaments) in your knee. The severity of the sprain depends on how much of the ligament is torn. The tear can be either partial or complete. CAUSES  Often, sprains are a result of a fall or injury. The force of the impact causes the fibers of your ligament to stretch too much. This excess tension causes the fibers of your ligament to tear. SIGNS AND SYMPTOMS  You may have some loss of motion in your knee. Other symptoms include:  Bruising.  Pain in the knee area.  Tenderness of the knee to the touch.  Swelling. DIAGNOSIS  To diagnose a knee sprain, your health care provider will physically examine your knee. Your health care provider may also suggest an X-ray exam of your knee to make sure no bones are broken. TREATMENT  If your ligament is only partially torn, treatment usually involves keeping the knee in a fixed position (immobilization) or bracing your knee for activities that require movement for several weeks. To do this, your health care provider will apply a bandage, cast, or splint to keep your knee from moving and to support your knee during movement until it  heals. For a partially torn ligament, the healing process usually takes 4-6 weeks. If your ligament is completely torn, depending on which ligament it is, you may need surgery to reconnect the ligament to the bone or reconstruct it. After surgery, a cast or splint may be applied and will need to stay on your knee for 4-6 weeks while your ligament heals. HOME CARE INSTRUCTIONS  Keep your injured knee elevated to decrease swelling.  To ease pain and swelling, apply ice to the injured area:  Put ice in a plastic bag.  Place a towel between your skin and the bag.  Leave the ice on for 20 minutes, 2-3 times a day.  Only take medicine for pain as directed by your health care provider.  Do not leave your knee unprotected until pain and stiffness go away (usually 4-6 weeks).  If you have a cast or splint, do not allow it to get wet. If you have been instructed not to remove it, cover it with a plastic bag when you shower or bathe. Do not swim.  Your health care provider may suggest exercises for you to do during your recovery to prevent or limit permanent weakness and stiffness. SEEK IMMEDIATE MEDICAL CARE IF:  Your cast or splint becomes damaged.  Your pain becomes worse.  You have significant pain, swelling, or numbness below the cast or splint. MAKE SURE YOU:  Understand  these instructions.  Will watch your condition.  Will get help right away if you are not doing well or get worse. Document Released: 08/05/2005 Document Revised: 05/26/2013 Document Reviewed: 03/17/2013 [facility] Patient Information 2015 [place], [place]. This information is not intended to replace advice given to you by your health care provider. Make sure you discuss any questions you have with your health care provider.    Foot Sprain The muscles and cord like structures which attach muscle to bone (tendons) that surround the feet are made up of units. A foot sprain can occur at the weakest spot in any of these  units. This condition is most often caused by injury to or overuse of the foot, as from playing contact sports, or aggravating a previous injury, or from poor conditioning, or obesity. SYMPTOMS  Pain with movement of the foot.  Tenderness and swelling at the injury site.  Loss of strength is present in moderate or severe sprains. THE THREE GRADES OR SEVERITY OF FOOT SPRAIN ARE:  Mild (Grade I): Slightly pulled muscle without tearing of muscle or tendon fibers or loss of strength.  Moderate (Grade II): Tearing of fibers in a muscle, tendon, or at the attachment to bone, with small decrease in strength.  Severe (Grade III): Rupture of the muscle-tendon-bone attachment, with separation of fibers. Severe sprain requires surgical repair. Often repeating (chronic) sprains are caused by overuse. Sudden (acute) sprains are caused by direct injury or over-use. DIAGNOSIS  Diagnosis of this condition is usually by your own observation. If problems continue, a caregiver may be required for further evaluation and treatment. X-rays may be required to make sure there are not breaks in the bones (fractures) present. Continued problems may require physical therapy for treatment. PREVENTION  Use strength and conditioning exercises appropriate for your sport.  Warm up properly prior to working out.  Use athletic shoes that are made for the sport you are participating in.  Allow adequate time for healing. Early return to activities makes repeat injury more likely, and can lead to an unstable arthritic foot that can result in prolonged disability. Mild sprains generally heal in 3 to 10 days, with moderate and severe sprains taking 2 to 10 weeks. Your caregiver can help you determine the proper time required for healing. HOME CARE INSTRUCTIONS   Apply ice to the injury for 15-20 minutes, 03-04 times per day. Put the ice in a plastic bag and place a towel between the bag of ice and your skin.  An elastic wrap  (like an Ace bandage) may be used to keep swelling down.  Keep foot above the level of the heart, or at least raised on a footstool, when swelling and pain are present.  Try to avoid use other than gentle range of motion while the foot is painful. Do not resume use until instructed by your caregiver. Then begin use gradually, not increasing use to the point of pain. If pain does develop, decrease use and continue the above measures, gradually increasing activities that do not cause discomfort, until you gradually achieve normal use.  Use crutches if and as instructed, and for the length of time instructed.  Keep injured foot and ankle wrapped between treatments.  Massage foot and ankle for comfort and to keep swelling down. Massage from the toes up towards the knee.  Only take over-the-counter or prescription medicines for pain, discomfort, or fever as directed by your caregiver. SEEK IMMEDIATE MEDICAL CARE IF:   Your pain and swelling increase, or  pain is not controlled with medications.  You have loss of feeling in your foot or your foot turns cold or blue.  You develop new, unexplained symptoms, or an increase of the symptoms that brought you to your caregiver. MAKE SURE YOU:   Understand these instructions.  Will watch your condition.  Will get help right away if you are not doing well or get worse. Document Released: 01/25/2002 Document Revised: 10/28/2011 Document Reviewed: 03/24/2008 [facility] Patient Information 2015 [place], [place]. This information is not intended to replace advice given to you by your health care provider. Make sure you discuss any questions you have with your health care provider.    Cryotherapy Cryotherapy means treatment with cold. Ice or gel packs can be used to reduce both pain and swelling. Ice is the most helpful within the first 24 to 48 hours after an injury or flare-up from overusing a muscle or joint. Sprains, strains, spasms, burning pain,  shooting pain, and aches can all be eased with ice. Ice can also be used when recovering from surgery. Ice is effective, has very few side effects, and is safe for most people to use. PRECAUTIONS  Ice is not a safe treatment option for people with:  Raynaud phenomenon. This is a condition affecting small blood vessels in the extremities. Exposure to cold may cause your problems to return.  Cold hypersensitivity. There are many forms of cold hypersensitivity, including:  Cold urticaria. Red, itchy hives appear on the skin when the tissues begin to warm after being iced.  Cold erythema. This is a red, itchy rash caused by exposure to cold.  Cold hemoglobinuria. Red blood cells break down when the tissues begin to warm after being iced. The hemoglobin that carry oxygen are passed into the urine because they cannot combine with blood proteins fast enough.  Numbness or altered sensitivity in the area being iced. If you have any of the following conditions, do not use ice until you have discussed cryotherapy with your caregiver:  Heart conditions, such as arrhythmia, angina, or chronic heart disease.  High blood pressure.  Healing wounds or open skin in the area being iced.  Current infections.  Rheumatoid arthritis.  Poor circulation.  Diabetes. Ice slows the blood flow in the region it is applied. This is beneficial when trying to stop inflamed tissues from spreading irritating chemicals to surrounding tissues. However, if you expose your skin to cold temperatures for too long or without the proper protection, you can damage your skin or nerves. Watch for signs of skin damage due to cold. HOME CARE INSTRUCTIONS Follow these tips to use ice and cold packs safely.  Place a dry or damp towel between the ice and skin. A damp towel will cool the skin more quickly, so you may need to shorten the time that the ice is used.  For a more rapid response, add gentle compression to the ice.  Ice  for no more than 10 to 20 minutes at a time. The bonier the area you are icing, the less time it will take to get the benefits of ice.  Check your skin after 5 minutes to make sure there are no signs of a poor response to cold or skin damage.  Rest 20 minutes or more between uses.  Once your skin is numb, you can end your treatment. You can test numbness by very lightly touching your skin. The touch should be so light that you do not see the skin dimple from the  pressure of your fingertip. When using ice, most people will feel these normal sensations in this order: cold, burning, aching, and numbness.  Do not use ice on someone who cannot communicate their responses to pain, such as small children or people with dementia. HOW TO MAKE AN ICE PACK Ice packs are the most common way to use ice therapy. Other methods include ice massage, ice baths, and cryosprays. Muscle creams that cause a cold, tingly feeling do not offer the same benefits that ice offers and should not be used as a substitute unless recommended by your caregiver. To make an ice pack, do one of the following:  Place crushed ice or a bag of frozen vegetables in a sealable plastic bag. Squeeze out the excess air. Place this bag inside another plastic bag. Slide the bag into a pillowcase or place a damp towel between your skin and the bag.  Mix 3 parts water with 1 part rubbing alcohol. Freeze the mixture in a sealable plastic bag. When you remove the mixture from the freezer, it will be slushy. Squeeze out the excess air. Place this bag inside another plastic bag. Slide the bag into a pillowcase or place a damp towel between your skin and the bag. SEEK MEDICAL CARE IF:  You develop white spots on your skin. This may give the skin a blotchy (mottled) appearance.  Your skin turns blue or pale.  Your skin becomes waxy or hard.  Your swelling gets worse. MAKE SURE YOU:   Understand these instructions.  Will watch your  condition.  Will get help right away if you are not doing well or get worse. Document Released: 04/01/2011 Document Revised: 12/20/2013 Document Reviewed: 04/01/2011 [facility] Patient Information 2015 [place], [place]. This information is not intended to replace advice given to you by your health care provider. Make sure you discuss any questions you have with your health care provider.    Fall Prevention and Home Safety Falls cause injuries and can affect all age groups. It is possible to use preventive measures to significantly decrease the likelihood of falls. There are many simple measures which can make your home safer and prevent falls. OUTDOORS  Repair cracks and edges of walkways and driveways.  Remove high doorway thresholds.  Trim shrubbery on the main path into your home.  Have good outside lighting.  Clear walkways of tools, rocks, debris, and clutter.  Check that handrails are not broken and are securely fastened. Both sides of steps should have handrails.  Have leaves, snow, and ice cleared regularly.  Use sand or salt on walkways during winter months.  In the garage, clean up grease or oil spills. BATHROOM  Install night lights.  Install grab bars by the toilet and in the tub and shower.  Use non-skid mats or decals in the tub or shower.  Place a plastic non-slip stool in the shower to sit on, if needed.  Keep floors dry and clean up all water on the floor immediately.  Remove soap buildup in the tub or shower on a regular basis.  Secure bath mats with non-slip, double-sided rug tape.  Remove throw rugs and tripping hazards from the floors. BEDROOMS  Install night lights.  Make sure a bedside light is easy to reach.  Do not use oversized bedding.  Keep a telephone by your bedside.  Have a firm chair with side arms to use for getting dressed.  Remove throw rugs and tripping hazards from the floor. KITCHEN  Keep handles on  pots and pans turned  toward the center of the stove. Use back burners when possible.  Clean up spills quickly and allow time for drying.  Avoid walking on wet floors.  Avoid hot utensils and knives.  Position shelves so they are not too high or low.  Place commonly used objects within easy reach.  If necessary, use a sturdy step stool with a grab bar when reaching.  Keep electrical cables out of the way.  Do not use floor polish or wax that makes floors slippery. If you must use wax, use non-skid floor wax.  Remove throw rugs and tripping hazards from the floor. STAIRWAYS  Never leave objects on stairs.  Place handrails on both sides of stairways and use them. Fix any loose handrails. Make sure handrails on both sides of the stairways are as long as the stairs.  Check carpeting to make sure it is firmly attached along stairs. Make repairs to worn or loose carpet promptly.  Avoid placing throw rugs at the top or bottom of stairways, or properly secure the rug with carpet tape to prevent slippage. Get rid of throw rugs, if possible.  Have an electrician put in a light switch at the top and bottom of the stairs. OTHER FALL PREVENTION TIPS  Wear low-heel or rubber-soled shoes that are supportive and fit well. Wear closed toe shoes.  When using a stepladder, make sure it is fully opened and both spreaders are firmly locked. Do not climb a closed stepladder.  Add color or contrast paint or tape to grab bars and handrails in your home. Place contrasting color strips on first and last steps.  Learn and use mobility aids as needed. Install an electrical emergency response system.  Turn on lights to avoid dark areas. Replace light bulbs that burn out immediately. Get light switches that glow.  Arrange furniture to create clear pathways. Keep furniture in the same place.  Firmly attach carpet with non-skid or double-sided tape.  Eliminate uneven floor surfaces.  Select a carpet pattern that does not  visually hide the edge of steps.  Be aware of all pets. OTHER HOME SAFETY TIPS  Set the water temperature for 120 F (48.8 C).  Keep emergency numbers on or near the telephone.  Keep smoke detectors on every level of the home and near sleeping areas. Document Released: 07/26/2002 Document Revised: 02/04/2012 Document Reviewed: 10/25/2011 [facility] Patient Information 2015 [place]. This information is not intended to replace advice given to you by your health care provider. Make sure you discuss any questions you have with your health care provider.

## 2014-12-14 NOTE — ED Provider Notes (Signed)
CSN: [id number]     Arrival date & time 12/13/14  2036 History   First MD Initiated Contact with Patient 12/14/14 0101     Chief Complaint  Patient presents with  . Leg Injury     (Consider location/radiation/quality/duration/timing/severity/associated sxs/prior Treatment) The history is provided by the patient.  Patient c/o left foot pain s/p fall today. States occasionally his knee will buckle, causing him to fall. Walks w rolling walker at baseline, and has left foot orthotic device. Foot pain constant, dull, moderate, worse w palpation. No skin lesions, wounds, or redness. Denies head injury. No loc. No headache. No neck or back pain. Pt denies other pain or injury. No meds for pain pta. Left knee prior tka.      Past Medical History  Diagnosis Date  . S/P lumbar fusion   . Hypertension   . Diabetes mellitus   . Cataract   . Stroke   . Anemia, unspecified 06/08/2013   Past Surgical History  Procedure Laterality Date  . Cataract extraction    . Knee surgery    . Spine surgery    . Eye surgery    . Neck surgery    . Tonsilectomy, adenoidectomy, bilateral myringotomy and tubes     Family History  Problem Relation Age of Onset  . Stroke Mother    History  Substance Use Topics  . Smoking status: Former Games developer  . Smokeless tobacco: Former Neurosurgeon  . Alcohol Use: No    Review of Systems  Constitutional: Negative for fever and chills.  HENT: Negative for sore throat.   Eyes: Negative for redness.  Respiratory: Negative for shortness of breath.   Cardiovascular: Negative for chest pain.  Gastrointestinal: Negative for vomiting and abdominal pain.  Genitourinary: Negative for flank pain.  Musculoskeletal: Negative for back pain and neck pain.  Skin: Negative for wound.  Neurological: Negative for weakness, numbness and headaches.  Hematological: Does not bruise/bleed easily.  Psychiatric/Behavioral: Negative for confusion.      Allergies  Review of patient's  allergies indicates no known allergies.  Home Medications   Prior to Admission medications   Medication Sig Start Date End Date Taking? Authorizing Provider  allopurinol (ZYLOPRIM) 300 MG tablet Take 300 mg by mouth every morning.   Yes Historical Provider, MD  amLODipine (NORVASC) 10 MG tablet Take 10 mg by mouth every morning.  12/19/11  Yes Historical Provider, MD  aspirin 325 MG tablet Take 325 mg by mouth daily.   Yes Historical Provider, MD  cloNIDine (CATAPRES - DOSED IN MG/24 HR) 0.3 mg/24hr Place 1 patch onto the skin every Sunday.  11/08/11  Yes Historical Provider, MD  colchicine 0.6 MG tablet Take 0.6 mg by mouth daily.   Yes Historical Provider, MD  gabapentin (NEURONTIN) 300 MG capsule Take 300 mg by mouth 2 (two) times daily.   Yes Historical Provider, MD  HYDROcodone-acetaminophen (NORCO/VICODIN) 5-325 MG per tablet Take 1 tablet by mouth every 6 (six) hours as needed for moderate pain. Patient not taking: Reported on 12/14/2014 01/17/14   Charlestine Night, PA-C  Iron-FA-B Cmp-C-Biot-Probiotic (FUSION PLUS PO) Take 1 capsule by mouth daily.   Yes Historical Provider, MD  lisinopril (PRINIVIL,ZESTRIL) 40 MG tablet Take 40 mg by mouth every morning.  12/19/11  Yes Historical Provider, MD  Multiple Vitamins-Minerals (PRESERVISION AREDS PO) Take 1 capsule by mouth 2 (two) times daily.   Yes Historical Provider, MD  omega-3 acid ethyl esters (LOVAZA) 1 G capsule Take 1 g by mouth daily.  Yes Historical Provider, MD  OXcarbazepine (TRILEPTAL) 150 MG tablet Take 1 tablet (150 mg total) by mouth 2 (two) times daily. 02/08/13  Yes Ranelle OysterZachary T Swartz, MD  pravastatin (PRAVACHOL) 10 MG tablet Take 10 mg by mouth every evening.  12/17/11  Yes Historical Provider, MD  pregabalin (LYRICA) 100 MG capsule Take 100 mg by mouth 3 (three) times daily.   Yes Historical Provider, MD  PRESCRIPTION MEDICATION Apply 1 application topically 3 (three) times daily as needed (arthritis). Compound medication for  arthritis   Yes Historical Provider, MD  tiZANidine (ZANAFLEX) 4 MG tablet Take 2 mg by mouth every 8 (eight) hours as needed for muscle spasms.   Yes Historical Provider, MD  triamterene-hydrochlorothiazide (MAXZIDE-25) 37.5-25 MG per tablet Take 1 tablet by mouth every morning.  12/19/11  Yes Historical Provider, MD   BP 147/96 mmHg  Pulse 70  Temp(Src) 97.9 F (36.6 C) (Oral)  Resp 18  Ht 5\' 8"  (1.727 m)  Wt 231 lb (104.781 kg)  BMI 35.13 kg/m2  SpO2 100% Physical Exam  Constitutional: He is oriented to person, place, and time. He appears well-developed and well-nourished. No distress.  HENT:  Head: Atraumatic.  Mouth/Throat: Oropharynx is clear and moist.  Eyes: Conjunctivae are normal. Pupils are equal, round, and reactive to light. No scleral icterus.  Neck: Neck supple. No tracheal deviation present.  Cardiovascular: Normal rate, regular rhythm, normal heart sounds and intact distal pulses.   Pulmonary/Chest: Effort normal and breath sounds normal. No accessory muscle usage. No respiratory distress.  Abdominal: Soft. Bowel sounds are normal. He exhibits no distension. There is no tenderness.  Musculoskeletal: Normal range of motion.  CTLS spine, non tender, aligned, no step off. Tenderness left mid foot, no obvious deformity. Skin intact, normal appearing. Distal pulses palp. Left knee and ankle stable, w good rom without pain. No other focal ext pain or tenderness on bil ext exam.   Neurological: He is alert and oriented to person, place, and time.  Ambulates w steady gait.   Skin: Skin is warm and dry. He is not diaphoretic.  Psychiatric: He has a normal mood and affect.  Nursing note and vitals reviewed.   ED Course  Procedures (including critical care time) Labs Review Labs Reviewed - No data to display  Imaging Review Dg Ankle Complete Left  12/13/2014   CLINICAL DATA:  Status post two falls. Left ankle pain. Initial encounter.  EXAM: LEFT ANKLE COMPLETE - 3+ VIEW   COMPARISON:  Left ankle radiographs performed 10/16/2004  FINDINGS: There is no evidence of fracture or dislocation. There appears to be mild new chronic flattening of the talar dome. Mild chronic cortical irregularity is noted about the ankle, reflecting remote injury. The ankle mortise is intact; the interosseous space is within normal limits. No talar tilt or subluxation is seen. A plantar calcaneal spur is incidentally noted.  The joint spaces are preserved. Mild diffuse soft tissue swelling is noted about the ankle.  IMPRESSION: No evidence of acute fracture or dislocation. Apparent mild new chronic flattening of the talar dome, reflecting degenerative change.   Electronically Signed   By: Roanna RaiderJeffery  Chang M.D.   On: 12/13/2014 22:24   Dg Knee Complete 4 Views Left  12/13/2014   CLINICAL DATA:  Left leg gave out twice today, with fall. Left knee pain. Initial encounter.  EXAM: LEFT KNEE - COMPLETE 4+ VIEW  COMPARISON:  Left knee radiographs performed 08/23/2013  FINDINGS: There is no evidence of fracture or dislocation. The  patient's total knee arthroplasty is grossly unremarkable in appearance, without evidence of loosening. The patellar component is grossly unremarkable, though not well assessed on radiograph.  A moderate knee joint effusion is noted. The visualized soft tissues are otherwise unremarkable in appearance.  IMPRESSION: 1. No evidence of fracture or dislocation. 2. Total knee arthroplasty is grossly unremarkable in appearance, without evidence of loosening. 3. Moderate knee joint effusion noted.   Electronically Signed   By: Roanna Raider M.D.   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 The bones appear osteopenic. No evidence of acute fracture or malalignment. Small os navicularis. Extensive soft tissue swelling noted over the dorsum of the foot. Degenerative osteoarthritis at the tibiotalar joint. Mild degenerative change in the midfoot. Plantar calcaneal spur. Scattered atherosclerotic vascular calcifications.  IMPRESSION: Soft tissue swelling over the dorsum of the foot without evidence of underlying fracture or malalignment.  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 Next item  Ankle joint degenerative osteoarthritis.   Electronically Signed   By: Malachy Moan M.D.   On: 12/13/2014 22:31     MDM   Xrays.  Pt has ride, does not have to drive.  Ultram po.  Reviewed nursing notes and prior charts for additional history.   Pt comfortable. Spine nt. Knee stable. Distal pulses palp. xrays without fx.   Pt denies faintness or dizziness. Ambulates w walker.   Pt currently appears stable for d/c.      Cathren Laine, MD 12/14/14 [phone]

## 2014-12-22 ENCOUNTER — Telehealth: Payer: Self-pay | Admitting: Hematology and Oncology

## 2014-12-22 NOTE — Telephone Encounter (Signed)
left message for patient to return call

## 2015-01-02 ENCOUNTER — Other Ambulatory Visit: Payer: Self-pay | Admitting: Hematology and Oncology

## 2015-01-02 ENCOUNTER — Ambulatory Visit: Payer: 59 | Admitting: Hematology and Oncology

## 2015-01-02 DIAGNOSIS — D6489 Other specified anemias: Secondary | ICD-10-CM

## 2015-01-31 DIAGNOSIS — I69339 Monoplegia of upper limb following cerebral infarction affecting unspecified side: Secondary | ICD-10-CM | POA: Diagnosis not present

## 2015-01-31 DIAGNOSIS — E0800 Diabetes mellitus due to underlying condition with hyperosmolarity without nonketotic hyperglycemic-hyperosmolar coma (NKHHC): Secondary | ICD-10-CM | POA: Diagnosis not present

## 2015-04-10 DIAGNOSIS — M21372 Foot drop, left foot: Secondary | ICD-10-CM | POA: Diagnosis not present

## 2015-04-10 DIAGNOSIS — E0800 Diabetes mellitus due to underlying condition with hyperosmolarity without nonketotic hyperglycemic-hyperosmolar coma (NKHHC): Secondary | ICD-10-CM | POA: Diagnosis not present

## 2015-04-21 ENCOUNTER — Encounter (HOSPITAL_COMMUNITY): Payer: Self-pay

## 2015-04-21 ENCOUNTER — Emergency Department (HOSPITAL_COMMUNITY)
Admission: EM | Admit: 2015-04-21 | Discharge: 2015-04-21 | Disposition: A | Discharge status: Home / Self Care | Payer: Medicare Other | Attending: Emergency Medicine | Admitting: Emergency Medicine

## 2015-04-21 ENCOUNTER — Emergency Department (HOSPITAL_COMMUNITY): Payer: Medicare Other

## 2015-04-21 DIAGNOSIS — Z87891 Personal history of nicotine dependence: Secondary | ICD-10-CM | POA: Diagnosis not present

## 2015-04-21 DIAGNOSIS — M545 Low back pain: Secondary | ICD-10-CM | POA: Diagnosis not present

## 2015-04-21 DIAGNOSIS — Z8673 Personal history of transient ischemic attack (TIA), and cerebral infarction without residual deficits: Secondary | ICD-10-CM | POA: Insufficient documentation

## 2015-04-21 DIAGNOSIS — Z79899 Other long term (current) drug therapy: Secondary | ICD-10-CM | POA: Insufficient documentation

## 2015-04-21 DIAGNOSIS — W1839XA Other fall on same level, initial encounter: Secondary | ICD-10-CM | POA: Insufficient documentation

## 2015-04-21 DIAGNOSIS — S79919A Unspecified injury of unspecified hip, initial encounter: Secondary | ICD-10-CM | POA: Diagnosis not present

## 2015-04-21 DIAGNOSIS — I10 Essential (primary) hypertension: Secondary | ICD-10-CM | POA: Diagnosis not present

## 2015-04-21 DIAGNOSIS — Y9389 Activity, other specified: Secondary | ICD-10-CM | POA: Diagnosis not present

## 2015-04-21 DIAGNOSIS — D649 Anemia, unspecified: Secondary | ICD-10-CM | POA: Diagnosis not present

## 2015-04-21 DIAGNOSIS — Y9289 Other specified places as the place of occurrence of the external cause: Secondary | ICD-10-CM | POA: Insufficient documentation

## 2015-04-21 DIAGNOSIS — S3992XA Unspecified injury of lower back, initial encounter: Secondary | ICD-10-CM | POA: Insufficient documentation

## 2015-04-21 DIAGNOSIS — Z9849 Cataract extraction status, unspecified eye: Secondary | ICD-10-CM | POA: Insufficient documentation

## 2015-04-21 DIAGNOSIS — E119 Type 2 diabetes mellitus without complications: Secondary | ICD-10-CM | POA: Insufficient documentation

## 2015-04-21 DIAGNOSIS — Y998 Other external cause status: Secondary | ICD-10-CM | POA: Insufficient documentation

## 2015-04-21 DIAGNOSIS — Z7982 Long term (current) use of aspirin: Secondary | ICD-10-CM | POA: Insufficient documentation

## 2015-04-21 DIAGNOSIS — Z981 Arthrodesis status: Secondary | ICD-10-CM | POA: Insufficient documentation

## 2015-04-21 MED ORDER — OXYCODONE-ACETAMINOPHEN 5-325 MG PO TABS
1.0000 | ORAL_TABLET | Freq: Once | ORAL | Status: AC
  Administered 2015-04-21: 1 via ORAL
  Filled 2015-04-21: qty 1

## 2015-04-21 MED ORDER — DIAZEPAM 5 MG PO TABS
5.0000 mg | ORAL_TABLET | Freq: Once | ORAL | Status: AC
  Administered 2015-04-21: 5 mg via ORAL
  Filled 2015-04-21: qty 1

## 2015-04-21 MED ORDER — OXYCODONE-ACETAMINOPHEN 5-325 MG PO TABS
1.0000 | ORAL_TABLET | Freq: Once | ORAL | Status: AC
Start: 2015-04-21 — End: 2015-04-21
  Administered 2015-04-21: 1 via ORAL
  Filled 2015-04-21: qty 1

## 2015-04-21 MED ORDER — OXYCODONE-ACETAMINOPHEN 5-325 MG PO TABS: 2.0000 | ORAL_TABLET | ORAL | Status: DC | PRN

## 2015-04-21 NOTE — ED Notes (Signed)
Patient transported to X-ray 

## 2015-04-21 NOTE — ED Provider Notes (Signed)
CSN: [id number]     Arrival date & time 04/21/15  1322 History   First MD Initiated Contact with Patient 04/21/15 1338     Chief Complaint  Patient presents with  . Hip Pain  . Tailbone Pain     (Consider location/radiation/quality/duration/timing/severity/associated sxs/prior Treatment) HPI Comments: Patient here complaining of lower lumbosacral pain after a fall 4 days ago. Patient uses a walker and lost his balance and fell onto his buttocks. No head injury no loss of consciousness. Denies any neck pain. No chest or abdominal pain. No bowel or bladder dysfunction. Pain characterized as sharp and worse with movement. No radiation to his legs. Does have history of prior CVA in the past and those symptoms are stable. Has been using his home meds without relief  Patient is a 74 y.o. male presenting with hip pain. The history is provided by the patient.  Hip Pain    Past Medical History  Diagnosis Date  . S/P lumbar fusion   . Hypertension   . Diabetes mellitus   . Cataract   . Stroke   . Anemia, unspecified 06/08/2013   Past Surgical History  Procedure Laterality Date  . Cataract extraction    . Knee surgery    . Spine surgery    . Eye surgery    . Neck surgery    . Tonsilectomy, adenoidectomy, bilateral myringotomy and tubes     Family History  Problem Relation Age of Onset  . Stroke Mother    Social History  Substance Use Topics  . Smoking status: Former Games developer  . Smokeless tobacco: Former Neurosurgeon  . Alcohol Use: No    Review of Systems  All other systems reviewed and are negative.     Allergies  Review of patient's allergies indicates no known allergies.  Home Medications   Prior to Admission medications   Medication Sig Start Date End Date Taking? Authorizing Provider  allopurinol (ZYLOPRIM) 300 MG tablet Take 300 mg by mouth every morning.    Historical Provider, MD  amLODipine (NORVASC) 10 MG tablet Take 10 mg by mouth every morning.  12/19/11   Historical  Provider, MD  aspirin 325 MG tablet Take 325 mg by mouth daily.    Historical Provider, MD  cloNIDine (CATAPRES - DOSED IN MG/24 HR) 0.3 mg/24hr Place 1 patch onto the skin every Sunday.  11/08/11   Historical Provider, MD  colchicine 0.6 MG tablet Take 0.6 mg by mouth daily.    Historical Provider, MD  gabapentin (NEURONTIN) 300 MG capsule Take 300 mg by mouth 2 (two) times daily.    Historical Provider, MD  HYDROcodone-acetaminophen (NORCO/VICODIN) 5-325 MG per tablet Take 1 tablet by mouth every 6 (six) hours as needed for moderate pain. Patient not taking: Reported on 12/14/2014 01/17/14   Charlestine Night, PA-C  Iron-FA-B Cmp-C-Biot-Probiotic (FUSION PLUS PO) Take 1 capsule by mouth daily.    Historical Provider, MD  lisinopril (PRINIVIL,ZESTRIL) 40 MG tablet Take 40 mg by mouth every morning.  12/19/11   Historical Provider, MD  Multiple Vitamins-Minerals (PRESERVISION AREDS PO) Take 1 capsule by mouth 2 (two) times daily.    Historical Provider, MD  omega-3 acid ethyl esters (LOVAZA) 1 G capsule Take 1 g by mouth daily.    Historical Provider, MD  OXcarbazepine (TRILEPTAL) 150 MG tablet Take 1 tablet (150 mg total) by mouth 2 (two) times daily. 02/08/13   Ranelle Oyster, MD  pravastatin (PRAVACHOL) 10 MG tablet Take 10 mg by mouth every evening.  12/17/11   Historical Provider, MD  pregabalin (LYRICA) 100 MG capsule Take 100 mg by mouth 3 (three) times daily.    Historical Provider, MD  PRESCRIPTION MEDICATION Apply 1 application topically 3 (three) times daily as needed (arthritis). Compound medication for arthritis    Historical Provider, MD  tiZANidine (ZANAFLEX) 4 MG tablet Take 2 mg by mouth every 8 (eight) hours as needed for muscle spasms.    Historical Provider, MD  traMADol (ULTRAM) 50 MG tablet Take 1 tablet (50 mg total) by mouth every 6 (six) hours as needed. 12/14/14   Cathren Laine, MD  triamterene-hydrochlorothiazide (MAXZIDE-25) 37.5-25 MG per tablet Take 1 tablet by mouth every  morning.  12/19/11   Historical Provider, MD   BP 218/96 mmHg  Pulse 66  Temp(Src) 97.5 F (36.4 C) (Oral)  Resp 20  SpO2 100% Physical Exam  Constitutional: He is oriented to person, place, and time. He appears well-developed and well-nourished.  Non-toxic appearance. No distress.  HENT:  Head: Normocephalic and atraumatic.  Eyes: Conjunctivae, EOM and lids are normal. Pupils are equal, round, and reactive to light.  Neck: Normal range of motion. Neck supple. No tracheal deviation present. No thyroid mass present.  Cardiovascular: Normal rate, regular rhythm and normal heart sounds.  Exam reveals no gallop.   No murmur heard. Pulmonary/Chest: Effort normal and breath sounds normal. No stridor. No respiratory distress. He has no decreased breath sounds. He has no wheezes. He has no rhonchi. He has no rales.  Abdominal: Soft. Normal appearance and bowel sounds are normal. He exhibits no distension. There is no tenderness. There is no rebound and no CVA tenderness.  Musculoskeletal: Normal range of motion. He exhibits no edema or tenderness.       Back:  Neurological: He is alert and oriented to person, place, and time. He has normal strength. No cranial nerve deficit or sensory deficit. GCS eye subscore is 4. GCS verbal subscore is 5. GCS motor subscore is 6.  Skin: Skin is warm and dry. No abrasion and no rash noted.  Psychiatric: He has a normal mood and affect. His speech is normal and behavior is normal.  Nursing note and vitals reviewed.   ED Course  Procedures (including critical care time) Labs Review Labs Reviewed - No data to display  Imaging Review No results found. I have personally reviewed and evaluated these images and lab results as part of my medical decision-making.   EKG Interpretation None      MDM   Final diagnoses:  None    Patient given Percocet and Valium for pain here. X-rays are negative. Suspect lumbar sacral strain and stable for  discharge    Lorre Nick, MD 04/21/15 1520

## 2015-04-21 NOTE — Discharge Instructions (Signed)
Contusion °A contusion is a deep bruise. Contusions are the result of an injury that caused bleeding under the skin. The contusion may turn blue, purple, or yellow. Minor injuries will give you a painless contusion, but more severe contusions may stay painful and swollen for a few weeks.  °CAUSES  °A contusion is usually caused by a blow, trauma, or direct force to an area of the body. °SYMPTOMS  °· Swelling and redness of the injured area. °· Bruising of the injured area. °· Tenderness and soreness of the injured area. °· Pain. °DIAGNOSIS  °The diagnosis can be made by taking a history and physical exam. An X-ray, CT scan, or MRI may be needed to determine if there were any associated injuries, such as fractures. °TREATMENT  °Specific treatment will depend on what area of the body was injured. In general, the best treatment for a contusion is resting, icing, elevating, and applying cold compresses to the injured area. Over-the-counter medicines may also be recommended for pain control. Ask your caregiver what the best treatment is for your contusion. °HOME CARE INSTRUCTIONS  °· Put ice on the injured area. °¨ Put ice in a plastic bag. °¨ Place a towel between your skin and the bag. °¨ Leave the ice on for 15-20 minutes, 3-4 times a day, or as directed by your health care provider. °· Only take over-the-counter or prescription medicines for pain, discomfort, or fever as directed by your caregiver. Your caregiver may recommend avoiding anti-inflammatory medicines (aspirin, ibuprofen, and naproxen) for 48 hours because these medicines may increase bruising. °· Rest the injured area. °· If possible, elevate the injured area to reduce swelling. °SEEK IMMEDIATE MEDICAL CARE IF:  °· You have increased bruising or swelling. °· You have pain that is getting worse. °· Your swelling or pain is not relieved with medicines. °MAKE SURE YOU:  °· Understand these instructions. °· Will watch your condition. °· Will get help right  away if you are not doing well or get worse. °Document Released: 05/15/2005 Document Revised: 08/10/2013 Document Reviewed: 06/10/2011 °[employer]® Patient Information ©2015 [employer]. This information is not intended to replace advice given to you by your health care provider. Make sure you discuss any questions you have with your health care provider. ° °

## 2015-04-21 NOTE — ED Notes (Signed)
Pt states fell on Monday.  By Thursday, he started having pain down left lower back to tailbone.  Worse when trying to get up.  Pt also concerned b/c he feels his bp meds need adjusted.

## 2015-04-21 NOTE — ED Notes (Signed)
MD at bedside. 

## 2015-05-02 DIAGNOSIS — I69331 Monoplegia of upper limb following cerebral infarction affecting right dominant side: Secondary | ICD-10-CM | POA: Diagnosis not present

## 2015-05-02 DIAGNOSIS — I10 Essential (primary) hypertension: Secondary | ICD-10-CM | POA: Diagnosis not present

## 2015-05-02 DIAGNOSIS — D6481 Anemia due to antineoplastic chemotherapy: Secondary | ICD-10-CM | POA: Diagnosis not present

## 2015-05-02 DIAGNOSIS — E0800 Diabetes mellitus due to underlying condition with hyperosmolarity without nonketotic hyperglycemic-hyperosmolar coma (NKHHC): Secondary | ICD-10-CM | POA: Diagnosis not present

## 2015-05-02 DIAGNOSIS — E0840 Diabetes mellitus due to underlying condition with diabetic neuropathy, unspecified: Secondary | ICD-10-CM | POA: Diagnosis not present

## 2015-08-24 DIAGNOSIS — H353131 Nonexudative age-related macular degeneration, bilateral, early dry stage: Secondary | ICD-10-CM | POA: Diagnosis not present

## 2015-08-24 DIAGNOSIS — H2511 Age-related nuclear cataract, right eye: Secondary | ICD-10-CM | POA: Diagnosis not present

## 2015-08-24 DIAGNOSIS — E119 Type 2 diabetes mellitus without complications: Secondary | ICD-10-CM | POA: Diagnosis not present

## 2015-08-24 DIAGNOSIS — Z961 Presence of intraocular lens: Secondary | ICD-10-CM | POA: Diagnosis not present

## 2015-08-24 DIAGNOSIS — H35031 Hypertensive retinopathy, right eye: Secondary | ICD-10-CM | POA: Diagnosis not present

## 2015-09-01 DIAGNOSIS — I69339 Monoplegia of upper limb following cerebral infarction affecting unspecified side: Secondary | ICD-10-CM | POA: Diagnosis not present

## 2015-09-01 DIAGNOSIS — D649 Anemia, unspecified: Secondary | ICD-10-CM | POA: Diagnosis not present

## 2015-09-01 DIAGNOSIS — E0800 Diabetes mellitus due to underlying condition with hyperosmolarity without nonketotic hyperglycemic-hyperosmolar coma (NKHHC): Secondary | ICD-10-CM | POA: Diagnosis not present

## 2015-09-01 DIAGNOSIS — I10 Essential (primary) hypertension: Secondary | ICD-10-CM | POA: Diagnosis not present

## 2015-09-08 DIAGNOSIS — M545 Low back pain: Secondary | ICD-10-CM | POA: Diagnosis not present

## 2015-11-30 ENCOUNTER — Emergency Department (HOSPITAL_COMMUNITY): Payer: Medicare Other

## 2015-11-30 ENCOUNTER — Encounter (HOSPITAL_COMMUNITY): Payer: Self-pay | Admitting: Family Medicine

## 2015-11-30 ENCOUNTER — Emergency Department (HOSPITAL_COMMUNITY)
Admission: EM | Admit: 2015-11-30 | Discharge: 2015-11-30 | Disposition: A | Discharge status: Home / Self Care | Payer: Medicare Other | Attending: Emergency Medicine | Admitting: Emergency Medicine

## 2015-11-30 DIAGNOSIS — E119 Type 2 diabetes mellitus without complications: Secondary | ICD-10-CM | POA: Insufficient documentation

## 2015-11-30 DIAGNOSIS — Z8669 Personal history of other diseases of the nervous system and sense organs: Secondary | ICD-10-CM | POA: Diagnosis not present

## 2015-11-30 DIAGNOSIS — I10 Essential (primary) hypertension: Secondary | ICD-10-CM | POA: Insufficient documentation

## 2015-11-30 DIAGNOSIS — M5432 Sciatica, left side: Secondary | ICD-10-CM | POA: Insufficient documentation

## 2015-11-30 DIAGNOSIS — Z8673 Personal history of transient ischemic attack (TIA), and cerebral infarction without residual deficits: Secondary | ICD-10-CM | POA: Diagnosis not present

## 2015-11-30 DIAGNOSIS — R072 Precordial pain: Secondary | ICD-10-CM | POA: Diagnosis not present

## 2015-11-30 DIAGNOSIS — Z7982 Long term (current) use of aspirin: Secondary | ICD-10-CM | POA: Insufficient documentation

## 2015-11-30 DIAGNOSIS — D649 Anemia, unspecified: Secondary | ICD-10-CM | POA: Insufficient documentation

## 2015-11-30 DIAGNOSIS — R079 Chest pain, unspecified: Secondary | ICD-10-CM | POA: Diagnosis not present

## 2015-11-30 DIAGNOSIS — Z79899 Other long term (current) drug therapy: Secondary | ICD-10-CM | POA: Insufficient documentation

## 2015-11-30 DIAGNOSIS — Z981 Arthrodesis status: Secondary | ICD-10-CM | POA: Insufficient documentation

## 2015-11-30 DIAGNOSIS — Z87891 Personal history of nicotine dependence: Secondary | ICD-10-CM | POA: Insufficient documentation

## 2015-11-30 DIAGNOSIS — M549 Dorsalgia, unspecified: Secondary | ICD-10-CM | POA: Diagnosis not present

## 2015-11-30 LAB — BASIC METABOLIC PANEL
ANION GAP: 8 (ref 5–15)
BUN: 21 mg/dL — ABNORMAL HIGH (ref 6–20)
CALCIUM: 8.8 mg/dL — AB (ref 8.9–10.3)
CO2: 23 mmol/L (ref 22–32)
Chloride: 107 mmol/L (ref 101–111)
Creatinine, Ser: 1.85 mg/dL — ABNORMAL HIGH (ref 0.61–1.24)
GFR, EST AFRICAN AMERICAN: 40 mL/min — AB (ref >60)
GFR, EST NON AFRICAN AMERICAN: 35 mL/min — AB (ref >60)
Glucose, Bld: 115 mg/dL — ABNORMAL HIGH (ref 65–99)
POTASSIUM: 3.9 mmol/L (ref 3.5–5.1)
SODIUM: 138 mmol/L (ref 135–145)

## 2015-11-30 LAB — CBC
HEMATOCRIT: 34.5 % — AB (ref 39.0–52.0)
HEMOGLOBIN: 11.5 g/dL — AB (ref 13.0–17.0)
MCH: 27.5 pg (ref 26.0–34.0)
MCHC: 33.3 g/dL (ref 30.0–36.0)
MCV: 82.5 fL (ref 78.0–100.0)
Platelets: 215 10*3/uL (ref 150–400)
RBC: 4.18 MIL/uL — ABNORMAL LOW (ref 4.22–5.81)
RDW: 14.9 % (ref 11.5–15.5)
WBC: 6.7 10*3/uL (ref 4.0–10.5)

## 2015-11-30 LAB — I-STAT TROPONIN, ED
TROPONIN I, POC: 0 ng/mL (ref 0.00–0.08)
TROPONIN I, POC: 0.01 ng/mL (ref 0.00–0.08)

## 2015-11-30 MED ORDER — OXYCODONE-ACETAMINOPHEN 5-325 MG PO TABS: 1.0000 | ORAL_TABLET | Freq: Four times a day (QID) | ORAL | Status: DC | PRN

## 2015-11-30 MED ORDER — METHYLPREDNISOLONE 4 MG PO TBPK: ORAL_TABLET | ORAL | Status: DC

## 2015-11-30 NOTE — Discharge Instructions (Signed)
Nonspecific Chest Pain  Chest pain can be caused by many different conditions. There is always a chance that your pain could be related to something serious, such as a heart attack or a blood clot in your lungs. Chest pain can also be caused by conditions that are not life-threatening. If you have chest pain, it is very important to follow up with your health care provider. CAUSES  Chest pain can be caused by:  Heartburn.  Pneumonia or bronchitis.  Anxiety or stress.  Inflammation around your heart (pericarditis) or lung (pleuritis or pleurisy).  A blood clot in your lung.  A collapsed lung (pneumothorax). It can develop suddenly on its own (spontaneous pneumothorax) or from trauma to the chest.  Shingles infection (varicella-zoster virus).  Heart attack.  Damage to the bones, muscles, and cartilage that make up your chest wall. This can include:  Bruised bones due to injury.  Strained muscles or cartilage due to frequent or repeated coughing or overwork.  Fracture to one or more ribs.  Sore cartilage due to inflammation (costochondritis). RISK FACTORS  Risk factors for chest pain may include:  Activities that increase your risk for trauma or injury to your chest.  Respiratory infections or conditions that cause frequent coughing.  Medical conditions or overeating that can cause heartburn.  Heart disease or family history of heart disease.  Conditions or health behaviors that increase your risk of developing a blood clot.  Having had chicken pox (varicella zoster). SIGNS AND SYMPTOMS Chest pain can feel like:  Burning or tingling on the surface of your chest or deep in your chest.  Crushing, pressure, aching, or squeezing pain.  Dull or sharp pain that is worse when you move, cough, or take a deep breath.  Pain that is also felt in your back, neck, shoulder, or arm, or pain that spreads to any of these areas. Your chest pain may come and go, or it may stay  constant. DIAGNOSIS Lab tests or other studies may be needed to find the cause of your pain. Your health care provider may have you take a test called an ambulatory ECG (electrocardiogram). An ECG records your heartbeat patterns at the time the test is performed. You may also have other tests, such as:  Transthoracic echocardiogram (TTE). During echocardiography, sound waves are used to create a picture of all of the heart structures and to look at how blood flows through your heart.  Transesophageal echocardiogram (TEE).This is a more advanced imaging test that obtains images from inside your body. It allows your health care provider to see your heart in finer detail.  Cardiac monitoring. This allows your health care provider to monitor your heart rate and rhythm in real time.  Holter monitor. This is a portable device that records your heartbeat and can help to diagnose abnormal heartbeats. It allows your health care provider to track your heart activity for several days, if needed.  Stress tests. These can be done through exercise or by taking medicine that makes your heart beat more quickly.  Blood tests.  Imaging tests. TREATMENT  Your treatment depends on what is causing your chest pain. Treatment may include:  Medicines. These may include:  Acid blockers for heartburn.  Anti-inflammatory medicine.  Pain medicine for inflammatory conditions.  Antibiotic medicine, if an infection is present.  Medicines to dissolve blood clots.  Medicines to treat coronary artery disease.  Supportive care for conditions that do not require medicines. This may include:  Resting.  Applying heat  or cold packs to injured areas.  Limiting activities until pain decreases. HOME CARE INSTRUCTIONS  If you were prescribed an antibiotic medicine, finish it all even if you start to feel better.  Avoid any activities that bring on chest pain.  Do not use any tobacco products, including  cigarettes, chewing tobacco, or electronic cigarettes. If you need help quitting, ask your health care provider.  Do not drink alcohol.  Take medicines only as directed by your health care provider.  Keep all follow-up visits as directed by your health care provider. This is important. This includes any further testing if your chest pain does not go away.  If heartburn is the cause for your chest pain, you may be told to keep your head raised (elevated) while sleeping. This reduces the chance that acid will go from your stomach into your esophagus.  Make lifestyle changes as directed by your health care provider. These may include:  Getting regular exercise. Ask your health care provider to suggest some activities that are safe for you.  Eating a heart-healthy diet. A registered dietitian can help you to learn healthy eating options.  Maintaining a healthy weight.  Managing diabetes, if necessary.  Reducing stress. SEEK MEDICAL CARE IF:  Your chest pain does not go away after treatment.  You have a rash with blisters on your chest.  You have a fever. SEEK IMMEDIATE MEDICAL CARE IF:   Your chest pain is worse.  You have an increasing cough, or you cough up blood.  You have severe abdominal pain.  You have severe weakness.  You faint.  You have chills.  You have sudden, unexplained chest discomfort.  You have sudden, unexplained discomfort in your arms, back, neck, or jaw.  You have shortness of breath at any time.  You suddenly start to sweat, or your skin gets clammy.  You feel nauseous or you vomit.  You suddenly feel light-headed or dizzy.  Your heart begins to beat quickly, or it feels like it is skipping beats. These symptoms may represent a serious problem that is an emergency. Do not wait to see if the symptoms will go away. Get medical help right away. Call your local emergency services (911 in the [place] Do not drive yourself to the hospital.   This  information is not intended to replace advice given to you by your health care provider. Make sure you discuss any questions you have with your health care provider.   Document Released: 05/15/2005 Document Revised: 08/26/2014 Document Reviewed: 03/11/2014 Elsevier Interactive Patient Education 2016 [employer] Inc[employer]  Sciatica Sciatica is pain, weakness, numbness, or tingling along the path of the sciatic nerve. The nerve starts in the lower back and runs down the back of each leg. The nerve controls the muscles in the lower leg and in the back of the knee, while also providing sensation to the back of the thigh, lower leg, and the sole of your foot. Sciatica is a symptom of another medical condition. For instance, nerve damage or certain conditions, such as a herniated disk or bone spur on the spine, pinch or put pressure on the sciatic nerve. This causes the pain, weakness, or other sensations normally associated with sciatica. Generally, sciatica only affects one side of the body. CAUSES   Herniated or slipped disc.  Degenerative disk disease.  A pain disorder involving the narrow muscle in the buttocks (piriformis syndrome).  Pelvic injury or fracture.  Pregnancy.  Tumor (rare). SYMPTOMS  Symptoms can vary from  mild to very severe. The symptoms usually travel from the low back to the buttocks and down the back of the leg. Symptoms can include:  Mild tingling or dull aches in the lower back, leg, or hip.  Numbness in the back of the calf or sole of the foot.  Burning sensations in the lower back, leg, or hip.  Sharp pains in the lower back, leg, or hip.  Leg weakness.  Severe back pain inhibiting movement. These symptoms may get worse with coughing, sneezing, laughing, or prolonged sitting or standing. Also, being overweight may worsen symptoms. DIAGNOSIS  Your caregiver will perform a physical exam to look for common symptoms of sciatica. He or she may ask you to do certain  movements or activities that would trigger sciatic nerve pain. Other tests may be performed to find the cause of the sciatica. These may include:  Blood tests.  X-rays.  Imaging tests, such as an MRI or CT scan. TREATMENT  Treatment is directed at the cause of the sciatic pain. Sometimes, treatment is not necessary and the pain and discomfort goes away on its own. If treatment is needed, your caregiver may suggest:  Over-the-counter medicines to relieve pain.  Prescription medicines, such as anti-inflammatory medicine, muscle relaxants, or narcotics.  Applying heat or ice to the painful area.  Steroid injections to lessen pain, irritation, and inflammation around the nerve.  Reducing activity during periods of pain.  Exercising and stretching to strengthen your abdomen and improve flexibility of your spine. Your caregiver may suggest losing weight if the extra weight makes the back pain worse.  Physical therapy.  Surgery to eliminate what is pressing or pinching the nerve, such as a bone spur or part of a herniated disk. HOME CARE INSTRUCTIONS   Only take over-the-counter or prescription medicines for pain or discomfort as directed by your caregiver.  Apply ice to the affected area for 20 minutes, 3-4 times a day for the first 48-72 hours. Then try heat in the same way.  Exercise, stretch, or perform your usual activities if these do not aggravate your pain.  Attend physical therapy sessions as directed by your caregiver.  Keep all follow-up appointments as directed by your caregiver.  Do not wear high heels or shoes that do not provide proper support.  Check your mattress to see if it is too soft. A firm mattress may lessen your pain and discomfort. SEEK IMMEDIATE MEDICAL CARE IF:   You lose control of your bowel or bladder (incontinence).  You have increasing weakness in the lower back, pelvis, buttocks, or legs.  You have redness or swelling of your back.  You have  a burning sensation when you urinate.  You have pain that gets worse when you lie down or awakens you at night.  Your pain is worse than you have experienced in the past.  Your pain is lasting longer than 4 weeks.  You are suddenly losing weight without reason. MAKE SURE YOU:  Understand these instructions.  Will watch your condition.  Will get help right away if you are not doing well or get worse.   This information is not intended to replace advice given to you by your health care provider. Make sure you discuss any questions you have with your health care provider.   Document Released: 07/30/2001 Document Revised: 04/26/2015 Document Reviewed: 12/15/2011 Elsevier Interactive Patient Education [employer]

## 2015-11-30 NOTE — ED Provider Notes (Signed)
CSN: [id number]     Arrival date & time 11/30/15  0206 History   By signing my name below, I, Luis Poole, attest that this documentation has been prepared under the direction and in the presence of Luis Crease, MD.  Electronically Signed: Arlan Poole, ED Scribe. 11/30/2015. 2:47 AM.   Chief Complaint  Patient presents with  . Chest Pain  . Leg Pain  . Back Pain    The history is provided by the patient. No language interpreter was used.    HPI Comments: Luis Poole is a 75 y.o. male with a PMHx of HTN, DM, and stroke who presents to the Emergency Department complaining of constant, ongoing, worsening mid-sternal chest pain onset 11:00 AM yesterday while cooking. Discomfort is exacerbated with certain movements and twisting. No alleviating factors. Pt also reports ongoing L leg pain and intermittent numbness to the L foot which is chronic in nature. No OTC medications or home remedies attempted prior to arrival. No recent fever, chills, nausea, vomiting, or shortness of breath. No known allergies to medications.  PCP: [name], MD    Past Medical History  Diagnosis Date  . S/P lumbar fusion   . Hypertension   . Diabetes mellitus   . Cataract   . Stroke (HCC)   . Anemia, unspecified 06/08/2013   Past Surgical History  Procedure Laterality Date  . Cataract extraction    . Knee surgery    . Spine surgery    . Eye surgery    . Neck surgery    . Tonsilectomy, adenoidectomy, bilateral myringotomy and tubes     Family History  Problem Relation Age of Onset  . Stroke Mother    Social History  Substance Use Topics  . Smoking status: Former Games developer  . Smokeless tobacco: Former Neurosurgeon  . Alcohol Use: No    Review of Systems  Constitutional: Negative for fever and chills.  Respiratory: Negative for shortness of breath.   Cardiovascular: Positive for chest pain.  Gastrointestinal: Negative for nausea, vomiting and abdominal pain.  Musculoskeletal: Positive for  arthralgias.  Neurological: Negative for headaches.  Psychiatric/Behavioral: Negative for confusion.  All other systems reviewed and are negative.     Allergies  Review of patient's allergies indicates no known allergies.  Home Medications   Prior to Admission medications   Medication Sig Start Date End Date Taking? Authorizing Provider  pregabalin (LYRICA) 100 MG capsule Take 100 mg by mouth 3 (three) times daily.   Yes Historical Provider, MD  PRESCRIPTION MEDICATION Apply 1 application topically 3 (three) times daily as needed (arthritis). Compound medication for arthritis   Yes Historical Provider, MD  terbinafine (LAMISIL) 250 MG tablet Take 250 mg by mouth daily.   Yes Historical Provider, MD  triamterene-hydrochlorothiazide (MAXZIDE-25) 37.5-25 MG per tablet Take 1 tablet by mouth every morning.  12/19/11  Yes Historical Provider, MD  allopurinol (ZYLOPRIM) 300 MG tablet Take 300 mg by mouth every morning.    Historical Provider, MD  amLODipine (NORVASC) 10 MG tablet Take 10 mg by mouth every morning.  12/19/11   Historical Provider, MD  aspirin 325 MG tablet Take 325 mg by mouth daily.    Historical Provider, MD  cloNIDine (CATAPRES - DOSED IN MG/24 HR) 0.3 mg/24hr Place 1 patch onto the skin daily as needed (for high bp).  11/08/11   Historical Provider, MD  colchicine 0.6 MG tablet Take 0.6 mg by mouth daily.    Historical Provider, MD  gabapentin (NEURONTIN) 300  MG capsule Take 300 mg by mouth 3 (three) times daily.    Historical Provider, MD  HYDROcodone-acetaminophen (NORCO/VICODIN) 5-325 MG per tablet Take 1 tablet by mouth every 6 (six) hours as needed for moderate pain. 01/17/14   Margurete Guaman Lawyer, PA-C  Iron-FA-B Cmp-C-Biot-Probiotic (FUSION PLUS PO) Take 1 capsule by mouth daily.    Historical Provider, MD  lisinopril (PRINIVIL,ZESTRIL) 40 MG tablet Take 40 mg by mouth every morning.  12/19/11   Historical Provider, MD  Multiple Vitamins-Minerals (PRESERVISION AREDS PO) Take 1  capsule by mouth 2 (two) times daily.    Historical Provider, MD  omega-3 acid ethyl esters (LOVAZA) 1 G capsule Take 1 g by mouth daily.    Historical Provider, MD  OXcarbazepine (TRILEPTAL) 150 MG tablet Take 1 tablet (150 mg total) by mouth 2 (two) times daily. 02/08/13   Ranelle Oyster, MD  oxyCODONE-acetaminophen (PERCOCET/ROXICET) 5-325 MG per tablet Take 2 tablets by mouth every 4 (four) hours as needed for severe pain. 04/21/15   Lorre Nick, MD  pravastatin (PRAVACHOL) 10 MG tablet Take 10 mg by mouth daily as needed (for high cholesterol).  12/17/11   Historical Provider, MD   Triage Vitals: BP 159/76 mmHg  Pulse 84  Temp(Src) 98.5 F (36.9 C) (Oral)  Resp 20  Ht  (1.753 m)  Wt 221 lb (100.245 kg)  BMI 32.62 kg/m2  SpO2 99%   Physical Exam  Constitutional: He is oriented to person, place, and time. He appears well-developed and well-nourished. No distress.  HENT:  Head: Normocephalic and atraumatic.  Right Ear: Hearing normal.  Left Ear: Hearing normal.  Nose: Nose normal.  Mouth/Throat: Oropharynx is clear and moist and mucous membranes are normal.  Eyes: Conjunctivae and EOM are normal. Pupils are equal, round, and reactive to light.  Neck: Normal range of motion. Neck supple.  Cardiovascular: Normal rate, regular rhythm, S1 normal, S2 normal and normal heart sounds.  Exam reveals no gallop and no friction rub.   No murmur heard. Pulmonary/Chest: Effort normal and breath sounds normal. No respiratory distress. He exhibits no tenderness.  Abdominal: Soft. Normal appearance and bowel sounds are normal. There is no hepatosplenomegaly. There is no tenderness. There is no rebound, no guarding, no tenderness at McBurney's point and negative Murphy's sign. No hernia.  Musculoskeletal: Normal range of motion.  Neurological: He is alert and oriented to person, place, and time. He has normal strength. No cranial nerve deficit or sensory deficit. Coordination normal. GCS eye  subscore is 4. GCS verbal subscore is 5. GCS motor subscore is 6.  Skin: Skin is warm, dry and intact. No rash noted. No cyanosis.  Psychiatric: He has a normal mood and affect. His speech is normal and behavior is normal. Thought content normal.  Nursing note and vitals reviewed.   ED Course  Procedures (including critical care time)  DIAGNOSTIC STUDIES: Oxygen Saturation is 99% on RA, Normal by my interpretation.    COORDINATION OF CARE: 2:42 AM- Will order blood work, imaging, and EKG. Discussed treatment plan with pt at bedside and pt agreed to plan.     Labs Review Labs Reviewed  BASIC METABOLIC PANEL  CBC  I-STAT TROPOININ, ED    Imaging Review No results found. I have personally reviewed and evaluated these images and lab results as part of my medical decision-making.   EKG Interpretation   Date/Time:  Thursday November 30 2015 02:18:44 EDT Ventricular Rate:  79 PR Interval:  186 QRS Duration: 92 QT Interval:  371 QTC Calculation: 425 R Axis:   21 Text Interpretation:  Sinus rhythm Atrial premature complexes Low voltage,  precordial leads Abnormal R-wave progression, early transition No  significant change since last tracing Confirmed by Rue Tinnel  MD,  Leander Tout [phone]([id number]) on 11/30/2015 2:22:18 AM      MDM   Final diagnoses:  None  Chest pain Sciatica  Presents to the emergency department for evaluation of multiple problems. Patient reports that he has been expressing pain in his lower back and radiates down his left leg all the way to the toes. This is been ongoing for at least 3 weeks. Reviewing his records, however, reveals that he has been seen multiple times over the years with similar complaints. He does not have any acute signs of cauda equina syndrome. He does report that he had a fall 3 weeks ago when the pain started, x-ray performed today did not show any lumbosacral abnormality.  Chest pain. He has been experiencing intermittent episodes of midsternal  chest pain since an argument with his son yesterday. His initial EKG did not show any evidence of ischemia or infarct. Troponin was negative. I discussed admission to the patient. I recommended he stay for observation and cardiac rule out, but he reports that he cannot stay. He reports that he has to watch his granddaughter in the morning and will not be admitted. He reports that he does have a follow-up appointment with his doctor tomorrow. Based on this, I agreed to perform a second troponin and if negative discharge him to follow-up with his doctor tomorrow. He agrees to return to the ER immediately if he has any worsening chest pains. His second troponin has been performed and is negative.  I personally performed the services described in this documentation, which was scribed in my presence. The recorded information has been reviewed and is accurate.   Luis Creasehristopher J Ariadna Setter, MD 11/30/15 [phone]

## 2015-11-30 NOTE — ED Notes (Signed)
Patient is complaining of mid-sternal chest pain. Pain started yesterday while cooking.  Also, patient complains of left leg and mid-back pain. Pt complains of numbness and tingling in left feet but this is chronic complaint.

## 2015-12-03 ENCOUNTER — Encounter (HOSPITAL_COMMUNITY): Payer: Self-pay | Admitting: *Deleted

## 2015-12-03 ENCOUNTER — Emergency Department (HOSPITAL_COMMUNITY)
Admission: EM | Admit: 2015-12-03 | Discharge: 2015-12-03 | Disposition: A | Discharge status: Home / Self Care | Payer: Medicare Other | Attending: Emergency Medicine | Admitting: Emergency Medicine

## 2015-12-03 ENCOUNTER — Emergency Department (HOSPITAL_COMMUNITY): Payer: Medicare Other

## 2015-12-03 DIAGNOSIS — T783XXA Angioneurotic edema, initial encounter: Secondary | ICD-10-CM | POA: Diagnosis not present

## 2015-12-03 DIAGNOSIS — Y998 Other external cause status: Secondary | ICD-10-CM | POA: Diagnosis not present

## 2015-12-03 DIAGNOSIS — X58XXXA Exposure to other specified factors, initial encounter: Secondary | ICD-10-CM | POA: Insufficient documentation

## 2015-12-03 DIAGNOSIS — Y9289 Other specified places as the place of occurrence of the external cause: Secondary | ICD-10-CM | POA: Diagnosis not present

## 2015-12-03 DIAGNOSIS — Y9389 Activity, other specified: Secondary | ICD-10-CM | POA: Diagnosis not present

## 2015-12-03 DIAGNOSIS — Z87891 Personal history of nicotine dependence: Secondary | ICD-10-CM | POA: Diagnosis not present

## 2015-12-03 DIAGNOSIS — Z8673 Personal history of transient ischemic attack (TIA), and cerebral infarction without residual deficits: Secondary | ICD-10-CM | POA: Diagnosis not present

## 2015-12-03 DIAGNOSIS — Z8669 Personal history of other diseases of the nervous system and sense organs: Secondary | ICD-10-CM | POA: Diagnosis not present

## 2015-12-03 DIAGNOSIS — Z7982 Long term (current) use of aspirin: Secondary | ICD-10-CM | POA: Insufficient documentation

## 2015-12-03 DIAGNOSIS — Z862 Personal history of diseases of the blood and blood-forming organs and certain disorders involving the immune mechanism: Secondary | ICD-10-CM | POA: Insufficient documentation

## 2015-12-03 DIAGNOSIS — E119 Type 2 diabetes mellitus without complications: Secondary | ICD-10-CM | POA: Insufficient documentation

## 2015-12-03 DIAGNOSIS — I10 Essential (primary) hypertension: Secondary | ICD-10-CM | POA: Insufficient documentation

## 2015-12-03 DIAGNOSIS — Z79899 Other long term (current) drug therapy: Secondary | ICD-10-CM | POA: Diagnosis not present

## 2015-12-03 DIAGNOSIS — R05 Cough: Secondary | ICD-10-CM | POA: Diagnosis not present

## 2015-12-03 DIAGNOSIS — R220 Localized swelling, mass and lump, head: Secondary | ICD-10-CM | POA: Diagnosis present

## 2015-12-03 LAB — CBC WITH DIFFERENTIAL/PLATELET
BASOS ABS: 0 10*3/uL (ref 0.0–0.1)
BASOS PCT: 0 %
EOS ABS: 0.1 10*3/uL (ref 0.0–0.7)
EOS PCT: 1 %
HCT: 33.3 % — ABNORMAL LOW (ref 39.0–52.0)
Hemoglobin: 11.2 g/dL — ABNORMAL LOW (ref 13.0–17.0)
LYMPHS ABS: 2.3 10*3/uL (ref 0.7–4.0)
LYMPHS PCT: 41 %
MCH: 27.6 pg (ref 26.0–34.0)
MCHC: 33.6 g/dL (ref 30.0–36.0)
MCV: 82 fL (ref 78.0–100.0)
MONO ABS: 0.4 10*3/uL (ref 0.1–1.0)
Monocytes Relative: 7 %
NEUTROS ABS: 2.8 10*3/uL (ref 1.7–7.7)
Neutrophils Relative %: 51 %
PLATELETS: 179 10*3/uL (ref 150–400)
RBC: 4.06 MIL/uL — ABNORMAL LOW (ref 4.22–5.81)
RDW: 14.9 % (ref 11.5–15.5)
WBC: 5.5 10*3/uL (ref 4.0–10.5)

## 2015-12-03 LAB — COMPREHENSIVE METABOLIC PANEL
ALBUMIN: 3.7 g/dL (ref 3.5–5.0)
ALT: 11 U/L — ABNORMAL LOW (ref 17–63)
AST: 18 U/L (ref 15–41)
Alkaline Phosphatase: 63 U/L (ref 38–126)
Anion gap: 5 (ref 5–15)
BUN: 24 mg/dL — AB (ref 6–20)
CHLORIDE: 109 mmol/L (ref 101–111)
CO2: 24 mmol/L (ref 22–32)
Calcium: 9 mg/dL (ref 8.9–10.3)
Creatinine, Ser: 1.62 mg/dL — ABNORMAL HIGH (ref 0.61–1.24)
GFR calc Af Amer: 47 mL/min — ABNORMAL LOW (ref >60)
GFR, EST NON AFRICAN AMERICAN: 41 mL/min — AB (ref >60)
GLUCOSE: 121 mg/dL — AB (ref 65–99)
POTASSIUM: 3.5 mmol/L (ref 3.5–5.1)
SODIUM: 138 mmol/L (ref 135–145)
Total Bilirubin: 0.4 mg/dL (ref 0.3–1.2)
Total Protein: 7.3 g/dL (ref 6.5–8.1)

## 2015-12-03 MED ORDER — SODIUM CHLORIDE 0.9 % IV SOLN
Freq: Once | INTRAVENOUS | Status: AC
  Administered 2015-12-03: 16:00:00 via INTRAVENOUS

## 2015-12-03 MED ORDER — METHYLPREDNISOLONE SODIUM SUCC 125 MG IJ SOLR
125.0000 mg | Freq: Once | INTRAMUSCULAR | Status: AC
  Administered 2015-12-03: 125 mg via INTRAVENOUS
  Filled 2015-12-03: qty 2

## 2015-12-03 MED ORDER — FAMOTIDINE IN NACL 20-0.9 MG/50ML-% IV SOLN
20.0000 mg | Freq: Once | INTRAVENOUS | Status: AC
  Administered 2015-12-03: 20 mg via INTRAVENOUS
  Filled 2015-12-03: qty 50

## 2015-12-03 MED ORDER — DIPHENHYDRAMINE HCL 50 MG/ML IJ SOLN
25.0000 mg | Freq: Once | INTRAMUSCULAR | Status: AC
  Administered 2015-12-03: 25 mg via INTRAVENOUS
  Filled 2015-12-03: qty 1

## 2015-12-03 NOTE — ED Notes (Signed)
Pt presents with upper lip swelling and slight diff breathing.  Pt reports swelling started today.  Denies taking new meds.  Pt takes lisinopril for HTN.

## 2015-12-03 NOTE — Discharge Instructions (Signed)
Angioedema  Angioedema is a sudden swelling of tissues, often of the skin. It can occur on the face or genitals or in the abdomen or other body parts. The swelling usually develops over a short period and gets better in 24 to 48 hours. It often begins during the night and is found when the person wakes up. The person may also get red, itchy patches of skin (hives). Angioedema can be dangerous if it involves swelling of the air passages.   Depending on the cause, episodes of angioedema may only happen once, come back in unpredictable patterns, or repeat for several years and then gradually fade away.   CAUSES   Angioedema can be caused by an allergic reaction to various triggers. It can also result from nonallergic causes, including reactions to drugs, immune system disorders, viral infections, or an abnormal gene that is passed to you from your parents (hereditary). For some people with angioedema, the cause is unknown.   Some things that can trigger angioedema include:    Foods.    Medicines, such as ACE inhibitors, ARBs, nonsteroidal anti-inflammatory agents, or estrogen.    Latex.    Animal saliva.    Insect stings.    Dyes used in X-rays.    Mild injury.    Dental work.   Surgery.   Stress.    Sudden changes in temperature.    Exercise.  SIGNS AND SYMPTOMS    Swelling of the skin.   Hives. If these are present, there is also intense itching.   Redness in the affected area.    Pain in the affected area.   Swollen lips or tongue.   Breathing problems. This may happen if the air passages swell.   Wheezing.  If internal organs are involved, there may be:    Nausea.    Abdominal pain.    Vomiting.    Difficulty swallowing.    Difficulty passing urine.  DIAGNOSIS    Your health care provider will examine the affected area and take a medical and family history.   Various tests may be done to help determine the cause. Tests may include:   Allergy skin tests to see if the problem  is an allergic reaction.    Blood tests to check for hereditary angioedema.    Tests to check for underlying diseases that could cause the condition.    A review of your medicines, including over-the-counter medicines, may be done.  TREATMENT   Treatment will depend on the cause of the angioedema. Possible treatments include:    Removal of anything that triggered the condition (such as stopping certain medicines).    Medicines to treat symptoms or prevent attacks. Medicines given may include:     Antihistamines.     Epinephrine injection.     Steroids.    Hospitalization may be required for severe attacks. If the air passages are affected, it can be an emergency. Tubes may need to be placed to keep the airway open.  HOME CARE INSTRUCTIONS    Take all medicines as directed by your health care provider.   If you were given medicines for emergency allergy treatment, always carry them with you.   Wear a medical bracelet as directed by your health care provider.    Avoid known triggers.  SEEK MEDICAL CARE IF:    You have repeat attacks of angioedema.    Your attacks are more frequent or more severe despite preventive measures.    You have hereditary angioedema   and are considering having children. It is important to discuss with your health care provider the risks of passing the condition on to your children.  SEEK IMMEDIATE MEDICAL CARE IF:    You have severe swelling of the mouth, tongue, or lips.   You have difficulty breathing.    You have difficulty swallowing.    You faint.  MAKE SURE YOU:   Understand these instructions.   Will watch your condition.   Will get help right away if you are not doing well or get worse.     This information is not intended to replace advice given to you by your health care provider. Make sure you discuss any questions you have with your health care provider.     Document Released: 10/14/2001 Document Revised: 08/26/2014 Document Reviewed:  03/29/2013  Elsevier Interactive Patient Education 2016 [employer]

## 2015-12-03 NOTE — ED Notes (Signed)
Patient is alert and oriented x3.  He was given DC instructions and follow up visit instructions.  Patient gave verbal understanding.  He was DC ambulatory under his own power to home.  V/S stable.  He was not showing any signs of distress on DC 

## 2015-12-03 NOTE — ED Provider Notes (Signed)
CSN: [id number]     Arrival date & time 12/03/15  1524 History   First MD Initiated Contact with Patient 12/03/15 1534     Chief Complaint  Patient presents with  . Angioedema     (Consider location/radiation/quality/duration/timing/severity/associated sxs/prior Treatment) Patient is a 75 y.o. male presenting with allergic reaction and facial injury. The history is provided by the patient. No language interpreter was used.  Allergic Reaction Presenting symptoms: swelling   Presenting symptoms: no difficulty swallowing, no itching and no rash   Severity:  Moderate Prior allergic episodes:  No prior episodes Context: grass, insect bite/sting and medications   Relieved by:  Nothing Worsened by:  Nothing tried Ineffective treatments:  None tried Facial Injury Mechanism of injury:  Unable to specify Location:  Face Pain details:    Duration:  2 hours   Timing:  Constant Ineffective treatments:  Prescription drugs Pt is on lisinopril for his blood pressure.  Pt reports he has been taking for over 10 years.  Pt reports he went outside earlier today and when he came inside his son told him that his upper lip was swollen.  Pt reports he only noticed when he looked in the mirror.  Pt reports something could have stung him.    Past Medical History  Diagnosis Date  . S/P lumbar fusion   . Hypertension   . Diabetes mellitus   . Cataract   . Stroke (HCC)   . Anemia, unspecified 06/08/2013   Past Surgical History  Procedure Laterality Date  . Cataract extraction    . Knee surgery    . Spine surgery    . Eye surgery    . Neck surgery    . Tonsilectomy, adenoidectomy, bilateral myringotomy and tubes     Family History  Problem Relation Age of Onset  . Stroke Mother    Social History  Substance Use Topics  . Smoking status: Former Games developer  . Smokeless tobacco: Former Neurosurgeon  . Alcohol Use: No    Review of Systems  HENT: Negative for facial swelling, sore throat, tinnitus,  trouble swallowing and voice change.   Skin: Negative for itching and rash.  All other systems reviewed and are negative.     Allergies  Review of patient's allergies indicates no known allergies.  Home Medications   Prior to Admission medications   Medication Sig Start Date End Date Taking? Authorizing Provider  amLODipine (NORVASC) 10 MG tablet Take 10 mg by mouth every morning.  12/19/11  Yes Historical Provider, MD  aspirin 325 MG tablet Take 325 mg by mouth daily.   Yes Historical Provider, MD  cloNIDine (CATAPRES - DOSED IN MG/24 HR) 0.3 mg/24hr Place 1 patch onto the skin daily as needed (for high bp).  11/08/11  Yes Historical Provider, MD  colchicine 0.6 MG tablet Take 0.6 mg by mouth daily.   Yes Historical Provider, MD  gabapentin (NEURONTIN) 300 MG capsule Take 300 mg by mouth 3 (three) times daily.   Yes Historical Provider, MD  lisinopril (PRINIVIL,ZESTRIL) 40 MG tablet Take 40 mg by mouth every morning.  12/19/11  Yes Historical Provider, MD  pravastatin (PRAVACHOL) 10 MG tablet Take 10 mg by mouth daily as needed (high cholesterol).   Yes Historical Provider, MD  PRESCRIPTION MEDICATION Apply 1 application topically 3 (three) times daily as needed (arthritis). Compound medication for arthritis   Yes Historical Provider, MD  terbinafine (LAMISIL) 250 MG tablet Take 250 mg by mouth daily.   Yes Historical Provider,  MD  triamterene-hydrochlorothiazide (MAXZIDE-25) 37.5-25 MG per tablet Take 1 tablet by mouth every morning.  12/19/11  Yes Historical Provider, MD  methylPREDNISolone (MEDROL DOSEPAK) 4 MG TBPK tablet As directed Patient not taking: Reported on 12/03/2015 11/30/15   Gilda Creasehristopher J Pollina, MD  oxyCODONE-acetaminophen (ROXICET) 5-325 MG tablet Take 1 tablet by mouth every 6 (six) hours as needed for severe pain. Patient not taking: Reported on 12/03/2015 11/30/15   Gilda Creasehristopher J Pollina, MD   There were no vitals taken for this visit. Physical Exam  Constitutional: He is  oriented to person, place, and time. He appears well-developed and well-nourished.  HENT:  Head: Normocephalic and atraumatic.  Right Ear: External ear normal.  Left Ear: External ear normal.  Nose: Nose normal.  Mouth/Throat: Oropharynx is clear and moist.  Swelling upper lip,  No palate edema, throat clear nv and ns intact  Eyes: Conjunctivae and EOM are normal. Pupils are equal, round, and reactive to light.  Neck: Normal range of motion.  Cardiovascular: Normal rate and normal heart sounds.   Pulmonary/Chest: Effort normal.  Abdominal: Soft. He exhibits no distension.  Musculoskeletal: Normal range of motion.  Neurological: He is alert and oriented to person, place, and time.  Skin: Skin is warm.  Psychiatric: He has a normal mood and affect.  Nursing note and vitals reviewed.   ED Course  Procedures (including critical care time) Labs Review Labs Reviewed  CBC WITH DIFFERENTIAL/PLATELET - Abnormal; Notable for the following:    RBC 4.06 (*)    Hemoglobin 11.2 (*)    HCT 33.3 (*)    All other components within normal limits  COMPREHENSIVE METABOLIC PANEL - Abnormal; Notable for the following:    Glucose, Bld 121 (*)    BUN 24 (*)    Creatinine, Ser 1.62 (*)    ALT 11 (*)    GFR calc non Af Amer 41 (*)    GFR calc Af Amer 47 (*)    All other components within normal limits    Imaging Review Dg Chest 2 View  12/03/2015  CLINICAL DATA:  Cough and chest pain.  Short of breath.  Ex-smoker. EXAM: CHEST  2 VIEW COMPARISON:  11/30/2015 FINDINGS: Hyperinflation. Mild left hemidiaphragm elevation. Cervical spine fixation. Midline trachea. Mild cardiomegaly with a tortuous thoracic aorta. No pleural effusion or pneumothorax. No congestive failure. Clear lungs. IMPRESSION: Hyperinflation and mild cardiomegaly.  No acute findings. Electronically Signed   By: Jeronimo GreavesKyle  Talbot M.D.   On: 12/03/2015 16:26   I have personally reviewed and evaluated these images and lab results as part of my  medical decision-making.   EKG Interpretation None      MDM Pt given Iv solumedrol, pepcid and benadryl.  Pt advised I suspect his symptoms are from lisinopril, Pt observed x 4 hours.  Pt had no increase in swelling.  No compromise of airway.  Pt fell asleep and maintained normal pulse ox.  I think insect sting/allergic reaction are less likely.   Pt is advised to stop lisinopril and to see his MD for recheck tomorrow.  Pt is to keep his head elevated.   Continue benadryl at home.     Final diagnoses:  Angioedema, initial encounter    An After Visit Summary was printed and given to the patient. Meds ordered this encounter  Medications  . 0.9 %  sodium chloride infusion    Sig:   . diphenhydrAMINE (BENADRYL) injection 25 mg    Sig:   . methylPREDNISolone sodium  succinate (SOLU-MEDROL) 125 mg/2 mL injection 125 mg    Sig:   . famotidine (PEPCID) IVPB 20 mg premix    Sig:      Elson Areas, PA-C 12/03/15 2013  Lonia Skinner [place], PA-C 12/03/15 2014  Lonia Skinner [place], PA-C 12/03/15 2016  Lavera Guise, MD 12/03/15 2330

## 2015-12-25 DIAGNOSIS — T783XXA Angioneurotic edema, initial encounter: Secondary | ICD-10-CM | POA: Diagnosis not present

## 2015-12-25 DIAGNOSIS — E0840 Diabetes mellitus due to underlying condition with diabetic neuropathy, unspecified: Secondary | ICD-10-CM | POA: Diagnosis not present

## 2015-12-28 DIAGNOSIS — E0800 Diabetes mellitus due to underlying condition with hyperosmolarity without nonketotic hyperglycemic-hyperosmolar coma (NKHHC): Secondary | ICD-10-CM | POA: Diagnosis not present

## 2015-12-28 DIAGNOSIS — I10 Essential (primary) hypertension: Secondary | ICD-10-CM | POA: Diagnosis not present

## 2015-12-28 DIAGNOSIS — I69331 Monoplegia of upper limb following cerebral infarction affecting right dominant side: Secondary | ICD-10-CM | POA: Diagnosis not present

## 2016-01-04 DIAGNOSIS — Z0000 Encounter for general adult medical examination without abnormal findings: Secondary | ICD-10-CM | POA: Diagnosis not present

## 2016-01-12 DIAGNOSIS — I69398 Other sequelae of cerebral infarction: Secondary | ICD-10-CM | POA: Diagnosis not present

## 2016-01-12 DIAGNOSIS — M25562 Pain in left knee: Secondary | ICD-10-CM | POA: Diagnosis not present

## 2016-01-12 DIAGNOSIS — G8310 Monoplegia of lower limb affecting unspecified side: Secondary | ICD-10-CM | POA: Diagnosis not present

## 2016-01-12 DIAGNOSIS — I639 Cerebral infarction, unspecified: Secondary | ICD-10-CM | POA: Diagnosis not present

## 2016-01-12 DIAGNOSIS — E1142 Type 2 diabetes mellitus with diabetic polyneuropathy: Secondary | ICD-10-CM | POA: Diagnosis not present

## 2016-01-12 DIAGNOSIS — M19072 Primary osteoarthritis, left ankle and foot: Secondary | ICD-10-CM | POA: Diagnosis not present

## 2016-01-12 DIAGNOSIS — Z96652 Presence of left artificial knee joint: Secondary | ICD-10-CM | POA: Diagnosis not present

## 2016-01-12 DIAGNOSIS — M79605 Pain in left leg: Secondary | ICD-10-CM | POA: Diagnosis not present

## 2016-02-01 ENCOUNTER — Encounter (HOSPITAL_COMMUNITY): Payer: Self-pay | Admitting: *Deleted

## 2016-02-01 ENCOUNTER — Emergency Department (HOSPITAL_COMMUNITY)
Admission: EM | Admit: 2016-02-01 | Discharge: 2016-02-01 | Disposition: A | Discharge status: Home / Self Care | Payer: Medicare Other | Attending: Emergency Medicine | Admitting: Emergency Medicine

## 2016-02-01 ENCOUNTER — Emergency Department (HOSPITAL_COMMUNITY): Payer: Medicare Other

## 2016-02-01 DIAGNOSIS — I10 Essential (primary) hypertension: Secondary | ICD-10-CM | POA: Insufficient documentation

## 2016-02-01 DIAGNOSIS — R05 Cough: Secondary | ICD-10-CM

## 2016-02-01 DIAGNOSIS — E119 Type 2 diabetes mellitus without complications: Secondary | ICD-10-CM | POA: Diagnosis not present

## 2016-02-01 DIAGNOSIS — J40 Bronchitis, not specified as acute or chronic: Secondary | ICD-10-CM | POA: Insufficient documentation

## 2016-02-01 DIAGNOSIS — Y999 Unspecified external cause status: Secondary | ICD-10-CM | POA: Insufficient documentation

## 2016-02-01 DIAGNOSIS — Z7982 Long term (current) use of aspirin: Secondary | ICD-10-CM | POA: Diagnosis not present

## 2016-02-01 DIAGNOSIS — Z87891 Personal history of nicotine dependence: Secondary | ICD-10-CM | POA: Insufficient documentation

## 2016-02-01 DIAGNOSIS — Z79891 Long term (current) use of opiate analgesic: Secondary | ICD-10-CM | POA: Insufficient documentation

## 2016-02-01 DIAGNOSIS — Y939 Activity, unspecified: Secondary | ICD-10-CM | POA: Insufficient documentation

## 2016-02-01 DIAGNOSIS — Z79899 Other long term (current) drug therapy: Secondary | ICD-10-CM | POA: Insufficient documentation

## 2016-02-01 DIAGNOSIS — W1800XA Striking against unspecified object with subsequent fall, initial encounter: Secondary | ICD-10-CM | POA: Insufficient documentation

## 2016-02-01 DIAGNOSIS — Z8673 Personal history of transient ischemic attack (TIA), and cerebral infarction without residual deficits: Secondary | ICD-10-CM | POA: Insufficient documentation

## 2016-02-01 DIAGNOSIS — Y929 Unspecified place or not applicable: Secondary | ICD-10-CM | POA: Diagnosis not present

## 2016-02-01 DIAGNOSIS — R059 Cough, unspecified: Secondary | ICD-10-CM

## 2016-02-01 DIAGNOSIS — M542 Cervicalgia: Secondary | ICD-10-CM | POA: Diagnosis not present

## 2016-02-01 MED ORDER — ACETAMINOPHEN 325 MG PO TABS
650.0000 mg | ORAL_TABLET | Freq: Once | ORAL | Status: AC
Start: 2016-02-01 — End: 2016-02-01
  Administered 2016-02-01: 650 mg via ORAL
  Filled 2016-02-01: qty 2

## 2016-02-01 MED ORDER — DOXYCYCLINE HYCLATE 100 MG PO CAPS: 100.0000 mg | ORAL_CAPSULE | Freq: Two times a day (BID) | ORAL | Status: DC

## 2016-02-01 NOTE — ED Notes (Signed)
Pt states that he fell 2-3 days ago and c/o left sided neck pain and head pain; pt states that it has continued to hurt since the fall; pt states that he is able to move his head and neck and that it hurts more sometimes than others; pt also c/o cough and congestion; pt reports productive cough at times with white sputum; pt denies shortness of breath

## 2016-02-01 NOTE — ED Provider Notes (Signed)
CSN: [id number]     Arrival date & time 02/01/16  2119 History   First MD Initiated Contact with Patient 02/01/16 2216     Chief Complaint  Patient presents with  . Neck Pain  . Cough     (Consider location/radiation/quality/duration/timing/severity/associated sxs/prior Treatment) Patient is a 75 y.o. male presenting with neck pain and cough. The history is provided by the patient and a relative.  Neck Pain Associated symptoms: no chest pain, no fever, no headaches, no numbness and no weakness   Cough Associated symptoms: no chest pain, no fever, no headaches, no rash, no shortness of breath and no sore throat   Patient c/o occasionally productive, episodic cough in the past 3 days. Cough persistent w occ small amt clear to yellowish phlegm.  Mild nasal congestion. No sore throat. Denies chest pain or discomfort. No leg pain or swelling. No orthopnea or pnd.  Also states that 3 days ago fell due to left leg/hip giving way, which it occasionally does since remote hx cva.  States c/o left lateral neck/trapezius area soreness since. Mild. No radicular pain. No midline neck or back pain. Hit head. No loc. No severe headaches or nv since. Denies change in speech or vision. No numbness/weakness. No change in normal/baseline functional ability since fall. No syncope. Denies faintness or dizziness. No anticoag use. No fever or chills.         Past Medical History  Diagnosis Date  . S/P lumbar fusion   . Hypertension   . Diabetes mellitus   . Cataract   . Stroke (HCC)   . Anemia, unspecified 06/08/2013   Past Surgical History  Procedure Laterality Date  . Cataract extraction    . Knee surgery    . Spine surgery    . Eye surgery    . Neck surgery    . Tonsilectomy, adenoidectomy, bilateral myringotomy and tubes     Family History  Problem Relation Age of Onset  . Stroke Mother    Social History  Substance Use Topics  . Smoking status: Former Games developer  . Smokeless tobacco: Former  Neurosurgeon  . Alcohol Use: No    Review of Systems  Constitutional: Negative for fever.  HENT: Negative for sore throat.   Eyes: Negative for visual disturbance.  Respiratory: Positive for cough. Negative for shortness of breath.   Cardiovascular: Negative for chest pain.  Gastrointestinal: Negative for vomiting, abdominal pain and diarrhea.  Genitourinary: Negative for flank pain.  Musculoskeletal: Positive for neck pain. Negative for back pain.  Skin: Negative for rash.  Neurological: Negative for weakness, numbness and headaches.  Hematological: Does not bruise/bleed easily.  Psychiatric/Behavioral: Negative for confusion.      Allergies  Lisinopril  Home Medications   Prior to Admission medications   Medication Sig Start Date End Date Taking? Authorizing Provider  amLODipine (NORVASC) 10 MG tablet Take 10 mg by mouth every morning.  12/19/11  Yes Historical Provider, MD  aspirin 325 MG tablet Take 325 mg by mouth daily.   Yes Historical Provider, MD  cloNIDine (CATAPRES - DOSED IN MG/24 HR) 0.3 mg/24hr Place 1 patch onto the skin once a week.  11/08/11  Yes Historical Provider, MD  colchicine 0.6 MG tablet Take 0.6 mg by mouth daily.   Yes Historical Provider, MD  gabapentin (NEURONTIN) 300 MG capsule Take 300 mg by mouth 3 (three) times daily.   Yes Historical Provider, MD  oxyCODONE-acetaminophen (ROXICET) 5-325 MG tablet Take 1 tablet by mouth every 6 (six)  hours as needed for severe pain. 11/30/15  Yes Gilda Creasehristopher J Pollina, MD  pravastatin (PRAVACHOL) 10 MG tablet Take 10 mg by mouth daily as needed (high cholesterol).   Yes Historical Provider, MD  PRESCRIPTION MEDICATION Apply 1 application topically 3 (three) times daily as needed (arthritis). Compound medication for arthritis   Yes Historical Provider, MD  ranitidine (ZANTAC) 300 MG tablet Take 300 mg by mouth 2 (two) times daily as needed for heartburn.  12/27/15  Yes Historical Provider, MD  terbinafine (LAMISIL) 250 MG  tablet Take 250 mg by mouth at bedtime.    Yes Historical Provider, MD  triamterene-hydrochlorothiazide (MAXZIDE-25) 37.5-25 MG per tablet Take 1 tablet by mouth every morning.  12/19/11  Yes Historical Provider, MD  methylPREDNISolone (MEDROL DOSEPAK) 4 MG TBPK tablet As directed Patient not taking: Reported on 12/03/2015 11/30/15   Gilda Creasehristopher J Pollina, MD   BP 165/95 mmHg  Pulse 75  Temp(Src) 97.7 F (36.5 C) (Oral)  Resp 18  Ht 5\' 9"  (1.753 m)  Wt 104.781 kg  BMI 34.10 kg/m2  SpO2 98% Physical Exam  Constitutional: He is oriented to person, place, and time. He appears well-developed and well-nourished. No distress.  HENT:  Head: Atraumatic.  Mouth/Throat: Oropharynx is clear and moist.  Eyes: Conjunctivae and EOM are normal. Pupils are equal, round, and reactive to light. No scleral icterus.  Neck: Neck supple. No tracheal deviation present.  Cardiovascular: Normal rate, regular rhythm, normal heart sounds and intact distal pulses.  Exam reveals no gallop and no friction rub.   No murmur heard. Pulmonary/Chest: Effort normal and breath sounds normal. No accessory muscle usage. No respiratory distress. He exhibits no tenderness.  Prod cough, upper resp congestion on exam.   Abdominal: Soft. Bowel sounds are normal. He exhibits no distension. There is no tenderness.  Musculoskeletal: Normal range of motion. He exhibits no tenderness.  CTLS spine, non tender, aligned, no step off.  Left lateral neck and trapezius muscular tenderness.   Good rom bil ext without pain or focal bony tenderness.   Neurological: He is alert and oriented to person, place, and time.  Speech clear/fluent. Motor intact bil.   Skin: Skin is warm and dry.  Psychiatric: He has a normal mood and affect.  Nursing note and vitals reviewed.   ED Course  Procedures (including critical care time)    I have personally reviewed and evaluated these images as part of my medical decision-making.    MDM    Cxr.  Reviewed nursing notes and prior charts for additional history.   ?bronchitis vs early pna on exam. No def infiltrate on cxr.  Will rx.   Recheck no increased wob.    Patient appears stable for d/c.   Rec close pcp f/u.  Return precautions provided.        Cathren LaineKevin Renie Stelmach, MD 02/01/16 [id number]

## 2016-02-01 NOTE — Discharge Instructions (Signed)
It was our pleasure to provide your ER care today - we hope that you feel better.  Rest. Drink adequate fluids.  Take doxycycline as prescribed.  You may take tylenol/advil as need for pain.  Follow up with primary care doctor in the coming week if symptoms fail to improve/resolve.  Also follow up with your doctor in the next couple weeks for your blood pressure, as it is high tonight.   Return to ER if worse, new symptoms, fevers, trouble breathing, chest pain, weak/faint, other concern.      Cough, Adult Coughing is a reflex that clears your throat and your airways. Coughing helps to heal and protect your lungs. It is normal to cough occasionally, but a cough that happens with other symptoms or lasts a long time may be a sign of a condition that needs treatment. A cough may last only 2-3 weeks (acute), or it may last longer than 8 weeks (chronic). CAUSES Coughing is commonly caused by:  Breathing in substances that irritate your lungs.  A viral or bacterial respiratory infection.  Allergies.  Asthma.  Postnasal drip.  Smoking.  Acid backing up from the stomach into the esophagus (gastroesophageal reflux).  Certain medicines.  Chronic lung problems, including COPD (or rarely, lung cancer).  Other medical conditions such as heart failure. HOME CARE INSTRUCTIONS  Pay attention to any changes in your symptoms. Take these actions to help with your discomfort:  Take medicines only as told by your health care provider.  If you were prescribed an antibiotic medicine, take it as told by your health care provider. Do not stop taking the antibiotic even if you start to feel better.  Talk with your health care provider before you take a cough suppressant medicine.  Drink enough fluid to keep your urine clear or pale yellow.  If the air is dry, use a cold steam vaporizer or humidifier in your bedroom or your home to help loosen secretions.  Avoid anything that causes you to  cough at work or at home.  If your cough is worse at night, try sleeping in a semi-upright position.  Avoid cigarette smoke. If you smoke, quit smoking. If you need help quitting, ask your health care provider.  Avoid caffeine.  Avoid alcohol.  Rest as needed. SEEK MEDICAL CARE IF:   You have new symptoms.  You cough up pus.  Your cough does not get better after 2-3 weeks, or your cough gets worse.  You cannot control your cough with suppressant medicines and you are losing sleep.  You develop pain that is getting worse or pain that is not controlled with pain medicines.  You have a fever.  You have unexplained weight loss.  You have night sweats. SEEK IMMEDIATE MEDICAL CARE IF:  You cough up blood.  You have difficulty breathing.  Your heartbeat is very fast.   This information is not intended to replace advice given to you by your health care provider. Make sure you discuss any questions you have with your health care provider.   Document Released: 02/01/2011 Document Revised: 04/26/2015 Document Reviewed: 10/12/2014 Elsevier Interactive Patient Education 2016 [employer]    Cervical Sprain A cervical sprain is an injury in the neck in which the strong, fibrous tissues (ligaments) that connect your neck bones stretch or tear. Cervical sprains can range from mild to severe. Severe cervical sprains can cause the neck vertebrae to be unstable. This can lead to damage of the spinal cord and can result in  serious nervous system problems. The amount of time it takes for a cervical sprain to get better depends on the cause and extent of the injury. Most cervical sprains heal in 1 to 3 weeks. CAUSES  Severe cervical sprains may be caused by:   Contact sport injuries (such as from football, rugby, wrestling, hockey, auto racing, gymnastics, diving, martial arts, or boxing).   Motor vehicle collisions.   Whiplash injuries. This is an injury from a sudden forward and  backward whipping movement of the head and neck.  Falls.  Mild cervical sprains may be caused by:   Being in an awkward position, such as while cradling a telephone between your ear and shoulder.   Sitting in a chair that does not offer proper support.   Working at a poorly Marketing executivedesigned computer station.   Looking up or down for long periods of time.  SYMPTOMS   Pain, soreness, stiffness, or a burning sensation in the front, back, or sides of the neck. This discomfort may develop immediately after the injury or slowly, 24 hours or more after the injury.   Pain or tenderness directly in the middle of the back of the neck.   Shoulder or upper back pain.   Limited ability to move the neck.   Headache.   Dizziness.   Weakness, numbness, or tingling in the hands or arms.   Muscle spasms.   Difficulty swallowing or chewing.   Tenderness and swelling of the neck.  DIAGNOSIS  Most of the time your health care provider can diagnose a cervical sprain by taking your history and doing a physical exam. Your health care provider will ask about previous neck injuries and any known neck problems, such as arthritis in the neck. X-rays may be taken to find out if there are any other problems, such as with the bones of the neck. Other tests, such as a CT scan or MRI, may also be needed.  TREATMENT  Treatment depends on the severity of the cervical sprain. Mild sprains can be treated with rest, keeping the neck in place (immobilization), and pain medicines. Severe cervical sprains are immediately immobilized. Further treatment is done to help with pain, muscle spasms, and other symptoms and may include:  Medicines, such as pain relievers, numbing medicines, or muscle relaxants.   Physical therapy. This may involve stretching exercises, strengthening exercises, and posture training. Exercises and improved posture can help stabilize the neck, strengthen muscles, and help stop symptoms  from returning.  HOME CARE INSTRUCTIONS   Put ice on the injured area.   Put ice in a plastic bag.   Place a towel between your skin and the bag.   Leave the ice on for 15-20 minutes, 3-4 times a day.   If your injury was severe, you may have been given a cervical collar to wear. A cervical collar is a two-piece collar designed to keep your neck from moving while it heals.  Do not remove the collar unless instructed by your health care provider.  If you have long hair, keep it outside of the collar.  Ask your health care provider before making any adjustments to your collar. Minor adjustments may be required over time to improve comfort and reduce pressure on your chin or on the back of your head.  Ifyou are allowed to remove the collar for cleaning or bathing, follow your health care provider's instructions on how to do so safely.  Keep your collar clean by wiping it with mild soap  and water and drying it completely. If the collar you have been given includes removable pads, remove them every 1-2 days and hand wash them with soap and water. Allow them to air dry. They should be completely dry before you wear them in the collar.  If you are allowed to remove the collar for cleaning and bathing, wash and dry the skin of your neck. Check your skin for irritation or sores. If you see any, tell your health care provider.  Do not drive while wearing the collar.   Only take over-the-counter or prescription medicines for pain, discomfort, or fever as directed by your health care provider.   Keep all follow-up appointments as directed by your health care provider.   Keep all physical therapy appointments as directed by your health care provider.   Make any needed adjustments to your workstation to promote good posture.   Avoid positions and activities that make your symptoms worse.   Warm up and stretch before being active to help prevent problems.  SEEK MEDICAL CARE IF:     Your pain is not controlled with medicine.   You are unable to decrease your pain medicine over time as planned.   Your activity level is not improving as expected.  SEEK IMMEDIATE MEDICAL CARE IF:   You develop any bleeding.  You develop stomach upset.  You have signs of an allergic reaction to your medicine.   Your symptoms get worse.   You develop new, unexplained symptoms.   You have numbness, tingling, weakness, or paralysis in any part of your body.  MAKE SURE YOU:   Understand these instructions.  Will watch your condition.  Will get help right away if you are not doing well or get worse.   This information is not intended to replace advice given to you by your health care provider. Make sure you discuss any questions you have with your health care provider.   Document Released: 06/02/2007 Document Revised: 08/10/2013 Document Reviewed: 02/10/2013 Elsevier Interactive Patient Education [employer]

## 2016-02-08 DIAGNOSIS — K210 Gastro-esophageal reflux disease with esophagitis: Secondary | ICD-10-CM | POA: Diagnosis not present

## 2016-02-08 DIAGNOSIS — J449 Chronic obstructive pulmonary disease, unspecified: Secondary | ICD-10-CM | POA: Diagnosis not present

## 2016-02-08 DIAGNOSIS — E089 Diabetes mellitus due to underlying condition without complications: Secondary | ICD-10-CM | POA: Diagnosis not present

## 2016-02-08 DIAGNOSIS — I10 Essential (primary) hypertension: Secondary | ICD-10-CM | POA: Diagnosis not present

## 2016-02-14 ENCOUNTER — Emergency Department (HOSPITAL_COMMUNITY)
Admission: EM | Admit: 2016-02-14 | Discharge: 2016-02-15 | Disposition: A | Discharge status: Home / Self Care | Payer: Medicare Other | Attending: Emergency Medicine | Admitting: Emergency Medicine

## 2016-02-14 ENCOUNTER — Encounter (HOSPITAL_COMMUNITY): Payer: Self-pay | Admitting: *Deleted

## 2016-02-14 DIAGNOSIS — Y92009 Unspecified place in unspecified non-institutional (private) residence as the place of occurrence of the external cause: Secondary | ICD-10-CM | POA: Insufficient documentation

## 2016-02-14 DIAGNOSIS — E119 Type 2 diabetes mellitus without complications: Secondary | ICD-10-CM | POA: Insufficient documentation

## 2016-02-14 DIAGNOSIS — Z8673 Personal history of transient ischemic attack (TIA), and cerebral infarction without residual deficits: Secondary | ICD-10-CM | POA: Insufficient documentation

## 2016-02-14 DIAGNOSIS — W1839XA Other fall on same level, initial encounter: Secondary | ICD-10-CM | POA: Insufficient documentation

## 2016-02-14 DIAGNOSIS — Z87891 Personal history of nicotine dependence: Secondary | ICD-10-CM | POA: Diagnosis not present

## 2016-02-14 DIAGNOSIS — Z79899 Other long term (current) drug therapy: Secondary | ICD-10-CM | POA: Diagnosis not present

## 2016-02-14 DIAGNOSIS — S7002XA Contusion of left hip, initial encounter: Secondary | ICD-10-CM | POA: Insufficient documentation

## 2016-02-14 DIAGNOSIS — Y9301 Activity, walking, marching and hiking: Secondary | ICD-10-CM | POA: Diagnosis not present

## 2016-02-14 DIAGNOSIS — Z7982 Long term (current) use of aspirin: Secondary | ICD-10-CM | POA: Diagnosis not present

## 2016-02-14 DIAGNOSIS — S79912A Unspecified injury of left hip, initial encounter: Secondary | ICD-10-CM | POA: Diagnosis not present

## 2016-02-14 DIAGNOSIS — M25552 Pain in left hip: Secondary | ICD-10-CM | POA: Diagnosis present

## 2016-02-14 DIAGNOSIS — Y999 Unspecified external cause status: Secondary | ICD-10-CM | POA: Insufficient documentation

## 2016-02-14 DIAGNOSIS — W19XXXA Unspecified fall, initial encounter: Secondary | ICD-10-CM

## 2016-02-14 DIAGNOSIS — I10 Essential (primary) hypertension: Secondary | ICD-10-CM | POA: Diagnosis not present

## 2016-02-14 NOTE — ED Notes (Signed)
Pt states that she fell Monday; pt states that he has had a CVA and has left sided weakness and falls sometimes; pt c/o lower back, left hip and leg pain; pt states that his left leg and hip are burning; pt states that his left hip hurts the most; pt has been ambulatory with a walker but states that it hurts

## 2016-02-15 ENCOUNTER — Emergency Department (HOSPITAL_COMMUNITY): Payer: Medicare Other

## 2016-02-15 DIAGNOSIS — S79912A Unspecified injury of left hip, initial encounter: Secondary | ICD-10-CM | POA: Diagnosis not present

## 2016-02-15 MED ORDER — OXYCODONE-ACETAMINOPHEN 5-325 MG PO TABS: 1.0000 | ORAL_TABLET | Freq: Four times a day (QID) | ORAL | Status: DC | PRN

## 2016-02-15 NOTE — Discharge Instructions (Signed)
Contusion A contusion is a deep bruise. Contusions are the result of a blunt injury to tissues and muscle fibers under the skin. The injury causes bleeding under the skin. The skin overlying the contusion may turn blue, purple, or yellow. Minor injuries will give you a painless contusion, but more severe contusions may stay painful and swollen for a few weeks.  CAUSES  This condition is usually caused by a blow, trauma, or direct force to an area of the body. SYMPTOMS  Symptoms of this condition include:  Swelling of the injured area.  Pain and tenderness in the injured area.  Discoloration. The area may have redness and then turn blue, purple, or yellow. DIAGNOSIS  This condition is diagnosed based on a physical exam and medical history. An X-ray, CT scan, or MRI may be needed to determine if there are any associated injuries, such as broken bones (fractures). TREATMENT  Specific treatment for this condition depends on what area of the body was injured. In general, the best treatment for a contusion is resting, icing, applying pressure to (compression), and elevating the injured area. This is often called the RICE strategy. Over-the-counter anti-inflammatory medicines may also be recommended for pain control.  HOME CARE INSTRUCTIONS   Rest the injured area.  If directed, apply ice to the injured area:  Put ice in a plastic bag.  Place a towel between your skin and the bag.  Leave the ice on for 20 minutes, 2-3 times per day.  If directed, apply light compression to the injured area using an elastic bandage. Make sure the bandage is not wrapped too tightly. Remove and reapply the bandage as directed by your health care provider.  If possible, raise (elevate) the injured area above the level of your heart while you are sitting or lying down.  Take over-the-counter and prescription medicines only as told by your health care provider. SEEK MEDICAL CARE IF:  Your symptoms do not  improve after several days of treatment.  Your symptoms get worse.  You have difficulty moving the injured area. SEEK IMMEDIATE MEDICAL CARE IF:   You have severe pain.  You have numbness in a hand or foot.  Your hand or foot turns pale or cold.   This information is not intended to replace advice given to you by your health care provider. Make sure you discuss any questions you have with your health care provider.   Document Released: 05/15/2005 Document Revised: 04/26/2015 Document Reviewed: 12/21/2014 Elsevier Interactive Patient Education 2016 [employer] Fall Prevention in the Home  Falls can cause injuries and can affect people from all age groups. There are many simple things that you can do to make your home safe and to help prevent falls. WHAT CAN I DO ON THE OUTSIDE OF MY HOME?  Regularly repair the edges of walkways and driveways and fix any cracks.  Remove high doorway thresholds.  Trim any shrubbery on the main path into your home.  Use bright outdoor lighting.  Clear walkways of debris and clutter, including tools and rocks.  Regularly check that handrails are securely fastened and in good repair. Both sides of any steps should have handrails.  Install guardrails along the edges of any raised decks or porches.  Have leaves, snow, and ice cleared regularly.  Use sand or salt on walkways during winter months.  In the garage, clean up any spills right away, including grease or oil spills. WHAT CAN I DO IN THE BATHROOM?  Use night lights.    Install grab bars by the toilet and in the tub and shower. Do not use towel bars as grab bars.  Use non-skid mats or decals on the floor of the tub or shower.  If you need to sit down while you are in the shower, use a plastic, non-slip stool..  Keep the floor dry. Immediately clean up any water that spills on the floor.  Remove soap buildup in the tub or shower on a regular basis.  Attach bath mats securely with  double-sided non-slip rug tape.  Remove throw rugs and other tripping hazards from the floor. WHAT CAN I DO IN THE BEDROOM?  Use night lights.  Make sure that a bedside light is easy to reach.  Do not use oversized bedding that drapes onto the floor.  Have a firm chair that has side arms to use for getting dressed.  Remove throw rugs and other tripping hazards from the floor. WHAT CAN I DO IN THE KITCHEN?   Clean up any spills right away.  Avoid walking on wet floors.  Place frequently used items in easy-to-reach places.  If you need to reach for something above you, use a sturdy step stool that has a grab bar.  Keep electrical cables out of the way.  Do not use floor polish or wax that makes floors slippery. If you have to use wax, make sure that it is non-skid floor wax.  Remove throw rugs and other tripping hazards from the floor. WHAT CAN I DO IN THE STAIRWAYS?  Do not leave any items on the stairs.  Make sure that there are handrails on both sides of the stairs. Fix handrails that are broken or loose. Make sure that handrails are as long as the stairways.  Check any carpeting to make sure that it is firmly attached to the stairs. Fix any carpet that is loose or worn.  Avoid having throw rugs at the top or bottom of stairways, or secure the rugs with carpet tape to prevent them from moving.  Make sure that you have a light switch at the top of the stairs and the bottom of the stairs. If you do not have them, have them installed. WHAT ARE SOME OTHER FALL PREVENTION TIPS?  Wear closed-toe shoes that fit well and support your feet. Wear shoes that have rubber soles or low heels.  When you use a stepladder, make sure that it is completely opened and that the sides are firmly locked. Have someone hold the ladder while you are using it. Do not climb a closed stepladder.  Add color or contrast paint or tape to grab bars and handrails in your home. Place contrasting color  strips on the first and last steps.  Use mobility aids as needed, such as canes, walkers, scooters, and crutches.  Turn on lights if it is dark. Replace any light bulbs that burn out.  Set up furniture so that there are clear paths. Keep the furniture in the same spot.  Fix any uneven floor surfaces.  Choose a carpet design that does not hide the edge of steps of a stairway.  Be aware of any and all pets.  Review your medicines with your healthcare provider. Some medicines can cause dizziness or changes in blood pressure, which increase your risk of falling. Talk with your health care provider about other ways that you can decrease your risk of falls. This may include working with a physical therapist or trainer to improve your strength, balance, and endurance.     This information is not intended to replace advice given to you by your health care provider. Make sure you discuss any questions you have with your health care provider.   Document Released: 07/26/2002 Document Revised: 12/20/2014 Document Reviewed: 09/09/2014 Elsevier Interactive Patient Education 2016 [employer] Inc[employer]  
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
## 2016-02-29 DIAGNOSIS — I10 Essential (primary) hypertension: Secondary | ICD-10-CM | POA: Diagnosis not present

## 2016-02-29 DIAGNOSIS — E0800 Diabetes mellitus due to underlying condition with hyperosmolarity without nonketotic hyperglycemic-hyperosmolar coma (NKHHC): Secondary | ICD-10-CM | POA: Diagnosis not present

## 2016-02-29 DIAGNOSIS — J449 Chronic obstructive pulmonary disease, unspecified: Secondary | ICD-10-CM | POA: Diagnosis not present

## 2016-03-05 DIAGNOSIS — M21372 Foot drop, left foot: Secondary | ICD-10-CM | POA: Diagnosis not present

## 2016-03-11 ENCOUNTER — Emergency Department (HOSPITAL_COMMUNITY): Payer: Medicare Other

## 2016-03-11 ENCOUNTER — Encounter (HOSPITAL_COMMUNITY): Payer: Self-pay | Admitting: *Deleted

## 2016-03-11 ENCOUNTER — Emergency Department (HOSPITAL_COMMUNITY)
Admission: EM | Admit: 2016-03-11 | Discharge: 2016-03-12 | Disposition: A | Discharge status: Home / Self Care | Payer: Medicare Other | Attending: Emergency Medicine | Admitting: Emergency Medicine

## 2016-03-11 DIAGNOSIS — Y939 Activity, unspecified: Secondary | ICD-10-CM | POA: Diagnosis not present

## 2016-03-11 DIAGNOSIS — W1830XA Fall on same level, unspecified, initial encounter: Secondary | ICD-10-CM | POA: Insufficient documentation

## 2016-03-11 DIAGNOSIS — E1140 Type 2 diabetes mellitus with diabetic neuropathy, unspecified: Secondary | ICD-10-CM | POA: Diagnosis not present

## 2016-03-11 DIAGNOSIS — Y929 Unspecified place or not applicable: Secondary | ICD-10-CM | POA: Diagnosis not present

## 2016-03-11 DIAGNOSIS — Z7982 Long term (current) use of aspirin: Secondary | ICD-10-CM | POA: Insufficient documentation

## 2016-03-11 DIAGNOSIS — I130 Hypertensive heart and chronic kidney disease with heart failure and stage 1 through stage 4 chronic kidney disease, or unspecified chronic kidney disease: Secondary | ICD-10-CM | POA: Insufficient documentation

## 2016-03-11 DIAGNOSIS — E1122 Type 2 diabetes mellitus with diabetic chronic kidney disease: Secondary | ICD-10-CM | POA: Diagnosis not present

## 2016-03-11 DIAGNOSIS — Z87891 Personal history of nicotine dependence: Secondary | ICD-10-CM | POA: Diagnosis not present

## 2016-03-11 DIAGNOSIS — M25551 Pain in right hip: Secondary | ICD-10-CM | POA: Diagnosis not present

## 2016-03-11 DIAGNOSIS — Y999 Unspecified external cause status: Secondary | ICD-10-CM | POA: Insufficient documentation

## 2016-03-11 DIAGNOSIS — I5032 Chronic diastolic (congestive) heart failure: Secondary | ICD-10-CM | POA: Insufficient documentation

## 2016-03-11 DIAGNOSIS — W19XXXA Unspecified fall, initial encounter: Secondary | ICD-10-CM

## 2016-03-11 DIAGNOSIS — N183 Chronic kidney disease, stage 3 (moderate): Secondary | ICD-10-CM | POA: Insufficient documentation
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
## 2016-03-11 NOTE — ED Triage Notes (Signed)
Pt states he fell onto right hip two days ago. Pt reports increased pain since. Pt uses a walker to ambulate. Pt is not on blood thinners, pt denies LOC, pt denies hitting head. Pt only complains of right hip pain.

## 2016-03-11 NOTE — Discharge Instructions (Signed)
Treatment: Take Tylenol as prescribed over-the-counter for your pain. Ice the area 3-4 times daily alternating 20 minutes on, 20 minutes off.  Follow-up: Please follow-up with your primary care provider in 2-3 days for follow-up of today's visit and recheck of your blood pressure. Please return to emergency department if you develop any new or worsening symptoms.

## 2016-03-21 DIAGNOSIS — J441 Chronic obstructive pulmonary disease with (acute) exacerbation: Secondary | ICD-10-CM | POA: Diagnosis not present

## 2016-03-21 DIAGNOSIS — I10 Essential (primary) hypertension: Secondary | ICD-10-CM | POA: Diagnosis not present

## 2016-03-21 DIAGNOSIS — E559 Vitamin D deficiency, unspecified: Secondary | ICD-10-CM | POA: Diagnosis not present

## 2016-03-21 DIAGNOSIS — I69331 Monoplegia of upper limb following cerebral infarction affecting right dominant side: Secondary | ICD-10-CM | POA: Diagnosis not present

## 2016-03-21 DIAGNOSIS — E0840 Diabetes mellitus due to underlying condition with diabetic neuropathy, unspecified: Secondary | ICD-10-CM | POA: Diagnosis not present

## 2016-03-28 DIAGNOSIS — I10 Essential (primary) hypertension: Secondary | ICD-10-CM | POA: Diagnosis not present

## 2016-04-16 DIAGNOSIS — G8929 Other chronic pain: Secondary | ICD-10-CM | POA: Diagnosis not present

## 2016-04-16 DIAGNOSIS — M1711 Unilateral primary osteoarthritis, right knee: Secondary | ICD-10-CM | POA: Diagnosis not present

## 2016-04-16 DIAGNOSIS — M25462 Effusion, left knee: Secondary | ICD-10-CM | POA: Diagnosis not present

## 2016-04-16 DIAGNOSIS — Z96652 Presence of left artificial knee joint: Secondary | ICD-10-CM | POA: Diagnosis not present

## 2016-04-16 DIAGNOSIS — M25562 Pain in left knee: Secondary | ICD-10-CM | POA: Diagnosis not present

## 2016-04-16 DIAGNOSIS — M545 Low back pain: Secondary | ICD-10-CM | POA: Diagnosis not present

## 2016-04-16 DIAGNOSIS — M25561 Pain in right knee: Secondary | ICD-10-CM | POA: Diagnosis not present

## 2016-04-18 DIAGNOSIS — I69339 Monoplegia of upper limb following cerebral infarction affecting unspecified side: Secondary | ICD-10-CM | POA: Diagnosis not present

## 2016-04-18 DIAGNOSIS — I10 Essential (primary) hypertension: Secondary | ICD-10-CM | POA: Diagnosis not present

## 2016-04-18 DIAGNOSIS — E089 Diabetes mellitus due to underlying condition without complications: Secondary | ICD-10-CM | POA: Diagnosis not present

## 2016-04-19 DIAGNOSIS — I69339 Monoplegia of upper limb following cerebral infarction affecting unspecified side: Secondary | ICD-10-CM | POA: Diagnosis not present

## 2016-04-19 DIAGNOSIS — I10 Essential (primary) hypertension: Secondary | ICD-10-CM | POA: Diagnosis not present

## 2016-04-19 DIAGNOSIS — E089 Diabetes mellitus due to underlying condition without complications: Secondary | ICD-10-CM | POA: Diagnosis not present

## 2016-04-19 DIAGNOSIS — J441 Chronic obstructive pulmonary disease with (acute) exacerbation: Secondary | ICD-10-CM | POA: Diagnosis not present

## 2016-04-23 DIAGNOSIS — E1142 Type 2 diabetes mellitus with diabetic polyneuropathy: Secondary | ICD-10-CM | POA: Diagnosis not present

## 2016-04-28 IMAGING — CR DG HIP (WITH OR WITHOUT PELVIS) 2-3V*L*
2 series · 2 of 2 positions shown · non-contrast
Comparison: January 16, 2014.

CLINICAL DATA: Left hip pain.

EXAM:
LEFT HIP - COMPLETE 2+ VIEW

[t hip ap left]
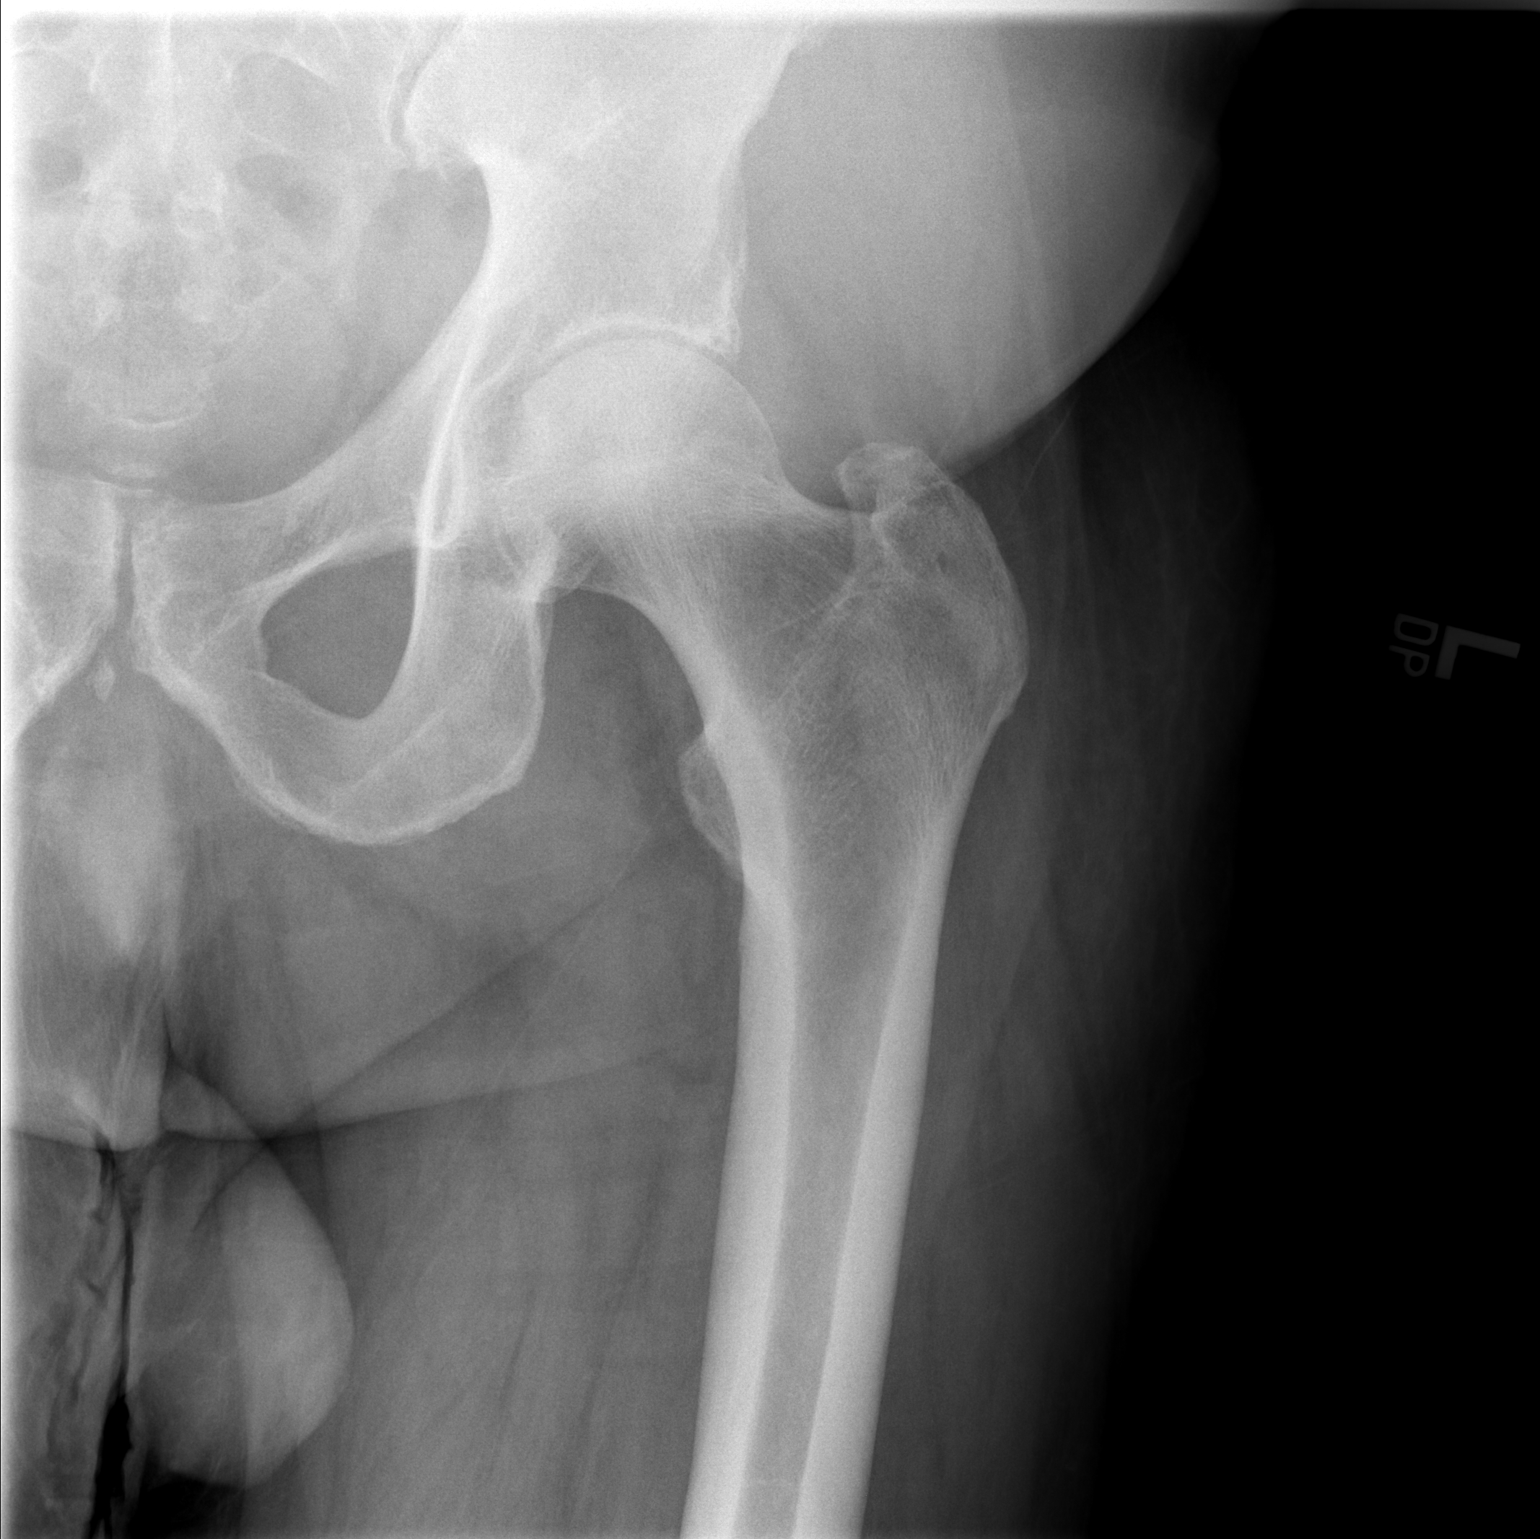

[t hip frog leg left]
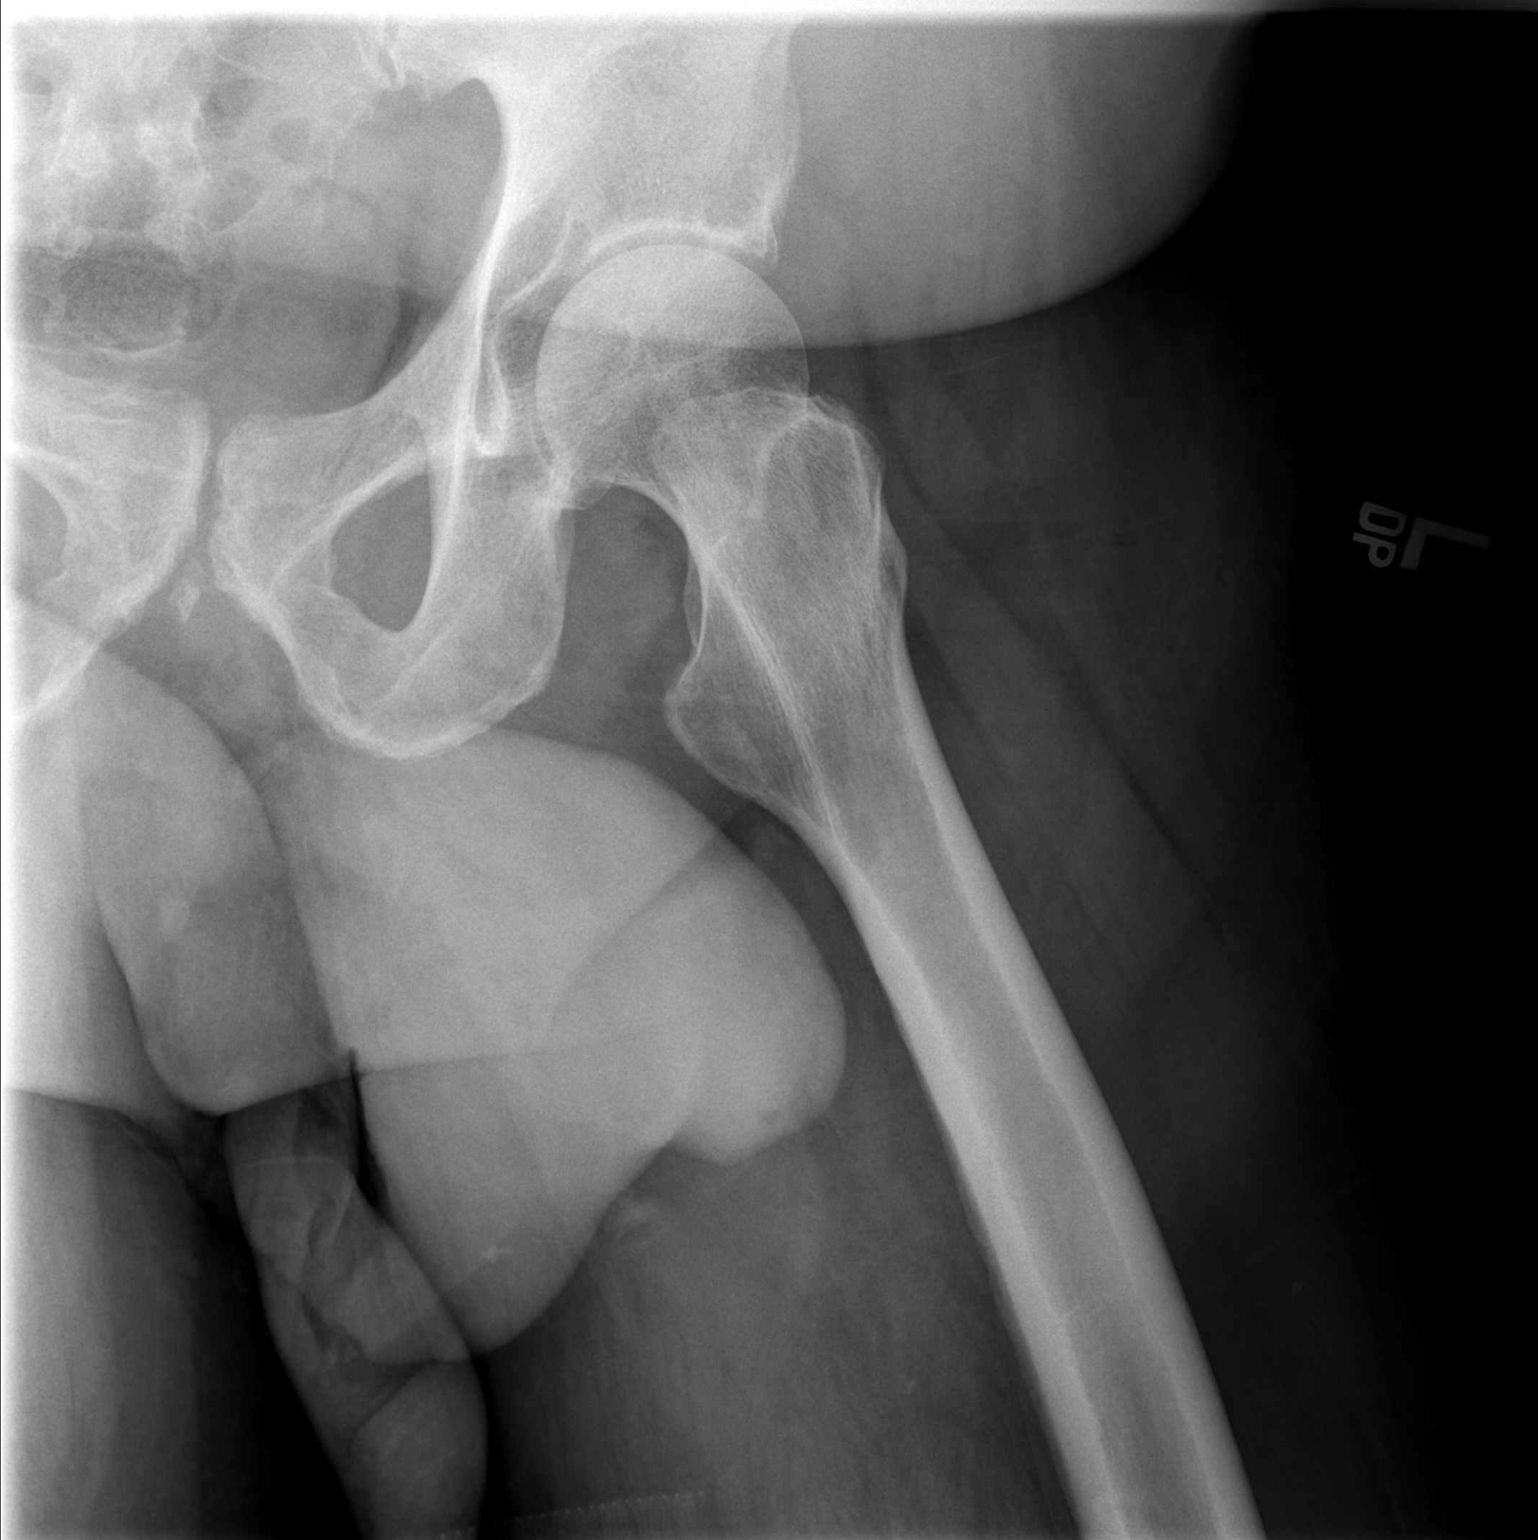

[2 of 2 positions shown; findings below may reference images not displayed]

FINDINGS: There is no evidence of hip fracture or dislocation. Mild narrowing
of left hip joint is noted consistent with degenerative joint
disease.
IMPRESSION: Mild degenerative joint disease of the left hip. No acute
abnormality seen.

## 2016-05-14 DIAGNOSIS — I69331 Monoplegia of upper limb following cerebral infarction affecting right dominant side: Secondary | ICD-10-CM | POA: Diagnosis not present

## 2016-05-14 DIAGNOSIS — I10 Essential (primary) hypertension: Secondary | ICD-10-CM | POA: Diagnosis not present

## 2016-05-30 DIAGNOSIS — Z7982 Long term (current) use of aspirin: Secondary | ICD-10-CM | POA: Diagnosis not present

## 2016-05-30 DIAGNOSIS — Z888 Allergy status to other drugs, medicaments and biological substances status: Secondary | ICD-10-CM | POA: Diagnosis not present

## 2016-05-30 DIAGNOSIS — I129 Hypertensive chronic kidney disease with stage 1 through stage 4 chronic kidney disease, or unspecified chronic kidney disease: Secondary | ICD-10-CM | POA: Diagnosis not present

## 2016-05-30 DIAGNOSIS — M5440 Lumbago with sciatica, unspecified side: Secondary | ICD-10-CM | POA: Diagnosis not present

## 2016-05-30 DIAGNOSIS — Z79899 Other long term (current) drug therapy: Secondary | ICD-10-CM | POA: Diagnosis not present

## 2016-05-30 DIAGNOSIS — Z87891 Personal history of nicotine dependence: Secondary | ICD-10-CM | POA: Diagnosis not present

## 2016-05-30 DIAGNOSIS — N189 Chronic kidney disease, unspecified: Secondary | ICD-10-CM | POA: Diagnosis not present

## 2016-05-30 DIAGNOSIS — E1122 Type 2 diabetes mellitus with diabetic chronic kidney disease: Secondary | ICD-10-CM | POA: Diagnosis not present

## 2016-05-30 DIAGNOSIS — Z981 Arthrodesis status: Secondary | ICD-10-CM | POA: Diagnosis not present

## 2016-05-30 DIAGNOSIS — W19XXXA Unspecified fall, initial encounter: Secondary | ICD-10-CM | POA: Diagnosis not present

## 2016-05-30 DIAGNOSIS — R29898 Other symptoms and signs involving the musculoskeletal system: Secondary | ICD-10-CM | POA: Diagnosis not present

## 2016-05-30 DIAGNOSIS — M545 Low back pain: Secondary | ICD-10-CM | POA: Diagnosis not present

## 2016-05-30 DIAGNOSIS — Z8673 Personal history of transient ischemic attack (TIA), and cerebral infarction without residual deficits: Secondary | ICD-10-CM | POA: Diagnosis not present

## 2016-05-30 DIAGNOSIS — G8929 Other chronic pain: Secondary | ICD-10-CM | POA: Diagnosis not present

## 2016-06-13 DIAGNOSIS — I69339 Monoplegia of upper limb following cerebral infarction affecting unspecified side: Secondary | ICD-10-CM | POA: Diagnosis not present

## 2016-06-13 DIAGNOSIS — E089 Diabetes mellitus due to underlying condition without complications: Secondary | ICD-10-CM | POA: Diagnosis not present

## 2016-06-13 DIAGNOSIS — I10 Essential (primary) hypertension: Secondary | ICD-10-CM | POA: Diagnosis not present

## 2016-06-17 DIAGNOSIS — W19XXXA Unspecified fall, initial encounter: Secondary | ICD-10-CM | POA: Diagnosis not present

## 2016-06-17 DIAGNOSIS — M545 Low back pain: Secondary | ICD-10-CM | POA: Diagnosis not present

## 2016-06-17 DIAGNOSIS — M48061 Spinal stenosis, lumbar region without neurogenic claudication: Secondary | ICD-10-CM | POA: Diagnosis not present

## 2016-06-17 DIAGNOSIS — Z981 Arthrodesis status: Secondary | ICD-10-CM | POA: Diagnosis not present

## 2016-06-17 DIAGNOSIS — M5136 Other intervertebral disc degeneration, lumbar region: Secondary | ICD-10-CM | POA: Diagnosis not present

## 2016-07-18 DIAGNOSIS — M25551 Pain in right hip: Secondary | ICD-10-CM | POA: Diagnosis not present

## 2016-07-18 DIAGNOSIS — M5441 Lumbago with sciatica, right side: Secondary | ICD-10-CM | POA: Diagnosis not present

## 2016-07-18 DIAGNOSIS — Z8673 Personal history of transient ischemic attack (TIA), and cerebral infarction without residual deficits: Secondary | ICD-10-CM | POA: Diagnosis not present

## 2016-07-18 DIAGNOSIS — N189 Chronic kidney disease, unspecified: Secondary | ICD-10-CM | POA: Diagnosis not present

## 2016-07-18 DIAGNOSIS — M47816 Spondylosis without myelopathy or radiculopathy, lumbar region: Secondary | ICD-10-CM | POA: Diagnosis not present

## 2016-07-18 DIAGNOSIS — Z981 Arthrodesis status: Secondary | ICD-10-CM | POA: Diagnosis not present

## 2016-07-18 DIAGNOSIS — M5442 Lumbago with sciatica, left side: Secondary | ICD-10-CM | POA: Diagnosis not present

## 2016-07-18 DIAGNOSIS — G8929 Other chronic pain: Secondary | ICD-10-CM | POA: Diagnosis not present

## 2016-07-18 DIAGNOSIS — M4317 Spondylolisthesis, lumbosacral region: Secondary | ICD-10-CM | POA: Diagnosis not present

## 2016-07-18 DIAGNOSIS — I129 Hypertensive chronic kidney disease with stage 1 through stage 4 chronic kidney disease, or unspecified chronic kidney disease: Secondary | ICD-10-CM | POA: Diagnosis not present

## 2016-07-18 DIAGNOSIS — Z888 Allergy status to other drugs, medicaments and biological substances status: Secondary | ICD-10-CM | POA: Diagnosis not present

## 2016-07-18 DIAGNOSIS — E1122 Type 2 diabetes mellitus with diabetic chronic kidney disease: Secondary | ICD-10-CM | POA: Diagnosis not present

## 2016-07-18 DIAGNOSIS — Z79899 Other long term (current) drug therapy: Secondary | ICD-10-CM | POA: Diagnosis not present

## 2016-07-18 DIAGNOSIS — M5136 Other intervertebral disc degeneration, lumbar region: Secondary | ICD-10-CM | POA: Diagnosis not present

## 2016-07-18 DIAGNOSIS — Z87891 Personal history of nicotine dependence: Secondary | ICD-10-CM | POA: Diagnosis not present

## 2016-07-25 DIAGNOSIS — I10 Essential (primary) hypertension: Secondary | ICD-10-CM | POA: Diagnosis not present

## 2016-07-25 DIAGNOSIS — E118 Type 2 diabetes mellitus with unspecified complications: Secondary | ICD-10-CM | POA: Diagnosis not present

## 2016-08-26 DIAGNOSIS — E119 Type 2 diabetes mellitus without complications: Secondary | ICD-10-CM | POA: Diagnosis not present

## 2016-08-26 DIAGNOSIS — M533 Sacrococcygeal disorders, not elsewhere classified: Secondary | ICD-10-CM | POA: Diagnosis not present

## 2016-08-26 DIAGNOSIS — M545 Low back pain: Secondary | ICD-10-CM | POA: Diagnosis not present

## 2016-08-26 DIAGNOSIS — M461 Sacroiliitis, not elsewhere classified: Secondary | ICD-10-CM | POA: Diagnosis not present

## 2016-08-27 DIAGNOSIS — D649 Anemia, unspecified: Secondary | ICD-10-CM | POA: Diagnosis not present

## 2016-08-27 DIAGNOSIS — I69331 Monoplegia of upper limb following cerebral infarction affecting right dominant side: Secondary | ICD-10-CM | POA: Diagnosis not present

## 2016-08-27 DIAGNOSIS — I10 Essential (primary) hypertension: Secondary | ICD-10-CM | POA: Diagnosis not present

## 2016-08-27 DIAGNOSIS — E118 Type 2 diabetes mellitus with unspecified complications: Secondary | ICD-10-CM | POA: Diagnosis not present

## 2016-09-26 DIAGNOSIS — G8929 Other chronic pain: Secondary | ICD-10-CM | POA: Diagnosis not present

## 2016-09-26 DIAGNOSIS — Z981 Arthrodesis status: Secondary | ICD-10-CM | POA: Diagnosis not present

## 2016-09-26 DIAGNOSIS — Z87891 Personal history of nicotine dependence: Secondary | ICD-10-CM | POA: Diagnosis not present

## 2016-09-26 DIAGNOSIS — Z8673 Personal history of transient ischemic attack (TIA), and cerebral infarction without residual deficits: Secondary | ICD-10-CM | POA: Diagnosis not present

## 2016-09-26 DIAGNOSIS — N189 Chronic kidney disease, unspecified: Secondary | ICD-10-CM | POA: Diagnosis not present

## 2016-09-26 DIAGNOSIS — E1122 Type 2 diabetes mellitus with diabetic chronic kidney disease: Secondary | ICD-10-CM | POA: Diagnosis not present

## 2016-09-26 DIAGNOSIS — M533 Sacrococcygeal disorders, not elsewhere classified: Secondary | ICD-10-CM | POA: Diagnosis not present

## 2016-09-26 DIAGNOSIS — I129 Hypertensive chronic kidney disease with stage 1 through stage 4 chronic kidney disease, or unspecified chronic kidney disease: Secondary | ICD-10-CM | POA: Diagnosis not present

## 2016-09-26 DIAGNOSIS — Z79899 Other long term (current) drug therapy: Secondary | ICD-10-CM | POA: Diagnosis not present

## 2016-09-26 DIAGNOSIS — Z888 Allergy status to other drugs, medicaments and biological substances status: Secondary | ICD-10-CM | POA: Diagnosis not present

## 2016-09-26 DIAGNOSIS — Z9889 Other specified postprocedural states: Secondary | ICD-10-CM | POA: Diagnosis not present

## 2016-10-01 DIAGNOSIS — I10 Essential (primary) hypertension: Secondary | ICD-10-CM | POA: Diagnosis not present

## 2016-10-01 DIAGNOSIS — E118 Type 2 diabetes mellitus with unspecified complications: Secondary | ICD-10-CM | POA: Diagnosis not present

## 2016-10-01 DIAGNOSIS — M79673 Pain in unspecified foot: Secondary | ICD-10-CM | POA: Diagnosis not present

## 2016-10-01 DIAGNOSIS — Z0000 Encounter for general adult medical examination without abnormal findings: Secondary | ICD-10-CM | POA: Diagnosis not present

## 2016-10-24 DIAGNOSIS — M533 Sacrococcygeal disorders, not elsewhere classified: Secondary | ICD-10-CM | POA: Diagnosis not present

## 2016-10-28 ENCOUNTER — Encounter: Payer: Self-pay | Admitting: *Deleted

## 2016-10-29 ENCOUNTER — Ambulatory Visit (INDEPENDENT_AMBULATORY_CARE_PROVIDER_SITE_OTHER): Payer: Medicare Other | Admitting: Diagnostic Neuroimaging

## 2016-10-29 ENCOUNTER — Encounter: Payer: Self-pay | Admitting: Diagnostic Neuroimaging

## 2016-10-29 DIAGNOSIS — M79605 Pain in left leg: Secondary | ICD-10-CM

## 2016-10-29 DIAGNOSIS — E1142 Type 2 diabetes mellitus with diabetic polyneuropathy: Secondary | ICD-10-CM

## 2016-10-29 DIAGNOSIS — R29898 Other symptoms and signs involving the musculoskeletal system: Secondary | ICD-10-CM

## 2016-10-29 DIAGNOSIS — I6930 Unspecified sequelae of cerebral infarction: Secondary | ICD-10-CM | POA: Diagnosis not present
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
## 2016-10-29 NOTE — Patient Instructions (Addendum)
Thank you for coming to see [place] at [facility]. I hope we have been able to provide you high quality care today.  You may receive a patient satisfaction survey over the next few weeks. We would appreciate your feedback and comments so that we may continue to improve ourselves and the health of our patients.  - continue secondary stroke prevention (aspirin, blood pressure control, cholesterol control, diabetes control; follow up with Dr. Criss Rosales for treatments)  - may use limited tylenol and ibuprofen for pain control   ~~~~~~~~~~~~~~~~~~~~~~~~~~~~~~~~~~~~~~~~~~~~~~~~~~~~~~~~~~~~~~~~~  DR. PENUMALLI'S GUIDE TO HAPPY AND HEALTHY LIVING These are some of my general health and wellness recommendations. Some of them may apply to you better than others. Please use common sense as you try these suggestions and feel free to ask me any questions.   ACTIVITY/FITNESS Mental, social, emotional and physical stimulation are very important for brain and body health. Try learning a new activity (arts, music, language, sports, games).  Keep moving your body to the best of your abilities. You can do this at home, inside or outside, the park, community center, gym or anywhere you like. Consider a physical therapist or personal trainer to get started. Consider the app Sworkit. Fitness trackers such as smart-watches, smart-phones or Fitbits can help as well.   NUTRITION Eat more plants: colorful vegetables, nuts, seeds and berries.  Eat less sugar, salt, preservatives and processed foods.  Avoid toxins such as cigarettes and alcohol.  Drink water when you are thirsty. Warm water with a slice of lemon is an excellent morning drink to start the day.  Consider these websites for more information The [employer] (https://www.henry-hernandez.biz/) [employer] Nutrition (WindowBlog.ch)   RELAXATION Consider practicing mindfulness meditation or  other relaxation techniques such as deep breathing, prayer, yoga, tai chi, massage. See website mindful.org or the apps Headspace or Calm to help get started.   SLEEP Try to get at least 7-8+ hours sleep per day. Regular exercise and reduced caffeine will help you sleep better. Practice good sleep hygeine techniques. See website sleep.org for more information.   PLANNING Prepare estate planning, living will, healthcare POA documents. Sometimes this is best planned with the help of an attorney. Theconversationproject.org and agingwithdignity.org are excellent resources.

## 2016-10-31 DIAGNOSIS — I10 Essential (primary) hypertension: Secondary | ICD-10-CM | POA: Diagnosis not present

## 2016-10-31 DIAGNOSIS — G5792 Unspecified mononeuropathy of left lower limb: Secondary | ICD-10-CM | POA: Diagnosis not present

## 2016-10-31 DIAGNOSIS — E069 Thyroiditis, unspecified: Secondary | ICD-10-CM | POA: Diagnosis not present

## 2016-11-01 DIAGNOSIS — Z961 Presence of intraocular lens: Secondary | ICD-10-CM | POA: Diagnosis not present

## 2016-11-01 DIAGNOSIS — E119 Type 2 diabetes mellitus without complications: Secondary | ICD-10-CM | POA: Diagnosis not present

## 2016-11-01 DIAGNOSIS — H2511 Age-related nuclear cataract, right eye: Secondary | ICD-10-CM | POA: Diagnosis not present

## 2016-11-01 DIAGNOSIS — H353131 Nonexudative age-related macular degeneration, bilateral, early dry stage: Secondary | ICD-10-CM | POA: Diagnosis not present

## 2016-11-05 DIAGNOSIS — M1712 Unilateral primary osteoarthritis, left knee: Secondary | ICD-10-CM | POA: Diagnosis not present

## 2016-11-07 DIAGNOSIS — E119 Type 2 diabetes mellitus without complications: Secondary | ICD-10-CM | POA: Diagnosis not present

## 2016-11-21 ENCOUNTER — Encounter (HOSPITAL_COMMUNITY): Payer: Self-pay

## 2016-11-21 ENCOUNTER — Emergency Department (HOSPITAL_COMMUNITY)
Admission: EM | Admit: 2016-11-21 | Discharge: 2016-11-21 | Disposition: A | Discharge status: Home / Self Care | Payer: Medicare Other | Attending: Emergency Medicine | Admitting: Emergency Medicine

## 2016-11-21 ENCOUNTER — Emergency Department (HOSPITAL_COMMUNITY): Payer: Medicare Other

## 2016-11-21 DIAGNOSIS — Z8673 Personal history of transient ischemic attack (TIA), and cerebral infarction without residual deficits: Secondary | ICD-10-CM | POA: Insufficient documentation

## 2016-11-21 DIAGNOSIS — I130 Hypertensive heart and chronic kidney disease with heart failure and stage 1 through stage 4 chronic kidney disease, or unspecified chronic kidney disease: Secondary | ICD-10-CM | POA: Diagnosis not present

## 2016-11-21 DIAGNOSIS — E1140 Type 2 diabetes mellitus with diabetic neuropathy, unspecified: Secondary | ICD-10-CM | POA: Insufficient documentation

## 2016-11-21 DIAGNOSIS — M545 Low back pain: Secondary | ICD-10-CM | POA: Diagnosis present

## 2016-11-21 DIAGNOSIS — Z79899 Other long term (current) drug therapy: Secondary | ICD-10-CM | POA: Insufficient documentation

## 2016-11-21 DIAGNOSIS — M549 Dorsalgia, unspecified: Secondary | ICD-10-CM | POA: Diagnosis not present

## 2016-11-21 DIAGNOSIS — M25552 Pain in left hip: Secondary | ICD-10-CM | POA: Diagnosis not present

## 2016-11-21 DIAGNOSIS — Z7982 Long term (current) use of aspirin: Secondary | ICD-10-CM | POA: Diagnosis not present

## 2016-11-21 DIAGNOSIS — Z87891 Personal history of nicotine dependence: Secondary | ICD-10-CM | POA: Diagnosis not present

## 2016-11-21 DIAGNOSIS — N183 Chronic kidney disease, stage 3 (moderate): Secondary | ICD-10-CM | POA: Insufficient documentation

## 2016-11-21 DIAGNOSIS — S3992XA Unspecified injury of lower back, initial encounter: Secondary | ICD-10-CM | POA: Diagnosis not present

## 2016-11-21 DIAGNOSIS — I5032 Chronic diastolic (congestive) heart failure: Secondary | ICD-10-CM | POA: Diagnosis not present

## 2016-11-21 DIAGNOSIS — M25572 Pain in left ankle and joints of left foot: Secondary | ICD-10-CM | POA: Diagnosis not present

## 2016-11-21 DIAGNOSIS — S99912A Unspecified injury of left ankle, initial encounter: Secondary | ICD-10-CM | POA: Diagnosis not present

## 2016-11-21 DIAGNOSIS — M5416 Radiculopathy, lumbar region: Secondary | ICD-10-CM

## 2016-11-21 MED ORDER — HYDROCODONE-ACETAMINOPHEN 5-325 MG PO TABS: 1.0000 | ORAL_TABLET | Freq: Four times a day (QID) | ORAL | 0 refills | Status: DC | PRN

## 2016-11-21 MED ORDER — HYDROCODONE-ACETAMINOPHEN 5-325 MG PO TABS
1.0000 | ORAL_TABLET | Freq: Once | ORAL | Status: AC
  Administered 2016-11-21: 1 via ORAL
  Filled 2016-11-21: qty 1

## 2016-11-21 MED ORDER — OXYCODONE-ACETAMINOPHEN 5-325 MG PO TABS
1.0000 | ORAL_TABLET | Freq: Once | ORAL | Status: AC
  Administered 2016-11-21: 1 via ORAL
  Filled 2016-11-21: qty 1
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
## 2016-11-21 NOTE — Discharge Instructions (Signed)
Avoid strenuous activity. Take pain medication as prescribed as needed for severe pain. Please follow up with your back specialist. Your xrays of the back, him, ankle did not show any findings to explain your pain.

## 2016-11-21 NOTE — ED Triage Notes (Signed)
Pt states that he is experiencing L leg pain that radiates from his hip down to his ankle. Also endorses L foot swelling. Denies any injury. A&Ox4. Ambulatory with pain.

## 2016-11-21 NOTE — ED Notes (Signed)
Ambulated pt from the bed to door pt did well,but need a walker

## 2016-11-27 DIAGNOSIS — M545 Low back pain: Secondary | ICD-10-CM | POA: Diagnosis not present

## 2016-11-27 DIAGNOSIS — M79605 Pain in left leg: Secondary | ICD-10-CM | POA: Diagnosis not present

## 2016-11-27 DIAGNOSIS — Z9989 Dependence on other enabling machines and devices: Secondary | ICD-10-CM | POA: Diagnosis not present

## 2016-11-27 DIAGNOSIS — M533 Sacrococcygeal disorders, not elsewhere classified: Secondary | ICD-10-CM | POA: Diagnosis not present

## 2016-11-27 DIAGNOSIS — Z981 Arthrodesis status: Secondary | ICD-10-CM | POA: Diagnosis not present

## 2016-12-08 ENCOUNTER — Emergency Department (HOSPITAL_COMMUNITY)
Admission: EM | Admit: 2016-12-08 | Discharge: 2016-12-09 | Disposition: A | Discharge status: Home / Self Care | Payer: Medicare Other | Attending: Emergency Medicine | Admitting: Emergency Medicine

## 2016-12-08 ENCOUNTER — Encounter (HOSPITAL_COMMUNITY): Payer: Self-pay | Admitting: *Deleted

## 2016-12-08 DIAGNOSIS — S8990XA Unspecified injury of unspecified lower leg, initial encounter: Secondary | ICD-10-CM | POA: Diagnosis not present

## 2016-12-08 DIAGNOSIS — Z87891 Personal history of nicotine dependence: Secondary | ICD-10-CM | POA: Diagnosis not present

## 2016-12-08 DIAGNOSIS — E119 Type 2 diabetes mellitus without complications: Secondary | ICD-10-CM | POA: Diagnosis not present

## 2016-12-08 DIAGNOSIS — Z8673 Personal history of transient ischemic attack (TIA), and cerebral infarction without residual deficits: Secondary | ICD-10-CM | POA: Insufficient documentation

## 2016-12-08 DIAGNOSIS — M722 Plantar fascial fibromatosis: Secondary | ICD-10-CM

## 2016-12-08 DIAGNOSIS — Z79899 Other long term (current) drug therapy: Secondary | ICD-10-CM | POA: Diagnosis not present

## 2016-12-08 DIAGNOSIS — R202 Paresthesia of skin: Secondary | ICD-10-CM | POA: Diagnosis not present

## 2016-12-08 DIAGNOSIS — I10 Essential (primary) hypertension: Secondary | ICD-10-CM | POA: Diagnosis not present

## 2016-12-08 DIAGNOSIS — M7989 Other specified soft tissue disorders: Secondary | ICD-10-CM | POA: Diagnosis present

## 2016-12-08 NOTE — ED Triage Notes (Signed)
PT arrives via [facility] c/o bilateral leg pain for several hours, stated was unable to get up and walk d/t the pain. Does not take his Gabapentin because it makes him feel worse. Non pitting edema to bilateral calf. 138/72, cbg wnl.

## 2016-12-08 NOTE — ED Triage Notes (Signed)
Pt c/o bilateral feet swelling and pain. Pt reports hx of gout, unsure if they gave him any medications for the same.

## 2016-12-08 NOTE — ED Notes (Signed)
Bed: WA22 Expected date:  Expected time:  Means of arrival:  Comments: 

## 2016-12-09 MED ORDER — HYDROCODONE-ACETAMINOPHEN 5-325 MG PO TABS: 1.0000 | ORAL_TABLET | Freq: Four times a day (QID) | ORAL | 0 refills | Status: DC | PRN

## 2016-12-09 MED ORDER — HYDROCODONE-ACETAMINOPHEN 5-325 MG PO TABS
2.0000 | ORAL_TABLET | Freq: Once | ORAL | Status: AC
  Administered 2016-12-09: 2 via ORAL
  Filled 2016-12-09: qty 2

## 2016-12-09 MED ORDER — DOCUSATE SODIUM 100 MG PO CAPS: 100.0000 mg | ORAL_CAPSULE | Freq: Two times a day (BID) | ORAL | 0 refills | Status: DC

## 2016-12-09 NOTE — ED Provider Notes (Signed)
By signing my name below, I, Luis Poole, attest that this documentation has been prepared under the direction and in the presence of Luis Marcy N Gaylord Seydel, DO . Electronically Signed: Teofilo Poole, ED Scribe. 12/09/2016. 12:40 AM.  TIME SEEN: 12:31 AM  CHIEF COMPLAINT:  Chief Complaint  Patient presents with  . Leg Pain     HPI:  HPI Comments:  Luis Poole is a 76 y.o. male with PMHx of gout, DM and stroke With residual left-sided weakness who presents to the Emergency Department complaining of ongoing bilateral foot pain x 3 weeks. He states that his right foot began swelling over the past few days, but his left foot has been swollen and painful since 11/19/2016. He has always had intermittent swelling to the left foot since his stroke. Denies any new injury/trauma. Pt complains of associated numbness/tingling to the left great toe. No other new numbness or focal weakness. He states that the pain is worse when standing. Pt denies other associated symptoms. Has not taken anything at home for pain. No redness or warmth. No fever. No chest pain or shortness of breath.   ROS: See HPI Constitutional: no fever  Eyes: no drainage  ENT: no runny nose   Cardiovascular:  no chest pain  Resp: no SOB  GI: no vomiting GU: no dysuria Integumentary: no rash  Allergy: no hives  Musculoskeletal: +foot pain, swelling  Neurological: +numbness, no slurred speech ROS otherwise negative  PAST MEDICAL HISTORY/PAST SURGICAL HISTORY:  Past Medical History:  Diagnosis Date  . Anemia, unspecified 06/08/2013  . Cataract   . Diabetes mellitus   . Hypertension   . S/P lumbar fusion   . Stroke [facility])     MEDICATIONS:  Prior to Admission medications   Medication Sig Start Date End Date Taking? Authorizing Provider  acetaminophen (TYLENOL) 650 MG CR tablet Take 650-1,300 mg by mouth every 8 (eight) hours as needed for pain.     Historical Provider, MD  amLODipine (NORVASC) 10 MG tablet Take 10  mg by mouth daily.     Historical Provider, MD  aspirin buffered (BUFFERIN) 325 MG TABS tablet Take 325 mg by mouth daily.    Historical Provider, MD  cloNIDine (CATAPRES - DOSED IN MG/24 HR) 0.3 mg/24hr Place 1 patch onto the skin once a week.     Historical Provider, MD  doxazosin (CARDURA) 8 MG tablet Take 8 mg by mouth at bedtime.     Historical Provider, MD  gabapentin (NEURONTIN) 300 MG capsule Take 600 mg by mouth 3 (three) times daily.     Historical Provider, MD  HYDROcodone-acetaminophen (NORCO) 5-325 MG tablet Take 1-2 tablets by mouth every 6 (six) hours as needed. 11/21/16   Tatyana Kirichenko, PA-C  Iron-FA-B Cmp-C-Biot-Probiotic (FUSION PLUS) CAPS Take 1 capsule by mouth daily.    Historical Provider, MD  multivitamin-lutein (OCUVITE-LUTEIN) CAPS capsule Take 1 capsule by mouth 2 (two) times daily.    Historical Provider, MD  ranitidine (ZANTAC) 300 MG tablet Take 300 mg by mouth at bedtime.    Historical Provider, MD  triamterene-hydrochlorothiazide (MAXZIDE-25) 37.5-25 MG per tablet Take 1 tablet by mouth daily.     Historical Provider, MD    ALLERGIES:  Allergies  Allergen Reactions  . Lisinopril Anaphylaxis    SOCIAL HISTORY:  Social History  Substance Use Topics  . Smoking status: Former Games developer  . Smokeless tobacco: Former Neurosurgeon  . Alcohol use No    FAMILY HISTORY: Family History  Problem Relation Age  of Onset  . Stroke Mother     EXAM: BP (!) 148/91 (BP Location: Right Arm)   Pulse 87   Temp 98.4 F (36.9 C) (Oral)   Resp 18   SpO2 100%  CONSTITUTIONAL: Alert and oriented and responds appropriately to questions.Elderly, chronically ill-appearing, afebrile HEAD: Normocephalic EYES: Conjunctivae clear, pupils appear equal, EOMI ENT: normal nose; moist mucous membranes NECK: Supple, no meningismus, no nuchal rigidity, no LAD  CARD: RRR; S1 and S2 appreciated; no murmurs, no clicks, no rubs, no gallops RESP: Normal chest excursion without splinting or  tachypnea; breath sounds clear and equal bilaterally; no wheezes, no rhonchi, no rales, no hypoxia or respiratory distress, speaking full sentences ABD/GI: Normal bowel sounds; non-distended; soft, non-tender, no rebound, no guarding, no peritoneal signs, no hepatosplenomegaly BACK:  The back appears normal and is non-tender to palpation, there is no CVA tenderness EXT: Patient is tender to palpation over the insertion of the plantar fascia in bilateral feet. There is no associated erythema, warmth, fluctuance, induration. He has 2+ DP pulses bilaterally. No signs of gout. No joint effusion. Compartments are soft. Mild swelling noted to the left foot compared to the right which he reports is chronic. Normal ROM in all joints; otherwise extremities are non-tender to palpation; no pitting edema; normal capillary refill; no cyanosis, no calf tenderness or swelling    SKIN: Normal color for age and race; warm; no rash NEURO: Moves all extremities equally, reports normal sensation diffusely, does have chronic weakness in the left leg which is unchanged, normal speech, cranial nerves II through XII intact PSYCH: The patient's mood and manner are appropriate. Grooming and personal hygiene are appropriate.  MEDICAL DECISION MAKING: Patient here with what looks like plantar fasciitis. He is no sign of cellulitis, abscess, septic arthritis, gout on exam. He has strong palpable DP pulses and his extremity are warm and well-perfused. Has chronic mild swelling of the left foot since his stroke. I do not think this is a DVT. He wears a brace on the left leg because of his stroke and uses a walker at all times but thinks he may benefit from a wheelchair. Will provide him with a prescription for a wheelchair. He lives at home with his sister. We'll discharge with prescription for Vicodin for pain control and recommended close follow-up with his PCP if symptoms continue. At this time I do not feel he needs further emergent  workup. Discussed supportive care instructions.  At this time, I do not feel there is any life-threatening condition present. I have reviewed and discussed all results (EKG, imaging, lab, urine as appropriate) and exam findings with patient/family. I have reviewed nursing notes and appropriate previous records.  I feel the patient is safe to be discharged home without further emergent workup and can continue workup as an outpatient as needed. Discussed usual and customary return precautions. Patient/family verbalize understanding and are comfortable with this plan.  Outpatient follow-up has been provided if needed. All questions have been answered.   I personally performed the services described in this documentation, which was scribed in my presence. The recorded information has been reviewed and is accurate.     Layla Maw Rommel Hogston, DO 12/09/16 0117

## 2017-01-03 DIAGNOSIS — S79912S Unspecified injury of left hip, sequela: Secondary | ICD-10-CM | POA: Diagnosis not present

## 2017-01-03 DIAGNOSIS — E118 Type 2 diabetes mellitus with unspecified complications: Secondary | ICD-10-CM | POA: Diagnosis not present

## 2017-01-03 DIAGNOSIS — M722 Plantar fascial fibromatosis: Secondary | ICD-10-CM | POA: Diagnosis not present

## 2017-01-03 DIAGNOSIS — I10 Essential (primary) hypertension: Secondary | ICD-10-CM | POA: Diagnosis not present

## 2017-01-03 DIAGNOSIS — J449 Chronic obstructive pulmonary disease, unspecified: Secondary | ICD-10-CM | POA: Diagnosis not present

## 2017-01-16 ENCOUNTER — Emergency Department (HOSPITAL_COMMUNITY)
Admission: EM | Admit: 2017-01-16 | Discharge: 2017-01-16 | Disposition: A | Discharge status: Home / Self Care | Payer: Medicare Other | Attending: Emergency Medicine | Admitting: Emergency Medicine

## 2017-01-16 ENCOUNTER — Emergency Department (HOSPITAL_COMMUNITY): Payer: Medicare Other

## 2017-01-16 ENCOUNTER — Encounter (HOSPITAL_COMMUNITY): Payer: Self-pay

## 2017-01-16 DIAGNOSIS — W109XXA Fall (on) (from) unspecified stairs and steps, initial encounter: Secondary | ICD-10-CM | POA: Insufficient documentation

## 2017-01-16 DIAGNOSIS — Z79899 Other long term (current) drug therapy: Secondary | ICD-10-CM | POA: Diagnosis not present

## 2017-01-16 DIAGNOSIS — Y929 Unspecified place or not applicable: Secondary | ICD-10-CM | POA: Diagnosis not present

## 2017-01-16 DIAGNOSIS — Z87891 Personal history of nicotine dependence: Secondary | ICD-10-CM | POA: Insufficient documentation

## 2017-01-16 DIAGNOSIS — Y939 Activity, unspecified: Secondary | ICD-10-CM | POA: Insufficient documentation

## 2017-01-16 DIAGNOSIS — E1122 Type 2 diabetes mellitus with diabetic chronic kidney disease: Secondary | ICD-10-CM | POA: Diagnosis not present

## 2017-01-16 DIAGNOSIS — I130 Hypertensive heart and chronic kidney disease with heart failure and stage 1 through stage 4 chronic kidney disease, or unspecified chronic kidney disease: Secondary | ICD-10-CM | POA: Insufficient documentation

## 2017-01-16 DIAGNOSIS — N183 Chronic kidney disease, stage 3 (moderate): Secondary | ICD-10-CM | POA: Diagnosis not present

## 2017-01-16 DIAGNOSIS — Y999 Unspecified external cause status: Secondary | ICD-10-CM | POA: Insufficient documentation

## 2017-01-16 DIAGNOSIS — S99912A Unspecified injury of left ankle, initial encounter: Secondary | ICD-10-CM | POA: Diagnosis not present

## 2017-01-16 DIAGNOSIS — I5032 Chronic diastolic (congestive) heart failure: Secondary | ICD-10-CM | POA: Diagnosis not present

## 2017-01-16 DIAGNOSIS — E1140 Type 2 diabetes mellitus with diabetic neuropathy, unspecified: Secondary | ICD-10-CM | POA: Insufficient documentation

## 2017-01-16 DIAGNOSIS — Z7982 Long term (current) use of aspirin: Secondary | ICD-10-CM | POA: Insufficient documentation

## 2017-01-16 DIAGNOSIS — M5416 Radiculopathy, lumbar region: Secondary | ICD-10-CM | POA: Diagnosis not present

## 2017-01-16 DIAGNOSIS — S93402A Sprain of unspecified ligament of left ankle, initial encounter: Secondary | ICD-10-CM

## 2017-01-16 DIAGNOSIS — M25572 Pain in left ankle and joints of left foot: Secondary | ICD-10-CM | POA: Diagnosis not present

## 2017-01-16 DIAGNOSIS — Z8673 Personal history of transient ischemic attack (TIA), and cerebral infarction without residual deficits: Secondary | ICD-10-CM | POA: Diagnosis not present

## 2017-01-16 MED ORDER — IBUPROFEN 200 MG PO TABS
600.0000 mg | ORAL_TABLET | Freq: Once | ORAL | Status: AC
  Administered 2017-01-16: 600 mg via ORAL
  Filled 2017-01-16: qty 3

## 2017-01-16 NOTE — Discharge Instructions (Signed)
Please apply ice, restrict the use of ankle and take motrin / tylenol for pain in the ankle. The Xrays of the ankle are normal. For the back -we have printed exercises.  Please see your primary doctor in 2 weeks time.

## 2017-01-16 NOTE — ED Triage Notes (Signed)
Patient states he was going  up the stairs steps a week ago and could not get his left leg to the next step and fell backwards, causing the left leg to get twisted. Patient states he is still having pain in the left lower back and left knee pain to the left ankle. Patient uses a walker from a previous stroke.

## 2017-01-25 NOTE — ED Provider Notes (Signed)
[facility]-EMERGENCY DEPT Provider Note   CSN: [id number] Arrival date & time: 01/16/17  1414     History   Chief Complaint Chief Complaint  Patient presents with  . Fall  . Back Pain  . Leg Pain    HPI Luis Poole is a 76 y.o. male.  HPI PT comes in with cc of ankle pain. Pt was coming down stairs last week when he took a misstep and twisted his  Ankle. Pt has been pain in ankle since then. Pt also c/o of chronic back pain. He has seen pcp and orthopedist for that problem. No new numbness.  Past Medical History:  Diagnosis Date  . Anemia, unspecified 06/08/2013  . Cataract   . Diabetes mellitus   . Hypertension   . S/P lumbar fusion   . Stroke [facility](HCC)     Patient Active Problem List   Diagnosis Date Noted  . Leg swelling 08/23/2013  . Left leg swelling 08/23/2013  . Neuropathy 08/23/2013  . Acute gout 08/23/2013  . D-dimer, elevated 08/23/2013  . CKD (chronic kidney disease), stage III 08/23/2013  . Chronic diastolic heart failure (HCC) 08/23/2013  . Anemia 06/08/2013  . Hypertension 08/29/2012  . Diabetes mellitus type 2 in nonobese (HCC) 08/29/2012  . History of CVA (cerebrovascular accident) 08/28/2012    Class: Acute  . L-S radiculopathy 08/28/2012  . Lumbar post-laminectomy syndrome 01/15/2012  . Thoracic or lumbosacral neuritis or radiculitis, unspecified 01/15/2012  . Left hemiparesis (HCC) 01/15/2012  . Diabetic peripheral neuropathy (HCC) 01/15/2012    Past Surgical History:  Procedure Laterality Date  . CATARACT EXTRACTION    . EYE SURGERY    . KNEE SURGERY  2006  . NECK SURGERY  2012  . SPINE SURGERY  2009  . TONSILECTOMY, ADENOIDECTOMY, BILATERAL MYRINGOTOMY AND TUBES         Home Medications    Prior to Admission medications   Medication Sig Start Date End Date Taking? Authorizing Provider  acetaminophen (TYLENOL) 650 MG CR tablet Take 650-1,300 mg by mouth every 8 (eight) hours as needed for pain.    Yes [provider]    amLODipine (NORVASC) 10 MG tablet Take 10 mg by mouth daily.    Yes [provider]  docusate sodium (COLACE) 100 MG capsule Take 1 capsule (100 mg total) by mouth every 12 (twelve) hours. 12/09/16  Yes Ward, Layla MawKristen N, DO  doxazosin (CARDURA) 8 MG tablet Take 8 mg by mouth at bedtime.    Yes [provider]  multivitamin-lutein (OCUVITE-LUTEIN) CAPS capsule Take 1 capsule by mouth 2 (two) times daily.   Yes [provider]  pregabalin (LYRICA) 100 MG capsule Take 100 mg by mouth 2 (two) times daily.   Yes [provider]  triamterene-hydrochlorothiazide (MAXZIDE-25) 37.5-25 MG per tablet Take 1 tablet by mouth daily.    Yes [provider]  aspirin buffered (BUFFERIN) 325 MG TABS tablet Take 325 mg by mouth daily.    [provider]  HYDROcodone-acetaminophen (NORCO/VICODIN) 5-325 MG tablet Take 1-2 tablets by mouth every 6 (six) hours as needed. Patient not taking: Reported on 01/16/2017 12/09/16   Ward, Layla MawKristen N, DO    Family History Family History  Problem Relation Age of Onset  . Stroke Mother     Social History Social History  Substance Use Topics  . Smoking status: Former Games developermoker  . Smokeless tobacco: Former NeurosurgeonUser  . Alcohol use No     Allergies   Lisinopril and Gabapentin  Review of Systems Review of Systems  Constitutional: Negative for activity change and appetite change.  Respiratory: Negative for cough and shortness of breath.   Cardiovascular: Negative for chest pain.  Gastrointestinal: Negative for abdominal pain.  Genitourinary: Negative for dysuria.  Musculoskeletal: Positive for arthralgias, back pain, gait problem and myalgias.     Physical Exam Updated Vital Signs BP (!) 151/88   Pulse 61   Temp 98.1 F (36.7 C) (Oral)   Resp 11   Ht 5\' 7"  (1.702 m)   Wt 93.9 kg (207 lb)   SpO2 100%   BMI 32.42 kg/m   Physical Exam  Constitutional: He is oriented to person, place, and time. He appears  well-developed.  HENT:  Head: Normocephalic and atraumatic.  Eyes: Conjunctivae and EOM are normal. Pupils are equal, round, and reactive to light.  Neck: Normal range of motion. Neck supple.  Cardiovascular: Normal rate and regular rhythm.   Pulmonary/Chest: Effort normal and breath sounds normal.  Abdominal: Soft. Bowel sounds are normal. He exhibits no distension and no mass. There is no tenderness. There is no rebound and no guarding.  Musculoskeletal: He exhibits edema and tenderness. He exhibits no deformity.  Neurological: He is alert and oriented to person, place, and time.  Skin: Skin is warm.  Nursing note and vitals reviewed.    ED Treatments / Results  Labs (all labs ordered are listed, but only abnormal results are displayed) Labs Reviewed - No data to display  EKG  EKG Interpretation None       Radiology No results found.  Procedures Procedures (including critical care time)  Medications Ordered in ED Medications  ibuprofen (ADVIL,MOTRIN) tablet 600 mg (600 mg Oral Given 01/16/17 1724)     Initial Impression / Assessment and Plan / ED Course  I have reviewed the triage vital signs and the nursing notes.  Pertinent labs & imaging results that were available during my care of the patient were reviewed by me and considered in my medical decision making (see chart for details).     Ankle sprain based on neg xrays. Has OA. 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  Final Clinical Impressions(s) / ED Diagnoses   Final diagnoses:  Sprain of left ankle, unspecified ligament, initial encounter  Lumbar radicular pain    New Prescriptions Discharge Medication List as of 01/16/2017  8:53 PM       Derwood Kaplan, MD 01/25/17 9604

## 2017-01-26 DIAGNOSIS — E119 Type 2 diabetes mellitus without complications: Secondary | ICD-10-CM | POA: Diagnosis not present

## 2017-02-13 DIAGNOSIS — M533 Sacrococcygeal disorders, not elsewhere classified: Secondary | ICD-10-CM | POA: Diagnosis not present

## 2017-02-13 DIAGNOSIS — G8929 Other chronic pain: Secondary | ICD-10-CM | POA: Diagnosis not present

## 2017-02-21 ENCOUNTER — Emergency Department (HOSPITAL_COMMUNITY)
Admission: EM | Admit: 2017-02-21 | Discharge: 2017-02-22 | Disposition: A | Discharge status: Home / Self Care | Payer: Medicare Other | Attending: Emergency Medicine | Admitting: Emergency Medicine

## 2017-02-21 ENCOUNTER — Emergency Department (HOSPITAL_COMMUNITY): Payer: Medicare Other

## 2017-02-21 ENCOUNTER — Encounter (HOSPITAL_COMMUNITY): Payer: Self-pay | Admitting: Emergency Medicine

## 2017-02-21 DIAGNOSIS — I130 Hypertensive heart and chronic kidney disease with heart failure and stage 1 through stage 4 chronic kidney disease, or unspecified chronic kidney disease: Secondary | ICD-10-CM | POA: Insufficient documentation

## 2017-02-21 DIAGNOSIS — W19XXXA Unspecified fall, initial encounter: Secondary | ICD-10-CM | POA: Diagnosis not present

## 2017-02-21 DIAGNOSIS — E119 Type 2 diabetes mellitus without complications: Secondary | ICD-10-CM | POA: Diagnosis not present

## 2017-02-21 DIAGNOSIS — S300XXA Contusion of lower back and pelvis, initial encounter: Secondary | ICD-10-CM

## 2017-02-21 DIAGNOSIS — Y999 Unspecified external cause status: Secondary | ICD-10-CM | POA: Diagnosis not present

## 2017-02-21 DIAGNOSIS — Y92009 Unspecified place in unspecified non-institutional (private) residence as the place of occurrence of the external cause: Secondary | ICD-10-CM

## 2017-02-21 DIAGNOSIS — Z87891 Personal history of nicotine dependence: Secondary | ICD-10-CM | POA: Insufficient documentation

## 2017-02-21 DIAGNOSIS — Y9389 Activity, other specified: Secondary | ICD-10-CM | POA: Insufficient documentation

## 2017-02-21 DIAGNOSIS — M5416 Radiculopathy, lumbar region: Secondary | ICD-10-CM | POA: Diagnosis not present

## 2017-02-21 DIAGNOSIS — Y9289 Other specified places as the place of occurrence of the external cause: Secondary | ICD-10-CM | POA: Insufficient documentation

## 2017-02-21 DIAGNOSIS — I5032 Chronic diastolic (congestive) heart failure: Secondary | ICD-10-CM | POA: Insufficient documentation

## 2017-02-21 DIAGNOSIS — Z79899 Other long term (current) drug therapy: Secondary | ICD-10-CM | POA: Insufficient documentation

## 2017-02-21 DIAGNOSIS — N183 Chronic kidney disease, stage 3 (moderate): Secondary | ICD-10-CM | POA: Insufficient documentation

## 2017-02-21 DIAGNOSIS — M545 Low back pain: Secondary | ICD-10-CM | POA: Diagnosis not present

## 2017-02-21 MED ORDER — HYDROCODONE-ACETAMINOPHEN 5-325 MG PO TABS
1.0000 | ORAL_TABLET | Freq: Once | ORAL | Status: AC
  Administered 2017-02-21: 1 via ORAL
  Filled 2017-02-21: qty 1

## 2017-02-21 MED ORDER — IBUPROFEN 200 MG PO TABS
600.0000 mg | ORAL_TABLET | Freq: Once | ORAL | Status: AC
  Administered 2017-02-21: 600 mg via ORAL
  Filled 2017-02-21: qty 3

## 2017-02-21 NOTE — ED Provider Notes (Signed)
[facility]-EMERGENCY DEPT Provider Note   CSN: [id number] Arrival date & time: 02/21/17  2152   By signing my name below, I, Soijett Blue, attest that this documentation has been prepared under the direction and in the presence of Derwood KaplanNanavati, Lance Huaracha, MD. Electronically Signed: Soijett Blue, ED Scribe. 02/21/17. 11:27 PM.  History   Chief Complaint Chief Complaint  Patient presents with  . Fall  . Back Pain    HPI Luis Poole is a 76 y.o. male with a PMHx of lumbar fusion, HTN, DM, who presents to the Emergency Department complaining of lower back pain s/p fall onset yesterday. Pt reports associated tingling sensation to LLE. Pt has tried 2 tylenol with his last dose being tonight with no relief of his symptoms. He notes that he fell backwards twice recently with his most recent fall being yesterday both times while attempting to place groceries into his car. He states that he ambulates with a walker and that he has a follow up with a specialist for possible injections to his back. Pt reports that he had a stroke recently prior to the onset of his symptoms. Pt notes that he lives at home with his two siblings. He denies hitting his head, LOC, numbness, and any other symptoms.     The history is provided by the patient. No language interpreter was used.    Past Medical History:  Diagnosis Date  . Anemia, unspecified 06/08/2013  . Cataract   . Diabetes mellitus   . Hypertension   . S/P lumbar fusion   . Stroke [facility](HCC)     Patient Active Problem List   Diagnosis Date Noted  . Leg swelling 08/23/2013  . Left leg swelling 08/23/2013  . Neuropathy 08/23/2013  . Acute gout 08/23/2013  . D-dimer, elevated 08/23/2013  . CKD (chronic kidney disease), stage III 08/23/2013  . Chronic diastolic heart failure (HCC) 08/23/2013  . Anemia 06/08/2013  . Hypertension 08/29/2012  . Diabetes mellitus type 2 in nonobese (HCC) 08/29/2012  . History of CVA (cerebrovascular accident) 08/28/2012    Class:  Acute  . L-S radiculopathy 08/28/2012  . Lumbar post-laminectomy syndrome 01/15/2012  . Thoracic or lumbosacral neuritis or radiculitis, unspecified 01/15/2012  . Left hemiparesis (HCC) 01/15/2012  . Diabetic peripheral neuropathy (HCC) 01/15/2012    Past Surgical History:  Procedure Laterality Date  . CATARACT EXTRACTION    . EYE SURGERY    . KNEE SURGERY  2006  . NECK SURGERY  2012  . SPINE SURGERY  2009  . TONSILECTOMY, ADENOIDECTOMY, BILATERAL MYRINGOTOMY AND TUBES         Home Medications    Prior to Admission medications   Medication Sig Start Date End Date Taking? Authorizing Provider  amLODipine (NORVASC) 10 MG tablet Take 10 mg by mouth daily.    Yes [provider]  aspirin buffered (BUFFERIN) 325 MG TABS tablet Take 325 mg by mouth daily.   Yes [provider]  doxazosin (CARDURA) 8 MG tablet Take 8 mg by mouth at bedtime.    Yes [provider]  multivitamin-lutein (OCUVITE-LUTEIN) CAPS capsule Take 1 capsule by mouth 2 (two) times daily.   Yes [provider]  pregabalin (LYRICA) 100 MG capsule Take 100 mg by mouth 2 (two) times daily.   Yes [provider]  triamterene-hydrochlorothiazide (MAXZIDE-25) 37.5-25 MG per tablet Take 1 tablet by mouth daily.    Yes [provider]  acetaminophen (TYLENOL) 650 MG CR tablet Take 1 tablet (650 mg total) by  mouth 2 (two) times daily. 02/22/17   Derwood Kaplan, MD  docusate sodium (COLACE) 100 MG capsule Take 1 capsule (100 mg total) by mouth every 12 (twelve) hours. Patient not taking: Reported on 02/21/2017 12/09/16   Ward, Layla Maw, DO  HYDROcodone-acetaminophen (NORCO/VICODIN) 5-325 MG tablet Take 1-2 tablets by mouth every 6 (six) hours as needed. Patient not taking: Reported on 01/16/2017 12/09/16   Ward, Layla Maw, DO    Family History Family History  Problem Relation Age of Onset  . Stroke Mother     Social History Social History  Substance Use Topics  .  Smoking status: Former Games developer  . Smokeless tobacco: Former Neurosurgeon  . Alcohol use No     Allergies   Lisinopril and Gabapentin   Review of Systems Review of Systems  Musculoskeletal: Positive for back pain (lower). Negative for arthralgias and myalgias.  Neurological: Negative for syncope and numbness.       +tingling to LLE    Physical Exam Updated Vital Signs BP 140/80 (BP Location: Left Arm)   Pulse 75   Temp 97.6 F (36.4 C) (Oral)   Resp 16   Ht 5\' 7"  (1.702 m)   Wt 207 lb (93.9 kg)   SpO2 100%   BMI 32.42 kg/m   Physical Exam  Constitutional: He is oriented to person, place, and time. He appears well-developed and well-nourished. No distress.  HENT:  Head: Normocephalic and atraumatic.  Eyes: EOM are normal.  Neck: Neck supple.  Cardiovascular: Normal rate, regular rhythm and normal heart sounds.  Exam reveals no gallop and no friction rub.   No murmur heard. Pulmonary/Chest: Effort normal and breath sounds normal. No respiratory distress. He has no wheezes. He has no rales.  Musculoskeletal: Normal range of motion.       Lumbar back: He exhibits tenderness and bony tenderness.  Midline lumbar spine and paraspinal tenderness to palpation. No ecchymosis seen.   Neurological: He is alert and oriented to person, place, and time.  Trace patellar reflex in the right side. 4/5 strength noted to BLE.  Skin: Skin is warm and dry.  Psychiatric: He has a normal mood and affect. His behavior is normal.  Nursing note and vitals reviewed.    ED Treatments / Results  DIAGNOSTIC STUDIES: Oxygen Saturation is 100% on RA, nl by my interpretation.    COORDINATION OF CARE: 11:26 PM Discussed treatment plan with pt at bedside and pt agreed to plan.   Labs (all labs ordered are listed, but only abnormal results are displayed) Labs Reviewed - No data to display  EKG  EKG Interpretation None       Radiology Dg Lumbar Spine Complete  Result Date: 02/22/2017 CLINICAL  DATA:  Status post fall, with lower back pain and left lower extremity tingling. Initial encounter. EXAM: LUMBAR SPINE - COMPLETE 4+ VIEW COMPARISON:  Lumbar spine radiographs performed 11/21/2016 FINDINGS: There is no evidence of fracture or subluxation. Lumbosacral spinal fusion is noted at L5-S1. Vertebral bodies demonstrate normal height and alignment. Intervertebral disc spaces are preserved. The visualized neural foramina are grossly unremarkable in appearance. The visualized bowel gas pattern is unremarkable in appearance; air and stool are noted within the colon. The sacroiliac joints are within normal limits. IMPRESSION: No evidence of fracture or subluxation along the lumbar spine. Lumbosacral spinal fusion at L5-S1. Electronically Signed   By: Roanna Raider M.D.   On: 02/22/2017 00:04    Procedures Procedures (including critical care time)  Medications Ordered in  ED Medications  ibuprofen (ADVIL,MOTRIN) tablet 600 mg (600 mg Oral Given 02/21/17 2350)  HYDROcodone-acetaminophen (NORCO/VICODIN) 5-325 MG per tablet 1 tablet (1 tablet Oral Given 02/21/17 2350)     Initial Impression / Assessment and Plan / ED Course  I have reviewed the triage vital signs and the nursing notes.  Pertinent labs & imaging results that were available during my care of the patient were reviewed by me and considered in my medical decision making (see chart for details).     Pt comes in with cc of back pain. Pt had 2 falls in the last week. Able to ambulate, but with pain. Also has some radicular component to the pain. Pt has chronic back pain hx, and seems like he has acute on chronic exacerbation of the the back pain. Xrays are neg for acute process. I am concerned about the falls patient is having - and I have asked him to see his pcp for it and request PT/Ot eval. Pt is to see his spine doctor in [place] for pain control.   Final Clinical Impressions(s) / ED Diagnoses   Final diagnoses:  Fall in home,  initial encounter  Lumbar contusion, initial encounter  Pain, radicular, lumbar    New Prescriptions Current Discharge Medication List     I personally performed the services described in this documentation, which was scribed in my presence. The recorded information has been reviewed and is accurate.    Derwood Kaplan, MD 02/22/17 [phone]

## 2017-02-21 NOTE — ED Notes (Signed)
Pt from home with complaints of back pain following a fall on Sunday and a fall yesterday. Pt has central lower back pain from both falls. Pt denies head injury or other injuries and does not take blood thinner. Pt denies LOC. Pt states both falls were related to his walker. Pt states one time the walker wheel got stuck and another time he got the walker hooked on his pocket causing him to trip. Pt is ambulatory without assistance with his walker

## 2017-02-22 MED ORDER — ACETAMINOPHEN ER 650 MG PO TBCR: 650.0000 mg | EXTENDED_RELEASE_TABLET | Freq: Two times a day (BID) | ORAL | 0 refills | Status: DC

## 2017-02-22 NOTE — Discharge Instructions (Signed)
Please see your spine doctor for optimal pain control. Please also see your primary doctor for further evaluation of your back pain.

## 2017-03-03 DIAGNOSIS — M79672 Pain in left foot: Secondary | ICD-10-CM | POA: Diagnosis not present

## 2017-03-03 DIAGNOSIS — I739 Peripheral vascular disease, unspecified: Secondary | ICD-10-CM | POA: Diagnosis not present

## 2017-03-03 DIAGNOSIS — M79671 Pain in right foot: Secondary | ICD-10-CM | POA: Diagnosis not present

## 2017-03-03 DIAGNOSIS — B351 Tinea unguium: Secondary | ICD-10-CM | POA: Diagnosis not present

## 2017-03-03 DIAGNOSIS — E1151 Type 2 diabetes mellitus with diabetic peripheral angiopathy without gangrene: Secondary | ICD-10-CM | POA: Diagnosis not present

## 2017-03-03 DIAGNOSIS — L84 Corns and callosities: Secondary | ICD-10-CM | POA: Diagnosis not present

## 2017-03-19 DIAGNOSIS — M533 Sacrococcygeal disorders, not elsewhere classified: Secondary | ICD-10-CM | POA: Diagnosis not present

## 2017-03-24 DIAGNOSIS — M2012 Hallux valgus (acquired), left foot: Secondary | ICD-10-CM | POA: Diagnosis not present

## 2017-03-24 DIAGNOSIS — M79672 Pain in left foot: Secondary | ICD-10-CM | POA: Diagnosis not present

## 2017-03-24 DIAGNOSIS — M2011 Hallux valgus (acquired), right foot: Secondary | ICD-10-CM | POA: Diagnosis not present

## 2017-03-24 DIAGNOSIS — M79671 Pain in right foot: Secondary | ICD-10-CM | POA: Diagnosis not present

## 2017-03-24 DIAGNOSIS — B351 Tinea unguium: Secondary | ICD-10-CM | POA: Diagnosis not present

## 2017-04-03 DIAGNOSIS — G5792 Unspecified mononeuropathy of left lower limb: Secondary | ICD-10-CM | POA: Diagnosis not present

## 2017-04-03 DIAGNOSIS — E118 Type 2 diabetes mellitus with unspecified complications: Secondary | ICD-10-CM | POA: Diagnosis not present

## 2017-04-03 DIAGNOSIS — E069 Thyroiditis, unspecified: Secondary | ICD-10-CM | POA: Diagnosis not present

## 2017-04-03 DIAGNOSIS — E089 Diabetes mellitus due to underlying condition without complications: Secondary | ICD-10-CM | POA: Diagnosis not present

## 2017-04-03 DIAGNOSIS — I10 Essential (primary) hypertension: Secondary | ICD-10-CM | POA: Diagnosis not present

## 2017-04-09 DIAGNOSIS — M545 Low back pain: Secondary | ICD-10-CM | POA: Diagnosis not present

## 2017-04-09 DIAGNOSIS — M25562 Pain in left knee: Secondary | ICD-10-CM | POA: Diagnosis not present

## 2017-05-14 DIAGNOSIS — M79671 Pain in right foot: Secondary | ICD-10-CM | POA: Diagnosis not present

## 2017-05-14 DIAGNOSIS — M79672 Pain in left foot: Secondary | ICD-10-CM | POA: Diagnosis not present

## 2017-05-14 DIAGNOSIS — B351 Tinea unguium: Secondary | ICD-10-CM | POA: Diagnosis not present

## 2017-05-26 ENCOUNTER — Emergency Department (HOSPITAL_COMMUNITY)
Admission: EM | Admit: 2017-05-26 | Discharge: 2017-05-26 | Disposition: A | Discharge status: Home / Self Care | Payer: Medicare Other | Attending: Emergency Medicine | Admitting: Emergency Medicine

## 2017-05-26 ENCOUNTER — Encounter (HOSPITAL_COMMUNITY): Payer: Self-pay | Admitting: Emergency Medicine

## 2017-05-26 DIAGNOSIS — M79672 Pain in left foot: Secondary | ICD-10-CM

## 2017-05-26 DIAGNOSIS — I6789 Other cerebrovascular disease: Secondary | ICD-10-CM | POA: Diagnosis not present

## 2017-05-26 DIAGNOSIS — D649 Anemia, unspecified: Secondary | ICD-10-CM | POA: Insufficient documentation

## 2017-05-26 DIAGNOSIS — E1122 Type 2 diabetes mellitus with diabetic chronic kidney disease: Secondary | ICD-10-CM | POA: Insufficient documentation

## 2017-05-26 DIAGNOSIS — R202 Paresthesia of skin: Secondary | ICD-10-CM | POA: Diagnosis not present

## 2017-05-26 DIAGNOSIS — I130 Hypertensive heart and chronic kidney disease with heart failure and stage 1 through stage 4 chronic kidney disease, or unspecified chronic kidney disease: Secondary | ICD-10-CM | POA: Diagnosis not present

## 2017-05-26 DIAGNOSIS — M79671 Pain in right foot: Secondary | ICD-10-CM | POA: Diagnosis not present

## 2017-05-26 DIAGNOSIS — Z79899 Other long term (current) drug therapy: Secondary | ICD-10-CM | POA: Insufficient documentation

## 2017-05-26 DIAGNOSIS — I5032 Chronic diastolic (congestive) heart failure: Secondary | ICD-10-CM | POA: Insufficient documentation

## 2017-05-26 DIAGNOSIS — N183 Chronic kidney disease, stage 3 (moderate): Secondary | ICD-10-CM | POA: Insufficient documentation

## 2017-05-26 DIAGNOSIS — Z87891 Personal history of nicotine dependence: Secondary | ICD-10-CM | POA: Diagnosis not present

## 2017-05-26 DIAGNOSIS — E876 Hypokalemia: Secondary | ICD-10-CM | POA: Insufficient documentation

## 2017-05-26 DIAGNOSIS — R200 Anesthesia of skin: Secondary | ICD-10-CM | POA: Diagnosis not present

## 2017-05-26 DIAGNOSIS — N289 Disorder of kidney and ureter, unspecified: Secondary | ICD-10-CM

## 2017-05-26 LAB — CBC WITH DIFFERENTIAL/PLATELET
BASOS ABS: 0 10*3/uL (ref 0.0–0.1)
Basophils Relative: 0 %
EOS PCT: 2 %
Eosinophils Absolute: 0.1 10*3/uL (ref 0.0–0.7)
HCT: 33 % — ABNORMAL LOW (ref 39.0–52.0)
HEMOGLOBIN: 10.7 g/dL — AB (ref 13.0–17.0)
LYMPHS ABS: 2.1 10*3/uL (ref 0.7–4.0)
LYMPHS PCT: 29 %
MCH: 27.6 pg (ref 26.0–34.0)
MCHC: 32.4 g/dL (ref 30.0–36.0)
MCV: 85.3 fL (ref 78.0–100.0)
Monocytes Absolute: 0.7 10*3/uL (ref 0.1–1.0)
Monocytes Relative: 9 %
NEUTROS ABS: 4.4 10*3/uL (ref 1.7–7.7)
NEUTROS PCT: 60 %
PLATELETS: 228 10*3/uL (ref 150–400)
RBC: 3.87 MIL/uL — AB (ref 4.22–5.81)
RDW: 14.5 % (ref 11.5–15.5)
WBC: 7.2 10*3/uL (ref 4.0–10.5)

## 2017-05-26 LAB — BASIC METABOLIC PANEL
ANION GAP: 10 (ref 5–15)
BUN: 18 mg/dL (ref 6–20)
CHLORIDE: 100 mmol/L — AB (ref 101–111)
CO2: 27 mmol/L (ref 22–32)
Calcium: 8.8 mg/dL — ABNORMAL LOW (ref 8.9–10.3)
Creatinine, Ser: 1.42 mg/dL — ABNORMAL HIGH (ref 0.61–1.24)
GFR, EST AFRICAN AMERICAN: 55 mL/min — AB (ref >60)
GFR, EST NON AFRICAN AMERICAN: 47 mL/min — AB (ref >60)
Glucose, Bld: 120 mg/dL — ABNORMAL HIGH (ref 65–99)
POTASSIUM: 3.2 mmol/L — AB (ref 3.5–5.1)
SODIUM: 137 mmol/L (ref 135–145)

## 2017-05-26 MED ORDER — OXYCODONE-ACETAMINOPHEN 5-325 MG PO TABS: 1.0000 | ORAL_TABLET | ORAL | 0 refills | Status: DC | PRN

## 2017-05-26 MED ORDER — POTASSIUM CHLORIDE CRYS ER 20 MEQ PO TBCR
40.0000 meq | EXTENDED_RELEASE_TABLET | Freq: Once | ORAL | Status: AC
  Administered 2017-05-26: 40 meq via ORAL
  Filled 2017-05-26: qty 2

## 2017-05-26 MED ORDER — POTASSIUM CHLORIDE CRYS ER 20 MEQ PO TBCR: 20.0000 meq | EXTENDED_RELEASE_TABLET | Freq: Two times a day (BID) | ORAL | 0 refills | Status: DC

## 2017-05-26 MED ORDER — OXYCODONE-ACETAMINOPHEN 5-325 MG PO TABS
1.0000 | ORAL_TABLET | Freq: Once | ORAL | Status: AC
  Administered 2017-05-26: 1 via ORAL
  Filled 2017-05-26: qty 1

## 2017-05-26 NOTE — Discharge Instructions (Signed)
The cause of your foot pain is not clear. Please apply ice, and keep your feet elevated. Return if symptoms are getting worse.

## 2017-05-26 NOTE — ED Triage Notes (Signed)
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 Increased pain with ambulation radiates up left leg but also has pain to right foot as well.

## 2017-05-26 NOTE — ED Provider Notes (Signed)
[facility]-EMERGENCY DEPT Provider Note   CSN: [id number] Arrival date & time: 05/26/17  5638     History   Chief Complaint Chief Complaint  Patient presents with  . Foot Pain    HPI TALBOT MONARCH is a 76 y.o. male.  The history is provided by the patient.  He complains of pain in both feet for the last 3 days. Pain started after he walked home from the store Pain is sharp and severe and he rates it at 9/10. It is worse with weightbearing, better with rest. He has taken pain medication at home without relief. He has noted some swelling of his feet. He does have history of gout as well as hypertension and diabetes and chronic kidney disease. He is status post stroke with residual left to me per her cysts.  Past Medical History:  Diagnosis Date  . Anemia, unspecified 06/08/2013  . Cataract   . Diabetes mellitus   . Hypertension   . S/P lumbar fusion   . Stroke [facility])     Patient Active Problem List   Diagnosis Date Noted  . Leg swelling 08/23/2013  . Left leg swelling 08/23/2013  . Neuropathy 08/23/2013  . Acute gout 08/23/2013  . D-dimer, elevated 08/23/2013  . CKD (chronic kidney disease), stage III (HCC) 08/23/2013  . Chronic diastolic heart failure (HCC) 08/23/2013  . Anemia 06/08/2013  . Hypertension 08/29/2012  . Diabetes mellitus type 2 in nonobese (HCC) 08/29/2012  . History of CVA (cerebrovascular accident) 08/28/2012    Class: Acute  . L-S radiculopathy 08/28/2012  . Lumbar post-laminectomy syndrome 01/15/2012  . Thoracic or lumbosacral neuritis or radiculitis, unspecified 01/15/2012  . Left hemiparesis (HCC) 01/15/2012  . Diabetic peripheral neuropathy (HCC) 01/15/2012    Past Surgical History:  Procedure Laterality Date  . CATARACT EXTRACTION    . EYE SURGERY    . KNEE SURGERY  2006  . NECK SURGERY  2012  . SPINE SURGERY  2009  . TONSILECTOMY, ADENOIDECTOMY, BILATERAL MYRINGOTOMY AND TUBES         Home Medications    Prior to Admission medications    Medication Sig Start Date End Date Taking? Authorizing Provider  acetaminophen (TYLENOL) 650 MG CR tablet Take 1 tablet (650 mg total) by mouth 2 (two) times daily. 02/22/17   Derwood Kaplan, MD  amLODipine (NORVASC) 10 MG tablet Take 10 mg by mouth daily.     [provider]  aspirin buffered (BUFFERIN) 325 MG TABS tablet Take 325 mg by mouth daily.    [provider]  docusate sodium (COLACE) 100 MG capsule Take 1 capsule (100 mg total) by mouth every 12 (twelve) hours. Patient not taking: Reported on 02/21/2017 12/09/16   Ward, Layla Maw, DO  doxazosin (CARDURA) 8 MG tablet Take 8 mg by mouth at bedtime.     [provider]  HYDROcodone-acetaminophen (NORCO/VICODIN) 5-325 MG tablet Take 1-2 tablets by mouth every 6 (six) hours as needed. Patient not taking: Reported on 01/16/2017 12/09/16   Ward, Layla Maw, DO  multivitamin-lutein (OCUVITE-LUTEIN) CAPS capsule Take 1 capsule by mouth 2 (two) times daily.    [provider]  pregabalin (LYRICA) 100 MG capsule Take 100 mg by mouth 2 (two) times daily.    [provider]  triamterene-hydrochlorothiazide (MAXZIDE-25) 37.5-25 MG per tablet Take 1 tablet by mouth daily.     [provider]    Family History Family History  Problem Relation Age of Onset  . Stroke Mother  Social History Social History  Substance Use Topics  . Smoking status: Former Games developer  . Smokeless tobacco: Former Neurosurgeon  . Alcohol use No     Allergies   Lisinopril and Gabapentin   Review of Systems Review of Systems  All other systems reviewed and are negative.    Physical Exam Updated Vital Signs BP (!) 143/73 (BP Location: Left Arm)   Pulse 78   Resp 18   SpO2 98%   Physical Exam  Nursing note and vitals reviewed.  76 year old male, resting comfortably and in no acute distress. Vital signs are significant for mild hypertension. Oxygen saturation is 98%, which is normal. Head is normocephalic and  atraumatic. PERRLA, EOMI. Oropharynx is clear. Neck is nontender and supple without adenopathy or JVD. Back is nontender and there is no CVA tenderness. Lungs are clear without rales, wheezes, or rhonchi. Chest is nontender. Heart has regular rate and rhythm without murmur. Abdomen is soft, flat, nontender without masses or hepatosplenomegaly and peristalsis is normoactive. Extremities have 2+ edema, full range of motion is present. Here is tenderness to palpation over the soft tissues of both feet without any localization identified. No erythema or warmth. Skin is warm and dry without rash. Neurologic: Mental status is normal, cranial nerves are intact. Mild left hemi-para cyst present.  ED Treatments / Results  Labs (all labs ordered are listed, but only abnormal results are displayed) Labs Reviewed  BASIC METABOLIC PANEL - Abnormal; Notable for the following:       Result Value   Potassium 3.2 (*)    Chloride 100 (*)    Glucose, Bld 120 (*)    Creatinine, Ser 1.42 (*)    Calcium 8.8 (*)    GFR calc non Af Amer 47 (*)    GFR calc Af Amer 55 (*)    All other components within normal limits  CBC WITH DIFFERENTIAL/PLATELET - Abnormal; Notable for the following:    RBC 3.87 (*)    Hemoglobin 10.7 (*)    HCT 33.0 (*)    All other components within normal limits     Procedures Procedures (including critical care time)  Medications Ordered in ED Medications  oxyCODONE-acetaminophen (PERCOCET/ROXICET) 5-325 MG per tablet 1 tablet (1 tablet Oral Given 05/26/17 0332)  potassium chloride SA (K-DUR,KLOR-CON) CR tablet 40 mEq (40 mEq Oral Given 05/26/17 0441)     Initial Impression / Assessment and Plan / ED Course  I have reviewed the triage vital signs and the nursing notes.  Pertinent labs & imaging results that were available during my care of the patient were reviewed by me and considered in my medical decision making (see chart for details).  Bilateral foot pain of uncertain  cause. Old records are reviewed, and he hasher visit 6 months ago for similar pain which was diagnosed as plantar fasciitis. Current presentation is not consistent with plantar fasciitis nor is it consistent with gout. We'll check screening labs, but anticipate symptomatic treatment.  He was given a dose of oxycodone-acetaminophen with significant relief. After workup showed hypokalemia, renal insufficiency is unchanged from baseline, and anemia is unchanged from baseline. He is discharged with prescription for oxycodone have acetaminophen, and also for K-Dur. Follow-up with PCP, return precautions discussed.  Final Clinical Impressions(s) / ED Diagnoses   Final diagnoses:  Foot pain, bilateral  Renal insufficiency  Hypokalemia  Normochromic normocytic anemia    New Prescriptions New Prescriptions   OXYCODONE-ACETAMINOPHEN (PERCOCET) 5-325 MG TABLET    Take 1  tablet by mouth every 4 (four) hours as needed for moderate pain.   POTASSIUM CHLORIDE SA (K-DUR,KLOR-CON) 20 MEQ TABLET    Take 1 tablet (20 mEq total) by mouth 2 (two) times daily.     Dione Booze, MD 05/26/17 0530

## 2017-05-26 NOTE — ED Notes (Signed)
Bed: QM57 Expected date:  Expected time:  Means of arrival:  Comments: 76 yo M/ Swelling in feet

## 2017-05-27 DIAGNOSIS — N189 Chronic kidney disease, unspecified: Secondary | ICD-10-CM | POA: Diagnosis not present

## 2017-05-27 DIAGNOSIS — E876 Hypokalemia: Secondary | ICD-10-CM | POA: Diagnosis not present

## 2017-05-27 DIAGNOSIS — M1000 Idiopathic gout, unspecified site: Secondary | ICD-10-CM | POA: Diagnosis not present

## 2017-06-11 DIAGNOSIS — B351 Tinea unguium: Secondary | ICD-10-CM | POA: Diagnosis not present

## 2017-06-11 DIAGNOSIS — M79674 Pain in right toe(s): Secondary | ICD-10-CM | POA: Diagnosis not present

## 2017-06-11 DIAGNOSIS — M79675 Pain in left toe(s): Secondary | ICD-10-CM | POA: Diagnosis not present

## 2017-06-13 DIAGNOSIS — M1000 Idiopathic gout, unspecified site: Secondary | ICD-10-CM | POA: Diagnosis not present

## 2017-06-13 DIAGNOSIS — I10 Essential (primary) hypertension: Secondary | ICD-10-CM | POA: Diagnosis not present

## 2017-06-13 DIAGNOSIS — E118 Type 2 diabetes mellitus with unspecified complications: Secondary | ICD-10-CM | POA: Diagnosis not present

## 2017-06-13 DIAGNOSIS — J449 Chronic obstructive pulmonary disease, unspecified: Secondary | ICD-10-CM | POA: Diagnosis not present

## 2017-06-27 ENCOUNTER — Encounter (HOSPITAL_COMMUNITY): Payer: Self-pay | Admitting: Emergency Medicine

## 2017-06-27 ENCOUNTER — Other Ambulatory Visit: Payer: Self-pay

## 2017-06-27 ENCOUNTER — Emergency Department (HOSPITAL_COMMUNITY): Payer: Medicare Other

## 2017-06-27 ENCOUNTER — Observation Stay (HOSPITAL_COMMUNITY)
Admission: EM | Admit: 2017-06-27 | Discharge: 2017-06-29 | Disposition: A | Discharge status: Home / Self Care | Payer: Medicare Other | Attending: Nephrology | Admitting: Nephrology

## 2017-06-27 DIAGNOSIS — R200 Anesthesia of skin: Secondary | ICD-10-CM | POA: Insufficient documentation

## 2017-06-27 DIAGNOSIS — E119 Type 2 diabetes mellitus without complications: Secondary | ICD-10-CM

## 2017-06-27 DIAGNOSIS — M19071 Primary osteoarthritis, right ankle and foot: Secondary | ICD-10-CM | POA: Diagnosis not present

## 2017-06-27 DIAGNOSIS — I5032 Chronic diastolic (congestive) heart failure: Secondary | ICD-10-CM | POA: Diagnosis present

## 2017-06-27 DIAGNOSIS — M545 Low back pain, unspecified: Secondary | ICD-10-CM | POA: Diagnosis present

## 2017-06-27 DIAGNOSIS — Z888 Allergy status to other drugs, medicaments and biological substances status: Secondary | ICD-10-CM | POA: Diagnosis not present

## 2017-06-27 DIAGNOSIS — I10 Essential (primary) hypertension: Secondary | ICD-10-CM | POA: Diagnosis not present

## 2017-06-27 DIAGNOSIS — M40294 Other kyphosis, thoracic region: Secondary | ICD-10-CM | POA: Insufficient documentation

## 2017-06-27 DIAGNOSIS — Z8673 Personal history of transient ischemic attack (TIA), and cerebral infarction without residual deficits: Secondary | ICD-10-CM

## 2017-06-27 DIAGNOSIS — M961 Postlaminectomy syndrome, not elsewhere classified: Secondary | ICD-10-CM | POA: Insufficient documentation

## 2017-06-27 DIAGNOSIS — Z7982 Long term (current) use of aspirin: Secondary | ICD-10-CM | POA: Diagnosis not present

## 2017-06-27 DIAGNOSIS — I4510 Unspecified right bundle-branch block: Secondary | ICD-10-CM | POA: Diagnosis not present

## 2017-06-27 DIAGNOSIS — R918 Other nonspecific abnormal finding of lung field: Secondary | ICD-10-CM | POA: Diagnosis not present

## 2017-06-27 DIAGNOSIS — E1140 Type 2 diabetes mellitus with diabetic neuropathy, unspecified: Secondary | ICD-10-CM | POA: Diagnosis not present

## 2017-06-27 DIAGNOSIS — Z87891 Personal history of nicotine dependence: Secondary | ICD-10-CM | POA: Diagnosis not present

## 2017-06-27 DIAGNOSIS — Z23 Encounter for immunization: Secondary | ICD-10-CM | POA: Insufficient documentation

## 2017-06-27 DIAGNOSIS — M109 Gout, unspecified: Secondary | ICD-10-CM | POA: Insufficient documentation

## 2017-06-27 DIAGNOSIS — I130 Hypertensive heart and chronic kidney disease with heart failure and stage 1 through stage 4 chronic kidney disease, or unspecified chronic kidney disease: Secondary | ICD-10-CM | POA: Insufficient documentation

## 2017-06-27 DIAGNOSIS — Z79899 Other long term (current) drug therapy: Secondary | ICD-10-CM | POA: Insufficient documentation

## 2017-06-27 DIAGNOSIS — E1122 Type 2 diabetes mellitus with diabetic chronic kidney disease: Secondary | ICD-10-CM | POA: Diagnosis not present

## 2017-06-27 DIAGNOSIS — R296 Repeated falls: Secondary | ICD-10-CM

## 2017-06-27 DIAGNOSIS — N183 Chronic kidney disease, stage 3 unspecified: Secondary | ICD-10-CM | POA: Diagnosis present

## 2017-06-27 DIAGNOSIS — E1169 Type 2 diabetes mellitus with other specified complication: Secondary | ICD-10-CM

## 2017-06-27 DIAGNOSIS — R52 Pain, unspecified: Secondary | ICD-10-CM

## 2017-06-27 DIAGNOSIS — S0990XA Unspecified injury of head, initial encounter: Secondary | ICD-10-CM | POA: Diagnosis not present

## 2017-06-27 DIAGNOSIS — I69354 Hemiplegia and hemiparesis following cerebral infarction affecting left non-dominant side: Secondary | ICD-10-CM | POA: Insufficient documentation

## 2017-06-27 DIAGNOSIS — G8929 Other chronic pain: Secondary | ICD-10-CM | POA: Diagnosis not present

## 2017-06-27 DIAGNOSIS — Z981 Arthrodesis status: Secondary | ICD-10-CM | POA: Diagnosis not present

## 2017-06-27 DIAGNOSIS — W010XXA Fall on same level from slipping, tripping and stumbling without subsequent striking against object, initial encounter: Secondary | ICD-10-CM | POA: Insufficient documentation

## 2017-06-27 DIAGNOSIS — S3992XA Unspecified injury of lower back, initial encounter: Secondary | ICD-10-CM | POA: Diagnosis not present

## 2017-06-27 DIAGNOSIS — M5126 Other intervertebral disc displacement, lumbar region: Secondary | ICD-10-CM | POA: Diagnosis not present

## 2017-06-27 DIAGNOSIS — E1142 Type 2 diabetes mellitus with diabetic polyneuropathy: Secondary | ICD-10-CM | POA: Diagnosis not present

## 2017-06-27 DIAGNOSIS — R2681 Unsteadiness on feet: Secondary | ICD-10-CM | POA: Insufficient documentation

## 2017-06-27 DIAGNOSIS — N1831 Chronic kidney disease, stage 3a: Secondary | ICD-10-CM | POA: Diagnosis present

## 2017-06-27 DIAGNOSIS — G9389 Other specified disorders of brain: Secondary | ICD-10-CM | POA: Insufficient documentation

## 2017-06-27 DIAGNOSIS — S199XXA Unspecified injury of neck, initial encounter: Secondary | ICD-10-CM | POA: Diagnosis not present

## 2017-06-27 HISTORY — DX: Chronic kidney disease, unspecified: N18.9

## 2017-06-27 HISTORY — DX: Heart failure, unspecified: I50.9

## 2017-06-27 LAB — CBC WITH DIFFERENTIAL/PLATELET
Basophils Absolute: 0 10*3/uL (ref 0.0–0.1)
Basophils Relative: 0 %
Eosinophils Absolute: 0.1 10*3/uL (ref 0.0–0.7)
Eosinophils Relative: 2 %
HCT: 30.8 % — ABNORMAL LOW (ref 39.0–52.0)
Hemoglobin: 9.8 g/dL — ABNORMAL LOW (ref 13.0–17.0)
Lymphocytes Relative: 31 %
Lymphs Abs: 2.1 10*3/uL (ref 0.7–4.0)
MCH: 27.1 pg (ref 26.0–34.0)
MCHC: 31.8 g/dL (ref 30.0–36.0)
MCV: 85.1 fL (ref 78.0–100.0)
Monocytes Absolute: 0.7 10*3/uL (ref 0.1–1.0)
Monocytes Relative: 10 %
Neutro Abs: 3.9 10*3/uL (ref 1.7–7.7)
Neutrophils Relative %: 57 %
Platelets: 214 10*3/uL (ref 150–400)
RBC: 3.62 MIL/uL — ABNORMAL LOW (ref 4.22–5.81)
RDW: 14.6 % (ref 11.5–15.5)
WBC: 6.8 10*3/uL (ref 4.0–10.5)

## 2017-06-27 LAB — COMPREHENSIVE METABOLIC PANEL
ALT: 11 U/L — ABNORMAL LOW (ref 17–63)
AST: 15 U/L (ref 15–41)
Albumin: 3.4 g/dL — ABNORMAL LOW (ref 3.5–5.0)
Alkaline Phosphatase: 72 U/L (ref 38–126)
Anion gap: 9 (ref 5–15)
BUN: 20 mg/dL (ref 6–20)
CO2: 28 mmol/L (ref 22–32)
Calcium: 9 mg/dL (ref 8.9–10.3)
Chloride: 103 mmol/L (ref 101–111)
Creatinine, Ser: 1.39 mg/dL — ABNORMAL HIGH (ref 0.61–1.24)
GFR calc Af Amer: 56 mL/min — ABNORMAL LOW (ref >60)
GFR calc non Af Amer: 48 mL/min — ABNORMAL LOW (ref >60)
Glucose, Bld: 125 mg/dL — ABNORMAL HIGH (ref 65–99)
Potassium: 3.8 mmol/L (ref 3.5–5.1)
Sodium: 140 mmol/L (ref 135–145)
Total Bilirubin: 0.5 mg/dL (ref 0.3–1.2)
Total Protein: 7.8 g/dL (ref 6.5–8.1)

## 2017-06-27 LAB — CBG MONITORING, ED: Glucose-Capillary: 150 mg/dL — ABNORMAL HIGH (ref 65–99)

## 2017-06-27 LAB — TROPONIN I

## 2017-06-27 NOTE — ED Triage Notes (Signed)
Pt states he has been falling a lot lately, twice last week and once this morning  Pt states last week he hit his back on the bed frame and it has been hurting ever since  Pt states last night about 7 pm he was unable to stand and had weakness in his left leg  Pt denies injury from his fall this morning   Pt states both his feet are numb and that he is unable to get up by himself  Pt has hx of stroke  Pt has some difference in his smile on the left side of his mouth, unsure if this is new

## 2017-06-27 NOTE — ED Notes (Signed)
Patient transported to X-ray 

## 2017-06-28 ENCOUNTER — Other Ambulatory Visit: Payer: Self-pay

## 2017-06-28 ENCOUNTER — Observation Stay (HOSPITAL_COMMUNITY): Payer: Medicare Other

## 2017-06-28 ENCOUNTER — Encounter (HOSPITAL_COMMUNITY): Payer: Self-pay | Admitting: Internal Medicine

## 2017-06-28 ENCOUNTER — Observation Stay (HOSPITAL_BASED_OUTPATIENT_CLINIC_OR_DEPARTMENT_OTHER): Payer: Medicare Other

## 2017-06-28 DIAGNOSIS — M79672 Pain in left foot: Secondary | ICD-10-CM

## 2017-06-28 DIAGNOSIS — Z8673 Personal history of transient ischemic attack (TIA), and cerebral infarction without residual deficits: Secondary | ICD-10-CM

## 2017-06-28 DIAGNOSIS — M545 Low back pain, unspecified: Secondary | ICD-10-CM | POA: Diagnosis present

## 2017-06-28 DIAGNOSIS — G8929 Other chronic pain: Secondary | ICD-10-CM | POA: Diagnosis not present

## 2017-06-28 DIAGNOSIS — G8194 Hemiplegia, unspecified affecting left nondominant side: Secondary | ICD-10-CM | POA: Diagnosis not present

## 2017-06-28 DIAGNOSIS — S3992XA Unspecified injury of lower back, initial encounter: Secondary | ICD-10-CM | POA: Diagnosis not present

## 2017-06-28 DIAGNOSIS — M5440 Lumbago with sciatica, unspecified side: Secondary | ICD-10-CM | POA: Diagnosis not present

## 2017-06-28 DIAGNOSIS — I63519 Cerebral infarction due to unspecified occlusion or stenosis of unspecified middle cerebral artery: Secondary | ICD-10-CM | POA: Diagnosis not present

## 2017-06-28 DIAGNOSIS — M79671 Pain in right foot: Secondary | ICD-10-CM | POA: Diagnosis not present

## 2017-06-28 DIAGNOSIS — R609 Edema, unspecified: Secondary | ICD-10-CM

## 2017-06-28 LAB — URINALYSIS, ROUTINE W REFLEX MICROSCOPIC
Bilirubin Urine: NEGATIVE
Glucose, UA: NEGATIVE mg/dL
Hgb urine dipstick: NEGATIVE
Ketones, ur: NEGATIVE mg/dL
Leukocytes, UA: NEGATIVE
Nitrite: NEGATIVE
Protein, ur: NEGATIVE mg/dL
Specific Gravity, Urine: 1.016 (ref 1.005–1.030)
pH: 6 (ref 5.0–8.0)

## 2017-06-28 LAB — GLUCOSE, CAPILLARY
GLUCOSE-CAPILLARY: 101 mg/dL — AB (ref 65–99)
GLUCOSE-CAPILLARY: 150 mg/dL — AB (ref 65–99)
Glucose-Capillary: 98 mg/dL (ref 65–99)

## 2017-06-28 LAB — BASIC METABOLIC PANEL
ANION GAP: 9 (ref 5–15)
BUN: 20 mg/dL (ref 6–20)
CALCIUM: 8.8 mg/dL — AB (ref 8.9–10.3)
CO2: 26 mmol/L (ref 22–32)
Chloride: 102 mmol/L (ref 101–111)
Creatinine, Ser: 1.24 mg/dL (ref 0.61–1.24)
GFR calc Af Amer: >60 mL/min (ref >60)
GFR, EST NON AFRICAN AMERICAN: 56 mL/min — AB (ref >60)
Glucose, Bld: 113 mg/dL — ABNORMAL HIGH (ref 65–99)
POTASSIUM: 3.8 mmol/L (ref 3.5–5.1)
SODIUM: 137 mmol/L (ref 135–145)

## 2017-06-28 LAB — CBC
HEMATOCRIT: 30 % — AB (ref 39.0–52.0)
HEMOGLOBIN: 9.6 g/dL — AB (ref 13.0–17.0)
MCH: 27.2 pg (ref 26.0–34.0)
MCHC: 32 g/dL (ref 30.0–36.0)
MCV: 85 fL (ref 78.0–100.0)
Platelets: 204 10*3/uL (ref 150–400)
RBC: 3.53 MIL/uL — ABNORMAL LOW (ref 4.22–5.81)
RDW: 14.8 % (ref 11.5–15.5)
WBC: 6.3 10*3/uL (ref 4.0–10.5)

## 2017-06-28 MED ORDER — ASPIRIN EC 325 MG PO TBEC
325.0000 mg | DELAYED_RELEASE_TABLET | Freq: Every day | ORAL | Status: DC
  Administered 2017-06-28 – 2017-06-29 (×2): 325 mg via ORAL
  Filled 2017-06-28 (×2): qty 1

## 2017-06-28 MED ORDER — POTASSIUM CHLORIDE CRYS ER 20 MEQ PO TBCR
40.0000 meq | EXTENDED_RELEASE_TABLET | Freq: Once | ORAL | Status: AC
  Administered 2017-06-28: 40 meq via ORAL
  Filled 2017-06-28: qty 2

## 2017-06-28 MED ORDER — INFLUENZA VAC SPLIT HIGH-DOSE 0.5 ML IM SUSY
0.5000 mL | PREFILLED_SYRINGE | INTRAMUSCULAR | Status: AC
  Administered 2017-06-29: 0.5 mL via INTRAMUSCULAR
  Filled 2017-06-28: qty 0.5

## 2017-06-28 MED ORDER — PROSIGHT PO TABS
1.0000 | ORAL_TABLET | Freq: Two times a day (BID) | ORAL | Status: DC
  Administered 2017-06-28 – 2017-06-29 (×4): 1 via ORAL
  Filled 2017-06-28 (×4): qty 1

## 2017-06-28 MED ORDER — AMLODIPINE BESYLATE 5 MG PO TABS
10.0000 mg | ORAL_TABLET | Freq: Every day | ORAL | Status: DC
  Administered 2017-06-28 – 2017-06-29 (×2): 10 mg via ORAL
  Filled 2017-06-28 (×2): qty 2

## 2017-06-28 MED ORDER — ACETAMINOPHEN 650 MG RE SUPP: 650.0000 mg | Freq: Four times a day (QID) | RECTAL | Status: DC | PRN

## 2017-06-28 MED ORDER — ONDANSETRON HCL 4 MG/2ML IJ SOLN: 4.0000 mg | Freq: Four times a day (QID) | INTRAMUSCULAR | Status: DC | PRN

## 2017-06-28 MED ORDER — IRBESARTAN 150 MG PO TABS
150.0000 mg | ORAL_TABLET | Freq: Every day | ORAL | Status: DC
  Administered 2017-06-28 – 2017-06-29 (×2): 150 mg via ORAL
  Filled 2017-06-28 (×2): qty 1

## 2017-06-28 MED ORDER — INSULIN ASPART 100 UNIT/ML ~~LOC~~ SOLN: 0.0000 [IU] | Freq: Three times a day (TID) | SUBCUTANEOUS | Status: DC

## 2017-06-28 MED ORDER — DOXAZOSIN MESYLATE 8 MG PO TABS
8.0000 mg | ORAL_TABLET | Freq: Every day | ORAL | Status: DC
  Administered 2017-06-28: 8 mg via ORAL
  Filled 2017-06-28: qty 1

## 2017-06-28 MED ORDER — ACETAMINOPHEN 325 MG PO TABS
650.0000 mg | ORAL_TABLET | Freq: Four times a day (QID) | ORAL | Status: DC | PRN
  Administered 2017-06-28 – 2017-06-29 (×3): 650 mg via ORAL
  Filled 2017-06-28 (×3): qty 2

## 2017-06-28 MED ORDER — PREGABALIN 50 MG PO CAPS
100.0000 mg | ORAL_CAPSULE | Freq: Two times a day (BID) | ORAL | Status: DC
  Administered 2017-06-28 – 2017-06-29 (×4): 100 mg via ORAL
  Filled 2017-06-28 (×4): qty 2

## 2017-06-28 MED ORDER — ONDANSETRON HCL 4 MG PO TABS: 4.0000 mg | ORAL_TABLET | Freq: Four times a day (QID) | ORAL | Status: DC | PRN

## 2017-06-28 MED ORDER — TERBINAFINE HCL 250 MG PO TABS
250.0000 mg | ORAL_TABLET | Freq: Every day | ORAL | Status: DC
  Administered 2017-06-28 – 2017-06-29 (×2): 250 mg via ORAL
  Filled 2017-06-28 (×2): qty 1

## 2017-06-28 MED ORDER — LORAZEPAM 2 MG/ML IJ SOLN
2.0000 mg | INTRAMUSCULAR | Status: DC | PRN
  Administered 2017-06-28: 2 mg via INTRAVENOUS
  Filled 2017-06-28: qty 1

## 2017-06-28 NOTE — Consult Note (Signed)
NEURO HOSPITALIST CONSULT NOTE   Requestig physician: Dr. Ronalee Belts  Reason for Consult: "Worsened bilateral lower extremity weakness."   History obtained from:  Patient and Chart    HPI:                                                                                                                                          Luis Poole is an 76 y.o. male who presented to the [facility] ED with worsened ambulatory function on 11/9. At baseline he uses a walker most of the time due to chronic LLE weakness secondary to a prior right ACA ischemic infarction. 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   He had a fall 2 days prior to admission, losing his balance and falling onto his back. Initially did fine but 24 hours after the fall he developed increasing LBP with radiation to his LLE in conjunction with numbness to both lower extremities. This continued to worsen so he came to the ED. X-ray of L-spine in the ED showed no acute fracture. CT head and neck were unremarkable.   After admission, MRI brain was obtained revealing no acute infarction; old right ACA infarction was noted. MRI of his L-spine revealed slight interval regression of previously seen left L2-3 paracentral disc protrusion with no associated nerve root compression; the exam was otherwise relatively stable as compared to 2015. Right foraminal disc protrusion at L3-4, contacting and potentially irritating the right L3 nerve root was seen. Also noted were postsurgical findings status post PLIF at L5-S1 without residual or recurrent stenosis.   On interview at the bedside this evening, the patient reveals that most of his new ambulatory dysfunction has been due to severe pain in his heels and balls of his feet when putting pressure on them while standing. He attributes this to increased swelling in his feet. He also states that he has a history of gout (not listed in Epic) and  feels that he may be having a flare up of this affecting the toes of his right foot, with pain of his right big toe being especially prominent. He states that 90% of his new ambulatory dysfunction is secondary to worsened foot pain bilaterally.   Past Medical History:  Diagnosis Date  . Anemia, unspecified 06/08/2013  . Cataract   . CHF (congestive heart failure) (HCC)   . Chronic kidney disease   . Diabetes mellitus   . Hypertension   . S/P lumbar fusion   . Stroke [facility])     Past Surgical History:  Procedure Laterality Date  . CATARACT EXTRACTION    . EYE SURGERY    . KNEE SURGERY  2006  . NECK SURGERY  2012  . SPINE SURGERY  2009  . TONSILECTOMY,  ADENOIDECTOMY, BILATERAL MYRINGOTOMY AND TUBES      Family History  Problem Relation Age of Onset  . Stroke Mother    Social History:  reports that he has quit smoking. He has quit using smokeless tobacco. He reports that he does not drink alcohol or use drugs.  Allergies  Allergen Reactions  . Lisinopril Anaphylaxis  . Gabapentin Other (See Comments)    Dizziness    MEDICATIONS:                                                                                                                     Prior to Admission:  Medications Prior to Admission  Medication Sig Dispense Refill Last Dose  . acetaminophen (TYLENOL) 650 MG CR tablet Take 1 tablet (650 mg total) by mouth 2 (two) times daily. (Patient taking differently: Take 1,300 mg by mouth every 8 (eight) hours as needed for pain. ) 30 tablet 0 Past Week at Unknown time  . amLODipine (NORVASC) 10 MG tablet Take 10 mg by mouth daily.    06/27/2017 at Unknown time  . aspirin buffered (BUFFERIN) 325 MG TABS tablet Take 325 mg by mouth daily.   06/27/2017 at 0900  . doxazosin (CARDURA) 8 MG tablet Take 8 mg by mouth at bedtime.    06/26/2017 at Unknown time  . multivitamin-lutein (OCUVITE-LUTEIN) CAPS capsule Take 1 capsule by mouth 2 (two) times daily.   06/27/2017 at Unknown time  .  pregabalin (LYRICA) 100 MG capsule Take 100 mg by mouth 2 (two) times daily.   06/27/2017 at Unknown time  . terbinafine (LAMISIL) 250 MG tablet Take 250 mg by mouth daily.  0 06/27/2017 at Unknown time  . triamterene-hydrochlorothiazide (MAXZIDE-25) 37.5-25 MG per tablet Take 1 tablet by mouth daily.    06/27/2017 at Unknown time  . valsartan (DIOVAN) 160 MG tablet Take 160 mg daily by mouth.   06/27/2017 at Unknown time  . oxyCODONE-acetaminophen (PERCOCET) 5-325 MG tablet Take 1 tablet by mouth every 4 (four) hours as needed for moderate pain. (Patient not taking: Reported on 06/27/2017) 15 tablet 0 Completed Course at Unknown time  . potassium chloride SA (K-DUR,KLOR-CON) 20 MEQ tablet Take 1 tablet (20 mEq total) by mouth 2 (two) times daily. 10 tablet 0    Scheduled: . amLODipine  10 mg Oral Daily  . aspirin EC  325 mg Oral Daily  . doxazosin  8 mg Oral QHS  . [START ON 06/29/2017] Influenza vac split quadrivalent PF  0.5 mL Intramuscular Tomorrow-1000  . insulin aspart  0-9 Units Subcutaneous TID WC  . irbesartan  150 mg Oral Daily  . multivitamin  1 tablet Oral BID  . pregabalin  100 mg Oral BID  . terbinafine  250 mg Oral Daily     ROS:  Severe bilateral foot pain. Other ROS as per HPI.   Blood pressure 124/63, pulse 70, temperature 98.3 F (36.8 C), temperature source Oral, resp. rate 18, height 5\' 7"  (1.702 m), weight 97.3 kg (214 lb 8.1 oz), SpO2 100 %.  General Examination:                                                                                                      HEENT-  [place]/AT    Lungs- Respirations unlabored Extremities- Trophic changes of lower extremities below knees. Onychomycosis bilaterally. Severe tenderness to palpation balls of feet bilaterally and toes of right foot.   Neurological Examination Mental Status:  Alert and fully  oriented. Pleasant and cooperative. Able to follow all commands. Speech fluent with intact comprehension and naming. Cranial Nerves: II: Visual fields intact to confrontation testing bilaterally. PERRL.   III,IV, VI: No ptosis. EOMI without nystagmus.  V,VII: Smile symmetric, facial temp sensation normal bilaterally VIII: hearing intact to voice IX,X: palate rises symmetrically XI: Slight lag on the left to shoulder shrug XII: midline tongue extension Motor: RUE: 5/5 proximal and distal LUE: 4/5 proximal and distal RLE: Able to raise antigravity and resist examiner with 4/5 HF, 4+/5 KE, 4/5 knee flexion. Severe pain when attempting to test ankle dorsiflexion due to tenderness to palpation.  LLE: Unable to raise antigravity at hip. 3/5 knee extension, 3/5 knee flexion, 4/5 ADF/APF Sensory: Decreased FT and temp sensation to LLE Deep Tendon Reflexes: 1+ bilateral biceps and brachioradialis. Hypoactive patellar reflexes at  Plantars: Deferred due to severe bilateral foot pain, right worse than left.  Cerebellar: No ataxia with FNF bilaterally.  Gait: Unable to ambulate  Lab Results: Basic Metabolic Panel: Recent Labs  Lab 06/27/17 2143 06/28/17 0630  NA 140 137  K 3.8 3.8  CL 103 102  CO2 28 26  GLUCOSE 125* 113*  BUN 20 20  CREATININE 1.39* 1.24  CALCIUM 9.0 8.8*    Liver Function Tests: Recent Labs  Lab 06/27/17 2143  AST 15  ALT 11*  ALKPHOS 72  BILITOT 0.5  PROT 7.8  ALBUMIN 3.4*   No results for input(s): LIPASE, AMYLASE in the last 168 hours. No results for input(s): AMMONIA in the last 168 hours.  CBC: Recent Labs  Lab 06/27/17 2143 06/28/17 0630  WBC 6.8 6.3  NEUTROABS 3.9  --   HGB 9.8* 9.6*  HCT 30.8* 30.0*  MCV 85.1 85.0  PLT 214 204    Cardiac Enzymes: Recent Labs  Lab 06/27/17 2143  TROPONINI <0.03    Lipid Panel: No results for input(s): CHOL, TRIG, HDL, CHOLHDL, VLDL, LDLCALC in the last 168 hours.  CBG: Recent Labs  Lab  06/27/17 1915 06/28/17 0747 06/28/17 1715  GLUCAP 150* 101* 98    Microbiology: Results for orders placed or performed during the hospital encounter of 05/23/11  Urine culture     Status: None   Collection Time: 05/23/11  3:42 AM  Result Value Ref Range Status   Specimen Description URINE, CATHETERIZED  Final   Special Requests NONE  Final  Culture  Setup Time [id number]  Final   Colony Count NO GROWTH  Final   Culture NO GROWTH  Final   Report Status 05/24/2011 FINAL  Final  MRSA PCR Screening     Status: None   Collection Time: 05/23/11  5:07 PM  Result Value Ref Range Status   MRSA by PCR NEGATIVE NEGATIVE Final    Comment:        The GeneXpert MRSA Assay (FDA approved for NASAL specimens only), is one component of a comprehensive MRSA colonization surveillance program. It is not intended to diagnose MRSA infection nor to guide or monitor treatment for MRSA infections.    Coagulation Studies: No results for input(s): LABPROT, INR in the last 72 hours.  Imaging: Dg Chest 2 View  Result Date: 06/27/2017 CLINICAL DATA:  Fall, weakness EXAM: CHEST  2 VIEW COMPARISON:  02/01/2016 FINDINGS: Stable mild kyphosis of the spine with minimal contiguous thoracic wedging. Postsurgical changes of the cervical spine. No acute consolidation or effusion. Normal cardiomediastinal silhouette. No pneumothorax. IMPRESSION: No active cardiopulmonary disease. Electronically Signed   By: Jasmine PangKim  Fujinaga M.D.   On: 06/27/2017 23:02   Dg Lumbar Spine Complete  Result Date: 06/27/2017 CLINICAL DATA:  Fall EXAM: LUMBAR SPINE - COMPLETE 4+ VIEW COMPARISON:  02/21/2017 FINDINGS: Metallic fragments over the right hip. Posterior stabilization rods and fixating screws at L5-S1 with interbody device. Moderate degenerative changes at L2-L3 with mild changes at L3-L4. Vertebral body heights are normal. IMPRESSION: Stable postsurgical changes at L5-S1.  No acute osseous abnormality Electronically Signed    By: Jasmine PangKim  Fujinaga M.D.   On: 06/27/2017 23:06   Ct Head Wo Contrast  Result Date: 06/27/2017 CLINICAL DATA:  Numerous false lately, twice last week and once this morning. Weakness in the left leg with lower extremity numbness. EXAM: CT HEAD WITHOUT CONTRAST CT CERVICAL SPINE WITHOUT CONTRAST TECHNIQUE: Multidetector CT imaging of the head and cervical spine was performed following the standard protocol without intravenous contrast. Multiplanar CT image reconstructions of the cervical spine were also generated. COMPARISON:  Head CT 08/29/2012 FINDINGS: CT HEAD FINDINGS Brain: Chronic encephalomalacia near the vertex of the brain reflecting chronic posterior right ACA territory infarct. Chronic partially empty pituitary sella. Chronic small vessel ischemic disease of periventricular white matter. No large vascular territory infarct, hemorrhage or midline shift. No intra-axial mass nor extra-axial fluid collections. Vascular: No hyperdense vessel or unexpected calcification. Skull: No acute osseous abnormality. Sinuses/Orbits: Mild ethmoid sinus mucosal thickening. The remainder of the paranasal sinuses appear clear. Left lens replacement. Other: Chronic left mastoid effusion CT CERVICAL SPINE FINDINGS Alignment: Slight reversal of cervical lordosis secondary to anterior cervical disc fusion spanning C5 through C7 with posterior cerclage wires across the spinous processes. Intact atlantodental interval and craniocervical relationship. Skull base and vertebrae: Bony incorporation status post ACDF across the C5 through C7 interspaces. No acute vertebral body fracture. No significant listhesis. Soft tissues and spinal canal: No prevertebral soft tissue swelling. Disc levels: Mild disc space narrowing C2 through C5 and at C7-T1. No significant central or neural foraminal narrowing. No jumped or perched facets. Upper chest: Negative. Other: None IMPRESSION: 1. 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 2.  Chronic small vessel ischemia. No acute intracranial abnormality. 3. Chronic left mastoid effusion. 4. ACDF with bony fusion across the C5 through C7 interspaces. No acute cervical spine fracture or posttraumatic subluxation. Electronically Signed   By: Tollie Ethavid  Kwon M.D.   On: 06/27/2017 23:22   Ct Cervical Spine Wo Contrast  Result Date: 06/27/2017 CLINICAL DATA:  Numerous false lately, twice last week and once this morning. Weakness in the left leg with lower extremity numbness. EXAM: CT HEAD WITHOUT CONTRAST CT CERVICAL SPINE WITHOUT CONTRAST TECHNIQUE: Multidetector CT imaging of the head and cervical spine was performed following the standard protocol without intravenous contrast. Multiplanar CT image reconstructions of the cervical spine were also generated. COMPARISON:  Head CT 08/29/2012 FINDINGS: CT HEAD FINDINGS Brain: Chronic encephalomalacia near the vertex of the brain reflecting chronic posterior right ACA territory infarct. Chronic partially empty pituitary sella. Chronic small vessel ischemic disease of periventricular white matter. No large vascular territory infarct, hemorrhage or midline shift. No intra-axial mass nor extra-axial fluid collections. Vascular: No hyperdense vessel or unexpected calcification. Skull: No acute osseous abnormality. Sinuses/Orbits: Mild ethmoid sinus mucosal thickening. The remainder of the paranasal sinuses appear clear. Left lens replacement. Other: Chronic left mastoid effusion CT CERVICAL SPINE FINDINGS Alignment: Slight reversal of cervical lordosis secondary to anterior cervical disc fusion spanning C5 through C7 with posterior cerclage wires across the spinous processes. Intact atlantodental interval and craniocervical relationship. Skull base and vertebrae: Bony incorporation status post ACDF across the C5 through C7 interspaces. No acute vertebral body fracture. No significant listhesis. Soft tissues and spinal canal: No prevertebral soft tissue swelling.  Disc levels: Mild disc space narrowing C2 through C5 and at C7-T1. No significant central or neural foraminal narrowing. No jumped or perched facets. Upper chest: Negative. Other: None IMPRESSION: 1. 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 2. Chronic small vessel ischemia. No acute intracranial abnormality. 3. Chronic left mastoid effusion. 4. ACDF with bony fusion across the C5 through C7 interspaces. No acute cervical spine fracture or posttraumatic subluxation. Electronically Signed   By: Tollie Eth M.D.   On: 06/27/2017 23:22   Mr Brain Wo Contrast  Result Date: 06/28/2017 CLINICAL DATA:  Focal neurologic deficit greater than 6 hours, suspect stroke EXAM: MRI HEAD WITHOUT CONTRAST TECHNIQUE: Multiplanar, multiecho pulse sequences of the brain and surrounding structures were obtained without intravenous contrast. COMPARISON:  CT head 06/27/2017 FINDINGS: Brain: Negative for acute infarct. Chronic hemorrhagic infarct distal right anterior cerebral artery territory. Negative for mass or edema. Negative for hydrocephalus. Vascular: Normal arterial flow voids Skull and upper cervical spine: Negative Sinuses/Orbits: Mild mucosal edema paranasal sinuses. Left mastoid effusion. Left cataract removal Other: None IMPRESSION: Negative for acute infarct. Chronic right anterior cerebral artery infarct. Electronically Signed   By: Marlan Palau M.D.   On: 06/28/2017 12:31   Mr Lumbar Spine Wo Contrast  Result Date: 06/28/2017 CLINICAL DATA:  Initial evaluation for acute back pain, numerous falls lately, weakness in left leg with lower extremity numbness. EXAM: MRI LUMBAR SPINE WITHOUT CONTRAST TECHNIQUE: Multiplanar, multisequence MR imaging of the lumbar spine was performed. No intravenous contrast was administered. COMPARISON:  Prior radiograph from 06/27/2017 as well as previous MRI from 02/27/2014. FINDINGS: Segmentation: Normal segmentation. Lowest well-formed disc labeled the L5-S1 level.  Alignment: Chronic 6 mm anterolisthesis of L5 on S1, stable. Mild levoscoliosis. Vertebrae: Patient status post PLIF at L5-S1. Vertebral body heights maintained. No evidence for acute or interval fracture. Small benign hemangioma noted within the superior aspect of the L1 vertebral body. No worrisome osseous lesions. Bone marrow signal intensity within normal limits. Conus medullaris: Extends to the L1 level and appears normal. Paraspinal and other soft tissues: Chronic postoperative changes present within the lower paraspinous soft tissues. Visualized visceral structures within normal limits. Disc levels: L1-2: Disc desiccation with minimal annular disc  bulge. Mild facet and ligamentum flavum hypertrophy. No significant canal or foraminal stenosis. No neural impingement. L2-3: Diffuse disc bulge with disc desiccation and mild intervertebral disc space narrowing. Disc bulging canal fairly circumferential in appearance, with some interval regression of previously seen left paracentral disc protrusion. Moderate facet and ligament flavum hypertrophy. Small effusion noted on the left. Mild canal and lateral recess stenosis. Mild bilateral L2 foraminal narrowing without neural impingement. L3-4: Disc bulge with disc desiccation. Superimposed right foraminal/ extraforaminal disc protrusion, contacting the exiting right L3 nerve root in the right neural foramen (series 4, image 4). Moderate facet and ligamentum flavum hypertrophy. Mild canal stenosis. Mild right L3 foraminal stenosis. L4-5: Minimal disc bulge. Moderate facet degeneration with associated spurring. Mild to moderate bilateral L4 foraminal stenosis, left slightly worse than right, similar to previous. Central canal remains widely patent. L5-S1:  Status post PLIF.  No residual stenosis. IMPRESSION: 1. Slight interval regression of previously seen left L2-3 paracentral disc protrusion. No associated nerve root compression. 2. Otherwise relatively stable exam as  compared to 2015. No acute abnormality identified. 3. Right foraminal disc protrusion at L3-4, contacting and potentially irritating the right L3 nerve root. 4. Status post PLIF at L5-S1 without residual or recurrent stenosis. Electronically Signed   By: Rise MuBenjamin  McClintock M.D.   On: 06/28/2017 14:16    Assessment: 76 year old male with lower extremity weakness, foot pain and unstable gait. 1. At baseline, the patient has LLE weakness secondary to old right ACA stroke. Exam shows findings consistent with this as well as peripheral neuropathy and severe tenderness to palpation of toes and plantar aspects of feet, right worse than left.  2. MRI brain shows no acute changes. Old right ACA infarction is noted. 3. MRI L-spine shows slight interval regression of previously seen left L2-3 paracentral disc protrusion with no associated nerve root compression; the exam was otherwise relatively stable as compared to 2015. Right foraminal disc protrusion at L3-4, contacting and potentially irritating the right L3 nerve root is seen. Also noted are postsurgical findings status post PLIF at L5-S1 without residual or recurrent stenosis.  4. Exam reveals TTP of plantar aspects of both feet, as well as tenderness to palpation of his toes on the right. The patient states that 90% of his worsened ambulatory function is secondary to new onset foot pain.   Recommendations: 1. The patient states that 90% of his recently worsened ambulatory function is secondary to new onset foot pain. Will need podiatry or orthopedics consult and plain films of his feet to assess for possible fracture.  2. PT consult for ambulation.  3. Pain management regarding foot pain if plain films are negative for fracture. 4. Assess for possible gout. Patient states he has gout involving toes of his right foot, but this is not listed in Epic.  5. Continue ASA for secondary stroke prevention.  6. Will sign off. Please call if there are additional  questions.   Electronically signed: Dr. Caryl PinaEric Lahoma Constantin 06/28/2017, 9:16 PM

## 2017-06-28 NOTE — Progress Notes (Signed)
*  PRELIMINARY RESULTS* Vascular Ultrasound Bilateral lower extremity venous duplex has been completed.  Preliminary findings: No evidence of deep vein thrombosis in the visualized veins or baker's cysts bilaterally. Prominent lymph nodes noted bilaterally in the groin.   Luis FischerCharlotte C Aarionna Germer 06/28/2017, 10:49 AM

## 2017-06-28 NOTE — Progress Notes (Signed)
Patient was seen and examined after midnight.  Please see H&P for details. 76 year old gentleman with history of a stroke with left-sided hemiparesis, diabetes type 2, hypertension presented with severe back pain with lower extremities numbness.  Denied bowel or urinary incontinence.  Neurology was contacted on admission recommended MRI.  This morning MRI was not able to do because of claustrophobia.  I ordered Ativan before the procedure.  Plan for MRI of the spine today.  Continue supportive care.  Chronic kidney disease is stable.  PT/OT evaluation. DVT prophylaxis with SCDs until MRI results.

## 2017-06-28 NOTE — Progress Notes (Addendum)
PT Cancellation Note  Patient Details Name: Luis OdeaRobert P Poole MRN: [id number] DOB: 04/13/1941   Cancelled Treatment:    Reason Eval/Treat Not Completed: Other (comment) Pt scheduled for MRI this AM.  Will await results and check back on pt later today.  Addendum: MRI completed and pt had been sedated due to claustrophobia. Will check on pt tomorrow pending results of MRI.   Enzo MontgomeryKaren L Zailyn Rowser 06/28/2017, 8:37 AM

## 2017-06-28 NOTE — ED Provider Notes (Signed)
[facility]-EMERGENCY DEPT Provider Note   CSN: [id number] Arrival date & time: 06/27/17  1908     History   Chief Complaint Chief Complaint  Patient presents with  . Fall    HPI Luis Poole is a 76 y.o. male Luis Poole is a 76 y.o. 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 States that every time he tries to stand up and ambulate, his legs gave out and they feel very weak, resulting in multiple falls.  He has residual weakness in his left lower extremity secondary to CVA, but states he feels weaker with worsening numbness in his bilateral lower extremities.  Patient is a poor historian and is unable to tell me if this is acute or ongoing due to his diabetic neuropathy.  States he has fallen at least 3 times in the past 2 days.  Denies hitting his head or loss of consciousness.  Does endorse lightheadedness prior to falls.  Denies headache.  Also states he found it slightly more difficult to swallow while eating yesterday.  Also complaining of acute on chronic low back pain. States pain radiates down his left leg but is now radiating down his right leg additionally.  No aggravating or alleviating factors noted.  Endorses bilateral lower extremity swelling which has improved with elevation.  Denies chest pain, shortness of breath, abdominal pain, nausea, or vomiting.  Ambulates with a wheelchair and a walker at baseline.  Has not tried anything for his symptoms.    The history is provided by the patient.    Past Medical History:  Diagnosis Date  . Anemia, unspecified 06/08/2013  . Cataract   . Diabetes mellitus   . Hypertension   . S/P lumbar fusion   . Stroke [facility])     Patient Active Problem List   Diagnosis Date  Noted  . Leg swelling 08/23/2013  . Left leg swelling 08/23/2013  . Neuropathy 08/23/2013  . Acute gout 08/23/2013  . D-dimer, elevated 08/23/2013  . CKD (chronic kidney disease), stage III (HCC) 08/23/2013  . Chronic diastolic heart failure (HCC) 08/23/2013  . Anemia 06/08/2013  . Hypertension 08/29/2012  . Diabetes mellitus type 2 in nonobese (HCC) 08/29/2012  . History of CVA (cerebrovascular accident) 08/28/2012    Class: Acute  . L-S radiculopathy 08/28/2012  . Lumbar post-laminectomy syndrome 01/15/2012  . Thoracic or lumbosacral neuritis or radiculitis, unspecified 01/15/2012  . Left hemiparesis (HCC) 01/15/2012  . Diabetic peripheral neuropathy (HCC) 01/15/2012    Past Surgical History:  Procedure Laterality Date  . CATARACT EXTRACTION    . EYE SURGERY    . KNEE SURGERY  2006  . NECK SURGERY  2012  . SPINE SURGERY  2009  . TONSILECTOMY, ADENOIDECTOMY, BILATERAL MYRINGOTOMY AND TUBES         Home Medications    Prior to Admission medications   Medication Sig Start Date End Date Taking? Authorizing Provider  acetaminophen (TYLENOL) 650 MG CR tablet Take 1 tablet (650 mg total) by mouth 2 (two) times daily. Patient taking differently: Take 1,300 mg by mouth every 8 (eight) hours as needed for pain.  02/22/17  Yes Nanavati, Ankit, MD  amLODipine (NORVASC) 10 MG tablet Take 10 mg by mouth daily.    Yes [provider]  aspirin buffered (BUFFERIN) 325 MG  TABS tablet Take 325 mg by mouth daily.   Yes [provider]  doxazosin (CARDURA) 8 MG tablet Take 8 mg by mouth at bedtime.    Yes [provider]  multivitamin-lutein (OCUVITE-LUTEIN) CAPS capsule Take 1 capsule by mouth 2 (two) times daily.   Yes [provider]  pregabalin (LYRICA) 100 MG capsule Take 100 mg by mouth 2 (two) times daily.   Yes [provider]  terbinafine (LAMISIL) 250 MG tablet Take 250 mg by mouth daily. 05/14/17  Yes [provider]    triamterene-hydrochlorothiazide (MAXZIDE-25) 37.5-25 MG per tablet Take 1 tablet by mouth daily.    Yes [provider]  valsartan (DIOVAN) 160 MG tablet Take 160 mg daily by mouth.   Yes [provider]  oxyCODONE-acetaminophen (PERCOCET) 5-325 MG tablet Take 1 tablet by mouth every 4 (four) hours as needed for moderate pain. Patient not taking: Reported on 06/27/2017 05/26/17   Dione Booze, MD  potassium chloride SA (K-DUR,KLOR-CON) 20 MEQ tablet Take 1 tablet (20 mEq total) by mouth 2 (two) times daily. 05/26/17   Dione Booze, MD    Family History Family History  Problem Relation Age of Onset  . Stroke Mother     Social History Social History   Tobacco Use  . Smoking status: Former Games developer  . Smokeless tobacco: Former Engineer, water Use Topics  . Alcohol use: No  . Drug use: No     Allergies   Lisinopril and Gabapentin   Review of Systems Review of Systems  Constitutional: Negative for chills and fever.  Eyes: Negative for visual disturbance.  Respiratory: Negative for shortness of breath.   Cardiovascular: Positive for leg swelling. Negative for chest pain.  Gastrointestinal: Negative for abdominal pain, nausea and vomiting.  Musculoskeletal: Positive for back pain and neck pain.  Neurological: Positive for weakness, light-headedness and numbness. Negative for syncope, facial asymmetry and headaches.  All other systems reviewed and are negative.    Physical Exam Updated Vital Signs BP 129/79   Pulse 79   Temp 98.2 F (36.8 C)   Resp 15   SpO2 100%   Physical Exam  Constitutional: He is oriented to person, place, and time. He appears well-developed and well-nourished. No distress.  HENT:  Head: Normocephalic and atraumatic.  Eyes: Conjunctivae are normal. Right eye exhibits no discharge. Left eye exhibits no discharge.  Neck: Normal range of motion. Neck supple. No JVD present. No tracheal deviation present.  Cardiovascular: Normal rate,  regular rhythm, normal heart sounds and intact distal pulses.  2+ radial pulses bilaterally, 1+ DP/PT pulses bilaterally.  2+ nonpitting edema to the bilateral lower extremities, Homan sign absent bilaterally   Pulmonary/Chest: Effort normal and breath sounds normal.  Abdominal: Soft. Bowel sounds are normal. He exhibits no distension. There is no tenderness.  Musculoskeletal: He exhibits no edema.  Decreased range of motion of the left shoulder.  Patient states this is chronic secondary to rotator cuff repair.  Diffuse midline and paraspinal muscle tenderness of the entire spine with no focal tenderness.  No deformity, crepitus, or step-off noted.  3/5 strength of the left lower extremity, patient states this is likely chronic secondary to CVA and lumbar spine surgery.  4+/5 strength of right lower extremity.    Neurological: He is alert and oriented to person, place, and time. A sensory deficit is present. He exhibits normal muscle tone.  Mental Status:  Alert, thought content appropriate, able to give a coherent history. Speech fluent without  evidence of aphasia. Able to follow 2 step commands without difficulty.  Cranial Nerves:  II:  Peripheral visual fields grossly normal, pupils equal, round, reactive to light III,IV, VI: ptosis not present, extra-ocular motions intact bilaterally  V,VII: smile symmetric, facial light touch sensation equal VIII: hearing grossly normal to voice  X: uvula elevates symmetrically  XI: bilateral shoulder shrug symmetric and strong XII: midline tongue extension without fassiculations Sensory: Decreased sensation of the left upper extremity in the ulnar distribution, generalized decreased sensation of the left lower extremity as compared to the right. Cerebellar: normal finger-to-nose with bilateral upper extremities   Gait: patient refuses to ambulate DTR: 2+ BUE, 2+ R patella, unable to eliciti in left patells 2/2 knee replacement, 1+ achilles bilaterally     Skin: Skin is warm and dry. No erythema.  Psychiatric: He has a normal mood and affect. His behavior is normal.  Nursing note and vitals reviewed.    ED Treatments / Results  Labs (all labs ordered are listed, but only abnormal results are displayed) Labs Reviewed  CBC WITH DIFFERENTIAL/PLATELET - Abnormal; Notable for the following components:      Result Value   RBC 3.62 (*)    Hemoglobin 9.8 (*)    HCT 30.8 (*)    All other components within normal limits  COMPREHENSIVE METABOLIC PANEL - Abnormal; Notable for the following components:   Glucose, Bld 125 (*)    Creatinine, Ser 1.39 (*)    Albumin 3.4 (*)    ALT 11 (*)    GFR calc non Af Amer 48 (*)    GFR calc Af Amer 56 (*)    All other components within normal limits  CBG MONITORING, ED - Abnormal; Notable for the following components:   Glucose-Capillary 150 (*)    All other components within normal limits  URINE CULTURE  TROPONIN I  URINALYSIS, ROUTINE W REFLEX MICROSCOPIC    EKG  EKG Interpretation  Date/Time:  Friday June 27 2017 23:17:43 EST Ventricular Rate:  77 PR Interval:    QRS Duration: 148 QT Interval:  419 QTC Calculation: 475 R Axis:   13 Text Interpretation:  Sinus rhythm Right bundle branch block Baseline wander in lead(s) V4 RBBB not seen previously Confirmed by Paula LibraMolpus, John ([id number]) on 06/28/2017 1:23:23 AM       Radiology Dg Chest 2 View  Result Date: 06/27/2017 CLINICAL DATA:  Fall, weakness EXAM: CHEST  2 VIEW COMPARISON:  02/01/2016 FINDINGS: Stable mild kyphosis of the spine with minimal contiguous thoracic wedging. Postsurgical changes of the cervical spine. No acute consolidation or effusion. Normal cardiomediastinal silhouette. No pneumothorax. IMPRESSION: No active cardiopulmonary disease. Electronically Signed   By: Jasmine PangKim  Fujinaga M.D.   On: 06/27/2017 23:02   Dg Lumbar Spine Complete  Result Date: 06/27/2017 CLINICAL DATA:  Fall EXAM: LUMBAR SPINE - COMPLETE 4+ VIEW  COMPARISON:  02/21/2017 FINDINGS: Metallic fragments over the right hip. Posterior stabilization rods and fixating screws at L5-S1 with interbody device. Moderate degenerative changes at L2-L3 with mild changes at L3-L4. Vertebral body heights are normal. IMPRESSION: Stable postsurgical changes at L5-S1.  No acute osseous abnormality Electronically Signed   By: Jasmine PangKim  Fujinaga M.D.   On: 06/27/2017 23:06   Ct Head Wo Contrast  Result Date: 06/27/2017 CLINICAL DATA:  Numerous false lately, twice last week and once this morning. Weakness in the left leg with lower extremity numbness. EXAM: CT HEAD WITHOUT CONTRAST CT CERVICAL SPINE WITHOUT CONTRAST TECHNIQUE: Multidetector CT imaging of the head  and cervical spine was performed following the standard protocol without intravenous contrast. Multiplanar CT image reconstructions of the cervical spine were also generated. COMPARISON:  Head CT 08/29/2012 FINDINGS: CT HEAD FINDINGS Brain: Chronic encephalomalacia near the vertex of the brain reflecting chronic posterior right ACA territory infarct. Chronic partially empty pituitary sella. Chronic small vessel ischemic disease of periventricular white matter. No large vascular territory infarct, hemorrhage or midline shift. No intra-axial mass nor extra-axial fluid collections. Vascular: No hyperdense vessel or unexpected calcification. Skull: No acute osseous abnormality. Sinuses/Orbits: Mild ethmoid sinus mucosal thickening. The remainder of the paranasal sinuses appear clear. Left lens replacement. Other: Chronic left mastoid effusion CT CERVICAL SPINE FINDINGS Alignment: Slight reversal of cervical lordosis secondary to anterior cervical disc fusion spanning C5 through C7 with posterior cerclage wires across the spinous processes. Intact atlantodental interval and craniocervical relationship. Skull base and vertebrae: Bony incorporation status post ACDF across the C5 through C7 interspaces. No acute vertebral body  fracture. No significant listhesis. Soft tissues and spinal canal: No prevertebral soft tissue swelling. Disc levels: Mild disc space narrowing C2 through C5 and at C7-T1. No significant central or neural foraminal narrowing. No jumped or perched facets. Upper chest: Negative. Other: None IMPRESSION: 1. 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 2. Chronic small vessel ischemia. No acute intracranial abnormality. 3. Chronic left mastoid effusion. 4. ACDF with bony fusion across the C5 through C7 interspaces. No acute cervical spine fracture or posttraumatic subluxation. Electronically Signed   By: Tollie Eth M.D.   On: 06/27/2017 23:22   Ct Cervical Spine Wo Contrast  Result Date: 06/27/2017 CLINICAL DATA:  Numerous false lately, twice last week and once this morning. Weakness in the left leg with lower extremity numbness. EXAM: CT HEAD WITHOUT CONTRAST CT CERVICAL SPINE WITHOUT CONTRAST TECHNIQUE: Multidetector CT imaging of the head and cervical spine was performed following the standard protocol without intravenous contrast. Multiplanar CT image reconstructions of the cervical spine were also generated. COMPARISON:  Head CT 08/29/2012 FINDINGS: CT HEAD FINDINGS Brain: Chronic encephalomalacia near the vertex of the brain reflecting chronic posterior right ACA territory infarct. Chronic partially empty pituitary sella. Chronic small vessel ischemic disease of periventricular white matter. No large vascular territory infarct, hemorrhage or midline shift. No intra-axial mass nor extra-axial fluid collections. Vascular: No hyperdense vessel or unexpected calcification. Skull: No acute osseous abnormality. Sinuses/Orbits: Mild ethmoid sinus mucosal thickening. The remainder of the paranasal sinuses appear clear. Left lens replacement. Other: Chronic left mastoid effusion CT CERVICAL SPINE FINDINGS Alignment: Slight reversal of cervical lordosis secondary to anterior cervical disc fusion spanning C5  through C7 with posterior cerclage wires across the spinous processes. Intact atlantodental interval and craniocervical relationship. Skull base and vertebrae: Bony incorporation status post ACDF across the C5 through C7 interspaces. No acute vertebral body fracture. No significant listhesis. Soft tissues and spinal canal: No prevertebral soft tissue swelling. Disc levels: Mild disc space narrowing C2 through C5 and at C7-T1. No significant central or neural foraminal narrowing. No jumped or perched facets. Upper chest: Negative. Other: None IMPRESSION: 1. 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 2. Chronic small vessel ischemia. No acute intracranial abnormality. 3. Chronic left mastoid effusion. 4. ACDF with bony fusion across the C5 through C7 interspaces. No acute cervical spine fracture or posttraumatic subluxation. Electronically Signed   By: Tollie Eth M.D.   On: 06/27/2017 23:22    Procedures Procedures (including critical care time)  Medications Ordered in ED Medications  potassium chloride SA (K-DUR,KLOR-CON)  CR tablet 40 mEq (40 mEq Oral Given 06/28/17 0039)     Initial Impression / Assessment and Plan / ED Course  I have reviewed the triage vital signs and the nursing notes.  Pertinent labs & imaging results that were available during my care of the patient were reviewed by me and considered in my medical decision making (see chart for details).     Patient presents with repeated falls in the past 2 days complaining of worsening numbness and weakness of his lower extremities.  Afebrile, vital signs are stable.  He has significant weakness of his left lower extremity, mild weakness of the right lower extremity.  It is unclear if this is new.  Nurse made a note of left-sided facial droop which is subtle if at all present.  Lab work appears to be at patient's baseline. EKG shows ne RBBB but no ST segment abnormality.  Imaging of the chest shows no acute cardiopulmonary  abnormality.  Imaging of the lumbar spine shows no acute osseous abnormality.  He has no red flag signs concerning for cauda equina or spinal cord compression.  Imaging of the head and neck show chronic changes.  He refuses to ambulate and states that his legs feel weak and will give out if he tries.  Spoke with Dr. Otelia LimesLindzen who recommends medical admission for  with neurology stroke workup. Spoke with hospitalist Dr. Toniann FailKakrakandy who agrees to assume care of patient and bring him into the hospital for further workup.  Final Clinical Impressions(s) / ED Diagnoses   Final diagnoses:  Repeated falls    ED Discharge Orders    None       Jeanie SewerFawze, Shondrea Steinert A, PA-C 06/28/17 0126    Samuel JesterMcManus, Kathleen, DO 07/01/17 [phone]

## 2017-06-28 NOTE — Progress Notes (Signed)
Patient complain of being claustrophobic and is scheduled for MRI.  Dr. Ronalee BeltsBhandari notified via text page.

## 2017-06-28 NOTE — H&P (Signed)
History and Physical    Luis OdeaRobert P Arnall ZOX:[id number] DOB: 10/07/1940 DOA: 06/27/2017  PCP: Renaye RakersBland, Veita, MD  Patient coming from: Home.  Chief Complaint: Low back pain and lower extremity numbness.  HPI: Luis Poole is a [age] y.o. male with history of stroke with left-sided hemiparesis, diabetes mellitus type 2 on diet, hypertension had a fall 2 days ago.  Patient states he usually ambulates with the help of a walker.  He lost balance and fell onto his back.  Following which she did fine.  But last evening 24 hours after the fall patient started developing increasing pain in his low back which was radiating to his left lower extremity with numbness in both lower extremity.  This continued to worsen patient came to the ER.  ED Course: In the ER patient  noticed to have left-sided weakness from previous stroke.  Poor deep tendon reflexes.  CT of the head and neck were unremarkable.  X-ray of the lumbar spine and chest x-ray were unremarkable.  ER physician discussed with Dr. Otelia LimesLindzen who advised to get MRI of the brain and further observation.  Review of Systems: As per HPI, rest all negative.   Past Medical History:  Diagnosis Date  . Anemia, unspecified 06/08/2013  . Cataract   . Diabetes mellitus   . Hypertension   . S/P lumbar fusion   . Stroke [facility](HCC)     Past Surgical History:  Procedure Laterality Date  . CATARACT EXTRACTION    . EYE SURGERY    . KNEE SURGERY  2006  . NECK SURGERY  2012  . SPINE SURGERY  2009  . TONSILECTOMY, ADENOIDECTOMY, BILATERAL MYRINGOTOMY AND TUBES       reports that he has quit smoking. He has quit using smokeless tobacco. He reports that he does not drink alcohol or use drugs.  Allergies  Allergen Reactions  . Lisinopril Anaphylaxis  . Gabapentin Other (See Comments)    Dizziness    Family History  Problem Relation Age of Onset  . Stroke Mother     Prior to Admission medications   Medication Sig Start Date End Date Taking? Authorizing  Provider  acetaminophen (TYLENOL) 650 MG CR tablet Take 1 tablet (650 mg total) by mouth 2 (two) times daily. Patient taking differently: Take 1,300 mg by mouth every 8 (eight) hours as needed for pain.  02/22/17  Yes Nanavati, Ankit, MD  amLODipine (NORVASC) 10 MG tablet Take 10 mg by mouth daily.    Yes [provider]  aspirin buffered (BUFFERIN) 325 MG TABS tablet Take 325 mg by mouth daily.   Yes [provider]  doxazosin (CARDURA) 8 MG tablet Take 8 mg by mouth at bedtime.    Yes [provider]  multivitamin-lutein (OCUVITE-LUTEIN) CAPS capsule Take 1 capsule by mouth 2 (two) times daily.   Yes [provider]  pregabalin (LYRICA) 100 MG capsule Take 100 mg by mouth 2 (two) times daily.   Yes [provider]  terbinafine (LAMISIL) 250 MG tablet Take 250 mg by mouth daily. 05/14/17  Yes [provider]  triamterene-hydrochlorothiazide (MAXZIDE-25) 37.5-25 MG per tablet Take 1 tablet by mouth daily.    Yes [provider]  valsartan (DIOVAN) 160 MG tablet Take 160 mg daily by mouth.   Yes [provider]  oxyCODONE-acetaminophen (PERCOCET) 5-325 MG tablet Take 1 tablet by mouth every 4 (four) hours as needed for moderate pain. Patient not taking: Reported on 06/27/2017 05/26/17   Dione BoozeGlick, David,  MD  potassium chloride SA (K-DUR,KLOR-CON) 20 MEQ tablet Take 1 tablet (20 mEq total) by mouth 2 (two) times daily. 05/26/17   Dione Booze, MD    Physical Exam: Vitals:   06/28/17 0030 06/28/17 0035 06/28/17 0100 06/28/17 0130  BP: 129/79  (!) 134/95 (!) 145/83  Pulse:   76 76  Resp: 15  19 16   Temp:  98.2 F (36.8 C)    TempSrc:      SpO2:   99% 99%      Constitutional: Moderately built and nourished. Vitals:   06/28/17 0030 06/28/17 0035 06/28/17 0100 06/28/17 0130  BP: 129/79  (!) 134/95 (!) 145/83  Pulse:   76 76  Resp: 15  19 16   Temp:  98.2 F (36.8 C)    TempSrc:      SpO2:   99% 99%   Eyes: Anicteric no  pallor. ENMT: No discharge from the ears eyes nose or mouth. Neck: No mass felt.  No neck rigidity. Respiratory: No rhonchi or crepitations.  Cardiovascular: S1-S2 heard no murmurs appreciated. Abdomen: Soft nontender bowel sounds present. Musculoskeletal: No edema.  No joint effusion. Skin: No rash.  Skin appears warm. Neurologic: Alert awake oriented to time place and person.  Left-sided hemiparesis from previous stroke.  Poor deep tendon reflexes.  No facial asymmetry.  Tongue is midline. Psychiatric: Appears normal.  Normal affect.   Labs on Admission: I have personally reviewed following labs and imaging studies  CBC: Recent Labs  Lab 06/27/17 2143  WBC 6.8  NEUTROABS 3.9  HGB 9.8*  HCT 30.8*  MCV 85.1  PLT 214   Basic Metabolic Panel: Recent Labs  Lab 06/27/17 2143  NA 140  K 3.8  CL 103  CO2 28  GLUCOSE 125*  BUN 20  CREATININE 1.39*  CALCIUM 9.0   GFR: CrCl cannot be calculated (Unknown ideal weight.). Liver Function Tests: Recent Labs  Lab 06/27/17 2143  AST 15  ALT 11*  ALKPHOS 72  BILITOT 0.5  PROT 7.8  ALBUMIN 3.4*   No results for input(s): LIPASE, AMYLASE in the last 168 hours. No results for input(s): AMMONIA in the last 168 hours. Coagulation Profile: No results for input(s): INR, PROTIME in the last 168 hours. Cardiac Enzymes: Recent Labs  Lab 06/27/17 2143  TROPONINI <0.03   BNP (last 3 results) No results for input(s): PROBNP in the last 8760 hours. HbA1C: No results for input(s): HGBA1C in the last 72 hours. CBG: Recent Labs  Lab 06/27/17 1915  GLUCAP 150*   Lipid Profile: No results for input(s): CHOL, HDL, LDLCALC, TRIG, CHOLHDL, LDLDIRECT in the last 72 hours. Thyroid Function Tests: No results for input(s): TSH, T4TOTAL, FREET4, T3FREE, THYROIDAB in the last 72 hours. Anemia Panel: No results for input(s): VITAMINB12, FOLATE, FERRITIN, TIBC, IRON, RETICCTPCT in the last 72 hours. Urine analysis:    Component Value  Date/Time   COLORURINE YELLOW 06/27/2017 2143   APPEARANCEUR CLEAR 06/27/2017 2143   LABSPEC 1.016 06/27/2017 2143   PHURINE 6.0 06/27/2017 2143   GLUCOSEU NEGATIVE 06/27/2017 2143   HGBUR NEGATIVE 06/27/2017 2143   BILIRUBINUR NEGATIVE 06/27/2017 2143   KETONESUR NEGATIVE 06/27/2017 2143   PROTEINUR NEGATIVE 06/27/2017 2143   UROBILINOGEN 0.2 08/28/2012 1904   NITRITE NEGATIVE 06/27/2017 2143   LEUKOCYTESUR NEGATIVE 06/27/2017 2143   Sepsis Labs: @LABRCNTIP (procalcitonin:4,lacticidven:4) )No results found for this or any previous visit (from the past 240 hour(s)).   Radiological Exams on Admission: Dg Chest 2 View  Result Date: 06/27/2017  CLINICAL DATA:  Fall, weakness EXAM: CHEST  2 VIEW COMPARISON:  02/01/2016 FINDINGS: Stable mild kyphosis of the spine with minimal contiguous thoracic wedging. Postsurgical changes of the cervical spine. No acute consolidation or effusion. Normal cardiomediastinal silhouette. No pneumothorax. IMPRESSION: No active cardiopulmonary disease. Electronically Signed   By: Jasmine Pang M.D.   On: 06/27/2017 23:02   Dg Lumbar Spine Complete  Result Date: 06/27/2017 CLINICAL DATA:  Fall EXAM: LUMBAR SPINE - COMPLETE 4+ VIEW COMPARISON:  02/21/2017 FINDINGS: Metallic fragments over the right hip. Posterior stabilization rods and fixating screws at L5-S1 with interbody device. Moderate degenerative changes at L2-L3 with mild changes at L3-L4. Vertebral body heights are normal. IMPRESSION: Stable postsurgical changes at L5-S1.  No acute osseous abnormality Electronically Signed   By: Jasmine Pang M.D.   On: 06/27/2017 23:06   Ct Head Wo Contrast  Result Date: 06/27/2017 CLINICAL DATA:  Numerous false lately, twice last week and once this morning. Weakness in the left leg with lower extremity numbness. EXAM: CT HEAD WITHOUT CONTRAST CT CERVICAL SPINE WITHOUT CONTRAST TECHNIQUE: Multidetector CT imaging of the head and cervical spine was performed following the  standard protocol without intravenous contrast. Multiplanar CT image reconstructions of the cervical spine were also generated. COMPARISON:  Head CT 08/29/2012 FINDINGS: CT HEAD FINDINGS Brain: Chronic encephalomalacia near the vertex of the brain reflecting chronic posterior right ACA territory infarct. Chronic partially empty pituitary sella. Chronic small vessel ischemic disease of periventricular white matter. No large vascular territory infarct, hemorrhage or midline shift. No intra-axial mass nor extra-axial fluid collections. Vascular: No hyperdense vessel or unexpected calcification. Skull: No acute osseous abnormality. Sinuses/Orbits: Mild ethmoid sinus mucosal thickening. The remainder of the paranasal sinuses appear clear. Left lens replacement. Other: Chronic left mastoid effusion CT CERVICAL SPINE FINDINGS Alignment: Slight reversal of cervical lordosis secondary to anterior cervical disc fusion spanning C5 through C7 with posterior cerclage wires across the spinous processes. Intact atlantodental interval and craniocervical relationship. Skull base and vertebrae: Bony incorporation status post ACDF across the C5 through C7 interspaces. No acute vertebral body fracture. No significant listhesis. Soft tissues and spinal canal: No prevertebral soft tissue swelling. Disc levels: Mild disc space narrowing C2 through C5 and at C7-T1. No significant central or neural foraminal narrowing. No jumped or perched facets. Upper chest: Negative. Other: None IMPRESSION: 1. 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 2. Chronic small vessel ischemia. No acute intracranial abnormality. 3. Chronic left mastoid effusion. 4. ACDF with bony fusion across the C5 through C7 interspaces. No acute cervical spine fracture or posttraumatic subluxation. Electronically Signed   By: Tollie Eth M.D.   On: 06/27/2017 23:22   Ct Cervical Spine Wo Contrast  Result Date: 06/27/2017 CLINICAL DATA:  Numerous false lately,  twice last week and once this morning. Weakness in the left leg with lower extremity numbness. EXAM: CT HEAD WITHOUT CONTRAST CT CERVICAL SPINE WITHOUT CONTRAST TECHNIQUE: Multidetector CT imaging of the head and cervical spine was performed following the standard protocol without intravenous contrast. Multiplanar CT image reconstructions of the cervical spine were also generated. COMPARISON:  Head CT 08/29/2012 FINDINGS: CT HEAD FINDINGS Brain: Chronic encephalomalacia near the vertex of the brain reflecting chronic posterior right ACA territory infarct. Chronic partially empty pituitary sella. Chronic small vessel ischemic disease of periventricular white matter. No large vascular territory infarct, hemorrhage or midline shift. No intra-axial mass nor extra-axial fluid collections. Vascular: No hyperdense vessel or unexpected calcification. Skull: No acute osseous abnormality. Sinuses/Orbits: Mild ethmoid  sinus mucosal thickening. The remainder of the paranasal sinuses appear clear. Left lens replacement. Other: Chronic left mastoid effusion CT CERVICAL SPINE FINDINGS Alignment: Slight reversal of cervical lordosis secondary to anterior cervical disc fusion spanning C5 through C7 with posterior cerclage wires across the spinous processes. Intact atlantodental interval and craniocervical relationship. Skull base and vertebrae: Bony incorporation status post ACDF across the C5 through C7 interspaces. No acute vertebral body fracture. No significant listhesis. Soft tissues and spinal canal: No prevertebral soft tissue swelling. Disc levels: Mild disc space narrowing C2 through C5 and at C7-T1. No significant central or neural foraminal narrowing. No jumped or perched facets. Upper chest: Negative. Other: None IMPRESSION: 1. 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 2. Chronic small vessel ischemia. No acute intracranial abnormality. 3. Chronic left mastoid effusion. 4. ACDF with bony fusion across the  C5 through C7 interspaces. No acute cervical spine fracture or posttraumatic subluxation. Electronically Signed   By: Tollie Ethavid  Kwon M.D.   On: 06/27/2017 23:22    EKG: Independently reviewed.  Normal sinus rhythm.  Assessment/Plan Principal Problem:   Low back pain Active Problems:   History of CVA (cerebrovascular accident)   Diabetes mellitus type 2 in nonobese (HCC)   CKD (chronic kidney disease), stage III (HCC)   Chronic diastolic heart failure (HCC)    1. Low back pain with radiation to the left lower extremity with numbness of the both lower extremity -I have discussed with on-call neurologist Dr. Otelia LimesLindzen who advised to get MRI of the brain and MRI of the lumbar spine.  If MRI of the brain does show stroke then further stroke workup and neurology consult.  MRI of the LS spine to make sure there was no compression fractures or any disc protrusion.  Physical therapy consult. 2. History of stroke presently on aspirin. 3. History of diabetes mellitus type 2 on diet.  I have placed patient on sliding scale coverage. 4. Hypertension on amlodipine and ARB cardiogram. 5. Chronic kidney disease stage III -follow metabolic panel.  I have reviewed patient's old charts and labs. Dopplers of the lower extremity ordered since patient has found increasing edema in the left lower extremity.   DVT prophylaxis: SCDs until MRI results available. Code Status: Full code. Family Communication: Discussed with patient. Disposition Plan: Home. Consults called: None. Admission status: Observation.   Eduard ClosKAKRAKANDY,Paloma Grange N. MD [facility] Pager [phone].  If 7PM-7AM, please contact night-coverage [email or web address] Password [email or web address]  06/28/2017, 2:57 AM

## 2017-06-29 ENCOUNTER — Observation Stay (HOSPITAL_COMMUNITY): Payer: Medicare Other

## 2017-06-29 DIAGNOSIS — G8929 Other chronic pain: Secondary | ICD-10-CM | POA: Diagnosis not present

## 2017-06-29 DIAGNOSIS — M19071 Primary osteoarthritis, right ankle and foot: Secondary | ICD-10-CM | POA: Diagnosis not present

## 2017-06-29 DIAGNOSIS — M5440 Lumbago with sciatica, unspecified side: Secondary | ICD-10-CM | POA: Diagnosis not present

## 2017-06-29 DIAGNOSIS — E119 Type 2 diabetes mellitus without complications: Secondary | ICD-10-CM

## 2017-06-29 DIAGNOSIS — M109 Gout, unspecified: Secondary | ICD-10-CM

## 2017-06-29 DIAGNOSIS — M19072 Primary osteoarthritis, left ankle and foot: Secondary | ICD-10-CM | POA: Diagnosis not present

## 2017-06-29 LAB — URINE CULTURE: Culture: <10000 — AB

## 2017-06-29 LAB — GLUCOSE, CAPILLARY
GLUCOSE-CAPILLARY: 90 mg/dL (ref 65–99)
Glucose-Capillary: 120 mg/dL — ABNORMAL HIGH (ref 65–99)

## 2017-06-29 LAB — URIC ACID: URIC ACID, SERUM: 9 mg/dL — AB (ref 4.4–7.6)

## 2017-06-29 MED ORDER — PREDNISONE 50 MG PO TABS: 50.0000 mg | ORAL_TABLET | Freq: Every day | ORAL | 0 refills | Status: AC

## 2017-06-29 MED ORDER — METHYLPREDNISOLONE SODIUM SUCC 125 MG IJ SOLR
125.0000 mg | Freq: Once | INTRAMUSCULAR | Status: AC
  Administered 2017-06-29: 125 mg via INTRAVENOUS
  Filled 2017-06-29: qty 2

## 2017-06-29 MED ORDER — METHOCARBAMOL 500 MG PO TABS
500.0000 mg | ORAL_TABLET | Freq: Three times a day (TID) | ORAL | Status: DC
  Administered 2017-06-29: 500 mg via ORAL
  Filled 2017-06-29: qty 1

## 2017-06-29 MED ORDER — COLCHICINE 0.6 MG PO TABS: 0.6000 mg | ORAL_TABLET | Freq: Every day | ORAL | 0 refills | Status: AC

## 2017-06-29 NOTE — Evaluation (Signed)
Physical Therapy Evaluation Patient Details Name: Luis OdeaRobert P Poole MRN: [id number] DOB: 01/23/1941 Today's Date: 06/29/2017   History of Present Illness  Pt admitted 2* with difficulty walking and bilat foot pain.  Pt with hx of CHF, CKD, lumbar fusion, peripheral neuropathy and CVA with residual L side weakness  Clinical Impression  Pt admitted as above and presenting with functional mobility limitations 2* L side residual deficits associated with prior CVA, foot pain increased with WB and balance deficits.  Pt plans dc home with 24/7 assist of family and would benefit from follow up HHPT.    Follow Up Recommendations Home health PT    Equipment Recommendations  None recommended by PT    Recommendations for Other Services OT consult     Precautions / Restrictions Precautions Precautions: Fall Required Braces or Orthoses: Other Brace/Splint Other Brace/Splint: AFO on L Restrictions Weight Bearing Restrictions: No      Mobility  Bed Mobility Overal bed mobility: Needs Assistance Bed Mobility: Supine to Sit     Supine to sit: Min assist     General bed mobility comments: With increased time pt able to bring legs over EOB and sit up but requiring min assist with pad to complete transition to EOB sitting  Transfers Overall transfer level: Needs assistance Equipment used: Rolling walker (2 wheeled) Transfers: Sit to/from Stand Sit to Stand: Min guard         General transfer comment: Min guard to steady during transition  Ambulation/Gait Ambulation/Gait assistance: Min assist;Min guard Ambulation Distance (Feet): 36 Feet Assistive device: Rolling walker (2 wheeled) Gait Pattern/deviations: Step-to pattern;Step-through pattern;Decreased step length - right;Decreased step length - left;Shuffle;Trunk flexed;Wide base of support Gait velocity: decr Gait velocity interpretation: Below normal speed for age/gender General Gait Details: Difficulty advancing L LE.  Cues for  posture and position from RW.  Several standing rest breaks 2* fatigue and foot discomfort  Stairs            Wheelchair Mobility    Modified Rankin (Stroke Patients Only)       Balance Overall balance assessment: Needs assistance Sitting-balance support: No upper extremity supported;Feet supported Sitting balance-Leahy Scale: Good     Standing balance support: Bilateral upper extremity supported Standing balance-Leahy Scale: Poor                               Pertinent Vitals/Pain Pain Assessment: 0-10 Pain Score: 5  Pain Location: L toes Pain Descriptors / Indicators: Aching;Sore;Burning Pain Intervention(s): Limited activity within patient's tolerance;Monitored during session    Home Living Family/patient expects to be discharged to:: Private residence Living Arrangements: Children;Other relatives Available Help at Discharge: Family;Available 24 hours/day Type of Home: House Home Access: Ramped entrance     Home Layout: One level Home Equipment: Bedside commode;Walker - 4 wheels;Wheelchair - manual      Prior Function Level of Independence: Independent with assistive device(s)         Comments: ambulates in home with walker but uses WC for distance     Hand Dominance        Extremity/Trunk Assessment   Upper Extremity Assessment Upper Extremity Assessment: LUE deficits/detail LUE Deficits / Details: Residual affects of previous CVA but pt able to use hand to manipulate AFO strap    Lower Extremity Assessment Lower Extremity Assessment: LLE deficits/detail LLE Deficits / Details: Decreased strength 2* previous CVA including L foot drop LLE Coordination: decreased fine motor;decreased gross motor  Communication   Communication: No difficulties  Cognition Arousal/Alertness: Awake/alert Behavior During Therapy: WFL for tasks assessed/performed Overall Cognitive Status: Within Functional Limits for tasks assessed                                         General Comments      Exercises     Assessment/Plan    PT Assessment Patient needs continued PT services  PT Problem List Decreased strength;Decreased activity tolerance;Decreased balance;Decreased mobility;Decreased knowledge of use of DME;Pain;Obesity       PT Treatment Interventions DME instruction;Gait training;Functional mobility training;Therapeutic activities;Therapeutic exercise;Balance training;Patient/family education    PT Goals (Current goals can be found in the Care Plan section)  Acute Rehab PT Goals Patient Stated Goal: walk with less pain PT Goal Formulation: With patient Time For Goal Achievement: 07/12/17 Potential to Achieve Goals: Fair    Frequency Min 3X/week   Barriers to discharge        Co-evaluation               AM-PAC PT "6 Clicks" Daily Activity  Outcome Measure Difficulty turning over in bed (including adjusting bedclothes, sheets and blankets)?: A Little Difficulty moving from lying on back to sitting on the side of the bed? : A Lot Difficulty sitting down on and standing up from a chair with arms (e.g., wheelchair, bedside commode, etc,.)?: A Lot Help needed moving to and from a bed to chair (including a wheelchair)?: A Little Help needed walking in hospital room?: A Little Help needed climbing 3-5 steps with a railing? : A Lot 6 Click Score: 15    End of Session Equipment Utilized During Treatment: Gait belt Activity Tolerance: Patient tolerated treatment well;Patient limited by pain Patient left: in chair;with call bell/phone within reach;with family/visitor present Nurse Communication: Mobility status PT Visit Diagnosis: Unsteadiness on feet (R26.81);History of falling (Z91.81);Difficulty in walking, not elsewhere classified (R26.2);Pain Pain - Right/Left: Left Pain - part of body: Ankle and joints of foot    Time: 1205-1232 PT Time Calculation (min) (ACUTE ONLY): 27  min   Charges:   PT Evaluation $PT Eval Low Complexity: 1 Low PT Treatments $Gait Training: 8-22 mins   PT G Codes:   PT G-Codes **NOT FOR INPATIENT CLASS** Functional Assessment Tool Used: AM-PAC 6 Clicks Basic Mobility Functional Limitation: Mobility: Walking and moving around Mobility: Walking and Moving Around Current Status ([id number](G8978): At least 40 percent but less than 60 percent impaired, limited or restricted Mobility: Walking and Moving Around Goal Status [phone](G8979): At least 20 percent but less than 40 percent impaired, limited or restricted    Pg [phone]   Silvanna Ohmer 06/29/2017, 1:32 PM

## 2017-06-29 NOTE — Progress Notes (Signed)
This shift pt questioning where his glasses are. States he had them on prior to procedure, MRI?[facility] Kitchen. States when he came back upstairs they were on his face. This RN reviewed chart to confirm glasses here at admission on 11/9. Looked over room, bed,closet and called to MRI, with no answer. Secretary and NT went to radiology but no answer. Advised pt we will f/u with dayshift staff regarding whereabouts.

## 2017-06-29 NOTE — Progress Notes (Signed)
pts glasses have been located

## 2017-06-29 NOTE — Discharge Summary (Signed)
Physician Discharge Summary  Luis OdeaRobert P Poole ZOX:[id number] DOB: 06/04/1941 DOA: 06/27/2017  PCP: Luis RakersBland, Veita, MD  Admit date: 06/27/2017 Discharge date: 06/29/2017  Admitted From:home Disposition:home  Recommendations for Outpatient Follow-up:  1. Follow up with PCP in 1-2 weeks 2. Please obtain BMP/CBC in one week  Home Health:yes Equipment/Devices:has wheel chair Discharge Condition:stable CODE STATUS:full code Diet recommendation:heart healthy  Brief/Interim Summary: 76 year old gentleman with history of a stroke with left-sided hemiparesis, diabetes type 2, hypertension presented with a chronic back pain, lower extremities numbness and bilateral foot pain.  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  Patient reported bilateral foot pain especially metatarsal joint on both big toes.  X-ray with osteoarthritis changes and elevated uric acid consistent with acute gout flare.  Treated with a dose of IV Solu-Medrol and is started on short course of oral prednisone for 3 days and started on colchicine.  Recommend to follow-up with PCP.  Patient likely needs to be on allopurinol when acute flare resolves.  Home care services including physical therapy, OT home health aide and nursing ordered.  Patient is clinically stable and can be managed outpatient.  Denies headache, dizziness, nausea vomiting chest pain shortness of breath.  He was observed by a neurologist in the hospital.  Discharge Diagnoses:  Principal Problem:   Low back pain Active Problems:   History of CVA (cerebrovascular accident)   Diabetes mellitus type 2 in nonobese (HCC)   CKD (chronic kidney disease), stage III (HCC)   Chronic diastolic heart failure [facility](HCC)    Discharge Instructions  Discharge Instructions    Call MD for:  difficulty breathing, headache or visual disturbances   Complete by:  As directed    Call MD for:  extreme fatigue   Complete by:  As directed    Call MD  for:  hives   Complete by:  As directed    Call MD for:  persistant dizziness or light-headedness   Complete by:  As directed    Call MD for:  persistant nausea and vomiting   Complete by:  As directed    Call MD for:  severe uncontrolled pain   Complete by:  As directed    Call MD for:  temperature >100.4   Complete by:  As directed    Diet - low sodium heart healthy   Complete by:  As directed    Increase activity slowly   Complete by:  As directed      Allergies as of 06/29/2017      Reactions   Lisinopril Anaphylaxis   Gabapentin Other (See Comments)   Dizziness      Medication List    STOP taking these medications   oxyCODONE-acetaminophen 5-325 MG tablet Commonly known as:  PERCOCET   triamterene-hydrochlorothiazide 37.5-25 MG tablet Commonly known as:  MAXZIDE-25     TAKE these medications   acetaminophen 650 MG CR tablet Commonly known as:  TYLENOL Take 1 tablet (650 mg total) by mouth 2 (two) times daily. What changed:    how much to take  when to take this  reasons to take this   amLODipine 10 MG tablet Commonly known as:  NORVASC Take 10 mg by mouth daily.   BUFFERIN 325 MG Tabs tablet Generic drug:  aspirin buffered Take 325 mg by mouth daily.   colchicine 0.6 MG tablet Take 1 tablet (0.6 mg total) daily by mouth.   doxazosin 8 MG tablet Commonly known as:  CARDURA  Take 8 mg by mouth at bedtime.   multivitamin-lutein Caps capsule Take 1 capsule by mouth 2 (two) times daily.   potassium chloride SA 20 MEQ tablet Commonly known as:  K-DUR,KLOR-CON Take 1 tablet (20 mEq total) by mouth 2 (two) times daily.   predniSONE 50 MG tablet Commonly known as:  DELTASONE Take 1 tablet (50 mg total) daily with breakfast for 3 days by mouth.   pregabalin 100 MG capsule Commonly known as:  LYRICA Take 100 mg by mouth 2 (two) times daily.   terbinafine 250 MG tablet Commonly known as:  LAMISIL Take 250 mg by mouth daily.   valsartan 160 MG  tablet Commonly known as:  DIOVAN Take 160 mg daily by mouth.      Follow-up Information    Luis RakersBland, Veita, MD. Schedule an appointment as soon as possible for a visit in 1 week(s).   Specialty:  Family Medicine Contact information: [address] [place] [address] [phone]          Allergies  Allergen Reactions  . Lisinopril Anaphylaxis  . Gabapentin Other (See Comments)    Dizziness    Consultations: Neurology  Procedures/Studies: MRI  Subjective: Seen and examined at bedside.  Denies headache, dizziness, nausea vomiting chest pain shortness of breath.  Has  bilateral foot pain.  Patient is wheelchair-bound.  Discharge Exam: Vitals:   06/28/17 2219 06/29/17 0448  BP: 109/68 (!) 137/94  Pulse: 76 80  Resp: 18 18  Temp: 97.6 F (36.4 C) 97.7 F (36.5 C)  SpO2: 100% 100%   Vitals:   06/28/17 0951 06/28/17 1418 06/28/17 2219 06/29/17 0448  BP: 122/73 124/63 109/68 (!) 137/94  Pulse: 77 70 76 80  Resp: 18 18 18 18   Temp: 98.7 F (37.1 C) 98.3 F (36.8 C) 97.6 F (36.4 C) 97.7 F (36.5 C)  TempSrc: Oral Oral Oral Oral  SpO2: 100% 100% 100% 100%  Weight:      Height:        General: Pt is alert, awake, not in acute distress Cardiovascular: RRR, S1/S2 +, no rubs, no gallops Respiratory: CTA bilaterally, no wheezing, no rhonchi Abdominal: Soft, NT, ND, bowel sounds + Extremities: no edema, no cyanosis Bilateral great toes metatarsal joint mild tenderness, no swelling.   The results of significant diagnostics from this hospitalization (including imaging, microbiology, ancillary and laboratory) are listed below for reference.     Microbiology: Recent Results (from the past 240 hour(s))  Urine culture     Status: Abnormal   Collection Time: 06/27/17  9:46 PM  Result Value Ref Range Status   Specimen Description URINE, RANDOM  Final   Special Requests NONE  Final   Culture (A)  Final    <10,000 COLONIES/mL INSIGNIFICANT GROWTH Performed  at [facility], [address] [place], [place] [address]    Report Status 06/29/2017 FINAL  Final     Labs: BNP (last 3 results) No results for input(s): BNP in the last 8760 hours. Basic Metabolic Panel: Recent Labs  Lab 06/27/17 2143 06/28/17 0630  NA 140 137  K 3.8 3.8  CL 103 102  CO2 28 26  GLUCOSE 125* 113*  BUN 20 20  CREATININE 1.39* 1.24  CALCIUM 9.0 8.8*   Liver Function Tests: Recent Labs  Lab 06/27/17 2143  AST 15  ALT 11*  ALKPHOS 72  BILITOT 0.5  PROT 7.8  ALBUMIN 3.4*   No results for input(s): LIPASE, AMYLASE in the  last 168 hours. No results for input(s): AMMONIA in the last 168 hours. CBC: Recent Labs  Lab 06/27/17 2143 06/28/17 0630  WBC 6.8 6.3  NEUTROABS 3.9  --   HGB 9.8* 9.6*  HCT 30.8* 30.0*  MCV 85.1 85.0  PLT 214 204   Cardiac Enzymes: Recent Labs  Lab 06/27/17 2143  TROPONINI <0.03   BNP: Invalid input(s): POCBNP CBG: Recent Labs  Lab 06/28/17 0747 06/28/17 1715 06/28/17 2217 06/29/17 0737 06/29/17 1138  GLUCAP 101* 98 150* 90 120*   D-Dimer No results for input(s): DDIMER in the last 72 hours. Hgb A1c No results for input(s): HGBA1C in the last 72 hours. Lipid Profile No results for input(s): CHOL, HDL, LDLCALC, TRIG, CHOLHDL, LDLDIRECT in the last 72 hours. Thyroid function studies No results for input(s): TSH, T4TOTAL, T3FREE, THYROIDAB in the last 72 hours.  Invalid input(s): FREET3 Anemia work up No results for input(s): VITAMINB12, FOLATE, FERRITIN, TIBC, IRON, RETICCTPCT in the last 72 hours. Urinalysis    Component Value Date/Time   COLORURINE YELLOW 06/27/2017 2143   APPEARANCEUR CLEAR 06/27/2017 2143   LABSPEC 1.016 06/27/2017 2143   PHURINE 6.0 06/27/2017 2143   GLUCOSEU NEGATIVE 06/27/2017 2143   HGBUR NEGATIVE 06/27/2017 2143   BILIRUBINUR NEGATIVE 06/27/2017 2143   KETONESUR NEGATIVE 06/27/2017 2143   PROTEINUR NEGATIVE 06/27/2017 2143   UROBILINOGEN 0.2 08/28/2012 1904    NITRITE NEGATIVE 06/27/2017 2143   LEUKOCYTESUR NEGATIVE 06/27/2017 2143   Sepsis Labs Invalid input(s): PROCALCITONIN,  WBC,  LACTICIDVEN Microbiology Recent Results (from the past 240 hour(s))  Urine culture     Status: Abnormal   Collection Time: 06/27/17  9:46 PM  Result Value Ref Range Status   Specimen Description URINE, RANDOM  Final   Special Requests NONE  Final   Culture (A)  Final    <10,000 COLONIES/mL INSIGNIFICANT GROWTH Performed at [facility], [address] [place], [place] [address]    Report Status 06/29/2017 FINAL  Final     Time coordinating discharge: 30 minutes  SIGNED:   Maxie Barb, MD  [facility] 06/29/2017, 12:26 PM  If 7PM-7AM, please contact night-coverage [email or web address] Password [email or web address]

## 2017-06-29 NOTE — Progress Notes (Signed)
Discharge instructions and medications discussed with patient.  Prescriptions and AVS given to patient. All questions answered.  

## 2017-06-29 NOTE — Care Management Obs Status (Signed)
MEDICARE OBSERVATION STATUS NOTIFICATION   Patient Details  Name: Luis Poole MRN: [id number] Date of Birth: 12/08/1940   Medicare Observation Status Notification Given:  Yes    Elliot CousinShavis, Sumiye Hirth Ellen, RN 06/29/2017, 10:00 AM
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
## 2017-06-30 LAB — GLUCOSE, CAPILLARY: Glucose-Capillary: 103 mg/dL — ABNORMAL HIGH (ref 65–99)

## 2017-07-02 DIAGNOSIS — M1000 Idiopathic gout, unspecified site: Secondary | ICD-10-CM | POA: Diagnosis not present

## 2017-07-02 DIAGNOSIS — E118 Type 2 diabetes mellitus with unspecified complications: Secondary | ICD-10-CM | POA: Diagnosis not present

## 2017-07-02 DIAGNOSIS — N189 Chronic kidney disease, unspecified: Secondary | ICD-10-CM | POA: Diagnosis not present

## 2017-07-07 DIAGNOSIS — M545 Low back pain: Secondary | ICD-10-CM | POA: Diagnosis not present

## 2017-07-07 DIAGNOSIS — Z7982 Long term (current) use of aspirin: Secondary | ICD-10-CM | POA: Diagnosis not present

## 2017-07-07 DIAGNOSIS — I130 Hypertensive heart and chronic kidney disease with heart failure and stage 1 through stage 4 chronic kidney disease, or unspecified chronic kidney disease: Secondary | ICD-10-CM | POA: Diagnosis not present

## 2017-07-07 DIAGNOSIS — E1122 Type 2 diabetes mellitus with diabetic chronic kidney disease: Secondary | ICD-10-CM | POA: Diagnosis not present

## 2017-07-07 DIAGNOSIS — I69354 Hemiplegia and hemiparesis following cerebral infarction affecting left non-dominant side: Secondary | ICD-10-CM | POA: Diagnosis not present

## 2017-07-07 DIAGNOSIS — Z981 Arthrodesis status: Secondary | ICD-10-CM | POA: Diagnosis not present

## 2017-07-07 DIAGNOSIS — R202 Paresthesia of skin: Secondary | ICD-10-CM | POA: Diagnosis not present

## 2017-07-07 DIAGNOSIS — Z9181 History of falling: Secondary | ICD-10-CM | POA: Diagnosis not present

## 2017-07-07 DIAGNOSIS — N183 Chronic kidney disease, stage 3 (moderate): Secondary | ICD-10-CM | POA: Diagnosis not present

## 2017-07-07 DIAGNOSIS — I5032 Chronic diastolic (congestive) heart failure: Secondary | ICD-10-CM | POA: Diagnosis not present

## 2017-07-07 DIAGNOSIS — Z87891 Personal history of nicotine dependence: Secondary | ICD-10-CM | POA: Diagnosis not present

## 2017-07-09 DIAGNOSIS — I130 Hypertensive heart and chronic kidney disease with heart failure and stage 1 through stage 4 chronic kidney disease, or unspecified chronic kidney disease: Secondary | ICD-10-CM | POA: Diagnosis not present

## 2017-07-09 DIAGNOSIS — Z981 Arthrodesis status: Secondary | ICD-10-CM | POA: Diagnosis not present

## 2017-07-09 DIAGNOSIS — Z87891 Personal history of nicotine dependence: Secondary | ICD-10-CM | POA: Diagnosis not present

## 2017-07-09 DIAGNOSIS — Z7982 Long term (current) use of aspirin: Secondary | ICD-10-CM | POA: Diagnosis not present

## 2017-07-09 DIAGNOSIS — M545 Low back pain: Secondary | ICD-10-CM | POA: Diagnosis not present

## 2017-07-09 DIAGNOSIS — N183 Chronic kidney disease, stage 3 (moderate): Secondary | ICD-10-CM | POA: Diagnosis not present

## 2017-07-09 DIAGNOSIS — E1122 Type 2 diabetes mellitus with diabetic chronic kidney disease: Secondary | ICD-10-CM | POA: Diagnosis not present

## 2017-07-09 DIAGNOSIS — I5032 Chronic diastolic (congestive) heart failure: Secondary | ICD-10-CM | POA: Diagnosis not present

## 2017-07-09 DIAGNOSIS — R202 Paresthesia of skin: Secondary | ICD-10-CM | POA: Diagnosis not present

## 2017-07-09 DIAGNOSIS — I69354 Hemiplegia and hemiparesis following cerebral infarction affecting left non-dominant side: Secondary | ICD-10-CM | POA: Diagnosis not present

## 2017-07-09 DIAGNOSIS — Z9181 History of falling: Secondary | ICD-10-CM | POA: Diagnosis not present

## 2017-07-14 DIAGNOSIS — Z9181 History of falling: Secondary | ICD-10-CM | POA: Diagnosis not present

## 2017-07-14 DIAGNOSIS — I69354 Hemiplegia and hemiparesis following cerebral infarction affecting left non-dominant side: Secondary | ICD-10-CM | POA: Diagnosis not present

## 2017-07-14 DIAGNOSIS — Z981 Arthrodesis status: Secondary | ICD-10-CM | POA: Diagnosis not present

## 2017-07-14 DIAGNOSIS — M545 Low back pain: Secondary | ICD-10-CM | POA: Diagnosis not present

## 2017-07-14 DIAGNOSIS — I5032 Chronic diastolic (congestive) heart failure: Secondary | ICD-10-CM | POA: Diagnosis not present

## 2017-07-14 DIAGNOSIS — I130 Hypertensive heart and chronic kidney disease with heart failure and stage 1 through stage 4 chronic kidney disease, or unspecified chronic kidney disease: Secondary | ICD-10-CM | POA: Diagnosis not present

## 2017-07-14 DIAGNOSIS — N183 Chronic kidney disease, stage 3 (moderate): Secondary | ICD-10-CM | POA: Diagnosis not present

## 2017-07-14 DIAGNOSIS — E1122 Type 2 diabetes mellitus with diabetic chronic kidney disease: Secondary | ICD-10-CM | POA: Diagnosis not present

## 2017-07-14 DIAGNOSIS — R202 Paresthesia of skin: Secondary | ICD-10-CM | POA: Diagnosis not present

## 2017-07-14 DIAGNOSIS — Z87891 Personal history of nicotine dependence: Secondary | ICD-10-CM | POA: Diagnosis not present

## 2017-07-14 DIAGNOSIS — Z7982 Long term (current) use of aspirin: Secondary | ICD-10-CM | POA: Diagnosis not present

## 2017-07-16 DIAGNOSIS — R202 Paresthesia of skin: Secondary | ICD-10-CM | POA: Diagnosis not present

## 2017-07-16 DIAGNOSIS — Z7982 Long term (current) use of aspirin: Secondary | ICD-10-CM | POA: Diagnosis not present

## 2017-07-16 DIAGNOSIS — Z9181 History of falling: Secondary | ICD-10-CM | POA: Diagnosis not present

## 2017-07-16 DIAGNOSIS — M545 Low back pain: Secondary | ICD-10-CM | POA: Diagnosis not present

## 2017-07-16 DIAGNOSIS — I69354 Hemiplegia and hemiparesis following cerebral infarction affecting left non-dominant side: Secondary | ICD-10-CM | POA: Diagnosis not present

## 2017-07-16 DIAGNOSIS — N183 Chronic kidney disease, stage 3 (moderate): Secondary | ICD-10-CM | POA: Diagnosis not present

## 2017-07-16 DIAGNOSIS — E1122 Type 2 diabetes mellitus with diabetic chronic kidney disease: Secondary | ICD-10-CM | POA: Diagnosis not present

## 2017-07-16 DIAGNOSIS — Z87891 Personal history of nicotine dependence: Secondary | ICD-10-CM | POA: Diagnosis not present

## 2017-07-16 DIAGNOSIS — I5032 Chronic diastolic (congestive) heart failure: Secondary | ICD-10-CM | POA: Diagnosis not present

## 2017-07-16 DIAGNOSIS — Z981 Arthrodesis status: Secondary | ICD-10-CM | POA: Diagnosis not present

## 2017-07-16 DIAGNOSIS — I130 Hypertensive heart and chronic kidney disease with heart failure and stage 1 through stage 4 chronic kidney disease, or unspecified chronic kidney disease: Secondary | ICD-10-CM | POA: Diagnosis not present

## 2017-07-22 DIAGNOSIS — M545 Low back pain: Secondary | ICD-10-CM | POA: Diagnosis not present

## 2017-07-22 DIAGNOSIS — I5032 Chronic diastolic (congestive) heart failure: Secondary | ICD-10-CM | POA: Diagnosis not present

## 2017-07-22 DIAGNOSIS — E1122 Type 2 diabetes mellitus with diabetic chronic kidney disease: Secondary | ICD-10-CM | POA: Diagnosis not present

## 2017-07-22 DIAGNOSIS — R202 Paresthesia of skin: Secondary | ICD-10-CM | POA: Diagnosis not present

## 2017-07-22 DIAGNOSIS — I69354 Hemiplegia and hemiparesis following cerebral infarction affecting left non-dominant side: Secondary | ICD-10-CM | POA: Diagnosis not present

## 2017-07-22 DIAGNOSIS — Z9181 History of falling: Secondary | ICD-10-CM | POA: Diagnosis not present

## 2017-07-22 DIAGNOSIS — I130 Hypertensive heart and chronic kidney disease with heart failure and stage 1 through stage 4 chronic kidney disease, or unspecified chronic kidney disease: Secondary | ICD-10-CM | POA: Diagnosis not present

## 2017-07-22 DIAGNOSIS — N183 Chronic kidney disease, stage 3 (moderate): Secondary | ICD-10-CM | POA: Diagnosis not present

## 2017-07-22 DIAGNOSIS — Z87891 Personal history of nicotine dependence: Secondary | ICD-10-CM | POA: Diagnosis not present

## 2017-07-22 DIAGNOSIS — Z7982 Long term (current) use of aspirin: Secondary | ICD-10-CM | POA: Diagnosis not present

## 2017-07-22 DIAGNOSIS — Z981 Arthrodesis status: Secondary | ICD-10-CM | POA: Diagnosis not present

## 2017-07-23 DIAGNOSIS — Z9181 History of falling: Secondary | ICD-10-CM | POA: Diagnosis not present

## 2017-07-23 DIAGNOSIS — Z7982 Long term (current) use of aspirin: Secondary | ICD-10-CM | POA: Diagnosis not present

## 2017-07-23 DIAGNOSIS — I69354 Hemiplegia and hemiparesis following cerebral infarction affecting left non-dominant side: Secondary | ICD-10-CM | POA: Diagnosis not present

## 2017-07-23 DIAGNOSIS — Z87891 Personal history of nicotine dependence: Secondary | ICD-10-CM | POA: Diagnosis not present

## 2017-07-23 DIAGNOSIS — R202 Paresthesia of skin: Secondary | ICD-10-CM | POA: Diagnosis not present

## 2017-07-23 DIAGNOSIS — M545 Low back pain: Secondary | ICD-10-CM | POA: Diagnosis not present

## 2017-07-23 DIAGNOSIS — Z981 Arthrodesis status: Secondary | ICD-10-CM | POA: Diagnosis not present

## 2017-07-23 DIAGNOSIS — N183 Chronic kidney disease, stage 3 (moderate): Secondary | ICD-10-CM | POA: Diagnosis not present

## 2017-07-23 DIAGNOSIS — E1122 Type 2 diabetes mellitus with diabetic chronic kidney disease: Secondary | ICD-10-CM | POA: Diagnosis not present

## 2017-07-23 DIAGNOSIS — I130 Hypertensive heart and chronic kidney disease with heart failure and stage 1 through stage 4 chronic kidney disease, or unspecified chronic kidney disease: Secondary | ICD-10-CM | POA: Diagnosis not present

## 2017-07-23 DIAGNOSIS — I5032 Chronic diastolic (congestive) heart failure: Secondary | ICD-10-CM | POA: Diagnosis not present

## 2017-07-25 DIAGNOSIS — M545 Low back pain: Secondary | ICD-10-CM | POA: Diagnosis not present

## 2017-07-25 DIAGNOSIS — Z9181 History of falling: Secondary | ICD-10-CM | POA: Diagnosis not present

## 2017-07-25 DIAGNOSIS — I130 Hypertensive heart and chronic kidney disease with heart failure and stage 1 through stage 4 chronic kidney disease, or unspecified chronic kidney disease: Secondary | ICD-10-CM | POA: Diagnosis not present

## 2017-07-25 DIAGNOSIS — N183 Chronic kidney disease, stage 3 (moderate): Secondary | ICD-10-CM | POA: Diagnosis not present

## 2017-07-25 DIAGNOSIS — I5032 Chronic diastolic (congestive) heart failure: Secondary | ICD-10-CM | POA: Diagnosis not present

## 2017-07-25 DIAGNOSIS — Z981 Arthrodesis status: Secondary | ICD-10-CM | POA: Diagnosis not present

## 2017-07-25 DIAGNOSIS — R202 Paresthesia of skin: Secondary | ICD-10-CM | POA: Diagnosis not present

## 2017-07-25 DIAGNOSIS — E1122 Type 2 diabetes mellitus with diabetic chronic kidney disease: Secondary | ICD-10-CM | POA: Diagnosis not present

## 2017-07-25 DIAGNOSIS — Z87891 Personal history of nicotine dependence: Secondary | ICD-10-CM | POA: Diagnosis not present

## 2017-07-25 DIAGNOSIS — Z7982 Long term (current) use of aspirin: Secondary | ICD-10-CM | POA: Diagnosis not present

## 2017-07-25 DIAGNOSIS — I69354 Hemiplegia and hemiparesis following cerebral infarction affecting left non-dominant side: Secondary | ICD-10-CM | POA: Diagnosis not present

## 2017-07-29 DIAGNOSIS — S63501A Unspecified sprain of right wrist, initial encounter: Secondary | ICD-10-CM | POA: Diagnosis not present

## 2017-07-29 DIAGNOSIS — S63034A Dislocation of midcarpal joint of right wrist, initial encounter: Secondary | ICD-10-CM | POA: Diagnosis not present

## 2017-07-30 DIAGNOSIS — Z981 Arthrodesis status: Secondary | ICD-10-CM | POA: Diagnosis not present

## 2017-07-30 DIAGNOSIS — Z87891 Personal history of nicotine dependence: Secondary | ICD-10-CM | POA: Diagnosis not present

## 2017-07-30 DIAGNOSIS — E1122 Type 2 diabetes mellitus with diabetic chronic kidney disease: Secondary | ICD-10-CM | POA: Diagnosis not present

## 2017-07-30 DIAGNOSIS — Z9181 History of falling: Secondary | ICD-10-CM | POA: Diagnosis not present

## 2017-07-30 DIAGNOSIS — I5032 Chronic diastolic (congestive) heart failure: Secondary | ICD-10-CM | POA: Diagnosis not present

## 2017-07-30 DIAGNOSIS — I69354 Hemiplegia and hemiparesis following cerebral infarction affecting left non-dominant side: Secondary | ICD-10-CM | POA: Diagnosis not present

## 2017-07-30 DIAGNOSIS — N183 Chronic kidney disease, stage 3 (moderate): Secondary | ICD-10-CM | POA: Diagnosis not present

## 2017-07-30 DIAGNOSIS — M545 Low back pain: Secondary | ICD-10-CM | POA: Diagnosis not present

## 2017-07-30 DIAGNOSIS — R202 Paresthesia of skin: Secondary | ICD-10-CM | POA: Diagnosis not present

## 2017-07-30 DIAGNOSIS — I130 Hypertensive heart and chronic kidney disease with heart failure and stage 1 through stage 4 chronic kidney disease, or unspecified chronic kidney disease: Secondary | ICD-10-CM | POA: Diagnosis not present

## 2017-07-30 DIAGNOSIS — Z7982 Long term (current) use of aspirin: Secondary | ICD-10-CM | POA: Diagnosis not present

## 2017-08-01 DIAGNOSIS — M25532 Pain in left wrist: Secondary | ICD-10-CM | POA: Diagnosis not present

## 2017-08-01 DIAGNOSIS — M19031 Primary osteoarthritis, right wrist: Secondary | ICD-10-CM | POA: Diagnosis not present

## 2017-08-05 DIAGNOSIS — E1122 Type 2 diabetes mellitus with diabetic chronic kidney disease: Secondary | ICD-10-CM | POA: Diagnosis not present

## 2017-08-05 DIAGNOSIS — I69354 Hemiplegia and hemiparesis following cerebral infarction affecting left non-dominant side: Secondary | ICD-10-CM | POA: Diagnosis not present

## 2017-08-05 DIAGNOSIS — N183 Chronic kidney disease, stage 3 (moderate): Secondary | ICD-10-CM | POA: Diagnosis not present

## 2017-08-05 DIAGNOSIS — I130 Hypertensive heart and chronic kidney disease with heart failure and stage 1 through stage 4 chronic kidney disease, or unspecified chronic kidney disease: Secondary | ICD-10-CM | POA: Diagnosis not present

## 2017-08-05 DIAGNOSIS — Z7982 Long term (current) use of aspirin: Secondary | ICD-10-CM | POA: Diagnosis not present

## 2017-08-05 DIAGNOSIS — Z981 Arthrodesis status: Secondary | ICD-10-CM | POA: Diagnosis not present

## 2017-08-05 DIAGNOSIS — I5032 Chronic diastolic (congestive) heart failure: Secondary | ICD-10-CM | POA: Diagnosis not present

## 2017-08-05 DIAGNOSIS — R202 Paresthesia of skin: Secondary | ICD-10-CM | POA: Diagnosis not present

## 2017-08-05 DIAGNOSIS — Z9181 History of falling: Secondary | ICD-10-CM | POA: Diagnosis not present

## 2017-08-05 DIAGNOSIS — M545 Low back pain: Secondary | ICD-10-CM | POA: Diagnosis not present

## 2017-08-05 DIAGNOSIS — Z87891 Personal history of nicotine dependence: Secondary | ICD-10-CM | POA: Diagnosis not present

## 2017-08-07 DIAGNOSIS — Z87891 Personal history of nicotine dependence: Secondary | ICD-10-CM | POA: Diagnosis not present

## 2017-08-07 DIAGNOSIS — Z7982 Long term (current) use of aspirin: Secondary | ICD-10-CM | POA: Diagnosis not present

## 2017-08-07 DIAGNOSIS — Z981 Arthrodesis status: Secondary | ICD-10-CM | POA: Diagnosis not present

## 2017-08-07 DIAGNOSIS — I130 Hypertensive heart and chronic kidney disease with heart failure and stage 1 through stage 4 chronic kidney disease, or unspecified chronic kidney disease: Secondary | ICD-10-CM | POA: Diagnosis not present

## 2017-08-07 DIAGNOSIS — I5032 Chronic diastolic (congestive) heart failure: Secondary | ICD-10-CM | POA: Diagnosis not present

## 2017-08-07 DIAGNOSIS — E1122 Type 2 diabetes mellitus with diabetic chronic kidney disease: Secondary | ICD-10-CM | POA: Diagnosis not present

## 2017-08-07 DIAGNOSIS — N183 Chronic kidney disease, stage 3 (moderate): Secondary | ICD-10-CM | POA: Diagnosis not present

## 2017-08-07 DIAGNOSIS — Z9181 History of falling: Secondary | ICD-10-CM | POA: Diagnosis not present

## 2017-08-07 DIAGNOSIS — M545 Low back pain: Secondary | ICD-10-CM | POA: Diagnosis not present

## 2017-08-07 DIAGNOSIS — I69354 Hemiplegia and hemiparesis following cerebral infarction affecting left non-dominant side: Secondary | ICD-10-CM | POA: Diagnosis not present

## 2017-08-07 DIAGNOSIS — R202 Paresthesia of skin: Secondary | ICD-10-CM | POA: Diagnosis not present

## 2017-08-11 DIAGNOSIS — N183 Chronic kidney disease, stage 3 (moderate): Secondary | ICD-10-CM | POA: Diagnosis not present

## 2017-08-11 DIAGNOSIS — Z9181 History of falling: Secondary | ICD-10-CM | POA: Diagnosis not present

## 2017-08-11 DIAGNOSIS — I5032 Chronic diastolic (congestive) heart failure: Secondary | ICD-10-CM | POA: Diagnosis not present

## 2017-08-11 DIAGNOSIS — Z981 Arthrodesis status: Secondary | ICD-10-CM | POA: Diagnosis not present

## 2017-08-11 DIAGNOSIS — I130 Hypertensive heart and chronic kidney disease with heart failure and stage 1 through stage 4 chronic kidney disease, or unspecified chronic kidney disease: Secondary | ICD-10-CM | POA: Diagnosis not present

## 2017-08-11 DIAGNOSIS — E1122 Type 2 diabetes mellitus with diabetic chronic kidney disease: Secondary | ICD-10-CM | POA: Diagnosis not present

## 2017-08-11 DIAGNOSIS — Z87891 Personal history of nicotine dependence: Secondary | ICD-10-CM | POA: Diagnosis not present

## 2017-08-11 DIAGNOSIS — R202 Paresthesia of skin: Secondary | ICD-10-CM | POA: Diagnosis not present

## 2017-08-11 DIAGNOSIS — Z7982 Long term (current) use of aspirin: Secondary | ICD-10-CM | POA: Diagnosis not present

## 2017-08-11 DIAGNOSIS — I69354 Hemiplegia and hemiparesis following cerebral infarction affecting left non-dominant side: Secondary | ICD-10-CM | POA: Diagnosis not present

## 2017-08-11 DIAGNOSIS — M545 Low back pain: Secondary | ICD-10-CM | POA: Diagnosis not present

## 2017-08-15 DIAGNOSIS — R202 Paresthesia of skin: Secondary | ICD-10-CM | POA: Diagnosis not present

## 2017-08-15 DIAGNOSIS — Z9181 History of falling: Secondary | ICD-10-CM | POA: Diagnosis not present

## 2017-08-15 DIAGNOSIS — Z7982 Long term (current) use of aspirin: Secondary | ICD-10-CM | POA: Diagnosis not present

## 2017-08-15 DIAGNOSIS — E1122 Type 2 diabetes mellitus with diabetic chronic kidney disease: Secondary | ICD-10-CM | POA: Diagnosis not present

## 2017-08-15 DIAGNOSIS — M545 Low back pain: Secondary | ICD-10-CM | POA: Diagnosis not present

## 2017-08-15 DIAGNOSIS — Z981 Arthrodesis status: Secondary | ICD-10-CM | POA: Diagnosis not present

## 2017-08-15 DIAGNOSIS — I130 Hypertensive heart and chronic kidney disease with heart failure and stage 1 through stage 4 chronic kidney disease, or unspecified chronic kidney disease: Secondary | ICD-10-CM | POA: Diagnosis not present

## 2017-08-15 DIAGNOSIS — Z87891 Personal history of nicotine dependence: Secondary | ICD-10-CM | POA: Diagnosis not present

## 2017-08-15 DIAGNOSIS — I69354 Hemiplegia and hemiparesis following cerebral infarction affecting left non-dominant side: Secondary | ICD-10-CM | POA: Diagnosis not present

## 2017-08-15 DIAGNOSIS — N183 Chronic kidney disease, stage 3 (moderate): Secondary | ICD-10-CM | POA: Diagnosis not present

## 2017-08-15 DIAGNOSIS — I5032 Chronic diastolic (congestive) heart failure: Secondary | ICD-10-CM | POA: Diagnosis not present

## 2017-08-18 DIAGNOSIS — M545 Low back pain: Secondary | ICD-10-CM | POA: Diagnosis not present

## 2017-08-18 DIAGNOSIS — I69354 Hemiplegia and hemiparesis following cerebral infarction affecting left non-dominant side: Secondary | ICD-10-CM | POA: Diagnosis not present

## 2017-08-18 DIAGNOSIS — Z9181 History of falling: Secondary | ICD-10-CM | POA: Diagnosis not present

## 2017-08-18 DIAGNOSIS — E1122 Type 2 diabetes mellitus with diabetic chronic kidney disease: Secondary | ICD-10-CM | POA: Diagnosis not present

## 2017-08-18 DIAGNOSIS — R202 Paresthesia of skin: Secondary | ICD-10-CM | POA: Diagnosis not present

## 2017-08-18 DIAGNOSIS — Z7982 Long term (current) use of aspirin: Secondary | ICD-10-CM | POA: Diagnosis not present

## 2017-08-18 DIAGNOSIS — Z981 Arthrodesis status: Secondary | ICD-10-CM | POA: Diagnosis not present

## 2017-08-18 DIAGNOSIS — N183 Chronic kidney disease, stage 3 (moderate): Secondary | ICD-10-CM | POA: Diagnosis not present

## 2017-08-18 DIAGNOSIS — Z87891 Personal history of nicotine dependence: Secondary | ICD-10-CM | POA: Diagnosis not present

## 2017-08-18 DIAGNOSIS — I130 Hypertensive heart and chronic kidney disease with heart failure and stage 1 through stage 4 chronic kidney disease, or unspecified chronic kidney disease: Secondary | ICD-10-CM | POA: Diagnosis not present

## 2017-08-18 DIAGNOSIS — I5032 Chronic diastolic (congestive) heart failure: Secondary | ICD-10-CM | POA: Diagnosis not present

## 2017-09-04 ENCOUNTER — Emergency Department (HOSPITAL_COMMUNITY)
Admission: EM | Admit: 2017-09-04 | Discharge: 2017-09-04 | Disposition: A | Discharge status: Home / Self Care | Payer: Medicare Other | Attending: Emergency Medicine | Admitting: Emergency Medicine

## 2017-09-04 ENCOUNTER — Emergency Department (HOSPITAL_COMMUNITY): Payer: Medicare Other

## 2017-09-04 ENCOUNTER — Encounter (HOSPITAL_COMMUNITY): Payer: Self-pay | Admitting: Emergency Medicine

## 2017-09-04 DIAGNOSIS — Z8673 Personal history of transient ischemic attack (TIA), and cerebral infarction without residual deficits: Secondary | ICD-10-CM | POA: Diagnosis not present

## 2017-09-04 DIAGNOSIS — R0789 Other chest pain: Secondary | ICD-10-CM

## 2017-09-04 DIAGNOSIS — Z79899 Other long term (current) drug therapy: Secondary | ICD-10-CM | POA: Diagnosis not present

## 2017-09-04 DIAGNOSIS — N183 Chronic kidney disease, stage 3 (moderate): Secondary | ICD-10-CM | POA: Insufficient documentation

## 2017-09-04 DIAGNOSIS — E1122 Type 2 diabetes mellitus with diabetic chronic kidney disease: Secondary | ICD-10-CM | POA: Diagnosis not present

## 2017-09-04 DIAGNOSIS — Z87891 Personal history of nicotine dependence: Secondary | ICD-10-CM | POA: Diagnosis not present

## 2017-09-04 DIAGNOSIS — I130 Hypertensive heart and chronic kidney disease with heart failure and stage 1 through stage 4 chronic kidney disease, or unspecified chronic kidney disease: Secondary | ICD-10-CM | POA: Insufficient documentation

## 2017-09-04 DIAGNOSIS — M5440 Lumbago with sciatica, unspecified side: Secondary | ICD-10-CM | POA: Diagnosis not present

## 2017-09-04 DIAGNOSIS — I5032 Chronic diastolic (congestive) heart failure: Secondary | ICD-10-CM | POA: Insufficient documentation

## 2017-09-04 DIAGNOSIS — R42 Dizziness and giddiness: Secondary | ICD-10-CM | POA: Diagnosis not present

## 2017-09-04 DIAGNOSIS — G8929 Other chronic pain: Secondary | ICD-10-CM

## 2017-09-04 LAB — CBC
HCT: 30.7 % — ABNORMAL LOW (ref 39.0–52.0)
Hemoglobin: 9.5 g/dL — ABNORMAL LOW (ref 13.0–17.0)
MCH: 25.5 pg — AB (ref 26.0–34.0)
MCHC: 30.9 g/dL (ref 30.0–36.0)
MCV: 82.3 fL (ref 78.0–100.0)
PLATELETS: 246 10*3/uL (ref 150–400)
RBC: 3.73 MIL/uL — AB (ref 4.22–5.81)
RDW: 15.4 % (ref 11.5–15.5)
WBC: 4.9 10*3/uL (ref 4.0–10.5)

## 2017-09-04 LAB — BASIC METABOLIC PANEL
Anion gap: 11 (ref 5–15)
BUN: 15 mg/dL (ref 6–20)
CALCIUM: 8.7 mg/dL — AB (ref 8.9–10.3)
CO2: 23 mmol/L (ref 22–32)
Chloride: 107 mmol/L (ref 101–111)
Creatinine, Ser: 1.43 mg/dL — ABNORMAL HIGH (ref 0.61–1.24)
GFR calc non Af Amer: 47 mL/min — ABNORMAL LOW (ref >60)
GFR, EST AFRICAN AMERICAN: 54 mL/min — AB (ref >60)
Glucose, Bld: 139 mg/dL — ABNORMAL HIGH (ref 65–99)
Potassium: 3.6 mmol/L (ref 3.5–5.1)
SODIUM: 141 mmol/L (ref 135–145)

## 2017-09-04 LAB — BRAIN NATRIURETIC PEPTIDE: B NATRIURETIC PEPTIDE 5: 24.3 pg/mL (ref 0.0–100.0)

## 2017-09-04 LAB — I-STAT TROPONIN, ED: TROPONIN I, POC: 0 ng/mL (ref 0.00–0.08)

## 2017-09-04 MED ORDER — MORPHINE SULFATE (PF) 4 MG/ML IV SOLN
4.0000 mg | Freq: Once | INTRAVENOUS | Status: AC
  Administered 2017-09-04: 4 mg via INTRAVENOUS
  Filled 2017-09-04: qty 1

## 2017-09-04 MED ORDER — IBUPROFEN 600 MG PO TABS: 600.0000 mg | ORAL_TABLET | Freq: Four times a day (QID) | ORAL | 0 refills | Status: DC | PRN

## 2017-09-04 MED ORDER — HYDROCODONE-ACETAMINOPHEN 7.5-325 MG PO TABS: 1.0000 | ORAL_TABLET | Freq: Four times a day (QID) | ORAL | 0 refills | Status: DC | PRN

## 2017-09-04 NOTE — ED Provider Notes (Signed)
[facility] EMERGENCY DEPARTMENT Provider Note   CSN: [id number] Arrival date & time: 09/04/17  0442     History   Chief Complaint Chief Complaint  Patient presents with  . Chest Pain    Dizziness  . Back Pain    HPI Luis Poole is a 77 y.o. male.  Patient is a 77 year old male with past medical history of diabetes, prior CVA, and prior back surgery.  He presents for evaluation of multiple complaints.  The issue he seems most concerned about is that of his low back.  He is reporting pain in his low back radiating into his leg.  When he attempts to lift his left leg he reports that he feels lightheaded and feels as though he is going to pass out.  He has a history of a paresis of the left lower extremity from a prior stroke but denies any worsening weakness, tingling, or bowel or bladder complaints.  He also complains of generalized weakness and intermittent tightness in his chest.  He denies any shortness of breath, nausea, or diaphoresis.   The history is provided by the patient.    Past Medical History:  Diagnosis Date  . Anemia, unspecified 06/08/2013  . Cataract   . CHF (congestive heart failure) (HCC)   . Chronic kidney disease   . Diabetes mellitus   . Hypertension   . S/P lumbar fusion   . Stroke [facility])     Patient Active Problem List   Diagnosis Date Noted  . Low back pain 06/28/2017  . Leg swelling 08/23/2013  . Left leg swelling 08/23/2013  . Neuropathy 08/23/2013  . Acute gout 08/23/2013  . D-dimer, elevated 08/23/2013  . CKD (chronic kidney disease), stage III (HCC) 08/23/2013  . Chronic diastolic heart failure (HCC) 08/23/2013  . Anemia 06/08/2013  . Hypertension 08/29/2012  . Diabetes mellitus type 2 in nonobese (HCC) 08/29/2012  . History of CVA (cerebrovascular accident) 08/28/2012    Class: Acute  . L-S radiculopathy 08/28/2012  . Lumbar post-laminectomy syndrome 01/15/2012  . Thoracic or lumbosacral neuritis or radiculitis,  unspecified 01/15/2012  . Left hemiparesis (HCC) 01/15/2012  . Diabetic peripheral neuropathy (HCC) 01/15/2012    Past Surgical History:  Procedure Laterality Date  . CATARACT EXTRACTION    . EYE SURGERY    . KNEE SURGERY  2006  . NECK SURGERY  2012  . SPINE SURGERY  2009  . TONSILECTOMY, ADENOIDECTOMY, BILATERAL MYRINGOTOMY AND TUBES         Home Medications    Prior to Admission medications   Medication Sig Start Date End Date Taking? Authorizing Provider  acetaminophen (TYLENOL) 650 MG CR tablet Take 1 tablet (650 mg total) by mouth 2 (two) times daily. Patient taking differently: Take 1,300 mg by mouth every 8 (eight) hours as needed for pain.  02/22/17   Derwood Kaplan, MD  amLODipine (NORVASC) 10 MG tablet Take 10 mg by mouth daily.     [provider]  aspirin buffered (BUFFERIN) 325 MG TABS tablet Take 325 mg by mouth daily.    [provider]  colchicine 0.6 MG tablet Take 1 tablet (0.6 mg total) daily by mouth. 06/29/17   Maxie Barb, MD  doxazosin (CARDURA) 8 MG tablet Take 8 mg by mouth at bedtime.     [provider]  multivitamin-lutein (OCUVITE-LUTEIN) CAPS capsule Take 1 capsule by mouth 2 (two) times daily.    [provider]  potassium chloride SA (K-DUR,KLOR-CON) 20 MEQ  tablet Take 1 tablet (20 mEq total) by mouth 2 (two) times daily. 05/26/17   Dione Booze, MD  pregabalin (LYRICA) 100 MG capsule Take 100 mg by mouth 2 (two) times daily.    [provider]  terbinafine (LAMISIL) 250 MG tablet Take 250 mg by mouth daily. 05/14/17   [provider]  valsartan (DIOVAN) 160 MG tablet Take 160 mg daily by mouth.    [provider]    Family History Family History  Problem Relation Age of Onset  . Stroke Mother     Social History Social History   Tobacco Use  . Smoking status: Former Games developer  . Smokeless tobacco: Former Engineer, water Use Topics  . Alcohol use: No  . Drug use: No      Allergies   Lisinopril and Gabapentin   Review of Systems Review of Systems  All other systems reviewed and are negative.    Physical Exam Updated Vital Signs BP (!) 141/60 (BP Location: Right Arm)   Pulse 74   Temp 97.8 F (36.6 C) (Oral)   Resp 14   SpO2 100%   Physical Exam  Constitutional: He is oriented to person, place, and time. He appears well-developed and well-nourished. No distress.  HENT:  Head: Normocephalic and atraumatic.  Mouth/Throat: Oropharynx is clear and moist.  Neck: Normal range of motion. Neck supple.  Cardiovascular: Normal rate and regular rhythm. Exam reveals no friction rub.  No murmur heard. Pulmonary/Chest: Effort normal and breath sounds normal. No respiratory distress. He has no wheezes. He has no rales.  Abdominal: Soft. Bowel sounds are normal. He exhibits no distension. There is no tenderness.  Musculoskeletal: Normal range of motion. He exhibits no edema.  Left lower extremity is in an orthotic brace.  Otherwise appears grossly normal with no obvious abnormality.  Neurological: He is alert and oriented to person, place, and time. Coordination normal.  DTRs are 2+ in the right lower extremity and trace in the left lower extremity.  Strength is 5 out of 5 in the right lower extremity.  He has a baseline paresis of the left lower leg.  Skin: Skin is warm and dry. He is not diaphoretic.  Nursing note and vitals reviewed.    ED Treatments / Results  Labs (all labs ordered are listed, but only abnormal results are displayed) Labs Reviewed  CBC - Abnormal; Notable for the following components:      Result Value   RBC 3.73 (*)    Hemoglobin 9.5 (*)    HCT 30.7 (*)    MCH 25.5 (*)    All other components within normal limits  BASIC METABOLIC PANEL  BRAIN NATRIURETIC PEPTIDE  I-STAT TROPONIN, ED    EKG  EKG Interpretation  Date/Time:  Thursday September 04 2017 04:54:26 EST Ventricular Rate:  74 PR Interval:    QRS  Duration: 148 QT Interval:  439 QTC Calculation: 488 R Axis:   54 Text Interpretation:  Sinus rhythm Borderline prolonged PR interval Right bundle branch block Confirmed by Geoffery Lyons ([id number]) on 09/04/2017 5:04:17 AM       Radiology No results found.  Procedures Procedures (including critical care time)  Medications Ordered in ED Medications  morphine 4 MG/ML injection 4 mg (not administered)     Initial Impression / Assessment and Plan / ED Course  I have reviewed the triage vital signs and the nursing notes.  Pertinent labs & imaging results that were available during my care of the patient  were reviewed by me and considered in my medical decision making (see chart for details).  Patient presents here with multiple complaints that are somewhat difficult to follow and explain.  He is complaining of dizziness when he lifts his left leg, low back pain, and intermittent chest and abdominal discomfort.  These issues seem to have been ongoing for several months.  Today's workup appears benign.  I see no evidence for any cardiac issue and with the exception of his leg weakness from his prior stroke, is neurologically intact.  At this point, I see no indication for admission.  I will prescribe a medication for pain and an anti-inflammatory and see how he responds.  If he is not improving in the next week, he should see his primary doctor to have his back further evaluated.  Final Clinical Impressions(s) / ED Diagnoses   Final diagnoses:  None    ED Discharge Orders    None       Geoffery Lyonselo, Daemon Dowty, MD 09/04/17 [phone]

## 2017-09-04 NOTE — Discharge Instructions (Signed)
Ibuprofen as prescribed.  Hydrocodone is prescribed as needed for pain not relieved with ibuprofen.  Follow-up with your primary doctor if not improving in the next week to discuss imaging studies.

## 2017-09-04 NOTE — ED Triage Notes (Signed)
Patient arrived with EMS from home with multiple complaints , reports generalized weakness, lightheaded , mid chest pain and low back pain onset this morning , CBG= 220 , denies SOB , no nausea or diaphoresis . History of CHF , DM /HTN .

## 2017-09-23 ENCOUNTER — Emergency Department (HOSPITAL_COMMUNITY)
Admission: EM | Admit: 2017-09-23 | Discharge: 2017-09-24 | Disposition: A | Discharge status: Home / Self Care | Payer: Medicare Other | Attending: Emergency Medicine | Admitting: Emergency Medicine

## 2017-09-23 ENCOUNTER — Encounter (HOSPITAL_COMMUNITY): Payer: Self-pay | Admitting: Emergency Medicine

## 2017-09-23 ENCOUNTER — Emergency Department (HOSPITAL_COMMUNITY): Payer: Medicare Other

## 2017-09-23 DIAGNOSIS — I130 Hypertensive heart and chronic kidney disease with heart failure and stage 1 through stage 4 chronic kidney disease, or unspecified chronic kidney disease: Secondary | ICD-10-CM | POA: Diagnosis not present

## 2017-09-23 DIAGNOSIS — Z7982 Long term (current) use of aspirin: Secondary | ICD-10-CM | POA: Insufficient documentation

## 2017-09-23 DIAGNOSIS — R2243 Localized swelling, mass and lump, lower limb, bilateral: Secondary | ICD-10-CM | POA: Diagnosis present

## 2017-09-23 DIAGNOSIS — Z79899 Other long term (current) drug therapy: Secondary | ICD-10-CM | POA: Diagnosis not present

## 2017-09-23 DIAGNOSIS — R600 Localized edema: Secondary | ICD-10-CM | POA: Diagnosis not present

## 2017-09-23 DIAGNOSIS — R609 Edema, unspecified: Secondary | ICD-10-CM

## 2017-09-23 DIAGNOSIS — Z8673 Personal history of transient ischemic attack (TIA), and cerebral infarction without residual deficits: Secondary | ICD-10-CM | POA: Insufficient documentation

## 2017-09-23 DIAGNOSIS — Z87891 Personal history of nicotine dependence: Secondary | ICD-10-CM | POA: Insufficient documentation

## 2017-09-23 DIAGNOSIS — E119 Type 2 diabetes mellitus without complications: Secondary | ICD-10-CM | POA: Insufficient documentation

## 2017-09-23 DIAGNOSIS — N183 Chronic kidney disease, stage 3 (moderate): Secondary | ICD-10-CM | POA: Diagnosis not present

## 2017-09-23 DIAGNOSIS — I5032 Chronic diastolic (congestive) heart failure: Secondary | ICD-10-CM | POA: Insufficient documentation

## 2017-09-23 LAB — CBC
HEMATOCRIT: 29.3 % — AB (ref 39.0–52.0)
Hemoglobin: 9.4 g/dL — ABNORMAL LOW (ref 13.0–17.0)
MCH: 25.6 pg — AB (ref 26.0–34.0)
MCHC: 32.1 g/dL (ref 30.0–36.0)
MCV: 79.8 fL (ref 78.0–100.0)
Platelets: 227 10*3/uL (ref 150–400)
RBC: 3.67 MIL/uL — ABNORMAL LOW (ref 4.22–5.81)
RDW: 15.7 % — AB (ref 11.5–15.5)
WBC: 6.8 10*3/uL (ref 4.0–10.5)

## 2017-09-23 LAB — BASIC METABOLIC PANEL
Anion gap: 9 (ref 5–15)
BUN: 14 mg/dL (ref 6–20)
CALCIUM: 9.2 mg/dL (ref 8.9–10.3)
CO2: 24 mmol/L (ref 22–32)
Chloride: 102 mmol/L (ref 101–111)
Creatinine, Ser: 1.19 mg/dL (ref 0.61–1.24)
GFR calc Af Amer: >60 mL/min (ref >60)
GFR calc non Af Amer: 58 mL/min — ABNORMAL LOW (ref >60)
GLUCOSE: 108 mg/dL — AB (ref 65–99)
Potassium: 3.6 mmol/L (ref 3.5–5.1)
Sodium: 135 mmol/L (ref 135–145)

## 2017-09-23 NOTE — ED Notes (Signed)
Failed attempt to collect labs. Pt did not want me to try 2nd time.

## 2017-09-23 NOTE — ED Triage Notes (Signed)
Patient presents c/o bilateral lower extremity swelling. Patient states "been going on a while, but got worse last night." patient states swelling has caused pain and making it difficult to ambulate. Reports he has an appt on the 18th with specialist but could not wait any longer.

## 2017-09-24 LAB — BRAIN NATRIURETIC PEPTIDE: B NATRIURETIC PEPTIDE 5: 42.4 pg/mL (ref 0.0–100.0)

## 2017-09-24 MED ORDER — FUROSEMIDE 40 MG PO TABS
20.0000 mg | ORAL_TABLET | Freq: Once | ORAL | Status: AC
  Administered 2017-09-24: 20 mg via ORAL
  Filled 2017-09-24: qty 1

## 2017-09-24 MED ORDER — IBUPROFEN 600 MG PO TABS: 600.0000 mg | ORAL_TABLET | Freq: Four times a day (QID) | ORAL | 0 refills | Status: DC | PRN

## 2017-09-24 MED ORDER — FUROSEMIDE 20 MG PO TABS: 20.0000 mg | ORAL_TABLET | Freq: Every day | ORAL | 0 refills | Status: DC

## 2017-09-24 NOTE — Discharge Instructions (Signed)
Call your doctor to schedule an appointment to be seen for recheck of swelling to legs. Take medications as prescribed.

## 2017-09-24 NOTE — ED Provider Notes (Signed)
[facility]-EMERGENCY DEPT Provider Note   CSN: [id number] Arrival date & time: 09/23/17  1508     History   Chief Complaint Chief Complaint  Patient presents with  . Leg Swelling    HPI Luis Poole is a 77 y.o. male.  Patient here with complaint of bilateral LE swelling that is recurrent. He states the pain has been increasing over the last couple of days and is interfering with walking at this point. No fever. No SOB or CP. He reports a history of CHF but is not on any diuretics. No falls. No fever.   The history is provided by the patient. No language interpreter was used.    Past Medical History:  Diagnosis Date  . Anemia, unspecified 06/08/2013  . Cataract   . CHF (congestive heart failure) (HCC)   . Chronic kidney disease   . Diabetes mellitus   . Hypertension   . S/P lumbar fusion   . Stroke [facility])     Patient Active Problem List   Diagnosis Date Noted  . Low back pain 06/28/2017  . Leg swelling 08/23/2013  . Left leg swelling 08/23/2013  . Neuropathy 08/23/2013  . Acute gout 08/23/2013  . D-dimer, elevated 08/23/2013  . CKD (chronic kidney disease), stage III (HCC) 08/23/2013  . Chronic diastolic heart failure (HCC) 08/23/2013  . Anemia 06/08/2013  . Hypertension 08/29/2012  . Diabetes mellitus type 2 in nonobese (HCC) 08/29/2012  . History of CVA (cerebrovascular accident) 08/28/2012    Class: Acute  . L-S radiculopathy 08/28/2012  . Lumbar post-laminectomy syndrome 01/15/2012  . Thoracic or lumbosacral neuritis or radiculitis, unspecified 01/15/2012  . Left hemiparesis (HCC) 01/15/2012  . Diabetic peripheral neuropathy (HCC) 01/15/2012    Past Surgical History:  Procedure Laterality Date  . CATARACT EXTRACTION    . EYE SURGERY    . KNEE SURGERY  2006  . NECK SURGERY  2012  . SPINE SURGERY  2009  . TONSILECTOMY, ADENOIDECTOMY, BILATERAL MYRINGOTOMY AND TUBES         Home Medications    Prior to Admission  medications   Medication Sig Start Date End Date Taking? Authorizing Provider  amLODipine (NORVASC) 10 MG tablet Take 10 mg by mouth daily.    Yes [provider]  aspirin EC 81 MG tablet Take 81 mg by mouth daily.   Yes [provider]  atorvastatin (LIPITOR) 20 MG tablet Take 20 mg by mouth daily.   Yes [provider]  colchicine 0.6 MG tablet Take 1 tablet (0.6 mg total) daily by mouth. Patient taking differently: Take 0.6 mg by mouth daily as needed (gout pain).  06/29/17  Yes Maxie Barb, MD  doxazosin (CARDURA) 8 MG tablet Take 8 mg by mouth at bedtime.    Yes [provider]  ferrous sulfate 325 (65 FE) MG tablet Take 325 mg by mouth daily with breakfast.   Yes [provider]  HYDROcodone-acetaminophen (NORCO) 7.5-325 MG tablet Take 1 tablet by mouth every 6 (six) hours as needed for moderate pain. 09/04/17  Yes Delo, Riley Lam, MD  ibuprofen (ADVIL,MOTRIN) 600 MG tablet Take 1 tablet (600 mg total) by mouth every 6 (six) hours as needed. Patient taking differently: Take 600 mg by mouth every 6 (six) hours as needed for moderate pain.  09/04/17  Yes Delo, Riley Lam, MD  pregabalin (LYRICA) 100 MG capsule Take 100 mg by mouth 2 (two) times daily.   Yes [provider]  terbinafine (LAMISIL) 250  MG tablet Take 250 mg by mouth daily. 05/14/17  Yes [provider]  valsartan (DIOVAN) 160 MG tablet Take 160 mg daily by mouth.   Yes [provider]  acetaminophen (TYLENOL) 650 MG CR tablet Take 1 tablet (650 mg total) by mouth 2 (two) times daily. Patient not taking: Reported on 09/23/2017 02/22/17   Derwood Kaplan, MD  OXcarbazepine (TRILEPTAL) 150 MG tablet Take 1 tablet (150 mg total) by mouth 2 (two) times daily. Patient not taking: Reported on 11/30/2015 02/08/13 11/30/15  Ranelle Oyster, MD    Family History Family History  Problem Relation Age of Onset  . Stroke Mother     Social History Social History    Tobacco Use  . Smoking status: Former Games developer  . Smokeless tobacco: Former Engineer, water Use Topics  . Alcohol use: No  . Drug use: No     Allergies   Lisinopril and Gabapentin   Review of Systems Review of Systems  Constitutional: Negative for chills and fever.  HENT: Negative.   Respiratory: Negative.  Negative for shortness of breath.   Cardiovascular: Positive for leg swelling. Negative for chest pain.  Gastrointestinal: Negative.  Negative for abdominal pain and nausea.  Musculoskeletal: Negative.        Bilateral LE pain  Skin: Negative.  Negative for color change and wound.  Neurological: Negative.      Physical Exam Updated Vital Signs BP (!) 180/99   Pulse 84   Resp 20   SpO2 100%   Physical Exam  Constitutional: He is oriented to person, place, and time. He appears well-developed and well-nourished.  HENT:  Head: Normocephalic.  Neck: Normal range of motion. Neck supple.  Cardiovascular: Normal rate, regular rhythm and intact distal pulses.  Pulmonary/Chest: Effort normal. He has no wheezes. He has rales (Faint bilateral lower lobe rales).  Abdominal: Soft. Bowel sounds are normal. There is no tenderness. There is no rebound and no guarding.  Musculoskeletal: Normal range of motion.  3+ edema in bilateral LE's that is tender to touch. No redness, weeping or warmth.  Neurological: He is alert and oriented to person, place, and time.  Skin: Skin is warm and dry. No rash noted.  Psychiatric: He has a normal mood and affect.     ED Treatments / Results  Labs (all labs ordered are listed, but only abnormal results are displayed) Labs Reviewed  CBC - Abnormal; Notable for the following components:      Result Value   RBC 3.67 (*)    Hemoglobin 9.4 (*)    HCT 29.3 (*)    MCH 25.6 (*)    RDW 15.7 (*)    All other components within normal limits  BASIC METABOLIC PANEL - Abnormal; Notable for the following components:   Glucose, Bld 108 (*)    GFR  calc non Af Amer 58 (*)    All other components within normal limits  BRAIN NATRIURETIC PEPTIDE    EKG  EKG Interpretation None       Radiology Dg Chest 2 View  Result Date: 09/24/2017 CLINICAL DATA:  CHF with leg swelling, short of breath EXAM: CHEST  2 VIEW COMPARISON:  09/04/2017 FINDINGS: Low lung volumes. No consolidation or significant effusion. Borderline to mild cardiomegaly with mild central congestion. No overt edema. No pneumothorax. IMPRESSION: Borderline cardiomegaly with slight central congestion. No overt edema or pleural effusion. Electronically Signed   By: Jasmine Pang M.D.   On: 09/24/2017 00:21  Procedures Procedures (including critical care time)  Medications Ordered in ED Medications - No data to display   Initial Impression / Assessment and Plan / ED Course  I have reviewed the triage vital signs and the nursing notes.  Pertinent labs & imaging results that were available during my care of the patient were reviewed by me and considered in my medical decision making (see chart for details).     Patient here with findings of bilateral LE edema, per patient, worsening over the last 2-3 days. No fever, SOB or CP. History of CHF.  Patient's labs show BNP 42, CXR has no edema, suggesting venous stasis over CHF. Lasix ordered. No evidence to suggest infection. VSS.   The patient is seen by Dr. Rhunette CroftNanavati and is felt appropriate for discharge home.    Final Clinical Impressions(s) / ED Diagnoses   Final diagnoses:  None   1. Peripheral edema  ED Discharge Orders    None       Elpidio AnisUpstill, Keriann Rankin, PA-C 09/24/17 [id number]    Derwood KaplanNanavati, Ankit, MD 09/25/17 [phone]

## 2017-10-06 ENCOUNTER — Encounter (HOSPITAL_COMMUNITY): Payer: Self-pay | Admitting: Emergency Medicine

## 2017-10-06 ENCOUNTER — Emergency Department (HOSPITAL_COMMUNITY): Payer: Medicare Other

## 2017-10-06 ENCOUNTER — Observation Stay (HOSPITAL_COMMUNITY)
Admission: EM | Admit: 2017-10-06 | Discharge: 2017-10-09 | Disposition: A | Discharge status: Home / Self Care | Payer: Medicare Other | Attending: Internal Medicine | Admitting: Internal Medicine

## 2017-10-06 ENCOUNTER — Other Ambulatory Visit: Payer: Self-pay

## 2017-10-06 ENCOUNTER — Emergency Department (HOSPITAL_BASED_OUTPATIENT_CLINIC_OR_DEPARTMENT_OTHER)
Admit: 2017-10-06 | Discharge: 2017-10-06 | Disposition: A | Discharge status: Home / Self Care | Payer: Medicare Other | Attending: Emergency Medicine | Admitting: Emergency Medicine

## 2017-10-06 DIAGNOSIS — W19XXXA Unspecified fall, initial encounter: Secondary | ICD-10-CM

## 2017-10-06 DIAGNOSIS — I700 Atherosclerosis of aorta: Secondary | ICD-10-CM | POA: Diagnosis not present

## 2017-10-06 DIAGNOSIS — R609 Edema, unspecified: Secondary | ICD-10-CM | POA: Diagnosis not present

## 2017-10-06 DIAGNOSIS — M109 Gout, unspecified: Secondary | ICD-10-CM | POA: Insufficient documentation

## 2017-10-06 DIAGNOSIS — Z7982 Long term (current) use of aspirin: Secondary | ICD-10-CM | POA: Diagnosis not present

## 2017-10-06 DIAGNOSIS — E119 Type 2 diabetes mellitus without complications: Secondary | ICD-10-CM

## 2017-10-06 DIAGNOSIS — E1169 Type 2 diabetes mellitus with other specified complication: Secondary | ICD-10-CM

## 2017-10-06 DIAGNOSIS — R262 Difficulty in walking, not elsewhere classified: Secondary | ICD-10-CM | POA: Insufficient documentation

## 2017-10-06 DIAGNOSIS — R079 Chest pain, unspecified: Secondary | ICD-10-CM | POA: Diagnosis not present

## 2017-10-06 DIAGNOSIS — E876 Hypokalemia: Secondary | ICD-10-CM | POA: Diagnosis not present

## 2017-10-06 DIAGNOSIS — I5032 Chronic diastolic (congestive) heart failure: Secondary | ICD-10-CM | POA: Diagnosis present

## 2017-10-06 DIAGNOSIS — Z79899 Other long term (current) drug therapy: Secondary | ICD-10-CM | POA: Diagnosis not present

## 2017-10-06 DIAGNOSIS — G9389 Other specified disorders of brain: Secondary | ICD-10-CM | POA: Insufficient documentation

## 2017-10-06 DIAGNOSIS — I10 Essential (primary) hypertension: Secondary | ICD-10-CM | POA: Diagnosis present

## 2017-10-06 DIAGNOSIS — R55 Syncope and collapse: Secondary | ICD-10-CM | POA: Diagnosis not present

## 2017-10-06 DIAGNOSIS — M4050 Lordosis, unspecified, site unspecified: Secondary | ICD-10-CM | POA: Insufficient documentation

## 2017-10-06 DIAGNOSIS — I214 Non-ST elevation (NSTEMI) myocardial infarction: Secondary | ICD-10-CM | POA: Diagnosis present

## 2017-10-06 DIAGNOSIS — Z888 Allergy status to other drugs, medicaments and biological substances status: Secondary | ICD-10-CM | POA: Insufficient documentation

## 2017-10-06 DIAGNOSIS — M25552 Pain in left hip: Secondary | ICD-10-CM | POA: Diagnosis present

## 2017-10-06 DIAGNOSIS — I130 Hypertensive heart and chronic kidney disease with heart failure and stage 1 through stage 4 chronic kidney disease, or unspecified chronic kidney disease: Principal | ICD-10-CM | POA: Insufficient documentation

## 2017-10-06 DIAGNOSIS — G8194 Hemiplegia, unspecified affecting left nondominant side: Secondary | ICD-10-CM | POA: Diagnosis present

## 2017-10-06 LAB — BASIC METABOLIC PANEL
Anion gap: 11 (ref 5–15)
BUN: 14 mg/dL (ref 6–20)
CO2: 23 mmol/L (ref 22–32)
Calcium: 8.9 mg/dL (ref 8.9–10.3)
Chloride: 109 mmol/L (ref 101–111)
Creatinine, Ser: 1.33 mg/dL — ABNORMAL HIGH (ref 0.61–1.24)
GFR calc Af Amer: 59 mL/min — ABNORMAL LOW (ref >60)
GFR calc non Af Amer: 51 mL/min — ABNORMAL LOW (ref >60)
Glucose, Bld: 122 mg/dL — ABNORMAL HIGH (ref 65–99)
Potassium: 3.4 mmol/L — ABNORMAL LOW (ref 3.5–5.1)
Sodium: 143 mmol/L (ref 135–145)

## 2017-10-06 LAB — URINALYSIS, ROUTINE W REFLEX MICROSCOPIC
Bilirubin Urine: NEGATIVE
Glucose, UA: NEGATIVE mg/dL
Hgb urine dipstick: NEGATIVE
Ketones, ur: NEGATIVE mg/dL
Leukocytes, UA: NEGATIVE
Nitrite: NEGATIVE
Protein, ur: NEGATIVE mg/dL
Specific Gravity, Urine: 1.044 — ABNORMAL HIGH (ref 1.005–1.030)
pH: 5 (ref 5.0–8.0)

## 2017-10-06 LAB — CBC
HEMATOCRIT: 29.9 % — AB (ref 39.0–52.0)
HEMOGLOBIN: 9.2 g/dL — AB (ref 13.0–17.0)
MCH: 24.7 pg — ABNORMAL LOW (ref 26.0–34.0)
MCHC: 30.8 g/dL (ref 30.0–36.0)
MCV: 80.2 fL (ref 78.0–100.0)
Platelets: 279 10*3/uL (ref 150–400)
RBC: 3.73 MIL/uL — ABNORMAL LOW (ref 4.22–5.81)
RDW: 15.6 % — ABNORMAL HIGH (ref 11.5–15.5)
WBC: 6.5 10*3/uL (ref 4.0–10.5)

## 2017-10-06 LAB — BRAIN NATRIURETIC PEPTIDE: B NATRIURETIC PEPTIDE 5: 32.9 pg/mL (ref 0.0–100.0)

## 2017-10-06 LAB — I-STAT TROPONIN, ED: Troponin i, poc: 0 ng/mL (ref 0.00–0.08)

## 2017-10-06 LAB — CBG MONITORING, ED: Glucose-Capillary: 136 mg/dL — ABNORMAL HIGH (ref 65–99)

## 2017-10-06 MED ORDER — IRBESARTAN 150 MG PO TABS
150.0000 mg | ORAL_TABLET | Freq: Every day | ORAL | Status: DC
  Administered 2017-10-07 – 2017-10-09 (×3): 150 mg via ORAL
  Filled 2017-10-06 (×3): qty 1

## 2017-10-06 MED ORDER — HYDROCODONE-ACETAMINOPHEN 7.5-325 MG PO TABS
1.0000 | ORAL_TABLET | Freq: Four times a day (QID) | ORAL | Status: DC | PRN
  Administered 2017-10-07 – 2017-10-08 (×6): 1 via ORAL
  Filled 2017-10-06 (×6): qty 1

## 2017-10-06 MED ORDER — PREGABALIN 50 MG PO CAPS
100.0000 mg | ORAL_CAPSULE | Freq: Two times a day (BID) | ORAL | Status: DC
Start: 2017-10-06 — End: 2017-10-09
  Administered 2017-10-07 – 2017-10-09 (×6): 100 mg via ORAL
  Filled 2017-10-06 (×6): qty 2

## 2017-10-06 MED ORDER — INSULIN ASPART 100 UNIT/ML ~~LOC~~ SOLN: 0.0000 [IU] | Freq: Three times a day (TID) | SUBCUTANEOUS | Status: DC

## 2017-10-06 MED ORDER — SODIUM CHLORIDE 0.9 % IJ SOLN
INTRAMUSCULAR | Status: AC
  Filled 2017-10-06: qty 50

## 2017-10-06 MED ORDER — TERBINAFINE HCL 250 MG PO TABS
250.0000 mg | ORAL_TABLET | Freq: Every day | ORAL | Status: DC
  Administered 2017-10-07 – 2017-10-09 (×3): 250 mg via ORAL
  Filled 2017-10-06 (×3): qty 1

## 2017-10-06 MED ORDER — ENOXAPARIN SODIUM 40 MG/0.4ML ~~LOC~~ SOLN
40.0000 mg | Freq: Every day | SUBCUTANEOUS | Status: DC
  Administered 2017-10-07 – 2017-10-08 (×3): 40 mg via SUBCUTANEOUS
  Filled 2017-10-06 (×3): qty 0.4

## 2017-10-06 MED ORDER — AMLODIPINE BESYLATE 10 MG PO TABS
10.0000 mg | ORAL_TABLET | Freq: Every day | ORAL | Status: DC
  Administered 2017-10-07 – 2017-10-09 (×3): 10 mg via ORAL
  Filled 2017-10-06 (×3): qty 1

## 2017-10-06 MED ORDER — IOPAMIDOL (ISOVUE-370) INJECTION 76%
INTRAVENOUS | Status: AC
  Filled 2017-10-06: qty 100

## 2017-10-06 MED ORDER — ASPIRIN 81 MG PO CHEW
324.0000 mg | CHEWABLE_TABLET | Freq: Once | ORAL | Status: AC
Start: 2017-10-06 — End: 2017-10-06
  Administered 2017-10-06: 324 mg via ORAL
  Filled 2017-10-06: qty 4

## 2017-10-06 MED ORDER — ATORVASTATIN CALCIUM 20 MG PO TABS: 20.0000 mg | ORAL_TABLET | Freq: Every day | ORAL | Status: DC

## 2017-10-06 MED ORDER — ASPIRIN EC 81 MG PO TBEC
81.0000 mg | DELAYED_RELEASE_TABLET | Freq: Every day | ORAL | Status: DC
  Administered 2017-10-07 – 2017-10-09 (×3): 81 mg via ORAL
  Filled 2017-10-06 (×3): qty 1

## 2017-10-06 MED ORDER — FERROUS SULFATE 325 (65 FE) MG PO TABS
325.0000 mg | ORAL_TABLET | Freq: Every day | ORAL | Status: DC
  Administered 2017-10-07 – 2017-10-09 (×3): 325 mg via ORAL
  Filled 2017-10-06 (×2): qty 1

## 2017-10-06 MED ORDER — DOXAZOSIN MESYLATE 4 MG PO TABS
8.0000 mg | ORAL_TABLET | Freq: Every day | ORAL | Status: DC
  Administered 2017-10-07 – 2017-10-08 (×3): 8 mg via ORAL
  Filled 2017-10-06 (×3): qty 2

## 2017-10-06 MED ORDER — POTASSIUM CHLORIDE CRYS ER 20 MEQ PO TBCR
40.0000 meq | EXTENDED_RELEASE_TABLET | Freq: Once | ORAL | Status: AC
  Administered 2017-10-07: 40 meq via ORAL
  Filled 2017-10-06: qty 2

## 2017-10-06 MED ORDER — IOPAMIDOL (ISOVUE-370) INJECTION 76%
INTRAVENOUS | Status: AC
  Administered 2017-10-06: 100 mL
  Filled 2017-10-06: qty 100

## 2017-10-06 NOTE — ED Notes (Signed)
ED TO INPATIENT HANDOFF REPORT  Name/Age/Gender Luis Poole 77 y.o. male  Code Status Code Status History    Date Active Date Inactive Code Status Order ID Comments User Context   06/28/2017 02:56 06/29/2017 18:56 Full Code [id number]  Rise Patience, MD ED   08/23/2013 02:26 08/24/2013 18:11 Full Code [id number]  Berle Mull, MD Inpatient    Advance Directive Documentation     Most Recent Value  Type of Advance Directive  Healthcare Power of Attorney  Pre-existing out of facility DNR order (yellow form or pink MOST form)  No data  "MOST" Form in Place?  No data      Home/SNF/Other Home  Chief Complaint syncope  Level of Care/Admitting Diagnosis ED Disposition    ED Disposition Condition Comment   Admit  Hospital Area: [facility] [[id number]]  Level of Care: Telemetry [5]  Admit to tele based on following criteria: Other see comments  Comments: Syncope  Diagnosis: Syncope [206001]  Admitting Physician: Bethena Roys [[id number]]  Attending Physician: Bethena Roys Nessa.Cuff  PT Class (Do Not Modify): Observation [104]  PT Acc Code (Do Not Modify): Observation [10022]       Medical History Past Medical History:  Diagnosis Date  . Anemia, unspecified 06/08/2013  . Cataract   . CHF (congestive heart failure) ([place])   . Chronic kidney disease   . Diabetes mellitus   . Hypertension   . S/P lumbar fusion   . Stroke [facility])     Allergies Allergies  Allergen Reactions  . Lisinopril Anaphylaxis  . Gabapentin Other (See Comments)    Dizziness    IV Location/Drains/Wounds Patient Lines/Drains/Airways Status   Active Line/Drains/Airways    Name:   Placement date:   Placement time:   Site:   Days:   Peripheral IV 10/06/17 Left Antecubital   10/06/17    1336    Antecubital   less than 1          Labs/Imaging Results for orders placed or performed during the hospital encounter of 10/06/17 (from the past 48 hour(s))  CBG monitoring,  ED     Status: Abnormal   Collection Time: 10/06/17  1:57 PM  Result Value Ref Range   Glucose-Capillary 136 (H) 65 - 99 mg/dL  Basic metabolic panel     Status: Abnormal   Collection Time: 10/06/17  2:51 PM  Result Value Ref Range   Sodium 143 135 - 145 mmol/L   Potassium 3.4 (L) 3.5 - 5.1 mmol/L   Chloride 109 101 - 111 mmol/L   CO2 23 22 - 32 mmol/L   Glucose, Bld 122 (H) 65 - 99 mg/dL   BUN 14 6 - 20 mg/dL   Creatinine, Ser 1.33 (H) 0.61 - 1.24 mg/dL   Calcium 8.9 8.9 - 10.3 mg/dL   GFR calc non Af Amer 51 (L) >60 mL/min   GFR calc Af Amer 59 (L) >60 mL/min    Comment: (NOTE) The eGFR has been calculated using the CKD EPI equation. This calculation has not been validated in all clinical situations. eGFR's persistently <60 mL/min signify possible Chronic Kidney Disease.    Anion gap 11 5 - 15    Comment: Performed at [facility], [place] [address] [place], [place] [address]  Brain natriuretic peptide     Status: None   Collection Time: 10/06/17  4:39 PM  Result Value Ref Range   B Natriuretic Peptide 32.9 0.0 - 100.0 pg/mL  Comment: Performed at [facility], [place] [address] [place], [place] [address]  CBC     Status: Abnormal   Collection Time: 10/06/17  4:39 PM  Result Value Ref Range   WBC 6.5 4.0 - 10.5 K/uL   RBC 3.73 (L) 4.22 - 5.81 MIL/uL   Hemoglobin 9.2 (L) 13.0 - 17.0 g/dL   HCT 29.9 (L) 39.0 - 52.0 %   MCV 80.2 78.0 - 100.0 fL   MCH 24.7 (L) 26.0 - 34.0 pg   MCHC 30.8 30.0 - 36.0 g/dL   RDW 15.6 (H) 11.5 - 15.5 %   Platelets 279 150 - 400 K/uL    Comment: Performed at [facility], [place] [address] [place], [place] [address]  I-stat troponin, ED     Status: None   Collection Time: 10/06/17  4:49 PM  Result Value Ref Range   Troponin i, poc 0.00 0.00 - 0.08 ng/mL   Comment 3            Comment: Due to the release kinetics of cTnI, a negative result within the first hours of the onset of symptoms  does not rule out myocardial infarction with certainty. If myocardial infarction is still suspected, repeat the test at appropriate intervals.   Urinalysis, Routine w reflex microscopic     Status: Abnormal   Collection Time: 10/06/17  9:00 PM  Result Value Ref Range   Color, Urine YELLOW YELLOW   APPearance CLEAR CLEAR   Specific Gravity, Urine 1.044 (H) 1.005 - 1.030   pH 5.0 5.0 - 8.0   Glucose, UA NEGATIVE NEGATIVE mg/dL   Hgb urine dipstick NEGATIVE NEGATIVE   Bilirubin Urine NEGATIVE NEGATIVE   Ketones, ur NEGATIVE NEGATIVE mg/dL   Protein, ur NEGATIVE NEGATIVE mg/dL   Nitrite NEGATIVE NEGATIVE   Leukocytes, UA NEGATIVE NEGATIVE    Comment: Performed at [facility] [address] [place], [place] [address]     Pending Labs Unresulted Labs (From admission, onward)   None      Vitals/Pain Today's Vitals   10/06/17 2000 10/06/17 2030 10/06/17 2100 10/06/17 2130  BP: 139/90 (!) 144/89 131/73 134/79  Pulse: 86 80 70 66  Resp: 17 (!) 23 14 10   Temp:      TempSrc:      SpO2: 97% 100% 99% 100%  PainSc:        Isolation Precautions No active isolations  Medications Medications  sodium chloride 0.9 % injection (not administered)  sodium chloride 0.9 % injection (not administered)  iopamidol (ISOVUE-370) 76 % injection (not administered)  iopamidol (ISOVUE-370) 76 % injection (100 mLs  Contrast Given 10/06/17 1723)  aspirin chewable tablet 324 mg (324 mg Oral Given 10/06/17 2011)    Mobility manual wheelchair

## 2017-10-06 NOTE — ED Notes (Signed)
Provided water with permission [place].
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
## 2017-10-06 NOTE — ED Triage Notes (Addendum)
Pt episodes of near syncope/dizziness; episodes of hypotension. Two falls last night with left hip/leg pain no shortening/rotation noted. Recent prescription for lasix; pt believes it is causing his dizziness.

## 2017-10-06 NOTE — Progress Notes (Signed)
Left lower extremity venous duplex has been completed. Negative for DVT. Results were given to Dr. Juleen ChinaKohut.  10/06/17 6:17 PM Olen CordialGreg Emmerson Shuffield RVT

## 2017-10-06 NOTE — ED Notes (Signed)
Patient is in CT.
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
## 2017-10-06 NOTE — ED Notes (Signed)
Gave report to Abby, RN.  

## 2017-10-07 ENCOUNTER — Observation Stay (HOSPITAL_BASED_OUTPATIENT_CLINIC_OR_DEPARTMENT_OTHER): Payer: Medicare Other

## 2017-10-07 DIAGNOSIS — R55 Syncope and collapse: Secondary | ICD-10-CM | POA: Diagnosis not present

## 2017-10-07 DIAGNOSIS — I5032 Chronic diastolic (congestive) heart failure: Secondary | ICD-10-CM | POA: Diagnosis not present

## 2017-10-07 DIAGNOSIS — E876 Hypokalemia: Secondary | ICD-10-CM | POA: Diagnosis not present

## 2017-10-07 DIAGNOSIS — I340 Nonrheumatic mitral (valve) insufficiency: Secondary | ICD-10-CM

## 2017-10-07 DIAGNOSIS — I130 Hypertensive heart and chronic kidney disease with heart failure and stage 1 through stage 4 chronic kidney disease, or unspecified chronic kidney disease: Secondary | ICD-10-CM | POA: Diagnosis not present

## 2017-10-07 DIAGNOSIS — T671XXS Heat syncope, sequela: Secondary | ICD-10-CM | POA: Diagnosis not present

## 2017-10-07 LAB — ECHOCARDIOGRAM COMPLETE
E decel time: 338 msec
E/e' ratio: 9
FS: 32 % (ref 28–44)
Height: 67 in
IVS/LV PW RATIO, ED: 1.19
LADIAMINDEX: 1.8 cm/m2
LASIZE: 40 mm
LAVOL: 51.7 mL
LAVOLA4C: 54.5 mL
LAVOLIN: 23.3 mL/m2
LDCA: 3.14 cm2
LEFT ATRIUM END SYS DIAM: 40 mm
LV E/e' medial: 9
LV E/e'average: 9
LV PW d: 12.8 mm — AB (ref 0.6–1.1)
LV TDI E'MEDIAL: 7.62
LVELAT: 10.2 cm/s
LVOT VTI: 28.8 cm
LVOT peak grad rest: 9 mmHg
LVOT peak vel: 149 cm/s
LVOTD: 20 mm
LVOTSV: 90 mL
MV Dec: 338
MV Peak grad: 3 mmHg
MV pk A vel: 93.6 m/s
MVPKEVEL: 91.8 m/s
RV LATERAL S' VELOCITY: 20.8 cm/s
RV TAPSE: 20.3 mm
TDI e' lateral: 10.2
Weight: 3548.52 oz

## 2017-10-07 LAB — BASIC METABOLIC PANEL
Anion gap: 10 (ref 5–15)
BUN: 13 mg/dL (ref 6–20)
CO2: 22 mmol/L (ref 22–32)
CREATININE: 1.01 mg/dL (ref 0.61–1.24)
Calcium: 8.6 mg/dL — ABNORMAL LOW (ref 8.9–10.3)
Chloride: 108 mmol/L (ref 101–111)
GFR calc Af Amer: >60 mL/min (ref >60)
GFR calc non Af Amer: >60 mL/min (ref >60)
GLUCOSE: 109 mg/dL — AB (ref 65–99)
POTASSIUM: 3.8 mmol/L (ref 3.5–5.1)
SODIUM: 140 mmol/L (ref 135–145)

## 2017-10-07 LAB — IRON AND TIBC
Iron: 30 ug/dL — ABNORMAL LOW (ref 45–182)
SATURATION RATIOS: 13 % — AB (ref 17.9–39.5)
TIBC: 234 ug/dL — AB (ref 250–450)
UIBC: 204 ug/dL

## 2017-10-07 LAB — LIPID PANEL
CHOL/HDL RATIO: 3.7 ratio
Cholesterol: 116 mg/dL (ref 0–200)
HDL: 31 mg/dL — ABNORMAL LOW (ref >40)
LDL Cholesterol: 74 mg/dL (ref 0–99)
Triglycerides: 54 mg/dL (ref <150)
VLDL: 11 mg/dL (ref 0–40)

## 2017-10-07 LAB — GLUCOSE, CAPILLARY
GLUCOSE-CAPILLARY: 93 mg/dL (ref 65–99)
Glucose-Capillary: 112 mg/dL — ABNORMAL HIGH (ref 65–99)
Glucose-Capillary: 98 mg/dL (ref 65–99)
Glucose-Capillary: 106 mg/dL — ABNORMAL HIGH (ref 65–99)

## 2017-10-07 LAB — FERRITIN: Ferritin: 41 ng/mL (ref 24–336)

## 2017-10-07 LAB — TROPONIN I: Troponin I: <0.03 ng/mL (ref <0.03)

## 2017-10-07 MED ORDER — LORAZEPAM 2 MG/ML IJ SOLN
0.5000 mg | Freq: Once | INTRAMUSCULAR | Status: AC | PRN
  Administered 2017-10-08: 0.5 mg via INTRAVENOUS
  Filled 2017-10-07: qty 1

## 2017-10-07 MED ORDER — ATORVASTATIN CALCIUM 40 MG PO TABS
40.0000 mg | ORAL_TABLET | Freq: Every day | ORAL | Status: DC
  Administered 2017-10-07 – 2017-10-08 (×3): 40 mg via ORAL
  Filled 2017-10-07 (×3): qty 1

## 2017-10-07 NOTE — Progress Notes (Signed)
*  PRELIMINARY RESULTS* Vascular Ultrasound Carotid Duplex (Doppler) has been completed.   Findings suggest 1-39% internal carotid artery stenosis bilaterally. Vertebral arteries are patent with antegrade flow.  10/07/2017 3:15 PM Gertie FeyMichelle Sutter Ahlgren, BS, RVT, RDCS, RDMS

## 2017-10-07 NOTE — Progress Notes (Signed)
  Echocardiogram 2D Echocardiogram has been performed.  Luis SkeenVijay  [name] 10/07/2017, 9:50 AM

## 2017-10-07 NOTE — Progress Notes (Signed)
Medications administered by student RN [id number] with supervision of Clinical Instructor Layza Summa MSN, RN-BC or patient's assigned RN.   

## 2017-10-07 NOTE — Evaluation (Signed)
Physical Therapy Evaluation Patient Details Name: Luis OdeaRobert P Sanpedro MRN: [id number] DOB: 05/11/1941 Today's Date: 10/07/2017   History of Present Illness  77 y.o. 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  Clinical Impression  Pt admitted with above diagnosis. Pt currently with functional limitations due to the deficits listed below (see PT Problem List).  Pt will benefit from skilled PT to increase their independence and safety with mobility to allow discharge to the venue listed below.   Pt reports increased fatigue and weakness leading up to admission.  Pt typically ambulatory with rollator however has been using w/c more recently.  Pt plans to d/c home with family assist and would like HHPT upon d/c.     Follow Up Recommendations Home health PT;Supervision for mobility/OOB    Equipment Recommendations  Rolling walker with 5" wheels    Recommendations for Other Services       Precautions / Restrictions Precautions Precautions: Fall      Mobility  Bed Mobility Overal bed mobility: Needs Assistance Bed Mobility: Supine to Sit     Supine to sit: Min guard;HOB elevated     General bed mobility comments: increased time and effort however no physical assist required  Transfers Overall transfer level: Needs assistance Equipment used: Rolling walker (2 wheeled) Transfers: Sit to/from Stand Sit to Stand: Min assist;From elevated surface         General transfer comment: slight boost to assist with rise, elevated bed to assist as well  Ambulation/Gait Ambulation/Gait assistance: Min guard Ambulation Distance (Feet): 15 Feet Assistive device: Rolling walker (2 wheeled) Gait Pattern/deviations: Step-through pattern;Decreased stride length     General Gait Details: pt assisted with ambulating short distance, distance limited due  to fatigue and weakness especially in L LE per pt, AFO not present in room today, SPo2 in 90s on room air upon returning to recliner; pt reports only dizziness was mild and initially upon standing then resolved  Stairs            Wheelchair Mobility    Modified Rankin (Stroke Patients Only)       Balance Overall balance assessment: Needs assistance         Standing balance support: Bilateral upper extremity supported Standing balance-Leahy Scale: Poor                               Pertinent Vitals/Pain Pain Assessment: 0-10 Pain Score: 4  Pain Location: L leg (reports chronic nature) Pain Descriptors / Indicators: Aching Pain Intervention(s): Limited activity within patient's tolerance;Monitored during session;Repositioned    Home Living Family/patient expects to be discharged to:: Private residence Living Arrangements: Other relatives(brother and sister) Available Help at Discharge: Family;Available 24 hours/day Type of Home: House Home Access: Ramped entrance     Home Layout: One level Home Equipment: Bedside commode;Walker - 4 wheels;Wheelchair - manual      Prior Function Level of Independence: Independent with assistive device(s)         Comments: ambulates in home with walker but uses WC for distance     Hand Dominance        Extremity/Trunk Assessment        Lower Extremity Assessment Lower Extremity Assessment: Generalized weakness;LLE deficits/detail LLE Deficits / Details: hx of L LE deficits from previous stroke, drop foot, has AFO however  not present in room       Communication   Communication: No difficulties  Cognition Arousal/Alertness: Awake/alert Behavior During Therapy: WFL for tasks assessed/performed Overall Cognitive Status: Within Functional Limits for tasks assessed                                        General Comments      Exercises     Assessment/Plan    PT Assessment Patient  needs continued PT services  PT Problem List Decreased strength;Decreased mobility;Decreased activity tolerance;Decreased balance       PT Treatment Interventions Gait training;Therapeutic exercise;DME instruction;Therapeutic activities;Patient/family education;Functional mobility training;Balance training    PT Goals (Current goals can be found in the Care Plan section)  Acute Rehab PT Goals PT Goal Formulation: With patient Time For Goal Achievement: 10/07/17 Potential to Achieve Goals: Good    Frequency Min 3X/week   Barriers to discharge        Co-evaluation               AM-PAC PT "6 Clicks" Daily Activity  Outcome Measure Difficulty turning over in bed (including adjusting bedclothes, sheets and blankets)?: None Difficulty moving from lying on back to sitting on the side of the bed? : A Lot Difficulty sitting down on and standing up from a chair with arms (e.g., wheelchair, bedside commode, etc,.)?: Unable Help needed moving to and from a bed to chair (including a wheelchair)?: A Little Help needed walking in hospital room?: A Little Help needed climbing 3-5 steps with a railing? : A Lot 6 Click Score: 15    End of Session Equipment Utilized During Treatment: Gait belt Activity Tolerance: Patient limited by fatigue Patient left: in chair;with chair alarm set;with call bell/phone within reach Nurse Communication: Mobility status PT Visit Diagnosis: Difficulty in walking, not elsewhere classified (R26.2);Muscle weakness (generalized) (M62.81)    Time: 1191-4782 PT Time Calculation (min) (ACUTE ONLY): 24 min   Charges:   PT Evaluation $PT Eval Low Complexity: 1 Low     PT G Codes:       Zenovia Jarred, PT, DPT 10/07/2017 Pager: [phone]  Maida Sale E 10/07/2017, 1:15 PM
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
## 2017-10-07 NOTE — Consult Note (Signed)
CARDIOLOGY CONSULT NOTE       Patient ID: Luis OdeaRobert P Poole MRN: [id number] DOB/AGE: 75/11/1940 [age] y.o.  Admit date: 10/06/2017 Referring Physician: Lowell GuitarPowell Primary Physician: System, Pcp Not In Primary Cardiologist: New/Nishan Reason for Consultation: Syncope  Principal Problem:   Syncope Active Problems:   Left hemiparesis (HCC)   Hypertension   Diabetes mellitus type 2 in nonobese (HCC)   Chronic diastolic heart failure (HCC)   Chest pain   HPI:  77 y.o. 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 Admitted with weakness and dizziness no frank syncope. Had dizziness using bedside commode and urinating yesterday. He has some LLE weakness from stroke and poor balance and uses a 4 point walker. There is mention of SSCP in chart but patient only indicates indigestion to me No dyspnea palpitations No history of CAD arrhythmia or valve disease. Had some sharp pain after going to bathroom lasting 45 minutes associated with dyspnea. In ER rhythm normal , r/o no acute ECG changes CTA negative PE and mentions 3 vessel coronary calcific atherosclerosis . This am is asymptomatic TTE being done at bedside and preliminary review by me shows normal EF 65-70%  ROS All other systems reviewed and negative except as noted above  Past Medical History:  Diagnosis Date  . Anemia, unspecified 06/08/2013  . Cataract   . CHF (congestive heart failure) (HCC)   . Chronic kidney disease   . Diabetes mellitus   . Hypertension   . S/P lumbar fusion   . Stroke [facility](HCC)     Family History  Problem Relation Age of Onset  . Stroke Mother     Social History   Socioeconomic History  . Marital status: Single    Spouse name: Not on file  . Number of children: Not on file  . Years of education: Not on file  . Highest education level: Not on file  Social Needs  . Financial resource strain: Not on file  . Food insecurity - worry: Not on file  . Food insecurity - inability: Not on file  . Transportation  needs - medical: Not on file  . Transportation needs - non-medical: Not on file  Occupational History    Comment: housekeeping  Tobacco Use  . Smoking status: Former Games developermoker  . Smokeless tobacco: Former Engineer, waterUser  Substance and Sexual Activity  . Alcohol use: No  . Drug use: No  . Sexual activity: Not on file  Other Topics Concern  . Not on file  Social History Narrative  . Not on file    Past Surgical History:  Procedure Laterality Date  . CATARACT EXTRACTION    . EYE SURGERY    . KNEE SURGERY  2006  . NECK SURGERY  2012  . SPINE SURGERY  2009  . TONSILECTOMY, ADENOIDECTOMY, BILATERAL MYRINGOTOMY AND TUBES       . amLODipine  10 mg Oral Daily  . aspirin EC  81 mg Oral Daily  . atorvastatin  40 mg Oral q1800  . doxazosin  8 mg Oral QHS  . enoxaparin (LOVENOX) injection  40 mg Subcutaneous QHS  . ferrous sulfate  325 mg Oral Q breakfast  . insulin aspart  0-9 Units Subcutaneous TID WC  . irbesartan  150 mg Oral Daily  . pregabalin  100 mg Oral BID  . terbinafine  250 mg Oral Daily     Physical Exam: Blood pressure 130/67, pulse 73, temperature 98 F (36.7 C), temperature source Oral, resp. rate 14, height 5\' 7"  (1.702 m),  weight 221 lb 12.5 oz (100.6 kg), SpO2 97 %.   Affect appropriate Overweight black male  HEENT: normal Neck supple with no adenopathy JVP normal no bruits no thyromegaly Lungs clear with no wheezing and good diaphragmatic motion Heart:  S1/S2 no murmur, no rub, gallop or click PMI normal Abdomen: benighn, BS positve, no tenderness, no AAA no bruit.  No HSM or HJR Distal pulses intact with no bruits No edema Neuro moves LLE but weak  Skin warm and dry No muscular weakness   Labs:   Lab Results  Component Value Date   WBC 6.5 10/06/2017   HGB 9.2 (L) 10/06/2017   HCT 29.9 (L) 10/06/2017   MCV 80.2 10/06/2017   PLT 279 10/06/2017    Recent Labs  Lab 10/07/17 0440  NA 140  K 3.8  CL 108  CO2 22  BUN 13  CREATININE 1.01  CALCIUM  8.6*  GLUCOSE 109*   Lab Results  Component Value Date   CKTOTAL 261 (H) 08/22/2013   TROPONINI <0.03 10/07/2017    Lab Results  Component Value Date   CHOL 116 10/07/2017   CHOL 186 08/29/2012   CHOL 191 05/11/2011   Lab Results  Component Value Date   HDL 31 (L) 10/07/2017   HDL 38 (L) 08/29/2012   HDL 44 05/11/2011   Lab Results  Component Value Date   LDLCALC 74 10/07/2017   LDLCALC 116 (H) 08/29/2012   LDLCALC 132 (H) 05/11/2011   Lab Results  Component Value Date   TRIG 54 10/07/2017   TRIG 161 (H) 08/29/2012   TRIG 74 05/11/2011   Lab Results  Component Value Date   CHOLHDL 3.7 10/07/2017   CHOLHDL 4.9 08/29/2012   CHOLHDL 4.3 05/11/2011   No results found for: LDLDIRECT    Radiology: CTA no PE  CXR NAD  EKG NSR normal ECG    ASSESSMENT AND PLAN:  1. Syncope:  More weakness and dizziness no evidence of cardiac issues Normal telemetry , ECG, R.O normal exam Preliminary echo with EF 65-70% 2. Chest Pain: with 3 vessel coronary calcium on CT will arrange lexiscan myovue at [facility] in am but doubt he will have large area of ischemia 3. Anemia: likely contributing to weakness and dyspnea w/u primary service 4. DM:  Discussed low carb diet.  Target hemoglobin A1c is 6.5 or less.  Continue current medications. 5. HTN:  Well controlled.  Continue current medications and low sodium Dash type diet.     SignedCharlton Haws 10/07/2017, 9:43 AM

## 2017-10-07 NOTE — Progress Notes (Addendum)
[facility].  Pt made aware of transport and order to be NPO at [facility].

## 2017-10-08 ENCOUNTER — Observation Stay (HOSPITAL_BASED_OUTPATIENT_CLINIC_OR_DEPARTMENT_OTHER)
Admit: 2017-10-08 | Discharge: 2017-10-08 | Disposition: A | Discharge status: Home / Self Care | Payer: Medicare Other | Attending: Cardiovascular Disease | Admitting: Cardiovascular Disease

## 2017-10-08 ENCOUNTER — Observation Stay (HOSPITAL_COMMUNITY): Payer: Medicare Other

## 2017-10-08 DIAGNOSIS — R079 Chest pain, unspecified: Secondary | ICD-10-CM | POA: Diagnosis not present

## 2017-10-08 DIAGNOSIS — R55 Syncope and collapse: Secondary | ICD-10-CM

## 2017-10-08 DIAGNOSIS — T671XXS Heat syncope, sequela: Secondary | ICD-10-CM

## 2017-10-08 DIAGNOSIS — W19XXXA Unspecified fall, initial encounter: Secondary | ICD-10-CM | POA: Diagnosis not present

## 2017-10-08 DIAGNOSIS — G8194 Hemiplegia, unspecified affecting left nondominant side: Secondary | ICD-10-CM | POA: Diagnosis not present

## 2017-10-08 LAB — NM MYOCAR MULTI W/SPECT W/WALL MOTION / EF
CSEPEDS: 48 s
CSEPPHR: 105 {beats}/min
Estimated workload: 1 METS
Exercise duration (min): 5 min
LV dias vol: 93 mL (ref 62–150)
LV sys vol: 35 mL
MPHR: 146 {beats}/min
NUC STRESS TID: 1.21
Percent HR: 71 %
Rest HR: 72 {beats}/min

## 2017-10-08 LAB — COMPREHENSIVE METABOLIC PANEL
ALK PHOS: 56 U/L (ref 38–126)
ALT: 10 U/L — ABNORMAL LOW (ref 17–63)
ANION GAP: 7 (ref 5–15)
AST: 14 U/L — ABNORMAL LOW (ref 15–41)
Albumin: 2.9 g/dL — ABNORMAL LOW (ref 3.5–5.0)
BILIRUBIN TOTAL: 0.4 mg/dL (ref 0.3–1.2)
BUN: 19 mg/dL (ref 6–20)
CO2: 24 mmol/L (ref 22–32)
Calcium: 8.5 mg/dL — ABNORMAL LOW (ref 8.9–10.3)
Chloride: 108 mmol/L (ref 101–111)
Creatinine, Ser: 1.31 mg/dL — ABNORMAL HIGH (ref 0.61–1.24)
GFR, EST NON AFRICAN AMERICAN: 52 mL/min — AB (ref >60)
GLUCOSE: 89 mg/dL (ref 65–99)
Potassium: 4 mmol/L (ref 3.5–5.1)
Sodium: 139 mmol/L (ref 135–145)
TOTAL PROTEIN: 6.2 g/dL — AB (ref 6.5–8.1)

## 2017-10-08 LAB — CBC
HEMATOCRIT: 26.2 % — AB (ref 39.0–52.0)
HEMOGLOBIN: 8.2 g/dL — AB (ref 13.0–17.0)
MCH: 25.2 pg — ABNORMAL LOW (ref 26.0–34.0)
MCHC: 31.3 g/dL (ref 30.0–36.0)
MCV: 80.6 fL (ref 78.0–100.0)
Platelets: 251 10*3/uL (ref 150–400)
RBC: 3.25 MIL/uL — ABNORMAL LOW (ref 4.22–5.81)
RDW: 16.1 % — AB (ref 11.5–15.5)
WBC: 5 10*3/uL (ref 4.0–10.5)

## 2017-10-08 LAB — GLUCOSE, CAPILLARY
GLUCOSE-CAPILLARY: 110 mg/dL — AB (ref 65–99)
GLUCOSE-CAPILLARY: 108 mg/dL — AB (ref 65–99)
Glucose-Capillary: 98 mg/dL (ref 65–99)

## 2017-10-08 LAB — MAGNESIUM: MAGNESIUM: 1.8 mg/dL (ref 1.7–2.4)

## 2017-10-08 MED ORDER — REGADENOSON 0.4 MG/5ML IV SOLN
INTRAVENOUS | Status: AC
  Filled 2017-10-08: qty 5

## 2017-10-08 MED ORDER — TECHNETIUM TC 99M TETROFOSMIN IV KIT
30.0000 | PACK | Freq: Once | INTRAVENOUS | Status: AC | PRN
  Administered 2017-10-08: 30 via INTRAVENOUS

## 2017-10-08 MED ORDER — REGADENOSON 0.4 MG/5ML IV SOLN
0.4000 mg | Freq: Once | INTRAVENOUS | Status: AC
  Administered 2017-10-08: 0.4 mg via INTRAVENOUS
  Filled 2017-10-08: qty 5

## 2017-10-08 MED ORDER — TECHNETIUM TC 99M TETROFOSMIN IV KIT
10.0000 | PACK | Freq: Once | INTRAVENOUS | Status: AC | PRN
  Administered 2017-10-08: 10 via INTRAVENOUS

## 2017-10-08 NOTE — Progress Notes (Signed)
PROGRESS NOTE    Luis Poole  ZOX:[id number] DOB: 02-27-1941 DOA: 10/06/2017 PCP: System, Pcp Not In   Brief Narrative: Luis Poole a 77 y.o.malewith medical history significantfor CVAresidual left lower extremity weakness, DM withneuropathy,gout,who presented to the ED with complaints of dizziness with subsequent fall twice lastnightat about 3- 4AM.Patient reports he was using the bedside commode,urinating whendizzinessstarted,was about to get up to use his wheelchair when he fell due to dizziness.He reports the second time he fell, he was also using the bedside commode. He reports he "blacked out", but did not pass out completely or hit his head.  Denies any difficulty with urination or straining, novomitingordiarrhea,maintaining good p.o. intake. Ptreports first episode of chest pain was later in the dayabout 12 noon,he was sitting down watching TV,when he suddenly had substernal sharp chest pains,nonradiating,without associated shortness of breath,diaphoresis,palpitations,nausea or vomiting.Pain lasted about 30-43minutes. Patient takes a daily aspirin and Lipitor.Patient reports he quit smoking cigarettesin the 1960s, but uses tobacco- 1pack per week.He reports left hip pain from fall  Patient admitted for further evaluation.  Patient did not have any further syncopal episodes after presentation.  He denied any chest pain. Patient was evaluated by cardiology and he underwent nuclear stress test which was negative for ischemia. Patient also underwent MRI of the brain which did not show any acute intracranial abnormalities. Patient evaluated by physical therapist and recommended home health PT on discharge.  Assessment & Plan:   Principal Problem:   Syncope Active Problems:   Left hemiparesis (HCC)   Hypertension   Diabetes mellitus type 2 in nonobese (HCC)   Chronic diastolic heart failure (HCC)   Chest pain   Syncope with chest  pain: Currently chest pain free. High risk forCAD- DM, HTN, CVA hx. CTA chest suggesting 3 vessel Artherosclerosis. Trop X1 - negative, EKG-prolonged QTC.Patient not orthostatic.No fluid loss history,hemoglobin stable 9.2.  Story a bit unclear.  He denied CP. - Underwent Nuclear stress test today.negative for ischemia. Cardiology cleared for discharge. - Troponins negative - EKG  without RBBB, no ST-T wave changes concerning for ischemia (prior EKG with RBBB, prolonged QTc) -Echocardiogram notable for EF 65-70%, grade 1 diastolic dysfunction (see report) -Carotid Dopplers with 1-39% stenosis bilaterally, patent vertebral arteries - 325mg  Aspirin given in ED, cont 81mg  daily - Lipitor, increased dose to 40mg  daily,  - Lipid panel notable for low HDL  Vertigo: Resolved.  MRI brain did not show any acute intracranial abnormalities.  HTN: Cont home norvasc, ARBS  Prolong QTC- 501 at presentation. Not onQTC prolonging medications. Repeat  EKG  with improved QT  Mild hypokalemia: Supplemented   Fall: CT cervical and head, hip xray neg. PT evaluation done and recommended home health History of CVA in the past and has left residual weakness.  Chronic anemia: Stable. At baseline.    DVT prophylaxis: Lovenox Code Status: Full Family Communication: None  Disposition Plan: Home with Home Health   Consultants: Cardiology  Procedures: Nuclear stress test  Antimicrobials:None Subjective: Patient Seen and examined at the bed side. Remains comfortable. Underwent nuclear stress test today which came out to be negative for ischemia.  Objective: Vitals:   10/08/17 0958 10/08/17 1000 10/08/17 1002 10/08/17 1611  BP: 134/79 129/76 135/74 128/82  Pulse:    76  Resp:    18  Temp:    98.2 F (36.8 C)  TempSrc:    Oral  SpO2:    97%  Weight:      Height:  Intake/Output Summary (Last 24 hours) at 10/08/2017 1728 Last data filed at 10/08/2017 1400 Gross per 24  hour  Intake 600 ml  Output 1300 ml  Net -700 ml   Filed Weights   10/06/17 2233  Weight: 100.6 kg (221 lb 12.5 oz)    Examination:  General exam: Appears calm and comfortable ,Not in distress,obese HEENT:PERRL,Oral mucosa moist, Ear/Nose normal on gross exam Respiratory system: Bilateral equal air entry, normal vesicular breath sounds, no wheezes or crackles  Cardiovascular system: S1 & S2 heard, RRR. No JVD, murmurs, rubs, gallops or clicks. No pedal edema. Gastrointestinal system: Abdomen is nondistended, soft and nontender. No organomegaly or masses felt. Normal bowel sounds heard. Central nervous system: Alert and oriented. Weakness with Dorsiflexion/plantar flexion on the left Extremities: No edema, no clubbing ,no cyanosis, distal peripheral pulses palpable. Skin: No rashes, lesions or ulcers,no icterus ,no pallor MSK: Normal muscle bulk,tone ,power Psychiatry: Judgement and insight appear normal. Mood & affect appropriate.     Data Reviewed: I have personally reviewed following labs and imaging studies  CBC: Recent Labs  Lab 10/06/17 1639 10/08/17 0425  WBC 6.5 5.0  HGB 9.2* 8.2*  HCT 29.9* 26.2*  MCV 80.2 80.6  PLT 279 251   Basic Metabolic Panel: Recent Labs  Lab 10/06/17 1451 10/07/17 0440 10/08/17 0425  NA 143 140 139  K 3.4* 3.8 4.0  CL 109 108 108  CO2 23 22 24   GLUCOSE 122* 109* 89  BUN 14 13 19   CREATININE 1.33* 1.01 1.31*  CALCIUM 8.9 8.6* 8.5*  MG  --   --  1.8   GFR: Estimated Creatinine Clearance: 55.9 mL/min (A) (by C-G formula based on SCr of 1.31 mg/dL (H)). Liver Function Tests: Recent Labs  Lab 10/08/17 0425  AST 14*  ALT 10*  ALKPHOS 56  BILITOT 0.4  PROT 6.2*  ALBUMIN 2.9*   No results for input(s): LIPASE, AMYLASE in the last 168 hours. No results for input(s): AMMONIA in the last 168 hours. Coagulation Profile: No results for input(s): INR, PROTIME in the last 168 hours. Cardiac Enzymes: Recent Labs  Lab  10/06/17 2306 10/07/17 0440  TROPONINI <0.03 <0.03   BNP (last 3 results) No results for input(s): PROBNP in the last 8760 hours. HbA1C: No results for input(s): HGBA1C in the last 72 hours. CBG: Recent Labs  Lab 10/07/17 1153 10/07/17 1658 10/07/17 2140 10/08/17 1205 10/08/17 1650  GLUCAP 98 106* 93 110* 98   Lipid Profile: Recent Labs    10/07/17 0440  CHOL 116  HDL 31*  LDLCALC 74  TRIG 54  CHOLHDL 3.7   Thyroid Function Tests: No results for input(s): TSH, T4TOTAL, FREET4, T3FREE, THYROIDAB in the last 72 hours. Anemia Panel: Recent Labs    10/07/17 0440  FERRITIN 41  TIBC 234*  IRON 30*   Sepsis Labs: No results for input(s): PROCALCITON, LATICACIDVEN in the last 168 hours.  No results found for this or any previous visit (from the past 240 hour(s)).       Radiology Studies:       Scheduled Meds: . amLODipine  10 mg Oral Daily  . aspirin EC  81 mg Oral Daily  . atorvastatin  40 mg Oral q1800  . doxazosin  8 mg Oral QHS  . enoxaparin (LOVENOX) injection  40 mg Subcutaneous QHS  . ferrous sulfate  325 mg Oral Q breakfast  . insulin aspart  0-9 Units Subcutaneous TID WC  . irbesartan  150 mg Oral Daily  .  pregabalin  100 mg Oral BID  . terbinafine  250 mg Oral Daily   Continuous Infusions:   LOS: 0 days    Time spent: 25 minutes.More than 50% of that time was spent in counseling and/or coordination of care.      Meredith LeedsAmrit Saad Buhl BK, MD [facility] Pager [phone]  If 7PM-7AM, please contact night-coverage [email or web address] Password [email or web address] 10/08/2017, 5:28 PM

## 2017-10-08 NOTE — Progress Notes (Signed)
Patient presented for Lexiscan. Tolerated procedure well. Pending final stress imaging result.  

## 2017-10-08 NOTE — Progress Notes (Signed)
nuc study was low risk.  Normal study.
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
## 2017-10-09 DIAGNOSIS — W19XXXA Unspecified fall, initial encounter: Secondary | ICD-10-CM | POA: Diagnosis not present

## 2017-10-09 DIAGNOSIS — G8194 Hemiplegia, unspecified affecting left nondominant side: Secondary | ICD-10-CM

## 2017-10-09 DIAGNOSIS — R079 Chest pain, unspecified: Secondary | ICD-10-CM | POA: Diagnosis not present

## 2017-10-09 DIAGNOSIS — R55 Syncope and collapse: Secondary | ICD-10-CM | POA: Diagnosis not present

## 2017-10-09 LAB — BASIC METABOLIC PANEL
ANION GAP: 9 (ref 5–15)
BUN: 19 mg/dL (ref 6–20)
CALCIUM: 8.5 mg/dL — AB (ref 8.9–10.3)
CHLORIDE: 105 mmol/L (ref 101–111)
CO2: 23 mmol/L (ref 22–32)
CREATININE: 1.24 mg/dL (ref 0.61–1.24)
GFR calc non Af Amer: 56 mL/min — ABNORMAL LOW (ref >60)
Glucose, Bld: 98 mg/dL (ref 65–99)
Potassium: 4.2 mmol/L (ref 3.5–5.1)
SODIUM: 137 mmol/L (ref 135–145)

## 2017-10-09 LAB — GLUCOSE, CAPILLARY
GLUCOSE-CAPILLARY: 90 mg/dL (ref 65–99)
GLUCOSE-CAPILLARY: 99 mg/dL (ref 65–99)

## 2017-10-09 MED ORDER — ATORVASTATIN CALCIUM 20 MG PO TABS: 20.0000 mg | ORAL_TABLET | Freq: Every day | ORAL | 0 refills | Status: DC

## 2017-10-09 NOTE — Care Management (Signed)
    Durable Medical Equipment  (From admission, onward)        Start     Ordered   10/09/17 1144  For home use only DME Hospital bed  Once    Question:  Bed type  Answer:  Semi-electric   10/09/17 1144

## 2017-10-09 NOTE — Care Management Obs Status (Signed)
MEDICARE OBSERVATION STATUS NOTIFICATION   Patient Details  Name: Luis OdeaRobert P Palmisano MRN: [id number] Date of Birth: 07/10/1941   Medicare Observation Status Notification Given:  Yes    Geni BersMcGibboney, Etha Stambaugh, RN 10/09/2017, 12:18 PM
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
## 2017-10-09 NOTE — Progress Notes (Signed)
Progress Note  Patient Name: Luis Poole Date of Encounter: 10/09/2017  Primary Cardiologist: Charlton HawsPeter Thurma Priego, MD   Subjective   No chest pain, no SOB no lightheadedness.  Sitting up in chair with plan for discharge today  Inpatient Medications    Scheduled Meds: . amLODipine  10 mg Oral Daily  . aspirin EC  81 mg Oral Daily  . atorvastatin  40 mg Oral q1800  . doxazosin  8 mg Oral QHS  . enoxaparin (LOVENOX) injection  40 mg Subcutaneous QHS  . ferrous sulfate  325 mg Oral Q breakfast  . insulin aspart  0-9 Units Subcutaneous TID WC  . irbesartan  150 mg Oral Daily  . pregabalin  100 mg Oral BID  . terbinafine  250 mg Oral Daily   Continuous Infusions:  PRN Meds: HYDROcodone-acetaminophen   Vital Signs    Vitals:   10/08/17 1611 10/08/17 2124 10/09/17 0551 10/09/17 0854  BP: 128/82 120/71 (!) 145/84 (!) 121/56  Pulse: 76 72 66 72  Resp: 18 20 20 18   Temp: 98.2 F (36.8 C) 98.4 F (36.9 C) 98.4 F (36.9 C) 98.4 F (36.9 C)  TempSrc: Oral Oral Oral Oral  SpO2: 97% 99% 94% 98%  Weight:      Height:        Intake/Output Summary (Last 24 hours) at 10/09/2017 1018 Last data filed at 10/09/2017 0601 Gross per 24 hour  Intake 720 ml  Output 1350 ml  Net -630 ml   Filed Weights   10/06/17 2233  Weight: 221 lb 12.5 oz (100.6 kg)    Telemetry    SR - Personally Reviewed  ECG    No new - Personally Reviewed  Physical Exam   GEN: No acute distress.   Neck: No JVD Cardiac: RRR, no murmurs, rubs, or gallops.  Respiratory: Clear to auscultation bilaterally. GI: Soft, nontender, non-distended  MS: tr to 1+ edema lower ext, tender on Lt leg.; No deformity. Neuro:  Nonfocal  Psych: Normal affect   Labs    Chemistry Recent Labs  Lab 10/07/17 0440 10/08/17 0425 10/09/17 0415  NA 140 139 137  K 3.8 4.0 4.2  CL 108 108 105  CO2 22 24 23   GLUCOSE 109* 89 98  BUN 13 19 19   CREATININE 1.01 1.31* 1.24  CALCIUM 8.6* 8.5* 8.5*  PROT  --  6.2*  --     ALBUMIN  --  2.9*  --   AST  --  14*  --   ALT  --  10*  --   ALKPHOS  --  56  --   BILITOT  --  0.4  --   GFRNONAA >60 52* 56*  GFRAA >60 >60 >60  ANIONGAP 10 7 9      Hematology Recent Labs  Lab 10/06/17 1639 10/08/17 0425  WBC 6.5 5.0  RBC 3.73* 3.25*  HGB 9.2* 8.2*  HCT 29.9* 26.2*  MCV 80.2 80.6  MCH 24.7* 25.2*  MCHC 30.8 31.3  RDW 15.6* 16.1*  PLT 279 251    Cardiac Enzymes Recent Labs  Lab 10/06/17 2306 10/07/17 0440  TROPONINI <0.03 <0.03    Recent Labs  Lab 10/06/17 1649  TROPIPOC 0.00     BNP Recent Labs  Lab 10/06/17 1639  BNP 32.9     DDimer No results for input(s): DDIMER in the last 168 hours.   Radiology    Mr Brain Wo Contrast  Result Date: 10/08/2017 CLINICAL DATA:  Syncope.  History  of stroke. EXAM: MRI HEAD WITHOUT CONTRAST TECHNIQUE: Multiplanar, multiecho pulse sequences of the brain and surrounding structures were obtained without intravenous contrast. COMPARISON:  CT 10/06/2017 FINDINGS: Brain: Image quality degraded by motion. Negative for acute infarct. Chronic slightly hemorrhagic infarct distal right anterior cerebral artery territory unchanged from CT. Mild chronic microvascular ischemic change in the white matter. Brainstem normal. Ventricle size normal. Negative for mass lesion. Vascular: Normal arterial flow voids. Skull and upper cervical spine: Negative Sinuses/Orbits: Left lens replacement. No orbital mass. Paranasal sinuses show mild mucosal edema. Left mastoid sinus effusion. Other: None IMPRESSION: Negative for acute infarct Chronic infarct distal right anterior cerebral artery territory. Electronically Signed   By: Marlan Palau M.D.   On: 10/08/2017 07:50   Nm Myocar Multi W/spect W/wall Motion / Ef  Result Date: 10/08/2017  T wave inversion was noted during stress in the II, III and aVF leads.  There was no ST segment deviation noted during stress.  The study is normal.  This is a low risk study.  The left  ventricular ejection fraction is normal (55-65%).     Cardiac Studies   Echo 10/07/17 ------------------------------------------------------------------- Study Conclusions  - Left ventricle: The cavity size was normal. There was moderate concentric hypertrophy. Systolic function was vigorous. The estimated ejection fraction was in the range of 65% to 70%. There was dynamic obstruction at rest, with a peak velocity of 237 cm/sec and a peak gradient of 22 mm Hg. Wall motion was normal; there were no regional wall motion abnormalities. Doppler parameters are consistent with abnormal left ventricular relaxation (grade 1 diastolic dysfunction). Doppler parameters are consistent with indeterminate ventricular filling pressure. - Aortic valve: Transvalvular velocity was within the normal range. There was no stenosis. There was no regurgitation. - Mitral valve: Mildly calcified annulus. Transvalvular velocity was within the normal range. There was no evidence for stenosis. There was mild regurgitation. - Right ventricle: The cavity size was normal. Wall thickness was normal. Systolic function was normal. - Atrial septum: No defect or patent foramen ovale was identified. - Tricuspid valve: There was trivial regurgitation.     Patient Profile     77 y.o. male with hx of stroke with residual LLE weakness and poor balance, DM with neuropathy, chronic diastolic CHF,  HTN and CKD presented with syncope.   CTA negative for PE and mentions 3 vessel coronary calcific atherosclerosis    Assessment & Plan    Syncope  EF 65-70% G1DD, intermittent RBBB --no further episodes  3V coronary calcification on CTA --neg nuc study yesterday,  Did have intermittent RBBB during stress test.   HTN controlled  Prolonged Qtc on admit now resolved.    Anemia with Hgb 8.2    For questions or updates, please contact [facility] Please consult [email or web address] for contact  info under Cardiology/STEMI.      Signed, Charlton Haws, MD  10/09/2017, 10:18 AM    Patient examined chart reviewed. Sitting in chair no distress lungs clear no murmur LLE weakness from old stroke. Has had normal echo and myovue with EF 65-70% Telemetry with NSR no evidence of arrhythmia Suspect non cardiac dizziness Ok to d/c home No further cardiac w/u needed   [employer]

## 2017-10-09 NOTE — Progress Notes (Addendum)
Progress Note  Patient Name: Luis Poole Date of Encounter: 10/09/2017  Primary Cardiologist: Charlton Haws, MD   Subjective   No chest pain, no SOB no lightheadedness.  Sitting up in chair with plan for discharge today  Inpatient Medications    Scheduled Meds: . amLODipine  10 mg Oral Daily  . aspirin EC  81 mg Oral Daily  . atorvastatin  40 mg Oral q1800  . doxazosin  8 mg Oral QHS  . enoxaparin (LOVENOX) injection  40 mg Subcutaneous QHS  . ferrous sulfate  325 mg Oral Q breakfast  . insulin aspart  0-9 Units Subcutaneous TID WC  . irbesartan  150 mg Oral Daily  . pregabalin  100 mg Oral BID  . terbinafine  250 mg Oral Daily   Continuous Infusions:  PRN Meds: HYDROcodone-acetaminophen   Vital Signs    Vitals:   10/08/17 1611 10/08/17 2124 10/09/17 0551 10/09/17 0854  BP: 128/82 120/71 (!) 145/84 (!) 121/56  Pulse: 76 72 66 72  Resp: 18 20 20 18   Temp: 98.2 F (36.8 C) 98.4 F (36.9 C) 98.4 F (36.9 C) 98.4 F (36.9 C)  TempSrc: Oral Oral Oral Oral  SpO2: 97% 99% 94% 98%  Weight:      Height:        Intake/Output Summary (Last 24 hours) at 10/09/2017 0946 Last data filed at 10/09/2017 0601 Gross per 24 hour  Intake 840 ml  Output 1350 ml  Net -510 ml   Filed Weights   10/06/17 2233  Weight: 221 lb 12.5 oz (100.6 kg)    Telemetry    SR - Personally Reviewed  ECG    No new - Personally Reviewed  Physical Exam   GEN: No acute distress.   Neck: No JVD Cardiac: RRR, no murmurs, rubs, or gallops.  Respiratory: Clear to auscultation bilaterally. GI: Soft, nontender, non-distended  MS: tr to 1+ edema lower ext, tender on Lt leg.; No deformity. Neuro:  Nonfocal  Psych: Normal affect   Labs    Chemistry Recent Labs  Lab 10/07/17 0440 10/08/17 0425 10/09/17 0415  NA 140 139 137  K 3.8 4.0 4.2  CL 108 108 105  CO2 22 24 23   GLUCOSE 109* 89 98  BUN 13 19 19   CREATININE 1.01 1.31* 1.24  CALCIUM 8.6* 8.5* 8.5*  PROT  --  6.2*  --     ALBUMIN  --  2.9*  --   AST  --  14*  --   ALT  --  10*  --   ALKPHOS  --  56  --   BILITOT  --  0.4  --   GFRNONAA >60 52* 56*  GFRAA >60 >60 >60  ANIONGAP 10 7 9      Hematology Recent Labs  Lab 10/06/17 1639 10/08/17 0425  WBC 6.5 5.0  RBC 3.73* 3.25*  HGB 9.2* 8.2*  HCT 29.9* 26.2*  MCV 80.2 80.6  MCH 24.7* 25.2*  MCHC 30.8 31.3  RDW 15.6* 16.1*  PLT 279 251    Cardiac Enzymes Recent Labs  Lab 10/06/17 2306 10/07/17 0440  TROPONINI <0.03 <0.03    Recent Labs  Lab 10/06/17 1649  TROPIPOC 0.00     BNP Recent Labs  Lab 10/06/17 1639  BNP 32.9     DDimer No results for input(s): DDIMER in the last 168 hours.   Radiology    Mr Brain Wo Contrast  Result Date: 10/08/2017 CLINICAL DATA:  Syncope.  History  of stroke. EXAM: MRI HEAD WITHOUT CONTRAST TECHNIQUE: Multiplanar, multiecho pulse sequences of the brain and surrounding structures were obtained without intravenous contrast. COMPARISON:  CT 10/06/2017 FINDINGS: Brain: Image quality degraded by motion. Negative for acute infarct. Chronic slightly hemorrhagic infarct distal right anterior cerebral artery territory unchanged from CT. Mild chronic microvascular ischemic change in the white matter. Brainstem normal. Ventricle size normal. Negative for mass lesion. Vascular: Normal arterial flow voids. Skull and upper cervical spine: Negative Sinuses/Orbits: Left lens replacement. No orbital mass. Paranasal sinuses show mild mucosal edema. Left mastoid sinus effusion. Other: None IMPRESSION: Negative for acute infarct Chronic infarct distal right anterior cerebral artery territory. Electronically Signed   By: Marlan Palauharles  Clark M.D.   On: 10/08/2017 07:50   Nm Myocar Multi W/spect W/wall Motion / Ef  Result Date: 10/08/2017  T wave inversion was noted during stress in the II, III and aVF leads.  There was no ST segment deviation noted during stress.  The study is normal.  This is a low risk study.  The left  ventricular ejection fraction is normal (55-65%).     Cardiac Studies   Echo 10/07/17 ------------------------------------------------------------------- Study Conclusions  - Left ventricle: The cavity size was normal. There was moderate concentric hypertrophy. Systolic function was vigorous. The estimated ejection fraction was in the range of 65% to 70%. There was dynamic obstruction at rest, with a peak velocity of 237 cm/sec and a peak gradient of 22 mm Hg. Wall motion was normal; there were no regional wall motion abnormalities. Doppler parameters are consistent with abnormal left ventricular relaxation (grade 1 diastolic dysfunction). Doppler parameters are consistent with indeterminate ventricular filling pressure. - Aortic valve: Transvalvular velocity was within the normal range. There was no stenosis. There was no regurgitation. - Mitral valve: Mildly calcified annulus. Transvalvular velocity was within the normal range. There was no evidence for stenosis. There was mild regurgitation. - Right ventricle: The cavity size was normal. Wall thickness was normal. Systolic function was normal. - Atrial septum: No defect or patent foramen ovale was identified. - Tricuspid valve: There was trivial regurgitation.     Patient Profile     77 y.o. male with hx of stroke with residual LLE weakness and poor balance, DM with neuropathy, chronic diastolic CHF,  HTN and CKD presented with syncope.   CTA negative for PE and mentions 3 vessel coronary calcific atherosclerosis    Assessment & Plan    Syncope  EF 65-70% G1DD, intermittent RBBB --no further episodes  3V coronary calcification on CTA --neg nuc study yesterday,  Did have intermittent RBBB during stress test.   HTN controlled  Prolonged Qtc on admit now resolved.    Anemia with Hgb 8.2    For questions or updates, please contact [facility] Please consult [email or web address] for contact  info under Cardiology/STEMI.      Signed, Nada BoozerLaura Ingold, NP  10/09/2017, 9:46 AM    See my progress note from same day Exam with obese black male clear lungs no murmur LLE weakness Echo and myovue normal d/c home no need for cardiology f/u at this time  .Charlton HawsPeter Nishan

## 2017-10-10 NOTE — Progress Notes (Signed)
Went over discharge information with patient.  All questions answered. Explained importance of taking medications as prescribed.   All HH needs set up per Case Management.  VSS.  Pt discharged via wheelchair.

## 2017-10-16 NOTE — Progress Notes (Deleted)
Cardiology Office Note   Date:  10/16/2017   ID:  Dakwon, Wenberg 01-13-1941, MRN [id number]  PCP:  System, Pcp Not In  Cardiologist:   Charlton Haws, MD   No chief complaint on file.     History of Present Illness: Luis Poole is a 77 y.o. male who presents for post hospital f/u. Admitted with fall and dizziness labeled as syncope. He's had CVA with residual LLE weakness, DM, Neuropathy Fell transferring to wheel chair. Atypical chest pain CT/MRI no acute stroke or bleed Telemetry normal   Echo reviewed 10/07/16 EF 65-70% mild MR Myovue reviewed 10/08/17 Normal no ishcemia EF 55-65%  ***     Past Medical History:  Diagnosis Date  . Anemia, unspecified 06/08/2013  . Cataract   . CHF (congestive heart failure) (HCC)   . Chronic kidney disease   . Diabetes mellitus   . Hypertension   . S/P lumbar fusion   . Stroke [facility])     Past Surgical History:  Procedure Laterality Date  . CATARACT EXTRACTION    . EYE SURGERY    . KNEE SURGERY  2006  . NECK SURGERY  2012  . SPINE SURGERY  2009  . TONSILECTOMY, ADENOIDECTOMY, BILATERAL MYRINGOTOMY AND TUBES       Current Outpatient Medications  Medication Sig Dispense Refill  . acetaminophen (TYLENOL) 650 MG CR tablet Take 1 tablet (650 mg total) by mouth 2 (two) times daily. 30 tablet 0  . amLODipine (NORVASC) 10 MG tablet Take 10 mg by mouth daily.     Marland Kitchen aspirin EC 81 MG tablet Take 81 mg by mouth daily.    Marland Kitchen atorvastatin (LIPITOR) 20 MG tablet Take 1 tablet (20 mg total) by mouth daily. 30 tablet 0  . colchicine 0.6 MG tablet Take 1 tablet (0.6 mg total) daily by mouth. (Patient taking differently: Take 0.6 mg by mouth daily as needed (gout pain). ) 30 tablet 0  . doxazosin (CARDURA) 8 MG tablet Take 8 mg by mouth at bedtime.     . ferrous sulfate 325 (65 FE) MG tablet Take 325 mg by mouth daily with breakfast.    . furosemide (LASIX) 20 MG tablet Take 1 tablet (20 mg total) by mouth daily. 3 tablet 0  .  HYDROcodone-acetaminophen (NORCO) 7.5-325 MG tablet Take 1 tablet by mouth every 6 (six) hours as needed for moderate pain. 15 tablet 0  . ibuprofen (ADVIL,MOTRIN) 600 MG tablet Take 1 tablet (600 mg total) by mouth every 6 (six) hours as needed. 20 tablet 0  . pregabalin (LYRICA) 100 MG capsule Take 100 mg by mouth 2 (two) times daily.    Marland Kitchen terbinafine (LAMISIL) 250 MG tablet Take 250 mg by mouth daily.  0  . valsartan (DIOVAN) 160 MG tablet Take 160 mg daily by mouth.     No current facility-administered medications for this visit.     Allergies:   Lisinopril and Gabapentin    Social History:  The patient  reports that he has quit smoking. He has quit using smokeless tobacco. He reports that he does not drink alcohol or use drugs.   Family History:  The patient's family history includes Stroke in his mother.    ROS:  Please see the history of present illness.   Otherwise, review of systems are positive for none.   All other systems are reviewed and negative.    PHYSICAL EXAM: VS:  There were no vitals taken for this visit. ,  BMI There is no height or weight on file to calculate BMI. Affect appropriate Chronically ill black male  HEENT: normal Neck supple with no adenopathy JVP normal no bruits no thyromegaly Lungs clear with no wheezing and good diaphragmatic motion Heart:  S1/S2 no murmur, no rub, gallop or click PMI normal Abdomen: benighn, BS positve, no tenderness, no AAA no bruit.  No HSM or HJR Distal pulses intact with no bruits No edema Neuro LLE weakness  Skin warm and dry No muscular weakness    EKG:  SR RBBB nonspecific ST changes    Recent Labs: 10/06/2017: B Natriuretic Peptide 32.9 10/08/2017: ALT 10; Hemoglobin 8.2; Magnesium 1.8; Platelets 251 10/09/2017: BUN 19; Creatinine, Ser 1.24; Potassium 4.2; Sodium 137    Lipid Panel    Component Value Date/Time   CHOL 116 10/07/2017 0440   TRIG 54 10/07/2017 0440   HDL 31 (L) 10/07/2017 0440   CHOLHDL 3.7  10/07/2017 0440   VLDL 11 10/07/2017 0440   LDLCALC 74 10/07/2017 0440      Wt Readings from Last 3 Encounters:  10/06/17 221 lb 12.5 oz (100.6 kg)  06/28/17 214 lb 8.1 oz (97.3 kg)  06/28/17 214 lb (97.1 kg)      Other studies Reviewed: Additional studies/ records that were reviewed today include: Notes from hospital admission 09/2017 CT/MRI Echo Myovue labs and d/c summary .    ASSESSMENT AND PLAN:  1.  Syncope:  Non cardiac negative work up no further testing needed 2. CVA may need more PT/OT transfer skills f/u primary 3. HTN;  Well controlled.  Continue current medications and low sodium Dash type diet.   4. HLD:  Continue statin    Current medicines are reviewed at length with the patient today.  The patient does not have concerns regarding medicines.  The following changes have been made:  no change  Labs/ tests ordered today include: None No orders of the defined types were placed in this encounter.    Disposition:   FU with cardiology PRN      Signed, Charlton HawsPeter Abou Sterkel, MD  10/16/2017 8:21 AM    [facility] [address], [place], [place]  [address] Phone: [phone]; Fax: [phone]

## 2017-10-21 ENCOUNTER — Ambulatory Visit: Payer: Medicare Other | Admitting: Cardiovascular Disease

## 2017-10-21 DIAGNOSIS — R0989 Other specified symptoms and signs involving the circulatory and respiratory systems: Secondary | ICD-10-CM

## 2017-10-23 NOTE — Progress Notes (Signed)
Cardiology Office Note   Date:  10/24/2017   ID:  Spence, Soberano Dec 01, 1940, MRN [id number]  PCP:  System, Pcp Not In  Cardiologist:   Charlton Haws, MD   No chief complaint on file.     History of Present Illness: ANNA LIVERS is a 77 y.o. male who presents for post hospital f/u . Admitted [facility] 10/08/17 with fall ? Pre syncope. History of stroke with residual LLE weakness and poor balance. Has DM with neuropathy , HTN, CKD Cr 1.24 HTN and diastolic CHF. R/O Telemetry with no arrhythmia CTA negative for PE commented on 3V coronary calcium .  Echo 10/07/17 reviewed EF 65-70% mild MR.  Carotids with plaque no stenosis Myovue reviewed and no ischemia EF 55-65% intermittent RBBB during study  His BS is poorly controlled he needs surgery on his left ankle or knee with Dr Victorino Dike. Clear to have this from cardiology but BS needs to be better controlled to heal well  No recurrent "syncopal" spells since d/c Balance is poor and mild postural symptoms     Past Medical History:  Diagnosis Date  . Anemia, unspecified 06/08/2013  . Cataract   . CHF (congestive heart failure) (HCC)   . Chronic kidney disease   . Diabetes mellitus   . Hypertension   . S/P lumbar fusion   . Stroke [facility])     Past Surgical History:  Procedure Laterality Date  . CATARACT EXTRACTION    . EYE SURGERY    . KNEE SURGERY  2006  . NECK SURGERY  2012  . SPINE SURGERY  2009  . TONSILECTOMY, ADENOIDECTOMY, BILATERAL MYRINGOTOMY AND TUBES       Current Outpatient Medications  Medication Sig Dispense Refill  . acetaminophen (TYLENOL) 650 MG CR tablet Take 1 tablet (650 mg total) by mouth 2 (two) times daily. 30 tablet 0  . amLODipine (NORVASC) 10 MG tablet Take 10 mg by mouth daily.     Marland Kitchen aspirin EC 81 MG tablet Take 81 mg by mouth daily.    Marland Kitchen atorvastatin (LIPITOR) 20 MG tablet Take 1 tablet (20 mg total) by mouth daily. 30 tablet 0  . colchicine 0.6 MG tablet Take 1 tablet (0.6 mg total) daily by mouth.  (Patient taking differently: Take 0.6 mg by mouth daily as needed (gout pain). ) 30 tablet 0  . doxazosin (CARDURA) 8 MG tablet Take 8 mg by mouth at bedtime.     . ferrous sulfate 325 (65 FE) MG tablet Take 325 mg by mouth daily with breakfast.    . furosemide (LASIX) 20 MG tablet Take 1 tablet (20 mg total) by mouth daily. 3 tablet 0  . HYDROcodone-acetaminophen (NORCO) 7.5-325 MG tablet Take 1 tablet by mouth every 6 (six) hours as needed for moderate pain. 15 tablet 0  . ibuprofen (ADVIL,MOTRIN) 600 MG tablet Take 1 tablet (600 mg total) by mouth every 6 (six) hours as needed. 20 tablet 0  . pregabalin (LYRICA) 100 MG capsule Take 100 mg by mouth 2 (two) times daily.    Marland Kitchen terbinafine (LAMISIL) 250 MG tablet Take 250 mg by mouth daily.  0  . valsartan (DIOVAN) 160 MG tablet Take 160 mg daily by mouth.     No current facility-administered medications for this visit.     Allergies:   Lisinopril and Gabapentin    Social History:  The patient  reports that he has quit smoking. He has quit using smokeless tobacco. He reports that he  does not drink alcohol or use drugs.   Family History:  The patient's family history includes Stroke in his mother.    ROS:  Please see the history of present illness.   Otherwise, review of systems are positive for none.   All other systems are reviewed and negative.    PHYSICAL EXAM: VS:  BP 140/78   Pulse 68   Ht 5\' 7"  (1.702 m)   Wt 220 lb (99.8 kg)   BMI 34.46 kg/m  , BMI Body mass index is 34.46 kg/m. Affect appropriate Chronically ill obese black male  HEENT: normal Neck supple with no adenopathy JVP normal no bruits no thyromegaly Lungs clear with no wheezing and good diaphragmatic motion Heart:  S1/S2 no murmur, no rub, gallop or click PMI normal Abdomen: benighn, BS positve, no tenderness, no AAA no bruit.  No HSM or HJR Distal pulses intact with no bruits Plus one bilateral  edema Neuro non-focal Skin warm and dry No muscular  weakness    EKG:  NSR no acute ST changes intermittent RBBB during stress test    Recent Labs: 10/06/2017: B Natriuretic Peptide 32.9 10/08/2017: ALT 10; Hemoglobin 8.2; Magnesium 1.8; Platelets 251 10/09/2017: BUN 19; Creatinine, Ser 1.24; Potassium 4.2; Sodium 137    Lipid Panel    Component Value Date/Time   CHOL 116 10/07/2017 0440   TRIG 54 10/07/2017 0440   HDL 31 (L) 10/07/2017 0440   CHOLHDL 3.7 10/07/2017 0440   VLDL 11 10/07/2017 0440   LDLCALC 74 10/07/2017 0440      Wt Readings from Last 3 Encounters:  10/24/17 220 lb (99.8 kg)  10/06/17 221 lb 12.5 oz (100.6 kg)  06/28/17 214 lb 8.1 oz (97.3 kg)      Other studies Reviewed: Additional studies/ records that were reviewed today include: Echo , myovue , carotid labs ECG CTA and hospital notes from admission 10/07/17.    ASSESSMENT AND PLAN:  1.  Syncope:  No cardiac related to poor balance and previous stroke Telemetry with no arrhymia Echo with normal EF  2. CAD: calcium seen on CT r/o normal myovue no further w/u 3. HTN:  Well controlled.  Continue current medications and low sodium Dash type diet.   4. DM:  Discussed low carb diet.  Target hemoglobin A1c is 6.5 or less.  Continue current medications. 5. HLD:  On statin at target with normal LFT;s  Lab Results  Component Value Date   LDLCALC 74 10/07/2017    Clear to have knee or ankle surgery from cardiac perspective   Current medicines are reviewed at length with the patient today.  The patient does not have concerns regarding medicines.  The following changes have been made:  no change  Labs/ tests ordered today include: None No orders of the defined types were placed in this encounter.    Disposition:   FU with cardiology PRN      Signed, Charlton HawsPeter Elasha Tess, MD  10/24/2017 3:42 PM    [facility] [address], [place], [place]  [address] Phone: [phone]; Fax: [phone]

## 2017-10-24 ENCOUNTER — Encounter: Payer: Self-pay | Admitting: Cardiovascular Disease

## 2017-10-24 ENCOUNTER — Ambulatory Visit: Payer: Medicare Other | Admitting: Cardiovascular Disease

## 2017-10-24 VITALS — BP 140/78 | HR 68 | Ht 67.0 in | Wt 220.0 lb

## 2017-10-24 DIAGNOSIS — R55 Syncope and collapse: Secondary | ICD-10-CM | POA: Diagnosis not present

## 2017-10-24 NOTE — Patient Instructions (Signed)
Medication Instructions:  Your physician recommends that you continue on your current medications as directed. Please refer to the Current Medication list given to you today.  Labwork: NONE  Testing/Procedures: NONE  Follow-Up: Your physician wants you to follow-up as needed with  Dr. Nishan.    If you need a refill on your cardiac medications before your next appointment, please call your pharmacy.    

## 2018-02-10 ENCOUNTER — Telehealth: Payer: Self-pay | Admitting: Cardiovascular Disease

## 2018-02-10 NOTE — Telephone Encounter (Signed)
New Message:          [facility] Pre-operative Risk Assessment    Request for surgical clearance:  1. What type of surgery is being performed? Ankle surgery  2. When is this surgery scheduled? TBD  3. What type of clearance is required (medical clearance vs. Pharmacy clearance to hold med vs. Both)?Both   4. Are there any medications that need to be held prior to surgery and how long?furosemide (LASIX) 20 MG tablet  5. Practice name and name of physician performing surgery? Dr. Hewitt/[facility]   6. What is your office phone number [phone]   7.   What is your office fax number [phone]  8.   Anesthesia type (None, local, MAC, general) ? General   Luis Poole 02/10/2018, 10:19 AM  _________________________________________________________________   (provider comments below)

## 2018-02-11 NOTE — Telephone Encounter (Signed)
   Primary Cardiologist: Charlton HawsPeter Nishan, MD  Chart reviewed as part of pre-operative protocol coverage. Patient was contacted 02/11/2018 in reference to pre-operative risk assessment for pending surgery as outlined below.  Antony OdeaRobert P Wiberg was last seen on 10/2017 by Dr. Eden EmmsNishan. Has h/o fall, stroke, DM, HTN, CKD III, HTN, chronic diastolic CHF, mild MR. Negative nuc 09/2017. Prior fall felt less syncope and more related to poor balance and prior stroke. At last OV, no specific cardiac f/u felt needed. Since that day, Antony OdeaRobert P Svehla has done well without cardiac symptoms - is able to complete 5 METS without chest pain or dyspnea. No palpitations or syncope. Therefore, based on ACC/AHA guidelines, the patient would be at acceptable risk for the planned procedure without further cardiovascular testing.   Intake requests to hold Lasix in prep for surgery; we do not typically hold non-blood thinner medicines - his echo in 09/2017 did report there was a dynamic obstruction at rest so I will reach out to Dr. Eden EmmsNishan for his final recs on the Lasix. Dr. Eden EmmsNishan - Please route response to P CV DIV PREOP (the pre-op pool). Thank you.  Laurann Montanaayna N Karizma Cheek, PA-C 02/11/2018, 3:23 PM

## 2018-02-12 NOTE — Telephone Encounter (Signed)
   Primary Cardiologist: Charlton HawsPeter Nishan, MD  Chart reviewed as part of pre-operative protocol coverage again today. As outlined below, based on ACC/AHA guidelines, the patient would be at acceptable risk for the planned procedure without further cardiovascular testing.   Intake requested to hold Lasix in prep for surgery; we do not typically hold non-blood thinner medicines so this will be deferred to the surgeon to decide - Dr. Eden EmmsNishan does feel it is OK to hold Lasix before surgery if needed.  I will route this recommendation to the requesting party via Epic fax function and remove from pre-op pool.  Please call with questions.  Laurann Montanaayna N Chynna Buerkle, PA-C 02/12/2018, 1:21 PM

## 2018-02-12 NOTE — Telephone Encounter (Signed)
Ok to hold lasix before surgery

## 2018-09-09 ENCOUNTER — Ambulatory Visit: Payer: Medicare HMO | Attending: Orthopedic Surgery | Admitting: Physical Therapy

## 2018-09-09 DIAGNOSIS — M25572 Pain in left ankle and joints of left foot: Secondary | ICD-10-CM | POA: Insufficient documentation

## 2018-09-09 DIAGNOSIS — R2689 Other abnormalities of gait and mobility: Secondary | ICD-10-CM | POA: Insufficient documentation

## 2018-09-10 ENCOUNTER — Ambulatory Visit: Payer: Medicare HMO | Admitting: Physical Therapy

## 2018-09-10 ENCOUNTER — Other Ambulatory Visit: Payer: Self-pay

## 2018-09-10 ENCOUNTER — Encounter: Payer: Self-pay | Admitting: Physical Therapy

## 2018-09-10 DIAGNOSIS — R2689 Other abnormalities of gait and mobility: Secondary | ICD-10-CM | POA: Diagnosis present

## 2018-09-10 DIAGNOSIS — M25572 Pain in left ankle and joints of left foot: Secondary | ICD-10-CM

## 2018-09-10 NOTE — Patient Instructions (Signed)
Access Code: [email or web address]  URL: [email or web address]  Date: 09/10/2018  Prepared by: Eulis Fosterheryl Aleila Syverson   Patient Education  Sit to Stand with Dan HumphreysWalker with Surgery Precautions  Walking with a Standard Walker - Non Weight Bearing  Car Transfer with [facility] Precautions [facility] [address], [address] [place], [place] [address] Phone # [phone] Fax [phone]

## 2018-09-10 NOTE — Therapy (Signed)
[facility] [address], [place], [place], [address] Phone: [phone]   Fax:  [phone]  Physical Therapy Evaluation/Discharge  Patient Details  Name: Luis Poole MRN: [id number] Date of Birth: 18-Sep-1940 Referring Provider (PT): Dr. Emogene Morgan B. Daws   Encounter Date: 09/10/2018  PT End of Session - 09/10/18 1217    Visit Number  1    Date for PT Re-Evaluation  09/10/18    Authorization Type  aetna medicare    PT Start Time  1015    PT Stop Time  1100    PT Time Calculation (min)  45 min    Activity Tolerance  Patient tolerated treatment well    Behavior During Therapy  WFL for tasks assessed/performed       Past Medical History:  Diagnosis Date  . Anemia, unspecified 06/08/2013  . Cataract   . CHF (congestive heart failure) ([place])   . Chronic kidney disease   . Diabetes mellitus   . Hypertension   . S/P lumbar fusion   . Stroke [facility])     Past Surgical History:  Procedure Laterality Date  . CATARACT EXTRACTION    . EYE SURGERY    . KNEE SURGERY  2006  . NECK SURGERY  2012  . [place]  2009  . TONSILECTOMY, ADENOIDECTOMY, BILATERAL MYRINGOTOMY AND TUBES      There were no vitals filed for this visit.   Subjective Assessment - 09/10/18 1023    Subjective  Patient will be having left ankle surgery to reduce the arthritis. Patient came into therapy with a rolling walker.     Patient Stated Goals  learn how to walk without weight on left foot.     Currently in Pain?  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 Emogene Morgan B. Daws    Prior Therapy  none      Precautions   Precautions  Fall      Restrictions   Weight Bearing Restrictions  No    Other Position/Activity Restrictions  after surgery will be non-weightbear for 8-10 weeks      Balance Screen   Has the patient fallen in the past 6 months  Yes    How many times?  1   fell on weight floor   Has the patient had a decrease in activity level because of a fear of falling?   Yes    Is the patient reluctant to leave their home because of a fear of falling?   No      Home Environment   Living Environment  Private residence    Living Arrangements  Other relatives   sister   Type of [place] entrance  Home Layout  One level    Home Equipment  Shower seat;Toilet riser;Grab bars - toilet;Grab bars - tub/shower;Hospital bed;Wheelchair - [employer] - 2 wheels      Prior Function   Level of Independence  Independent with basic ADLs    Vocation  Retired      Charity fundraiser Status  Within Functional Limits for tasks assessed      Observation/Other Assessments   Focus on Therapeutic Outcomes (FOTO)   therapist discrection limited 80% due to difficulty with walking and transfers      Posture/Postural Control   Posture/Postural Control  No significant limitations      ROM / Strength   AROM / PROM / Strength  Strength      Strength   Left Hip Flexion  3+/5    Left Hip Extension  3+/5    Left Hip ABduction  3+/5    Left Hip ADduction  4/5    Left Knee Flexion  4+/5    Left Knee Extension  4/5      Transfers   Transfers  Stand to Sit;Sit to Stand    Sit to Stand  6: Modified independent (Device/Increase time);With upper extremity assist   trouble not putting weight on left foot   Stand to Sit  6: Modified independent (Device/Increase time);With upper extremity assist   trouble not  placing weight on left foot     Ambulation/Gait   Ambulation/Gait  Yes    Assistive device  Rolling walker    Gait Comments  was not able to bend left knee due to decreased ROM so he has to hike up the left hip to not place weight onto the left leg. Patient will rest left foot on the floor when using the walker. His left fot will go in front or the right foot due to decreased control for keep left leg in correct alignment                Objective measurements completed on examination: See above findings.              PT Education - 09/10/18 1216    Education Details  Access Code: [email or web address]     Person(s) Educated  Patient    Methods  Explanation;Demonstration;Verbal cues;Handout    Comprehension  Returned demonstration;Verbalized understanding          PT Long Term Goals - 09/10/18 1328      PT LONG TERM GOAL #1   Title  understand how to walk without placing weight on left leg for after surgery    Time  1    Period  Days    Status  Achieved    Target Date  09/10/18      PT LONG TERM GOAL #2   Title  understand how to get up and down from a chair without placing weight on left foot.     Time  1    Period  Days    Status  Achieved    Target Date  09/10/18             Plan - 09/10/18 1222    Clinical Impression Statement  Patient is a 78 year old male who will be having left ankle surgery in the future due to arthritis. Patient will not be able to place weight on left foot for 8-10 weeks after surgery. Patient has a history of left foot drop, stroke with left lower extremity weakness, CHF, lumbar  fusion, and diabetes. Patient has weakness in left hip and knee. He is wearing a brace on left ankle due to foot drop. Patient presently walks with a rolling walker with a seat. He tried in therapy to walk with a standard rolling walker with difficulty not placing weight on left foot. Patient is not able to flex left knee to prevent weight on left foot. He will  bring his left foot on the ground or the left hip will adduct so the left foot in front of the right. Patient was instructed to get out of a chair with using his armrests and not place weight on left foot but was struggling. Patient has a hospital bed, raised toilet, shower chair, grab bars in shower, and wheelchair at home. It seems patient has family supports for assistance. Patient will require therapy after surgery for further gait training so he is not placing weight on left foot due to  present co-morbidities.    History and Personal Factors relevant to plan of care:  CHF, Diabetes; s/p lumbar fusion, stroke, left drop foot    Clinical Presentation  Evolving    Clinical Presentation due to:  difficulty with instruction to not place weight on left foot with walking and transitional movements    Clinical Decision Making  Moderate    Rehab Potential  Good    Clinical Impairments Affecting Rehab Potential  CHF, Diabetes; s/p lumbar fusion, stroke, left drop foot    PT Frequency  One time visit    PT Duration  Other (comment)   1 time visit for eval and gait training   PT Treatment/Interventions  Therapeutic activities;Gait training;Patient/family education    PT Next Visit Plan  discharge due to 1 time visit as per MD    PT Home Exercise Plan  gait trainging with walker    Consulted and Agree with Plan of Care  Patient       Patient will benefit from skilled therapeutic intervention in order to improve the following deficits and impairments:  Abnormal gait, Pain, Decreased strength  Visit Diagnosis: Other abnormalities of gait and mobility - Plan: PT plan of care cert/re-cert  Pain in left ankle and joints of left foot - Plan: PT plan of care cert/re-cert     Problem List Patient Active Problem List   Diagnosis Date Noted  . Syncope 10/06/2017  . Chest pain 10/06/2017  . Low back pain 06/28/2017  . Leg swelling 08/23/2013  . Left leg swelling 08/23/2013  . Neuropathy 08/23/2013   . Acute gout 08/23/2013  . D-dimer, elevated 08/23/2013  . CKD (chronic kidney disease), stage III ([place]) 08/23/2013  . Chronic diastolic heart failure ([place]) 08/23/2013  . Anemia 06/08/2013  . Hypertension 08/29/2012  . Diabetes mellitus type 2 in nonobese ([place]) 08/29/2012  . History of CVA (cerebrovascular accident) 08/28/2012    Class: Acute  . L-S radiculopathy 08/28/2012  . Lumbar post-laminectomy syndrome 01/15/2012  . Thoracic or lumbosacral neuritis or radiculitis, unspecified 01/15/2012  . Left hemiparesis ([place]) 01/15/2012  . Diabetic peripheral neuropathy ([place]) 01/15/2012    Earlie Counts, PT 09/10/18 1:34 PM    [facility] [address], [place], [place], [address] Phone: [phone]   Fax:  [phone]  Name: Luis Poole MRN: [id number] Date of Birth: Jul 27, 1941 PHYSICAL THERAPY DISCHARGE SUMMARY  Visits from Start of Care: 1  Current functional level related to goals / functional outcomes: See above.    Remaining deficits: See above  Education / Equipment: HEP Plan: Patient agrees to discharge.  Patient goals were met. Patient is being discharged due to meeting the stated rehab goals. Thank you for the referral. Earlie Counts, PT 09/10/18 1:34 PM   ?????

## 2019-08-31 ENCOUNTER — Emergency Department (HOSPITAL_COMMUNITY)
Admission: EM | Admit: 2019-08-31 | Discharge: 2019-09-01 | Disposition: A | Discharge status: Home / Self Care | Payer: Medicare HMO | Attending: Emergency Medicine | Admitting: Emergency Medicine

## 2019-08-31 ENCOUNTER — Other Ambulatory Visit: Payer: Self-pay

## 2019-08-31 ENCOUNTER — Encounter (HOSPITAL_COMMUNITY): Payer: Self-pay

## 2019-08-31 ENCOUNTER — Emergency Department (HOSPITAL_COMMUNITY): Payer: Medicare HMO

## 2019-08-31 DIAGNOSIS — R05 Cough: Secondary | ICD-10-CM | POA: Insufficient documentation

## 2019-08-31 DIAGNOSIS — U071 COVID-19: Secondary | ICD-10-CM | POA: Diagnosis not present

## 2019-08-31 DIAGNOSIS — R0789 Other chest pain: Secondary | ICD-10-CM | POA: Diagnosis present

## 2019-08-31 DIAGNOSIS — I5032 Chronic diastolic (congestive) heart failure: Secondary | ICD-10-CM | POA: Diagnosis not present

## 2019-08-31 DIAGNOSIS — I130 Hypertensive heart and chronic kidney disease with heart failure and stage 1 through stage 4 chronic kidney disease, or unspecified chronic kidney disease: Secondary | ICD-10-CM | POA: Diagnosis not present

## 2019-08-31 DIAGNOSIS — N183 Chronic kidney disease, stage 3 unspecified: Secondary | ICD-10-CM | POA: Insufficient documentation

## 2019-08-31 DIAGNOSIS — Z7984 Long term (current) use of oral hypoglycemic drugs: Secondary | ICD-10-CM | POA: Insufficient documentation

## 2019-08-31 DIAGNOSIS — R0602 Shortness of breath: Secondary | ICD-10-CM | POA: Insufficient documentation

## 2019-08-31 DIAGNOSIS — E1122 Type 2 diabetes mellitus with diabetic chronic kidney disease: Secondary | ICD-10-CM | POA: Insufficient documentation

## 2019-08-31 DIAGNOSIS — R079 Chest pain, unspecified: Secondary | ICD-10-CM

## 2019-08-31 DIAGNOSIS — Z79899 Other long term (current) drug therapy: Secondary | ICD-10-CM | POA: Insufficient documentation

## 2019-08-31 LAB — COMPREHENSIVE METABOLIC PANEL
ALT: 15 U/L (ref 0–44)
AST: 32 U/L (ref 15–41)
Albumin: 3 g/dL — ABNORMAL LOW (ref 3.5–5.0)
Alkaline Phosphatase: 52 U/L (ref 38–126)
Anion gap: 9 (ref 5–15)
BUN: 13 mg/dL (ref 8–23)
CO2: 22 mmol/L (ref 22–32)
Calcium: 8.2 mg/dL — ABNORMAL LOW (ref 8.9–10.3)
Chloride: 100 mmol/L (ref 98–111)
Creatinine, Ser: 1.19 mg/dL (ref 0.61–1.24)
GFR calc Af Amer: >60 mL/min (ref >60)
GFR calc non Af Amer: 59 mL/min — ABNORMAL LOW (ref >60)
Glucose, Bld: 110 mg/dL — ABNORMAL HIGH (ref 70–99)
Potassium: 3.4 mmol/L — ABNORMAL LOW (ref 3.5–5.1)
Sodium: 131 mmol/L — ABNORMAL LOW (ref 135–145)
Total Bilirubin: 1 mg/dL (ref 0.3–1.2)
Total Protein: 7.2 g/dL (ref 6.5–8.1)

## 2019-08-31 LAB — CBC
HCT: 35.6 % — ABNORMAL LOW (ref 39.0–52.0)
Hemoglobin: 11.3 g/dL — ABNORMAL LOW (ref 13.0–17.0)
MCH: 25.8 pg — ABNORMAL LOW (ref 26.0–34.0)
MCHC: 31.7 g/dL (ref 30.0–36.0)
MCV: 81.3 fL (ref 80.0–100.0)
Platelets: 202 10*3/uL (ref 150–400)
RBC: 4.38 MIL/uL (ref 4.22–5.81)
RDW: 14.7 % (ref 11.5–15.5)
WBC: 3.4 10*3/uL — ABNORMAL LOW (ref 4.0–10.5)
nRBC: 0 % (ref 0.0–0.2)

## 2019-08-31 LAB — RESPIRATORY PANEL BY RT PCR (FLU A&B, COVID)
Influenza A by PCR: NEGATIVE
Influenza B by PCR: NEGATIVE
SARS Coronavirus 2 by RT PCR: POSITIVE — AB

## 2019-08-31 LAB — TROPONIN I (HIGH SENSITIVITY): Troponin I (High Sensitivity): 15 ng/L (ref <18)

## 2019-08-31 LAB — POC SARS CORONAVIRUS 2 AG -  ED: SARS Coronavirus 2 Ag: NEGATIVE

## 2019-08-31 LAB — D-DIMER, QUANTITATIVE: D-Dimer, Quant: 2.29 ug/mL-FEU — ABNORMAL HIGH (ref 0.00–0.50)

## 2019-08-31 MED ORDER — SODIUM CHLORIDE 0.9% FLUSH: 3.0000 mL | Freq: Once | INTRAVENOUS | Status: DC

## 2019-08-31 MED ORDER — ASPIRIN 81 MG PO CHEW: 324.0000 mg | CHEWABLE_TABLET | Freq: Once | ORAL | Status: DC

## 2019-08-31 NOTE — ED Triage Notes (Signed)
Per [facility] pt originally called for "feling sick" after assessing pt her reports dry mouth, nausea, non radiating CP and cough x2 days.  Pt describes his CP as heart burn.   BP 180/90 gave nitro x1 120/710 HR 80 RR 18 99% RA

## 2019-08-31 NOTE — ED Provider Notes (Signed)
[facility] EMERGENCY DEPARTMENT Provider Note   CSN: [id number] Arrival date & time: 08/31/19  1545     History Chief Complaint  Patient presents with  . Chest Pain    Luis Poole is a 79 y.o. male with PMHx HTN, Diabetes, CKD stage III, CHF, who presents to the ED today complaining of sudden onset, intermittent, substernal chest pain x 5 days. Pt also complains of shortness of breath, dry cough, headache, and nausea. He states that the chest pain will last 2-3 hours before subsiding without any intervention. He is denying any recent sick contacts or COVID 19 positive exposure. No fevers or chills. No hx of DVT/PE. No recent prolonged travel or immobilization. Pt reports he is currently being treated for a wound to his L heal s/p surgery 10/14/2018 for left foot drop with fusion of ankle joint. He began having chest pain on his way to the wound clinic in [place] 5 days ago. No hemoptysis. No active malignancy. No exogenous hormone use.   The history is provided by the patient and medical records.       Past Medical History:  Diagnosis Date  . Anemia, unspecified 06/08/2013  . Cataract   . CHF (congestive heart failure) ([facility])   . Chronic kidney disease   . Diabetes mellitus   . Hypertension   . S/P lumbar fusion   . Stroke [facility])     Patient Active Problem List   Diagnosis Date Noted  . Syncope 10/06/2017  . Chest pain 10/06/2017  . Low back pain 06/28/2017  . Leg swelling 08/23/2013  . Left leg swelling 08/23/2013  . Neuropathy 08/23/2013  . Acute gout 08/23/2013  . D-dimer, elevated 08/23/2013  . CKD (chronic kidney disease), stage III ([facility]) 08/23/2013  . Chronic diastolic heart failure (H[facility]) 08/23/2013  . Anemia 06/08/2013  . Hypertension 08/29/2012  . Diabetes mellitus type 2 in nonobese ([facility]) 08/29/2012  . History of CVA (cerebrovascular accident) 08/28/2012    Class: Acute  . L-S radiculopathy 08/28/2012  . Lumbar post-laminectomy  syndrome 01/15/2012  . Thoracic or lumbosacral neuritis or radiculitis, unspecified 01/15/2012  . Left hemiparesis (H[facility]) 01/15/2012  . Diabetic peripheral neuropathy (H[facility]) 01/15/2012    Past Surgical History:  Procedure Laterality Date  . CATARACT EXTRACTION    . EYE SURGERY    . KNEE SURGERY  2006  . NECK SURGERY  2012  . SPINE SURGERY  2009  . TONSILECTOMY, ADENOIDECTOMY, BILATERAL MYRINGOTOMY AND TUBES         Family History  Problem Relation Age of Onset  . Stroke Mother     Social History   Tobacco Use  . Smoking status: Former Games developer  . Smokeless tobacco: Former Engineer, water Use Topics  . Alcohol use: No  . Drug use: No    Home Medications Prior to Admission medications   Medication Sig Start Date End Date Taking? Authorizing Provider  acetaminophen (TYLENOL) 650 MG CR tablet Take 1 tablet (650 mg total) by mouth 2 (two) times daily. 02/22/17   Derwood Kaplan, MD  amLODipine (NORVASC) 10 MG tablet Take 10 mg by mouth daily.     [provider]  aspirin EC 81 MG tablet Take 81 mg by mouth daily.    [provider]  atorvastatin (LIPITOR) 20 MG tablet Take 1 tablet (20 mg total) by mouth daily. 10/09/17   Burnadette Pop, MD  colchicine 0.6 MG tablet Take 1 tablet (0.6 mg total) daily by mouth. Patient  taking differently: Take 0.6 mg by mouth daily as needed (gout pain).  06/29/17   Maxie Barb, MD  doxazosin (CARDURA) 8 MG tablet Take 8 mg by mouth at bedtime.     [provider]  ferrous sulfate 325 (65 FE) MG tablet Take 325 mg by mouth daily with breakfast.    [provider]  furosemide (LASIX) 20 MG tablet Take 1 tablet (20 mg total) by mouth daily. 09/24/17   Elpidio Anis, PA-C  HYDROcodone-acetaminophen (NORCO) 7.5-325 MG tablet Take 1 tablet by mouth every 6 (six) hours as needed for moderate pain. 09/04/17   Geoffery Lyons, MD  ibuprofen (ADVIL,MOTRIN) 600 MG tablet Take 1 tablet (600 mg total) by mouth every 6  (six) hours as needed. 09/24/17   Elpidio Anis, PA-C  pregabalin (LYRICA) 100 MG capsule Take 100 mg by mouth 2 (two) times daily.    [provider]  terbinafine (LAMISIL) 250 MG tablet Take 250 mg by mouth daily. 05/14/17   [provider]  valsartan (DIOVAN) 160 MG tablet Take 160 mg daily by mouth.    [provider]    Allergies    Lisinopril and Gabapentin  Review of Systems   Review of Systems  Constitutional: Negative for chills and fever.  HENT: Negative for congestion.   Eyes: Negative for visual disturbance.  Respiratory: Positive for cough and shortness of breath.   Cardiovascular: Positive for chest pain.  Gastrointestinal: Positive for nausea. Negative for abdominal pain, diarrhea and vomiting.  Genitourinary: Negative for difficulty urinating.  Musculoskeletal: Negative for myalgias.  Skin: Negative for rash.  Neurological: Positive for headaches. Negative for dizziness, syncope, speech difficulty, weakness, light-headedness and numbness.    Physical Exam Updated Vital Signs BP (!) 142/75 (BP Location: Left Arm)   Pulse 81   Temp 99.2 F (37.3 C) (Oral)   Resp (!) 24   Ht 5\' 7"  (1.702 m)   Wt 99.8 kg   SpO2 98%   BMI 34.46 kg/m   Physical Exam Vitals and nursing note reviewed.  Constitutional:      Appearance: He is not ill-appearing or diaphoretic.  HENT:     Head: Normocephalic and atraumatic.  Eyes:     Extraocular Movements: Extraocular movements intact.     Conjunctiva/sclera: Conjunctivae normal.     Pupils: Pupils are equal, round, and reactive to light.  Cardiovascular:     Rate and Rhythm: Normal rate and regular rhythm.     Pulses:          Radial pulses are 2+ on the right side and 2+ on the left side.     Heart sounds: Normal heart sounds.  Pulmonary:     Effort: Pulmonary effort is normal.     Breath sounds: Normal breath sounds. No decreased breath sounds, wheezing, rhonchi or rales.  Chest:     Chest wall:  No tenderness.  Abdominal:     Palpations: Abdomen is soft.     Tenderness: There is no abdominal tenderness.  Musculoskeletal:     Cervical back: Neck supple.     Right lower leg: No tenderness. No edema.     Left lower leg: No tenderness. No edema.     Comments: Cam walker on left foot with ace bandage  Skin:    General: Skin is warm and dry.  Neurological:     Mental Status: He is alert.     Comments: CN 3-12 grossly intact A&O x4 GCS 15 Sensation and strength  intact Coordination with finger-to-nose WNL Neg pronator drift     ED Results / Procedures / Treatments   Labs (all labs ordered are listed, but only abnormal results are displayed) Labs Reviewed  RESPIRATORY PANEL BY RT PCR (FLU A&B, COVID) - Abnormal; Notable for the following components:      Result Value   SARS Coronavirus 2 by RT PCR POSITIVE (*)    All other components within normal limits  CBC - Abnormal; Notable for the following components:   WBC 3.4 (*)    Hemoglobin 11.3 (*)    HCT 35.6 (*)    MCH 25.8 (*)    All other components within normal limits  D-DIMER, QUANTITATIVE (NOT AT [facility]) - Abnormal; Notable for the following components:   D-Dimer, Quant 2.29 (*)    All other components within normal limits  COMPREHENSIVE METABOLIC PANEL - Abnormal; Notable for the following components:   Sodium 131 (*)    Potassium 3.4 (*)    Glucose, Bld 110 (*)    Calcium 8.2 (*)    Albumin 3.0 (*)    GFR calc non Af Amer 59 (*)    All other components within normal limits  POC SARS CORONAVIRUS 2 AG -  ED  TROPONIN I (HIGH SENSITIVITY)    EKG EKG Interpretation  Date/Time:  Tuesday August 31 2019 15:45:53 EST Ventricular Rate:  81 PR Interval:    QRS Duration: 153 QT Interval:  411 QTC Calculation: 478 R Axis:   53 Text Interpretation: Sinus rhythm Right bundle branch block When compared to last ECG on 10/07/17 RBBB now present. When compared to 10/06/17, similar RBBB> No STEMI Confirmed by Theda Belfast ([id number]) on 08/31/2019 3:49:23 PM   Radiology DG Chest Portable 1 View  Result Date: 08/31/2019 CLINICAL DATA:  Chest pain and cough for 2 days. EXAM: PORTABLE CHEST 1 VIEW COMPARISON:  10/06/2017 FINDINGS: The cardiac silhouette, mediastinal and hilar contours are is stable. There is mild tortuosity of the thoracic aorta and mildly prominent pulmonary hila. Stable mild eventration of both hemidiaphragms with some overlying vascular crowding and atelectasis. No infiltrates or effusions. No worrisome pulmonary lesions. The bony thorax is intact. IMPRESSION: No acute cardiopulmonary findings. Electronically Signed   By: Rudie Meyer M.D.   On: 08/31/2019 16:35    Procedures Procedures (including critical care time)  Medications Ordered in ED Medications  sodium chloride flush (NS) 0.9 % injection 3 mL (3 mLs Intravenous Not Given 08/31/19 2137)    ED Course  I have reviewed the triage vital signs and the nursing notes.  Pertinent labs & imaging results that were available during my care of the patient were reviewed by me and considered in my medical decision making (see chart for details).  79 year old male who presents the ED today complaining of substernal chest pain for the past several days with cough, headache, nausea.  He denies any COVID-19 positive exposure or concern of Covid exposure.  Arrival to the ED patient's temp 99.2, pulse 81, respirations 24, blood pressure 142/75, satting 98% on room air.  Per triage report patient's blood pressure 180/90 with EMS and given 1 sublingual nitroglycerin with decrease 120/70.  Patient reported that the nitro gave him a headache and that his chest pain was controlled at my evaluation.  He does have a Cam walker to his left ankle from surgery 1 year ago, states that he has been dealing with a ulcer from the area since then, being seen by wound care.  Patient is not anticoagulated.  I am unable to [facility] him out at this time given age.  There is  concern for ACS at this time as well.  Patient's EKG does show right bundle branch block which is unchanged from previous EKG although most recent EKG when compared does not show a right bundle branch block.  Work-up for ACS.   Dimer elevated at 2.29 however lab results state that this has hemolyzed.  Nursing staff discussed this with lab who states that they resulted it anyways.  Patient does have history of elevated D-dimers, will try to send out an additional tube.   8:25 PM Pt has been in the ED for > 4 hours and continues to have hemolyzed specimens for blood draw. Unfortunately we have been unable to obtain a troponin on him nor a BMP for his kidney function for possible PE study. He has been stuck several times and multiple vials sent down to lab without success. Attending physician Dr. Sherry Ruffing to attempt ultrasound IV.   Troponin has returned at 15; given this is the 4th blood draw we will treat this as a delta troponin given pt has been here for 5+ hours.   COVID test has returned positive. Suspect this is the cause of pt's symptoms but given questionable elevated D dimer feel it is necessary to proceed with a DVT study. Will need a creatinine level prior.   Creatinine within normal limits for patient. Awaiting CTA at this time.   11:47 PM At shift change case signed out to N[place]a Alpha, [place], who will dispo patient accordingly after CTA returns. If no findings for PE patient to be discharged home with instructions to self isolate for 10 days given COVID positive status. He is to follow up outpatient with his PCP.   Clinical Course as of Aug 30 2346  Tue Aug 31, 2019  1721 D-Dimer, Quant(!): 2.29 [MV]  2036 Troponin I (High Sensitivity): 15 [MV]  2048 SARS Coronavirus 2 by RT PCR(!): POSITIVE [MV]  2335 CTA then d/c if negative   [BM]    Clinical Course User Index [BM] Deliah Boston, PA-C [MV] Eustaquio Maize, PA-C   This note was prepared using Dragon voice recognition  software and may include unintentional dictation errors due to the inherent limitations of voice recognition software.  Luis Poole was evaluated in Emergency Department on 08/31/2019 for the symptoms described in the history of present illness. He was evaluated in the context of the global COVID-19 pandemic, which necessitated consideration that the patient might be at risk for infection with the SARS-CoV-2 virus that causes COVID-19. Institutional protocols and algorithms that pertain to the evaluation of patients at risk for COVID-19 are in a state of rapid change based on information released by regulatory bodies including the CDC and federal and state organizations. These policies and algorithms were followed during the patient's care in the ED.  MDM Rules/Calculators/A&P                       Final Clinical Impression(s) / ED Diagnoses Final diagnoses:  Nonspecific chest pain  COVID-19    Rx / DC Orders ED Discharge Orders    None       Eustaquio Maize, PA-C 08/31/19 2348    Tegeler, Gwenyth Allegra, MD 09/03/19 [phone]

## 2019-08-31 NOTE — Discharge Instructions (Addendum)
You have been diagnosed today with COVID-19 virus infection and nonspecific chest pain.  At this time there does not appear to be the presence of an emergent medical condition, however there is always the potential for conditions to change. Please read and follow the below instructions.  Please return to the Emergency Department immediately for any new or worsening symptoms. Please be sure to follow up with your Primary Care Provider within one week regarding your visit today; please call their office to schedule an appointment even if you are feeling better for a follow-up visit. Your Covid test was positive today.  Please continue to quarantine yourself for the next 10 days to avoid spread of this virus.  Call your primary care doctor tomorrow morning to inform them of your positive Covid test and to schedule a follow-up appointment.  Please drink plenty of water and get plenty of rest to help with your symptoms.  Get help right away if: You have trouble breathing. You have pain or pressure in your chest. You have confusion. You have bluish lips and fingernails. You have difficulty waking from sleep. You suddenly start to sweat, or your skin gets clammy. You feel sick to your stomach (nauseous). You throw up (vomit). You suddenly feel lightheaded or dizzy. You feel very weak or tired. Your heart starts to beat fast, or it feels like it is skipping beats. You have any new/concerning or worsening of symptoms   Please read the additional information packets attached to your discharge summary.  Note: Portions of this text may have been transcribed using voice recognition software. Every effort was made to ensure accuracy; however, inadvertent computerized transcription errors may still be present.

## 2019-09-01 ENCOUNTER — Emergency Department (HOSPITAL_COMMUNITY): Payer: Medicare HMO

## 2019-09-01 ENCOUNTER — Telehealth: Payer: Self-pay | Admitting: Infectious Diseases

## 2019-09-01 ENCOUNTER — Encounter (HOSPITAL_COMMUNITY): Payer: Self-pay | Admitting: Radiology

## 2019-09-01 MED ORDER — IOHEXOL 350 MG/ML SOLN
80.0000 mL | Freq: Once | INTRAVENOUS | Status: AC | PRN
  Administered 2019-09-01: 80 mL via INTRAVENOUS

## 2019-09-01 NOTE — ED Notes (Signed)
Signature pad failed. A verbal signature as obtained

## 2019-09-01 NOTE — ED Notes (Signed)
Patient was ambulated while monitoring SPO2. SPO2 started at 100% and dropped to 95% at the end of ambulation. The patient did not report shortness of breath or chest pain.

## 2019-09-01 NOTE — Telephone Encounter (Signed)
Called to discuss with patient about Covid symptoms and the use of bamlanivimab, a monoclonal antibody infusion for those with mild to moderate Covid symptoms and at a high risk of hospitalization.  Pt is qualified for this infusion at the [facility] due to Age > 65   Message left to call back  

## 2019-09-01 NOTE — ED Provider Notes (Addendum)
Care Handoff received from [facility] PA-C at shift change.  Please see her note for full details of visit.  In short 79 year old male presents today with several day history of chest pain, cough, headache and nausea.  High-sensitivity troponin: 15 Covid positive D-dimer 2.29 CMP sodium 131, potassium 3.4, glucose 110, calcium 8.2, albumin 3.0, nonacute CBC WBC 3.4, hemoglobin 11.3, nonacute Chest x-ray:  IMPRESSION:  No acute cardiopulmonary findings.   EKG: Sinus rhythm Right bundle branch block When compared to last ECG on 10/07/17 RBBB now present. When compared to 10/06/17, similar RBBB> No STEMI Confirmed by Theda Belfast ([id number]) on 08/31/2019 3:49:23 PM  Plan of care at shift change is to await CT angio PE study, pending no embolus patient to be discharged home and to isolate. Physical Exam  BP (!) 166/86   Pulse 66   Temp 99.2 F (37.3 C) (Oral)   Resp 16   Ht 5\' 7"  (1.702 m)   Wt 99.8 kg   SpO2 100%   BMI 34.46 kg/m   Physical Exam Constitutional:      General: He is not in acute distress.    Appearance: Normal appearance. He is well-developed. He is not ill-appearing or diaphoretic.     Comments: Talking comfortably on phone with family  HENT:     Head: Normocephalic and atraumatic.     Right Ear: External ear normal.     Left Ear: External ear normal.     Nose: Nose normal.  Eyes:     General: Vision grossly intact. Gaze aligned appropriately.     Pupils: Pupils are equal, round, and reactive to light.  Neck:     Trachea: Trachea and phonation normal. No tracheal deviation.  Pulmonary:     Effort: Pulmonary effort is normal. No respiratory distress.  Musculoskeletal:        General: Normal range of motion.     Cervical back: Normal range of motion.  Skin:    General: Skin is warm and dry.  Neurological:     Mental Status: He is alert.     GCS: GCS eye subscore is 4. GCS verbal subscore is 5. GCS motor subscore is 6.     Comments: Speech is clear and  goal oriented, follows commands Major Cranial nerves without deficit, no facial droop Moves extremities without ataxia, coordination intact  Psychiatric:        Behavior: Behavior normal.    ED Course/Procedures   Clinical Course as of Sep 01 343  Tue Aug 31, 2019  1721 D-Dimer, Quant(!): 2.29 [MV]  2036 Troponin I (High Sensitivity): 15 [MV]  2048 SARS Coronavirus 2 by RT PCR(!): POSITIVE [MV]    Clinical Course User Index [MV] 2049, PA-C    Procedures  MDM  CT Angio:  IMPRESSION:  1. Negative for acute pulmonary embolus.  2. Scattered bilateral COVID-19 pneumonia. No pleural effusion.  3. Calcified coronary artery atherosclerosis.  4. Moderate size gastric hiatal hernia.   - Patient ambulated by nursing staff without hypoxia on room air, no complaints of chest pain or shortness of breath during ambulation. - On reevaluation patient is resting comfortably no acute distress reports he is feeling well and like to be discharged as soon as possible.  He states understanding of work-up above end of care plan, he has no further questions or concerns.  He will self isolate to avoid spread of Covid virus and call his PCP tomorrow morning to inform them of his positive Covid test  and to schedule a follow-up visit.  As discussed with previous care team do not feel further work-up is needed at this time, chest pain ongoing for multiple days with a high-sensitivity troponin within normal limits here, no delta is indicated, doubt ACS as etiology of patient's symptoms.  There is no evidence of PE on CT scan today additionally history and work-up inconsistent with dissection.  Suspect patient symptoms today due to COVID-19 viral infection, there is no indication for admission or further work-up at this time. VSS with sp02 100% on room air.  At this time there does not appear to be any evidence of an acute emergency medical condition and the patient appears stable for discharge with  appropriate outpatient follow up. Diagnosis was discussed with patient who verbalizes understanding of care plan and is agreeable to discharge. I have discussed return precautions with patient who verbalizes understanding of return precautions. Patient encouraged to follow-up with their PCP. All questions answered.  Patient's case discussed with Dr. Dayna Barker who agrees with plan to discharge with follow-up.   Luis Poole was evaluated in Emergency Department on 09/01/2019 for the symptoms described in the history of present illness. He was evaluated in the context of the global COVID-19 pandemic, which necessitated consideration that the patient might be at risk for infection with the SARS-CoV-2 virus that causes COVID-19. Institutional protocols and algorithms that pertain to the evaluation of patients at risk for COVID-19 are in a state of rapid change based on information released by regulatory bodies including the CDC and federal and state organizations. These policies and algorithms were followed during the patient's care in the ED.  Note: Portions of this report may have been transcribed using voice recognition software. Every effort was made to ensure accuracy; however, inadvertent computerized transcription errors may still be present.   Deliah Boston, PA-C 09/01/19 0331    Deliah Boston, PA-C 09/01/19 0345    Merrily Pew, MD 09/01/19 [phone]

## 2021-02-07 ENCOUNTER — Emergency Department (HOSPITAL_COMMUNITY)
Admission: EM | Admit: 2021-02-07 | Discharge: 2021-02-09 | Disposition: A | Discharge status: Home / Self Care | Payer: Medicare Other | Attending: Emergency Medicine | Admitting: Emergency Medicine

## 2021-02-07 ENCOUNTER — Encounter (HOSPITAL_COMMUNITY): Payer: Self-pay | Admitting: Emergency Medicine

## 2021-02-07 ENCOUNTER — Other Ambulatory Visit: Payer: Self-pay

## 2021-02-07 ENCOUNTER — Emergency Department (HOSPITAL_COMMUNITY): Payer: Medicare Other

## 2021-02-07 DIAGNOSIS — E1122 Type 2 diabetes mellitus with diabetic chronic kidney disease: Secondary | ICD-10-CM | POA: Insufficient documentation

## 2021-02-07 DIAGNOSIS — Z79899 Other long term (current) drug therapy: Secondary | ICD-10-CM | POA: Insufficient documentation

## 2021-02-07 DIAGNOSIS — Z7982 Long term (current) use of aspirin: Secondary | ICD-10-CM | POA: Insufficient documentation

## 2021-02-07 DIAGNOSIS — I130 Hypertensive heart and chronic kidney disease with heart failure and stage 1 through stage 4 chronic kidney disease, or unspecified chronic kidney disease: Secondary | ICD-10-CM | POA: Insufficient documentation

## 2021-02-07 DIAGNOSIS — Z87891 Personal history of nicotine dependence: Secondary | ICD-10-CM | POA: Diagnosis not present

## 2021-02-07 DIAGNOSIS — R531 Weakness: Secondary | ICD-10-CM | POA: Diagnosis present

## 2021-02-07 DIAGNOSIS — M5417 Radiculopathy, lumbosacral region: Secondary | ICD-10-CM | POA: Insufficient documentation

## 2021-02-07 DIAGNOSIS — Z20822 Contact with and (suspected) exposure to covid-19: Secondary | ICD-10-CM | POA: Insufficient documentation

## 2021-02-07 DIAGNOSIS — G8194 Hemiplegia, unspecified affecting left nondominant side: Secondary | ICD-10-CM | POA: Insufficient documentation

## 2021-02-07 DIAGNOSIS — N189 Chronic kidney disease, unspecified: Secondary | ICD-10-CM | POA: Diagnosis not present

## 2021-02-07 DIAGNOSIS — I509 Heart failure, unspecified: Secondary | ICD-10-CM | POA: Diagnosis not present

## 2021-02-07 DIAGNOSIS — R202 Paresthesia of skin: Secondary | ICD-10-CM | POA: Diagnosis not present

## 2021-02-07 LAB — CBC WITH DIFFERENTIAL/PLATELET
Abs Immature Granulocytes: 0.01 10*3/uL (ref 0.00–0.07)
Basophils Absolute: 0 10*3/uL (ref 0.0–0.1)
Basophils Relative: 0 %
Eosinophils Absolute: 0.1 10*3/uL (ref 0.0–0.5)
Eosinophils Relative: 3 %
HCT: 36.2 % — ABNORMAL LOW (ref 39.0–52.0)
Hemoglobin: 11.6 g/dL — ABNORMAL LOW (ref 13.0–17.0)
Immature Granulocytes: 0 %
Lymphocytes Relative: 42 %
Lymphs Abs: 2.1 10*3/uL (ref 0.7–4.0)
MCH: 28.2 pg (ref 26.0–34.0)
MCHC: 32 g/dL (ref 30.0–36.0)
MCV: 87.9 fL (ref 80.0–100.0)
Monocytes Absolute: 0.4 10*3/uL (ref 0.1–1.0)
Monocytes Relative: 8 %
Neutro Abs: 2.3 10*3/uL (ref 1.7–7.7)
Neutrophils Relative %: 47 %
Platelets: 204 10*3/uL (ref 150–400)
RBC: 4.12 MIL/uL — ABNORMAL LOW (ref 4.22–5.81)
RDW: 14 % (ref 11.5–15.5)
WBC: 5 10*3/uL (ref 4.0–10.5)
nRBC: 0 % (ref 0.0–0.2)

## 2021-02-07 LAB — COMPREHENSIVE METABOLIC PANEL
ALT: 13 U/L (ref 0–44)
AST: 22 U/L (ref 15–41)
Albumin: 4 g/dL (ref 3.5–5.0)
Alkaline Phosphatase: 76 U/L (ref 38–126)
Anion gap: 8 (ref 5–15)
BUN: 22 mg/dL (ref 8–23)
CO2: 25 mmol/L (ref 22–32)
Calcium: 9.1 mg/dL (ref 8.9–10.3)
Chloride: 105 mmol/L (ref 98–111)
Creatinine, Ser: 1.21 mg/dL (ref 0.61–1.24)
GFR, Estimated: >60 mL/min (ref >60)
Glucose, Bld: 121 mg/dL — ABNORMAL HIGH (ref 70–99)
Potassium: 4 mmol/L (ref 3.5–5.1)
Sodium: 138 mmol/L (ref 135–145)
Total Bilirubin: 0.5 mg/dL (ref 0.3–1.2)
Total Protein: 8 g/dL (ref 6.5–8.1)

## 2021-02-07 NOTE — ED Triage Notes (Signed)
Patient complaining of leg weakness since yesterday. Patient states he tries to stand up and fall back in chair. Patient states his legs are weak. Patient has hx of leg weakness.

## 2021-02-07 NOTE — ED Provider Notes (Signed)
Emergency Medicine Provider Triage Evaluation Note  Luis Poole 80 y.o. male was evaluated in triage.  Pt complains of weakness of his bilateral lower extremities as well as back pain.  Patient reports that he has history of leg issues and states he broke his ankle.  He has been doing PT to help him walk.  He states yesterday, he drove a long time from [place] and states he did not get the stretch his legs and since then he feels like his legs have been weak.  He states today, he felt like they cannot hold him up and he fell, landing on his back.  Did not hit his head or lose consciousness.  No numbness.  Denies any chest pain, difficulty breathing, abdominal pain, nausea/vomiting   Review of Systems  Positive: Leg weakness, back pain Negative: Fevers, chest pain, difficulty breathing.  Physical Exam  BP 134/82   Pulse 70   Temp 98.2 F (36.8 C) (Oral)   Resp 18   Ht 5\' 4"  (1.626 m)   Wt 65.8 kg   SpO2 100%   BMI 24.89 kg/m  Gen:   Awake, no distress   HEENT:  Atraumatic  Resp:  Normal effort  Cardiac:  Normal rate  Abd:   Nondistended, nontender  MSK:   Left lower extremity in a brace.  No overlying warmth, erythema, edema.  Bilateral lower extremities are warm to touch without dusky in appearance. Neuro:  Speech clear.  He can lift both legs up.  5/5 strength of bilateral upper extremities.  Other:     Medical Decision Making  Medically screening exam initiated at 9:56 PM  Appropriate orders placed.  was informed that the remainder of the evaluation will be completed by another provider, this initial triage assessment does not replace that evaluation. They are counseled that they will need to remain in the ED until the completion of their workup, including full H&P and results of any tests.  Risks of leaving the emergency department prior to completion of treatment were discussed. Patient was advised to inform ED staff if they are leaving before their treatment  is complete. The patient acknowledged these risks and time was allowed for questions.     The patient appears stable so that the remainder of the MSE may be completed by another provider.    Clinical Impression  Bilateral lower extremities weakness.   Portions of this note were generated with Antony Odea. Dictation errors may occur despite best attempts at proofreading.     Scientist, clinical (histocompatibility and immunogenetics), PA-C 02/07/21 2157    [id number], MD 02/07/21 2228

## 2021-02-08 ENCOUNTER — Emergency Department (HOSPITAL_COMMUNITY): Payer: Medicare Other

## 2021-02-08 LAB — RESP PANEL BY RT-PCR (FLU A&B, COVID) ARPGX2
Influenza A by PCR: NEGATIVE
Influenza B by PCR: NEGATIVE
SARS Coronavirus 2 by RT PCR: NEGATIVE

## 2021-02-08 LAB — URINALYSIS, ROUTINE W REFLEX MICROSCOPIC
Bacteria, UA: NONE SEEN
Bilirubin Urine: NEGATIVE
Glucose, UA: NEGATIVE mg/dL
Ketones, ur: NEGATIVE mg/dL
Leukocytes,Ua: NEGATIVE
Nitrite: NEGATIVE
Protein, ur: NEGATIVE mg/dL
Specific Gravity, Urine: 1.008 (ref 1.005–1.030)
pH: 6 (ref 5.0–8.0)

## 2021-02-08 MED ORDER — COLCHICINE 0.6 MG PO TABS: 0.6000 mg | ORAL_TABLET | Freq: Every day | ORAL | Status: DC | PRN

## 2021-02-08 MED ORDER — IRBESARTAN 150 MG PO TABS
150.0000 mg | ORAL_TABLET | Freq: Every day | ORAL | Status: DC
  Administered 2021-02-09: 150 mg via ORAL
  Filled 2021-02-08: qty 1

## 2021-02-08 MED ORDER — ALUM & MAG HYDROXIDE-SIMETH 200-200-20 MG/5ML PO SUSP: 30.0000 mL | Freq: Four times a day (QID) | ORAL | Status: DC | PRN

## 2021-02-08 MED ORDER — OMEGA-3-ACID ETHYL ESTERS 1 G PO CAPS
1.0000 g | ORAL_CAPSULE | Freq: Every day | ORAL | Status: DC
  Administered 2021-02-09: 1 g via ORAL
  Filled 2021-02-08: qty 1

## 2021-02-08 MED ORDER — LORAZEPAM 2 MG/ML IJ SOLN
1.0000 mg | Freq: Once | INTRAMUSCULAR | Status: AC
  Administered 2021-02-08: 2 mg via INTRAVENOUS
  Filled 2021-02-08: qty 1

## 2021-02-08 MED ORDER — ACETAMINOPHEN 325 MG PO TABS
650.0000 mg | ORAL_TABLET | ORAL | Status: DC | PRN
  Administered 2021-02-08: 650 mg via ORAL
  Filled 2021-02-08: qty 2

## 2021-02-08 MED ORDER — ATORVASTATIN CALCIUM 10 MG PO TABS
20.0000 mg | ORAL_TABLET | Freq: Every day | ORAL | Status: DC
  Administered 2021-02-08 – 2021-02-09 (×2): 20 mg via ORAL
  Filled 2021-02-08 (×2): qty 2

## 2021-02-08 MED ORDER — PREGABALIN 50 MG PO CAPS
100.0000 mg | ORAL_CAPSULE | Freq: Two times a day (BID) | ORAL | Status: DC
  Administered 2021-02-08 – 2021-02-09 (×2): 100 mg via ORAL
  Filled 2021-02-08 (×2): qty 2

## 2021-02-08 MED ORDER — ASPIRIN EC 81 MG PO TBEC
81.0000 mg | DELAYED_RELEASE_TABLET | Freq: Every day | ORAL | Status: DC
  Administered 2021-02-08 – 2021-02-09 (×2): 81 mg via ORAL
  Filled 2021-02-08 (×2): qty 1

## 2021-02-08 MED ORDER — FERROUS SULFATE 325 (65 FE) MG PO TABS
325.0000 mg | ORAL_TABLET | Freq: Every day | ORAL | Status: DC
  Administered 2021-02-09: 325 mg via ORAL
  Filled 2021-02-08: qty 1

## 2021-02-08 MED ORDER — AMLODIPINE BESYLATE 5 MG PO TABS: 10.0000 mg | ORAL_TABLET | Freq: Every day | ORAL | Status: DC

## 2021-02-08 MED ORDER — PREGABALIN 50 MG PO CAPS: 100.0000 mg | ORAL_CAPSULE | Freq: Two times a day (BID) | ORAL | Status: DC

## 2021-02-08 MED ORDER — ONDANSETRON HCL 4 MG PO TABS: 4.0000 mg | ORAL_TABLET | Freq: Three times a day (TID) | ORAL | Status: DC | PRN

## 2021-02-08 MED ORDER — FUROSEMIDE 40 MG PO TABS
20.0000 mg | ORAL_TABLET | Freq: Every day | ORAL | Status: DC
  Administered 2021-02-08 – 2021-02-09 (×2): 20 mg via ORAL
  Filled 2021-02-08 (×2): qty 1

## 2021-02-08 MED ORDER — AMLODIPINE BESYLATE 5 MG PO TABS
10.0000 mg | ORAL_TABLET | Freq: Every day | ORAL | Status: DC
  Administered 2021-02-09: 10 mg via ORAL
  Filled 2021-02-08: qty 2

## 2021-02-08 NOTE — ED Notes (Addendum)
Pt made aware MRI will be coming to perform another scan shortly.  Verbalized understanding.

## 2021-02-08 NOTE — ED Notes (Signed)
Pt reports he goes to PT on Tuesdays and Thursdays.  Sts he has a wheelchair, walker, and "two strong kids."

## 2021-02-08 NOTE — Evaluation (Addendum)
Physical Therapy Evaluation Patient Details Name: Luis Poole MRN: [id number] DOB: [birth year] Today's Date: 02/08/2021   History of Present Illness  Luis Poole 80 y.o. male was evaluated in triage. Pt complains of weakness of his bilateral lower extremities as well as back pain. Thoracic MRI: Multilevel disc degeneration. No significant stenosis in the thoracic spine, Negative for fracture. Lumbar MRI: mild to moderate bilateral foraminal stenosis at L2-L3 and L4-L5, Similar right far lateral/extraforaminal disc protrusion at L3-L4, contacting the exiting/exited right L3 nerve, Mild progression of degenerative disc disease at L2-L3 and L3-L4 with similar mild canal stenosis at these levels. PMH: CHF, CKD, diabetes, HTN, stroke, lumbar fusion 2009  Clinical Impression  Pt admitted with above diagnosis. Pt reports being independent with rollator at baseline, wears L AFO, reports only 2 falls in last 6 months which both happened yesterday. Pt currently requiring +2 assist with all mobility, limited to 3 sidesteps up to [facility] with hands steadying on chair back in room. Pt reports chronic low back pain without change with mobility, denies numbness/tingling to BLE with sensation testing. Pt with increased difficulty abducting and flexing L LE in standing. Pt open to rehab prior to return home, recommending SNF for ST strengthening prior to return home with son and son's child. Pt currently with functional limitations due to the deficits listed below (see PT Problem List). Pt will benefit from skilled PT to increase their independence and safety with mobility to allow discharge to the venue listed below.       Follow Up Recommendations SNF    Equipment Recommendations  None recommended by PT    Recommendations for Other Services       Precautions / Restrictions Precautions Precautions: Fall Required Braces or Orthoses: Other Brace Other Brace: L AFO Restrictions Weight Bearing Restrictions: No       Mobility  Bed Mobility Overal bed mobility: Needs Assistance Bed Mobility: Supine to Sit;Sit to Supine  Supine to sit: Max assist;+2 for physical assistance Sit to supine: Mod assist;+2 for physical assistance   General bed mobility comments: max A +2 for supine to sit for trunk uprighting and BLE mobility, educated pt on log rolling for pt comfort with max verbal cues; mod A to return to supine with mod A to lift LLE and position trunk    Transfers Overall transfer level: Needs assistance Equipment used: 2 person hand held assist Transfers: Sit to/from Stand Sit to Stand: Mod assist;+2 physical assistance    General transfer comment: mod A +2 from elevated gurney, heavy steadying assistance, L foot requires physical placement assistance  Ambulation/Gait Ambulation/Gait assistance: Max assist;+2 physical assistance  Assistive device: 2 person hand held assist Gait Pattern/deviations: Step-to pattern;Trunk flexed Gait velocity: decreased   General Gait Details: chair placed anterior to pt as RW, max A +2 on either side of pt for 3 sidesteps up to [facility], physical assist to clear L foot and position appropriately with tendency to externally rotate LLE instead of abduct  Stairs            Wheelchair Mobility    Modified Rankin (Stroke Patients Only)       Balance Overall balance assessment: Needs assistance Sitting-balance support: Single extremity supported Sitting balance-Leahy Scale: Poor Sitting balance - Comments: both feet unable to reach ground on gurney, reliant on UE support     Standing balance-Leahy Scale: Zero Standing balance comment: mod-max A with BUE support        Pertinent Vitals/Pain Pain Assessment: 0-10  Pain Score: 10-Worst pain ever Pain Descriptors / Indicators: Constant Pain Intervention(s): Limited activity within patient's tolerance;Monitored during session    Home Living Family/patient expects to be discharged to:: Private  residence Living Arrangements: Children Available Help at Discharge: Family;Available PRN/intermittently Type of Home: House Home Access: Ramped entrance     Home Layout: One level Home Equipment: Shower seat - built in;Grab bars - tub/shower;Bedside commode;Wheelchair - [employer] - 4 wheels      Prior Function Level of Independence: Independent with assistive device(s)         Comments: Pt reports independent with rollator walker, bathing and dressing, cooking and cleaning. Pt reports 2 falls yesterday and none prior for 6 months. Pt reports daughter provides transportation.     Hand Dominance        Extremity/Trunk Assessment        Lower Extremity Assessment Lower Extremity Assessment: LLE deficits/detail LLE Deficits / Details: knee AROM ~25%, hip flexion ~50% with external rotation, able to abduct in supine but difficulty in standing, ankle AROM <25% - pt states normal from surgery LLE Sensation: WNL LLE Coordination: decreased gross motor       Communication   Communication: No difficulties  Cognition Arousal/Alertness: Awake/alert Behavior During Therapy: WFL for tasks assessed/performed Overall Cognitive Status: Within Functional Limits for tasks assessed         General Comments      Exercises     Assessment/Plan    PT Assessment Patient needs continued PT services  PT Problem List Decreased strength;Decreased range of motion;Decreased activity tolerance;Decreased balance;Decreased mobility;Pain       PT Treatment Interventions DME instruction;Gait training;Functional mobility training;Therapeutic activities;Therapeutic exercise;Balance training;Patient/family education    PT Goals (Current goals can be found in the Care Plan section)  Acute Rehab PT Goals Patient Stated Goal: open to rehab prior to return home PT Goal Formulation: With patient Time For Goal Achievement: 02/22/21 Potential to Achieve Goals: Good    Frequency Min  2X/week   Barriers to discharge        Co-evaluation               AM-PAC PT "6 Clicks" Mobility  Outcome Measure Help needed turning from your back to your side while in a flat bed without using bedrails?: Total Help needed moving from lying on your back to sitting on the side of a flat bed without using bedrails?: Total Help needed moving to and from a bed to a chair (including a wheelchair)?: Total Help needed standing up from a chair using your arms (e.g., wheelchair or bedside chair)?: Total Help needed to walk in hospital room?: Total Help needed climbing 3-5 steps with a railing? : Total 6 Click Score: 6    End of Session Equipment Utilized During Treatment: Gait belt Activity Tolerance: Patient tolerated treatment well;Patient limited by pain Patient left: in bed;with call bell/phone within reach Nurse Communication: Mobility status PT Visit Diagnosis: Unsteadiness on feet (R26.81);Other abnormalities of gait and mobility (R26.89);Muscle weakness (generalized) (M62.81);Pain Pain - part of body:  (LBP)    Time: 8299-3716 PT Time Calculation (min) (ACUTE ONLY): 27 min   Charges:   PT Evaluation $PT Eval Moderate Complexity: 1 Mod PT Treatments $Therapeutic Activity: 8-22 mins         Tori Deliliah Spranger PT, DPT 02/08/21, 1:35 PM

## 2021-02-08 NOTE — ED Provider Notes (Signed)
  Physical Exam  BP (!) 172/86   Pulse (!) 51   Temp 98.8 F (37.1 C) (Oral)   Resp 16   Ht 5\' 7"  (1.702 m)   Wt 70.3 kg   SpO2 96%   BMI 24.28 kg/m   Physical Exam  ED Course/Procedures     Procedures  MDM  has a longstanding history of low back pain and left-sided radicular symptoms.  He presents with increased weakness and inability to walk.  When I attempted to examine him, I had 2 other people assisting me, and we cannot get him to sit.  He had a lot of truncal weakness.  I examined his leg, and he has a left greater than right leg weakness which appears to be consistent with his prior notes from [place].  He sees the spine center with [facility] but has never had surgery.  I did discuss his lumbar MRI with neurosurgery here, and I do not think his findings on his MRI are either surgical in nature or are consistent with his acute neurologic presentation.  They did recommend thoracic MRI.  Thoracic MRI is not indicative of acute pathology.  He has been seen and assessed by PT who do recommend skilled nursing placement.       [place], MD 02/08/21 1420

## 2021-02-08 NOTE — ED Notes (Signed)
Patient transported to MRI 

## 2021-02-08 NOTE — Progress Notes (Addendum)
TOC CM received confirmation from [facility], they accepted referral. Will start [facility] Medicare auth for SNF, reference number # [id number]. Isidoro Donning RN CCM, [facility] ED TOC CM [phone]

## 2021-02-08 NOTE — ED Notes (Signed)
Per Garald Balding RN, several staff and EDP went in to reassess Pt.  Pt was unable to even set up on the side of the bed.

## 2021-02-08 NOTE — Progress Notes (Signed)
.  Transition of Care [facility]) - Emergency Department Mini Assessment   Patient Details  Name: Luis Poole MRN: [id number] Date of Birth: August 05, 1941  [facility]) CM/SW Contact:    Larrie Kass, LCSW Phone Number: 02/08/2021, 3:39 PM   Clinical Narrative: CSW spoke with patient about SNF placement. Patient agreed with recommendations , CSW provided choice, patient wanted to go to [facility] nursing home. CSW completed FL2 and fax referrals to SNF placements. Patient reported he lives with his sister and daughter. Patient reported no DME needs when he returns home. CSW continue to follow.     Valentina Shaggy.Hatsue Sime, MSW, LCSWA [facility]  Transitions of Care Clinical Social Worker I Direct Dial: [phone]  Fax: [phone] [email or web address]    ED Mini Assessment: What brought you to the Emergency Department? : Leg weakness  Barriers to Discharge: No SNF bed        Interventions which prevented an admission or readmission: SNF Placement    Patient Contact and Communications        ,          Patient states their goals for this hospitalization and ongoing recovery are:: ongoing goal to get stronger legs weak. CMS Medicare.gov Compare Post Acute Care list provided to:: Patient Choice offered to / list presented to : Patient  Admission diagnosis:  Leg Pain Patient Active Problem List   Diagnosis Date Noted   Syncope 10/06/2017   Chest pain 10/06/2017   Low back pain 06/28/2017   Leg swelling 08/23/2013   Left leg swelling 08/23/2013   Neuropathy 08/23/2013   Acute gout 08/23/2013   D-dimer, elevated 08/23/2013   CKD (chronic kidney disease), stage III (HCC) 08/23/2013   Chronic diastolic heart failure (HCC) 08/23/2013   Anemia 06/08/2013   Hypertension 08/29/2012   Diabetes mellitus type 2 in nonobese (HCC) 08/29/2012   History of CVA (cerebrovascular accident) 08/28/2012    Class: Acute   L-S  radiculopathy 08/28/2012   Lumbar post-laminectomy syndrome 01/15/2012   Thoracic or lumbosacral neuritis or radiculitis, unspecified 01/15/2012   Left hemiparesis (HCC) 01/15/2012   Diabetic peripheral neuropathy (HCC) 01/15/2012   PCP:  Tracey Harries, MD Pharmacy:   [facility] [phone] [place], [place] - [address] AT [facility] OF [address] & [facility] [address] [place] [address] Phone: [phone] Fax: [phone]  [facility] [place], [place] - [address] AT [employer] [address] [place] [address] Phone: [phone] Fax: [phone]

## 2021-02-08 NOTE — ED Notes (Signed)
Pt reported to Pt that he would be ok with going to [facility] for rehab.

## 2021-02-08 NOTE — Progress Notes (Signed)
PT Cancellation Note  Patient Details Name: TENZIN EDELMAN MRN: [id number] DOB: 1941/03/14   Cancelled Treatment:    Reason Eval/Treat Not Completed: Patient not medically ready. Noted in chart review pt with new truncal weakness, thoracic MRI ordered. Will await results for PT eval.    Tori Jenisis Harmsen PT, DPT 02/08/21, 10:45 AM

## 2021-02-08 NOTE — ED Notes (Signed)
PT finishing up at bedside.

## 2021-02-08 NOTE — ED Notes (Signed)
Placed call for PT per Alaina's request for this patient spoke to Tori in PT at 12:31 pm

## 2021-02-08 NOTE — ED Provider Notes (Signed)
[facility]ENCY [facility]PT [facility] Provider Note: Luis Dell, MD, FACEP  CSN: [id number] MRN: [id number] ARRIVAL: 02/07/21 at 2125 ROOM: RESB/RESB   CHIEF COMPLAINT  Extremity Weakness   HISTORY OF PRESENT ILLNESS  02/08/21 2:12 AM Luis Poole is a 80 y.o. male with a history of CVA and peripheral neuropathy.  He states he is being considered for a lumbar spine stimulator.  He is here with an acute worsening of pain and weakness in his lower extremities.  He states his legs are so weak he is unable to stand or walk.  He rates his pain as a 7 out of 10, worse with movement.  He has chronic left lower extremity edema.  He is having paresthesias in his legs as well.   Past Medical History:  Diagnosis Date   Anemia, unspecified 06/08/2013   Cataract    CHF (congestive heart failure) (HCC)    Chronic kidney disease    Diabetes mellitus    Hypertension    S/P lumbar fusion    Stroke [facility])     Past Surgical History:  Procedure Laterality Date   CATARACT EXTRACTION     EYE SURGERY     KNEE SURGERY  2006   NECK SURGERY  2012   SPINE SURGERY  2009   TONSILECTOMY, ADENOIDECTOMY, BILATERAL MYRINGOTOMY AND TUBES      Family History  Problem Relation Age of Onset   Stroke Mother     Social History   Tobacco Use   Smoking status: Former    Pack years: 0.00   Smokeless tobacco: Former  Building services engineer Use: Never used  Substance Use Topics   Alcohol use: No   Drug use: No    Prior to Admission medications   Medication Sig Start Date End Date Taking? Authorizing Provider  amLODipine (NORVASC) 10 MG tablet Take 10 mg by mouth daily.    Yes [provider]  aspirin EC 81 MG tablet Take 81 mg by mouth daily.   Yes [provider]  atorvastatin (LIPITOR) 20 MG tablet Take 1 tablet (20 mg total) by mouth daily. 10/09/17  Yes Burnadette Pop, MD  colchicine 0.6 MG tablet Take 1 tablet (0.6 mg total) daily by mouth. Patient taking differently: Take 0.6 mg by  mouth daily as needed (gout pain). 06/29/17  Yes Maxie Barb, MD  ferrous sulfate 325 (65 FE) MG tablet Take 325 mg by mouth daily with breakfast.   Yes [provider]  furosemide (LASIX) 20 MG tablet Take 1 tablet (20 mg total) by mouth daily. 09/24/17  Yes Upstill, Melvenia Beam, PA-C  Omega-3 Fatty Acids (FISH OIL) 1000 MG CAPS Take 1,000 mg by mouth daily.   Yes [provider]  pregabalin (LYRICA) 100 MG capsule Take 100 mg by mouth 2 (two) times daily.   Yes [provider]  valsartan (DIOVAN) 160 MG tablet Take 160 mg daily by mouth.   Yes [provider]    Allergies Ibuprofen, Lisinopril, Pregabalin, and Gabapentin   REVIEW OF SYSTEMS  Negative except as noted here or in the History of Present Illness.   PHYSICAL EXAMINATION  Initial Vital Signs Blood pressure (!) 152/77, pulse (!) 57, temperature 98.8 F (37.1 C), temperature source Oral, resp. rate 20, height 5\' 7"  (1.702 m), weight 70.3 kg, SpO2 100 %.  Examination General: Well-developed, well-nourished male in no acute distress; appearance consistent with age of record HENT: normocephalic; atraumatic Eyes: pupils equal, round and reactive to light;  extraocular muscles intact Neck: supple Heart: regular rate and rhythm Lungs: clear to auscultation bilaterally Abdomen: soft; nondistended; nontender; bowel sounds present Extremities: No deformity; full range of motion; chronic appearing edema of left lower extremity Neurologic: Awake, alert and oriented; motor strength plus 4 out of 5 in lower extremities; subjectively altered sensation in lower extremities;; no facial droop Skin: Warm and dry Psychiatric: Normal mood and affect   RESULTS  Summary of this visit's results, reviewed and interpreted by myself:   EKG Interpretation  Date/Time:    Ventricular Rate:    PR Interval:    QRS Duration:   QT Interval:    QTC Calculation:   R Axis:     Text Interpretation:           Laboratory Studies: Results for orders placed or performed during the hospital encounter of 02/07/21 (from the past 24 hour(s))  Comprehensive metabolic panel     Status: Abnormal   Collection Time: 02/07/21 11:20 PM  Result Value Ref Range   Sodium 138 135 - 145 mmol/L   Potassium 4.0 3.5 - 5.1 mmol/L   Chloride 105 98 - 111 mmol/L   CO2 25 22 - 32 mmol/L   Glucose, Bld 121 (H) 70 - 99 mg/dL   BUN 22 8 - 23 mg/dL   Creatinine, Ser 9.14 0.61 - 1.24 mg/dL   Calcium 9.1 8.9 - 78.2 mg/dL   Total Protein 8.0 6.5 - 8.1 g/dL   Albumin 4.0 3.5 - 5.0 g/dL   AST 22 15 - 41 U/L   ALT 13 0 - 44 U/L   Alkaline Phosphatase 76 38 - 126 U/L   Total Bilirubin 0.5 0.3 - 1.2 mg/dL   GFR, Estimated >95 >62 mL/min   Anion gap 8 5 - 15  CBC with Differential     Status: Abnormal   Collection Time: 02/07/21 11:20 PM  Result Value Ref Range   WBC 5.0 4.0 - 10.5 K/uL   RBC 4.12 (L) 4.22 - 5.81 MIL/uL   Hemoglobin 11.6 (L) 13.0 - 17.0 g/dL   HCT 13.0 (L) 86.5 - 78.4 %   MCV 87.9 80.0 - 100.0 fL   MCH 28.2 26.0 - 34.0 pg   MCHC 32.0 30.0 - 36.0 g/dL   RDW 69.6 29.5 - 28.4 %   Platelets 204 150 - 400 K/uL   nRBC 0.0 0.0 - 0.2 %   Neutrophils Relative % 47 %   Neutro Abs 2.3 1.7 - 7.7 K/uL   Lymphocytes Relative 42 %   Lymphs Abs 2.1 0.7 - 4.0 K/uL   Monocytes Relative 8 %   Monocytes Absolute 0.4 0.1 - 1.0 K/uL   Eosinophils Relative 3 %   Eosinophils Absolute 0.1 0.0 - 0.5 K/uL   Basophils Relative 0 %   Basophils Absolute 0.0 0.0 - 0.1 K/uL   Immature Granulocytes 0 %   Abs Immature Granulocytes 0.01 0.00 - 0.07 K/uL  Urinalysis, Routine w reflex microscopic Urine, Catheterized     Status: Abnormal   Collection Time: 02/08/21  1:30 AM  Result Value Ref Range   Color, Urine STRAW (A) YELLOW   APPearance CLEAR CLEAR   Specific Gravity, Urine 1.008 1.005 - 1.030   pH 6.0 5.0 - 8.0   Glucose, UA NEGATIVE NEGATIVE mg/dL   Hgb urine dipstick SMALL (A) NEGATIVE   Bilirubin Urine NEGATIVE  NEGATIVE   Ketones, ur NEGATIVE NEGATIVE mg/dL   Protein, ur NEGATIVE NEGATIVE mg/dL   Nitrite NEGATIVE NEGATIVE  Leukocytes,Ua NEGATIVE NEGATIVE   RBC / HPF 0-5 0 - 5 RBC/hpf   WBC, UA 0-5 0 - 5 WBC/hpf   Bacteria, UA NONE SEEN NONE SEEN   Squamous Epithelial / LPF 0-5 0 - 5  Resp Panel by RT-PCR (Flu A&B, Covid) Nasopharyngeal Swab     Status: None   Collection Time: 02/08/21  2:45 PM   Specimen: Nasopharyngeal Swab; Nasopharyngeal(NP) swabs in vial transport medium  Result Value Ref Range   SARS Coronavirus 2 by RT PCR NEGATIVE NEGATIVE   Influenza A by PCR NEGATIVE NEGATIVE   Influenza B by PCR NEGATIVE NEGATIVE   Imaging Studies: DG Lumbar Spine Complete  Result Date: 02/07/2021 CLINICAL DATA:  Fall unable to bear weight EXAM: LUMBAR SPINE - COMPLETE 4+ VIEW COMPARISON:  06/27/2017 FINDINGS: Posterior fusion hardware at L5-S1 with interbody device. Stable intact appearance of the hardware. The vertebral body heights are maintained. Multilevel degenerative change most notable at L2-L3 and L3-L4 with progression of degenerative changes. IMPRESSION: 1. Stable appearance of L5-S1 posterior fusion hardware 2. Progression of degenerative changes at L2-L3 and L3-L4. Electronically Signed   By: Jasmine PangKim  Fujinaga M.D.   On: 02/07/2021 22:39   MR THORACIC SPINE WO CONTRAST  Result Date: 02/08/2021 CLINICAL DATA:  Mid back pain.  Neuro deficit. EXAM: MRI THORACIC SPINE WITHOUT CONTRAST TECHNIQUE: Multiplanar, multisequence MR imaging of the thoracic spine was performed. No intravenous contrast was administered. COMPARISON:  Thoracic spine radiographs 01/03/2005 FINDINGS: Alignment:  Normal alignment.  Moderate thoracic kyphosis. Vertebrae: Negative for fracture or mass. Cord: Cord evaluation limited by motion however no cord signal abnormality is identified. Paraspinal and other soft tissues: Negative for paraspinous mass or edema. No pleural effusion. Disc levels: Disc degeneration in the midthoracic  spine between T6 and T11 with disc space narrowing and Schmorl's nodes. No focal disc protrusion or spinal stenosis. Mild vertebral endplate spurring at T7-8, T8-9, T9-10. IMPRESSION: Extension weighted thoracic kyphosis. Multilevel disc degeneration. No significant stenosis in the thoracic spine Negative for fracture. Electronically Signed   By: Marlan Palauharles  Clark M.D.   On: 02/08/2021 11:27   MR LUMBAR SPINE WO CONTRAST  Result Date: 02/08/2021 CLINICAL DATA:  Low back pain. EXAM: MRI LUMBAR SPINE WITHOUT CONTRAST TECHNIQUE: Multiplanar, multisequence MR imaging of the lumbar spine was performed. No intravenous contrast was administered. COMPARISON:  MRI June 28 2017. lumbar radiographs February 07, 2021. FINDINGS: Due to patient motion fast MRI imaging was used, limiting evaluation. Segmentation: Standard segmentation is assumed. Alignment:  Similar grade 1 anterolisthesis of L5 on S1 Vertebrae: Discogenic/degenerative endplate signal changes at L5-S1. No focal marrow edema to suggest acute fracture or discitis/osteomyelitis. No suspicious bone lesion. Conus medullaris and cauda equina: Conus extends to the L1 level. The conus appears unremarkable, although evaluation is somewhat limited by motion. Paraspinal and other soft tissues: Postoperative changes in the lower posterior paraspinal soft tissues. Disc levels: T12-L1: No significant disc protrusion, foraminal stenosis, or canal stenosis. L1-L2: Mild disc bulging and facet hypertrophy without significant canal stenosis or foraminal image mint. L2-L3: Progressive disc height loss. Disc bulging with moderate right greater than left facet hypertrophy and ligamentum flavum thickening. Mild to moderate right greater than left foraminal stenosis, which is likely similar. Mild canal and subarticular recess stenosis. L3-L4: Progressive disc height loss. Broad disc bulge with mild-to-moderate bilateral facet hypertrophy. Similar superimposed right far  lateral/extraforaminal disc protrusion which contacts the exiting/exited right L3 nerve (series 6, image 6). Similar mild canal stenosis. L4-L5: Mild broad disc bulge  and moderate bilateral facet hypertrophy. Prior posterior decompression. No significant canal stenosis moderate bilateral foraminal stenosis, likely similar to prior L5-S1: Similar grade 1 anterolisthesis of L5 on S1. Mild disc bulge with moderate bilateral facet hypertrophy. Similar mild-to-moderate bilateral foraminal stenosis. No significant canal stenosis. IMPRESSION: 1. Motion limited evaluation with likely similar mild to moderate bilateral foraminal stenosis at L2-L3 and L4-L5. Similar right far lateral/extraforaminal disc protrusion at L3-L4, contacting the exiting/exited right L3 nerve. 2. Mild progression of degenerative disc disease at L2-L3 and L3-L4 with similar mild canal stenosis at these levels. Electronically Signed   By: Feliberto Harts MD   On: 02/08/2021 07:15    ED COURSE and MDM  Nursing notes, initial and subsequent vitals signs, including pulse oximetry, reviewed and interpreted by myself.  Vitals:   02/08/21 1345 02/08/21 1745 02/08/21 1830 02/08/21 1900  BP: (!) 155/77 (!) 165/94 (!) 164/72 (!) 148/74  Pulse: (!) 55 (!) 52 (!) 56 (!) 53  Resp: 19 18 16 17   Temp:      TempSrc:      SpO2: 95% 99% 96% 98%  Weight:      Height:       Medications  acetaminophen (TYLENOL) tablet 650 mg (650 mg Oral Given 02/08/21 2018)  ondansetron (ZOFRAN) tablet 4 mg (has no administration in time range)  alum & mag hydroxide-simeth (MAALOX/MYLANTA) 200-200-20 MG/5ML suspension 30 mL (has no administration in time range)  aspirin EC tablet 81 mg (81 mg Oral Given 02/08/21 1601)  atorvastatin (LIPITOR) tablet 20 mg (20 mg Oral Given 02/08/21 1602)  colchicine tablet 0.6 mg (has no administration in time range)  ferrous sulfate tablet 325 mg (has no administration in time range)  furosemide (LASIX) tablet 20 mg (20 mg Oral  Given 02/08/21 1601)  omega-3 acid ethyl esters (LOVAZA) capsule 1 g (has no administration in time range)  irbesartan (AVAPRO) tablet 150 mg (has no administration in time range)  amLODipine (NORVASC) tablet 10 mg (has no administration in time range)  pregabalin (LYRICA) capsule 100 mg (100 mg Oral Given 02/08/21 2130)  LORazepam (ATIVAN) injection 1-2 mg (2 mg Intravenous Given 02/08/21 0545)    3:04 AM Will obtain MRI of the lumbar spine to assess for acute changes.  His last lumbar MRI was in 2018.  PROCEDURES  Procedures   ED DIAGNOSES     ICD-10-CM   1. Left hemiparesis (HCC)  G81.94     2. L-S radiculopathy  M54.17     3. Weakness  R53.1          Brysun Eschmann, 2019, MD 02/08/21 2236

## 2021-02-08 NOTE — ED Notes (Signed)
Pt back from MRI.  Asking for something to eat and drink.  Informed we are awaiting results.  PT has not evaluated yet.  Pt reports he gets Home Health and will not consider going into a rehab facility.

## 2021-02-08 NOTE — ED Notes (Signed)
Patient back from MRI.

## 2021-02-09 NOTE — NC FL2 (Signed)
[place] MEDICAID FL2 LEVEL OF CARE SCREENING TOOL     IDENTIFICATION  Patient Name: Luis Poole Birthdate: 05-Dec-1940 Sex: male Admission Date (Current Location): 02/07/2021  [facility] and [place] Number:  Producer, television/film/video and Address:  [facility],  [address] [place], [place] [address]      Provider Number: [id number]  Attending Physician Name and Address:  Default, Provider, MD  Relative Name and Phone Number:  Yolande Jolly (Sister)   [phone]    Current Level of Care: Hospital Recommended Level of Care: Skilled Nursing Facility Prior Approval Number:    Date Approved/Denied:   PASRR Number: [id number]  Discharge Plan: SNF    Current Diagnoses: Patient Active Problem List   Diagnosis Date Noted   Syncope 10/06/2017   Chest pain 10/06/2017   Low back pain 06/28/2017   Leg swelling 08/23/2013   Left leg swelling 08/23/2013   Neuropathy 08/23/2013   Acute gout 08/23/2013   D-dimer, elevated 08/23/2013   CKD (chronic kidney disease), stage III (HCC) 08/23/2013   Chronic diastolic heart failure (HCC) 08/23/2013   Anemia 06/08/2013   Hypertension 08/29/2012   Diabetes mellitus type 2 in nonobese (HCC) 08/29/2012   History of CVA (cerebrovascular accident) 08/28/2012   L-S radiculopathy 08/28/2012   Lumbar post-laminectomy syndrome 01/15/2012   Thoracic or lumbosacral neuritis or radiculitis, unspecified 01/15/2012   Left hemiparesis (HCC) 01/15/2012   Diabetic peripheral neuropathy (HCC) 01/15/2012    Orientation RESPIRATION BLADDER Height & Weight     Self, Time, Situation, Place  Normal Continent Weight: 70.3 kg Height:  5\' 7"  (170.2 cm)  BEHAVIORAL SYMPTOMS/MOOD NEUROLOGICAL BOWEL NUTRITION STATUS      Continent Diet (heart healthy/carb modified)  AMBULATORY STATUS COMMUNICATION OF NEEDS Skin   Extensive Assist Verbally Normal                       Personal Care Assistance Level of Assistance  Bathing, Feeding, Dressing  Bathing Assistance: Independent Feeding assistance: Independent Dressing Assistance: Limited assistance     Functional Limitations Info  Sight, Hearing, Speech Sight Info: Adequate Hearing Info: Adequate Speech Info: Adequate    SPECIAL CARE FACTORS FREQUENCY  PT (By licensed PT), OT (By licensed OT)     PT Frequency: 5x per week OT Frequency: 5x per week            Contractures Contractures Info: Not present    Additional Factors Info  Code Status, Allergies Code Status Info: Full Allergies Info: Ibuprofen, Lisinopril, Pregabalin, Gabapentin           Current Medications (02/09/2021):  This is the current hospital active medication list Current Facility-Administered Medications  Medication Dose Route Frequency Provider Last Rate Last Admin   acetaminophen (TYLENOL) tablet 650 mg  650 mg Oral Q4H PRN 02/11/2021, MD   650 mg at 02/08/21 2018   alum & mag hydroxide-simeth (MAALOX/MYLANTA) 200-200-20 MG/5ML suspension 30 mL  30 mL Oral Q6H PRN 02-25-2001, MD       amLODipine (NORVASC) tablet 10 mg  10 mg Oral Daily Koleen Distance, MD       aspirin EC tablet 81 mg  81 mg Oral Daily Koleen Distance, MD   81 mg at 02/08/21 1601   atorvastatin (LIPITOR) tablet 20 mg  20 mg Oral Daily 02/10/21, MD   20 mg at 02/08/21 02/10/21   colchicine tablet 0.6 mg  0.6 mg Oral Daily PRN 0223,  MD       ferrous sulfate tablet 325 mg  325 mg Oral Q breakfast Koleen Distance, MD       furosemide (LASIX) tablet 20 mg  20 mg Oral Daily Koleen Distance, MD   20 mg at 02/08/21 1601   irbesartan (AVAPRO) tablet 150 mg  150 mg Oral Daily Koleen Distance, MD       omega-3 acid ethyl esters (LOVAZA) capsule 1 g  1 g Oral Daily Koleen Distance, MD       ondansetron [facility]) tablet 4 mg  4 mg Oral Q8H PRN Koleen Distance, MD       pregabalin (LYRICA) capsule 100 mg  100 mg Oral BID Koleen Distance, MD   100 mg at 02/08/21 2130   Current Outpatient Medications  Medication Sig Dispense  Refill   amLODipine (NORVASC) 10 MG tablet Take 10 mg by mouth daily.      aspirin EC 81 MG tablet Take 81 mg by mouth daily.     atorvastatin (LIPITOR) 20 MG tablet Take 1 tablet (20 mg total) by mouth daily. 30 tablet 0   colchicine 0.6 MG tablet Take 1 tablet (0.6 mg total) daily by mouth. (Patient taking differently: Take 0.6 mg by mouth daily as needed (gout pain).) 30 tablet 0   ferrous sulfate 325 (65 FE) MG tablet Take 325 mg by mouth daily with breakfast.     furosemide (LASIX) 20 MG tablet Take 1 tablet (20 mg total) by mouth daily. 3 tablet 0   Omega-3 Fatty Acids (FISH OIL) 1000 MG CAPS Take 1,000 mg by mouth daily.     pregabalin (LYRICA) 100 MG capsule Take 100 mg by mouth 2 (two) times daily.     valsartan (DIOVAN) 160 MG tablet Take 160 mg daily by mouth.       Discharge Medications: Please see discharge summary for a list of discharge medications.  Relevant Imaging Results:  Relevant Lab Results:   Additional Information SSN [id number], full vaccinated  Elliot Cousin, RN

## 2021-02-09 NOTE — ED Notes (Signed)
Called [facility] for transport.  

## 2021-02-09 NOTE — Progress Notes (Addendum)
TOC CM received approval from [facility], pt approved for 6/24-6/28, next review date on 02/13/2021, reference # [id number]. UR CM, Barbara Cower. Notified [facility] rep, Janie. States she needs family to come and sign pt in today. Contacted pt's sister and states she will ride to facility to sign paperwork. Scanned AVS to [facility]. Isidoro Donning RN CCM, [facility] ED TOC CM [phone]   ROOM NUMBER 201 MAIN NUMBER FOR REPORT [phone]

## 2021-02-09 NOTE — ED Notes (Signed)
Attempted to call report to [facility] with no answer

## 2021-02-09 NOTE — Progress Notes (Addendum)
TOC CSW received a call from [facility] with pts reference # [id number] and the fax # [phone] to fax clinicals.  Naydeline Morace Tarpley-Carter, MSW, LCSW-A Pronouns:  She, Her, Hers                  [facility] ED Transitions of CareClinical Social Worker [email or web address] [phone]

## 2021-02-09 NOTE — Progress Notes (Addendum)
CSW Faxed [employer] clinical information for prior authorization for SNF. CSW continue to follow.   Valentina Shaggy.Tc Kapusta, MSW, LCSWA [facility]  Transitions of Care Clinical Social Worker I Direct Dial: [phone]  Fax: [phone] [email or web address]

## 2021-04-22 ENCOUNTER — Emergency Department (HOSPITAL_BASED_OUTPATIENT_CLINIC_OR_DEPARTMENT_OTHER): Payer: Medicare Other

## 2021-04-22 ENCOUNTER — Emergency Department (HOSPITAL_BASED_OUTPATIENT_CLINIC_OR_DEPARTMENT_OTHER)
Admission: EM | Admit: 2021-04-22 | Discharge: 2021-04-22 | Disposition: A | Discharge status: Home / Self Care | Payer: Medicare Other | Attending: Emergency Medicine | Admitting: Emergency Medicine

## 2021-04-22 ENCOUNTER — Emergency Department (HOSPITAL_COMMUNITY)
Admission: EM | Admit: 2021-04-22 | Discharge: 2021-04-22 | Disposition: A | Discharge status: Home / Self Care | Payer: Medicare Other

## 2021-04-22 ENCOUNTER — Encounter (HOSPITAL_BASED_OUTPATIENT_CLINIC_OR_DEPARTMENT_OTHER): Payer: Self-pay | Admitting: Emergency Medicine

## 2021-04-22 DIAGNOSIS — N183 Chronic kidney disease, stage 3 unspecified: Secondary | ICD-10-CM | POA: Insufficient documentation

## 2021-04-22 DIAGNOSIS — I5032 Chronic diastolic (congestive) heart failure: Secondary | ICD-10-CM | POA: Diagnosis not present

## 2021-04-22 DIAGNOSIS — Z79899 Other long term (current) drug therapy: Secondary | ICD-10-CM | POA: Insufficient documentation

## 2021-04-22 DIAGNOSIS — E1142 Type 2 diabetes mellitus with diabetic polyneuropathy: Secondary | ICD-10-CM | POA: Diagnosis not present

## 2021-04-22 DIAGNOSIS — G8929 Other chronic pain: Secondary | ICD-10-CM | POA: Insufficient documentation

## 2021-04-22 DIAGNOSIS — M545 Low back pain, unspecified: Secondary | ICD-10-CM | POA: Diagnosis present

## 2021-04-22 DIAGNOSIS — G63 Polyneuropathy in diseases classified elsewhere: Secondary | ICD-10-CM

## 2021-04-22 DIAGNOSIS — E1122 Type 2 diabetes mellitus with diabetic chronic kidney disease: Secondary | ICD-10-CM | POA: Diagnosis not present

## 2021-04-22 DIAGNOSIS — Z7982 Long term (current) use of aspirin: Secondary | ICD-10-CM | POA: Diagnosis not present

## 2021-04-22 DIAGNOSIS — Z87891 Personal history of nicotine dependence: Secondary | ICD-10-CM | POA: Insufficient documentation

## 2021-04-22 DIAGNOSIS — I130 Hypertensive heart and chronic kidney disease with heart failure and stage 1 through stage 4 chronic kidney disease, or unspecified chronic kidney disease: Secondary | ICD-10-CM | POA: Insufficient documentation

## 2021-04-22 DIAGNOSIS — M5441 Lumbago with sciatica, right side: Secondary | ICD-10-CM | POA: Diagnosis not present

## 2021-04-22 LAB — CBC WITH DIFFERENTIAL/PLATELET
Abs Immature Granulocytes: 0.03 10*3/uL (ref 0.00–0.07)
Basophils Absolute: 0 10*3/uL (ref 0.0–0.1)
Basophils Relative: 1 %
Eosinophils Absolute: 0.2 10*3/uL (ref 0.0–0.5)
Eosinophils Relative: 2 %
HCT: 33.9 % — ABNORMAL LOW (ref 39.0–52.0)
Hemoglobin: 10.7 g/dL — ABNORMAL LOW (ref 13.0–17.0)
Immature Granulocytes: 0 %
Lymphocytes Relative: 31 %
Lymphs Abs: 2.5 10*3/uL (ref 0.7–4.0)
MCH: 27.2 pg (ref 26.0–34.0)
MCHC: 31.6 g/dL (ref 30.0–36.0)
MCV: 86 fL (ref 80.0–100.0)
Monocytes Absolute: 0.8 10*3/uL (ref 0.1–1.0)
Monocytes Relative: 10 %
Neutro Abs: 4.7 10*3/uL (ref 1.7–7.7)
Neutrophils Relative %: 56 %
Platelets: 206 10*3/uL (ref 150–400)
RBC: 3.94 MIL/uL — ABNORMAL LOW (ref 4.22–5.81)
RDW: 15.1 % (ref 11.5–15.5)
WBC: 8.3 10*3/uL (ref 4.0–10.5)
nRBC: 0 % (ref 0.0–0.2)

## 2021-04-22 LAB — BASIC METABOLIC PANEL WITH GFR
Anion gap: 7 (ref 5–15)
BUN: 20 mg/dL (ref 8–23)
CO2: 27 mmol/L (ref 22–32)
Calcium: 8.9 mg/dL (ref 8.9–10.3)
Chloride: 103 mmol/L (ref 98–111)
Creatinine, Ser: 1.14 mg/dL (ref 0.61–1.24)
GFR, Estimated: >60 mL/min
Glucose, Bld: 93 mg/dL (ref 70–99)
Potassium: 4.3 mmol/L (ref 3.5–5.1)
Sodium: 137 mmol/L (ref 135–145)

## 2021-04-22 LAB — HEMOGLOBIN A1C
Hgb A1c MFr Bld: 5.9 % — ABNORMAL HIGH (ref 4.8–5.6)
Mean Plasma Glucose: 122.63 mg/dL

## 2021-04-22 LAB — VITAMIN B12: Vitamin B-12: 217 pg/mL (ref 180–914)

## 2021-04-22 LAB — MAGNESIUM: Magnesium: 2 mg/dL (ref 1.7–2.4)

## 2021-04-22 LAB — CBG MONITORING, ED: Glucose-Capillary: 73 mg/dL (ref 70–99)

## 2021-04-22 NOTE — ED Notes (Signed)
[name] is the daughter to be contacted

## 2021-04-22 NOTE — ED Triage Notes (Signed)
Pt arrives from home via EMS. Pt reports that he sat in his wheelchair too long yesterday and his bilateral legs are so sore today that he cannot stand. Pt has a brace on his right leg. Pt reports that he also has decreased sensation in his right foot.

## 2021-04-22 NOTE — ED Provider Notes (Signed)
[facility] EMERGENCY DEPT Provider Note   CSN: [id number] Arrival date & time: 04/22/21  1530     History Chief Complaint  Patient presents with   Leg Pain    Luis Poole is a 80 y.o. male.   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  Additionally, the patient states that he has developed new paresthesias in his lower extremities with numbness in his bilateral lower extremities in a stocking distribution.  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  He has previously been referred for consideration for a spinal stimulator to neurosurgery but has not followed up in clinic.   He states that he had 1 episode of urinary incontinence on standing this morning.  He states that he has since had episodes where he has been able to urinate on his own.  He denies any saddle anesthesia.  He denies any significant worsening of his radicular pain and weakness.  He denies any fevers or chills.  He denies any recent falls.  He states that his last fall was over 3 weeks ago.  He currently still lives at home and takes care of all of his own ADLs.  He was last evaluated for left leg weakness, left foot drop, acute left-sided low back pain with left-sided sciatica by his PCP on 04/10/2021.  He has been provided for [place] to physical therapy as well as to pain management due to his chronic symptoms.  He denies any recent IV drug use.   Denies any worsening back pain, fever or chills.  Past Medical History:  Diagnosis Date   Anemia, unspecified 06/08/2013   Cataract    CHF (congestive heart failure) (HCC)    Chronic kidney disease    Diabetes mellitus    Hypertension    S/P lumbar fusion    Stroke [facility])     Patient Active Problem List   Diagnosis Date Noted   Syncope 10/06/2017   Chest pain 10/06/2017   Low back pain 06/28/2017   Leg swelling 08/23/2013   Left leg swelling 08/23/2013   Neuropathy 08/23/2013   Acute gout 08/23/2013   D-dimer, elevated 08/23/2013   CKD (chronic kidney disease), stage III (HCC) 08/23/2013   Chronic diastolic heart failure (HCC) 08/23/2013   Anemia 06/08/2013   Hypertension 08/29/2012   Diabetes mellitus type 2 in nonobese (HCC) 08/29/2012   History of CVA (cerebrovascular accident) 08/28/2012    Class: Acute   L-S radiculopathy 08/28/2012   Lumbar post-laminectomy syndrome 01/15/2012   Thoracic or lumbosacral neuritis or radiculitis, unspecified 01/15/2012   Left hemiparesis (HCC) 01/15/2012   Diabetic peripheral neuropathy (HCC) 01/15/2012    Past Surgical History:  Procedure Laterality Date   CATARACT EXTRACTION     EYE SURGERY     KNEE SURGERY  2006   NECK SURGERY  2012   SPINE SURGERY  2009   TONSILECTOMY, ADENOIDECTOMY, BILATERAL MYRINGOTOMY AND TUBES         Family History  Problem Relation Age  of Onset   Stroke Mother     Social History   Tobacco Use   Smoking status: Former   Smokeless tobacco: Former  Building services engineer Use: Never used  Substance Use Topics   Alcohol use: No   Drug use: No    Home Medications Prior to Admission medications   Medication Sig Start Date End Date Taking? Authorizing Provider  amLODipine (NORVASC) 10 MG tablet Take 10 mg by mouth daily.     [provider]  aspirin EC 81 MG tablet Take 81 mg by mouth daily.    [provider]  atorvastatin (LIPITOR) 20 MG tablet Take 1 tablet (20 mg total)  by mouth daily. 10/09/17   Burnadette Pop, MD  colchicine 0.6 MG tablet Take 1 tablet (0.6 mg total) daily by mouth. Patient taking differently: Take 0.6 mg by mouth daily as needed (gout pain). 06/29/17   Maxie Barb, MD  ferrous sulfate 325 (65 FE) MG tablet Take 325 mg by mouth daily with breakfast.    [provider]  furosemide (LASIX) 20 MG tablet Take 1 tablet (20 mg total) by mouth daily. 09/24/17   Elpidio Anis, PA-C  Omega-3 Fatty Acids (FISH OIL) 1000 MG CAPS Take 1,000 mg by mouth daily.    [provider]  pregabalin (LYRICA) 100 MG capsule Take 100 mg by mouth 2 (two) times daily.    [provider]  valsartan (DIOVAN) 160 MG tablet Take 160 mg daily by mouth.    [provider]    Allergies    Ibuprofen, Lisinopril, Pregabalin, and Gabapentin  Review of Systems   Review of Systems  Constitutional:  Negative for chills and fever.  HENT:  Negative for ear pain and sore throat.   Eyes:  Negative for pain and visual disturbance.  Respiratory:  Negative for cough and shortness of breath.   Cardiovascular:  Negative for chest pain and palpitations.  Gastrointestinal:  Negative for abdominal pain and vomiting.  Genitourinary:  Negative for dysuria and hematuria.  Musculoskeletal:  Positive for arthralgias and back pain.  Skin:  Negative for color change and rash.  Neurological:  Positive for weakness and numbness. Negative for seizures and syncope.  All other systems reviewed and are negative.  Physical Exam Updated Vital Signs BP (!) 159/77   Pulse 63   Temp 98.4 F (36.9 C)   Resp 17   SpO2 100%   Physical Exam Vitals and nursing note reviewed.  Constitutional:      Appearance: He is well-developed.  HENT:     Head: Normocephalic and atraumatic.  Eyes:     Conjunctiva/sclera: Conjunctivae normal.  Cardiovascular:     Rate and Rhythm: Normal rate and regular rhythm.     Heart sounds: No murmur heard. Pulmonary:      Effort: Pulmonary effort is normal. No respiratory distress.     Breath sounds: Normal breath sounds.  Abdominal:     Palpations: Abdomen is soft.     Tenderness: There is no abdominal tenderness.  Musculoskeletal:     Cervical back: Neck supple.  Skin:    General: Skin is warm and dry.  Neurological:     Mental Status: He is alert. Mental status is at baseline.     Cranial Nerves: No cranial nerve deficit.     Motor: Weakness present.     Comments: No cranial nerve deficit.  Bilateral upper extremities with 5 out of 5 strength and intact sensation to  light touch.  Right lower extremity with intact 5 out of 5 strength and intact sensation to light touch.  Left lower extremity with 4 out of 5 strength, intact sensation to light touch.  Patient does endorse some decreased sensation to light touch in a stocking distribution in his bilateral lower extremities.    ED Results / Procedures / Treatments   Labs (all labs ordered are listed, but only abnormal results are displayed) Labs Reviewed  CBC WITH DIFFERENTIAL/PLATELET - Abnormal; Notable for the following components:      Result Value   RBC 3.94 (*)    Hemoglobin 10.7 (*)    HCT 33.9 (*)    All other components within normal limits  HEMOGLOBIN A1C - Abnormal; Notable for the following components:   Hgb A1c MFr Bld 5.9 (*)    All other components within normal limits  BASIC METABOLIC PANEL  MAGNESIUM  VITAMIN B12  CBG MONITORING, ED    EKG None  Radiology CT HEAD WO CONTRAST (5MM)  Result Date: 04/22/2021 CLINICAL DATA:  Stroke, follow-up. EXAM: CT HEAD WITHOUT CONTRAST TECHNIQUE: Contiguous axial images were obtained from the base of the skull through the vertex without intravenous contrast. COMPARISON:  Brain MRI 10/08/2017.  Head CT 10/06/2017 FINDINGS: Brain: Mild generalized cerebral and cerebellar atrophy. Redemonstrated chronic cortical/subcortical infarct medial right frontal and parietal lobes as well as right corpus  callosum (right ACA vascular territory). Background mild patchy and ill-defined hypoattenuation within the cerebral white matter, nonspecific but compatible with chronic small vessel ischemic disease. Lacunar infarct within the left pons, new as compared to the brain MRI of 10/08/2017, but otherwise age indeterminate (series 2, image 9) (series 7, image 30). There is no acute intracranial hemorrhage. No acute demarcated cortical infarct. No extra-axial fluid collection. No evidence of an intracranial mass. No midline shift. Vascular: No hyperdense vessel.  Atherosclerotic calcifications. Skull: Normal. Negative for fracture or focal lesion. Sinuses/Orbits: Visualized orbits show no acute finding. Redemonstrated chronic medially displaced fracture deformities of the lamina papyracea bilaterally. No significant paranasal sinus disease at the imaged levels. Other: Left middle ear/mastoid effusion. IMPRESSION: Lacunar infarct within the left pons, new from the prior brain MRI 10/08/2017, but otherwise age-indeterminate. A brain MRI may be obtained for further evaluation, as clinically warranted. Redemonstrated chronic cortical/subcortical infarct within the right frontal and parietal lobes, and right corpus callosum (right ACA vascular territory). Background mild chronic small-vessel ischemic changes within the cerebral white matter. Mild generalized cerebral and cerebellar atrophy. Left middle ear/mastoid effusion. Electronically Signed   By: Jackey LogeKyle  Golden D.O.   On: 04/22/2021 17:27   CT Lumbar Spine Wo Contrast  Result Date: 04/22/2021 CLINICAL DATA:  Subacute back pain after falling 3 weeks ago. Decreased right foot sensation. Previous back surgery. EXAM: CT LUMBAR SPINE WITHOUT CONTRAST TECHNIQUE: Multidetector CT imaging of the lumbar spine was performed without intravenous contrast administration. Multiplanar CT image reconstructions were also generated. COMPARISON:  Lumbar spine CT 11/08/2011, lumbar MRI  02/08/2021 and radiographs 02/07/2021. FINDINGS: Segmentation: There are 5 lumbar type vertebral bodies. Alignment: Stable fused grade 1 anterolisthesis at L5-S1 status post laminectomy and PLIF. Otherwise normal. Vertebrae: No evidence of acute fracture or traumatic subluxation. Status post L5-S1 laminectomy and PLIF with solid interbody fusion. Endplate degenerative changes at L2-3 have progressed from previous CT with associated vacuum disc phenomenon. No endplate destruction. 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 Paraspinal and other soft tissues: No acute paraspinal abnormalities. Diffuse aortic and branch vessel atherosclerosis. Previous cholecystectomy. Disc  levels: T12-L1: No significant findings. L1-2: Vacuum disc phenomenon with mild disc bulging and facet hypertrophy. No significant spinal stenosis or nerve root encroachment. L2-3: Progressive advanced spondylosis with loss of disc height, vacuum phenomenon, annular disc bulging and endplate osteophytes. There is resulting mild spinal stenosis and mild narrowing of the lateral recesses. Right greater than left foraminal narrowing is present. These findings are similar to the MRI of 3 months ago. L3-4: Loss of disc height with annular disc bulging and moderate facet and ligamentous hypertrophy. Stable mild spinal stenosis. L4-5: Relatively preserved disc height. Mild disc bulging and facet hypertrophy post posterior decompression. Stable mild foraminal narrowing bilaterally. L5-S1. Previous laminectomy and PLIF. The spinal canal is well-decompressed. Stable mild irregular the left chronic osseous foraminal narrowing. IMPRESSION: 1. No evidence of acute fracture or traumatic subluxation. 2. Multilevel spondylosis, progressive from remote CT and similar to MRI performed in June. The spondylosis is greatest at L2-3. See details above. 3. Previous posterior decompression and PLIF at L5-S1 with solid interbody fusion. Electronically Signed    By: Carey Bullocks M.D.   On: 04/22/2021 17:30    Procedures Procedures   Medications Ordered in ED Medications - No data to display  ED Course  I have reviewed the triage vital signs and the nursing notes.  Pertinent labs & imaging results that were available during my care of the patient were reviewed by me and considered in my medical decision making (see chart for details).    MDM Rules/Calculators/A&P                         80 year old male with past medical history as above to include chronic back pain with associated radiculopathy, chronic left lower extremity weakness from prior CVA, chronic peripheral neuropathy associated with diabetes mellitus presenting to the emergency department with acute on chronic symptoms of left lower extremity weakness, bilateral lower extremity numbness.   Patient does feel like his symptoms are somewhat worse today.  He denies any significant acute changes.  No recent trauma to his back or lower extremities.  No IV drug use.  No fevers or chills.  Patient had 1 episode of urinary incontinence this morning but has since been able to urinate normally.  He has no saddle anesthesia.  Low concern for cauda equina syndrome at this time.  The patient has known degenerative disc disease in his lower lumbar spine.  He has been previously referred to neurosurgery for consideration for spinal stimulator.  He has not yet followed up in clinic.   His neurologic exam today appears to be at baseline with some left lower extremity weakness.  Some subjective decreased sensation in a stocking distribution in the bilateral lower extremities.  Had 1 episode where he felt like he had the urge to urinate and lost control of his bladder but states that since then has been able to urinate without difficulty.  His lower extremity weakness appears to be at baseline.  His decreased sensation is in a stocking distribution and consistent with diabetic neuropathy.  No fevers or chills  and no recent falls.  Very low concern for cauda equina syndrome at this time.  Screening labs collected in the emergency department include B12 which was normal, hemoglobin A1c elevated to 5.9, magnesium normal, BMP unremarkable, CBC without a leukocytosis.   CT of the head was with evidence of an age-indeterminate lacunar infarct in the left pons from her brain MRI in 2019.  CT of  the lumbar spine did not demonstrate evidence of acute fracture or traumatic subluxation.  Multilevel spondylosis had progressed from prior MRI in June, greatest at L2-L3, previous posterior decompression and PLIF at L5-S1 a solid interbody fusion present.    Given the patient's reassuring neurologic exam, I do not think further emergent imaging via MRI imaging of the head or spine is indicated at this time.  Recommended continued follow-up with the patient's neurosurgeon outpatient as he has previously discussed a spinal nerve stimulator.  Additionally recommended follow-up with his neurologist given his history of stroke and subacute findings on CT for further management outpatient.   Final Clinical Impression(s) / ED Diagnoses Final diagnoses:  Polyneuropathy associated with underlying disease (HCC)  Chronic left-sided low back pain with left-sided sciatica    Rx / DC Orders ED Discharge Orders     None        Ernie Avena, MD 04/23/21 1325

## 2021-04-22 NOTE — ED Notes (Signed)
Pt verbalizes understanding of discharge instructions. Opportunity for questioning and answers were provided. Armand removed by staff, pt discharged from ED to home. Educated to f/u with PCP.  

## 2021-08-01 ENCOUNTER — Encounter (HOSPITAL_COMMUNITY): Payer: Self-pay | Admitting: Oncology

## 2021-08-01 ENCOUNTER — Emergency Department (HOSPITAL_COMMUNITY)
Admission: EM | Admit: 2021-08-01 | Discharge: 2021-08-01 | Disposition: A | Discharge status: Home / Self Care | Payer: Medicare Other | Attending: Emergency Medicine | Admitting: Emergency Medicine

## 2021-08-01 ENCOUNTER — Emergency Department (HOSPITAL_COMMUNITY): Payer: Medicare Other

## 2021-08-01 ENCOUNTER — Other Ambulatory Visit: Payer: Self-pay

## 2021-08-01 DIAGNOSIS — M79605 Pain in left leg: Secondary | ICD-10-CM | POA: Diagnosis present

## 2021-08-01 DIAGNOSIS — N183 Chronic kidney disease, stage 3 unspecified: Secondary | ICD-10-CM | POA: Diagnosis not present

## 2021-08-01 DIAGNOSIS — E1140 Type 2 diabetes mellitus with diabetic neuropathy, unspecified: Secondary | ICD-10-CM | POA: Insufficient documentation

## 2021-08-01 DIAGNOSIS — Z7982 Long term (current) use of aspirin: Secondary | ICD-10-CM | POA: Diagnosis not present

## 2021-08-01 DIAGNOSIS — Z87891 Personal history of nicotine dependence: Secondary | ICD-10-CM | POA: Insufficient documentation

## 2021-08-01 DIAGNOSIS — Z79899 Other long term (current) drug therapy: Secondary | ICD-10-CM | POA: Insufficient documentation

## 2021-08-01 DIAGNOSIS — I5032 Chronic diastolic (congestive) heart failure: Secondary | ICD-10-CM | POA: Diagnosis not present

## 2021-08-01 DIAGNOSIS — I130 Hypertensive heart and chronic kidney disease with heart failure and stage 1 through stage 4 chronic kidney disease, or unspecified chronic kidney disease: Secondary | ICD-10-CM | POA: Insufficient documentation

## 2021-08-01 DIAGNOSIS — E1122 Type 2 diabetes mellitus with diabetic chronic kidney disease: Secondary | ICD-10-CM | POA: Diagnosis not present

## 2021-08-01 DIAGNOSIS — M109 Gout, unspecified: Secondary | ICD-10-CM | POA: Insufficient documentation

## 2021-08-01 LAB — BASIC METABOLIC PANEL
Anion gap: 10 (ref 5–15)
BUN: 16 mg/dL (ref 8–23)
CO2: 23 mmol/L (ref 22–32)
Calcium: 9.1 mg/dL (ref 8.9–10.3)
Chloride: 104 mmol/L (ref 98–111)
Creatinine, Ser: 1.15 mg/dL (ref 0.61–1.24)
GFR, Estimated: >60 mL/min (ref >60)
Glucose, Bld: 108 mg/dL — ABNORMAL HIGH (ref 70–99)
Potassium: 3.4 mmol/L — ABNORMAL LOW (ref 3.5–5.1)
Sodium: 137 mmol/L (ref 135–145)

## 2021-08-01 LAB — CBC WITH DIFFERENTIAL/PLATELET
Abs Immature Granulocytes: 0.02 10*3/uL (ref 0.00–0.07)
Basophils Absolute: 0 10*3/uL (ref 0.0–0.1)
Basophils Relative: 0 %
Eosinophils Absolute: 0 10*3/uL (ref 0.0–0.5)
Eosinophils Relative: 1 %
HCT: 37.6 % — ABNORMAL LOW (ref 39.0–52.0)
Hemoglobin: 11.7 g/dL — ABNORMAL LOW (ref 13.0–17.0)
Immature Granulocytes: 0 %
Lymphocytes Relative: 18 %
Lymphs Abs: 1.2 10*3/uL (ref 0.7–4.0)
MCH: 26.9 pg (ref 26.0–34.0)
MCHC: 31.1 g/dL (ref 30.0–36.0)
MCV: 86.4 fL (ref 80.0–100.0)
Monocytes Absolute: 0.7 10*3/uL (ref 0.1–1.0)
Monocytes Relative: 10 %
Neutro Abs: 4.9 10*3/uL (ref 1.7–7.7)
Neutrophils Relative %: 71 %
Platelets: 206 10*3/uL (ref 150–400)
RBC: 4.35 MIL/uL (ref 4.22–5.81)
RDW: 14.8 % (ref 11.5–15.5)
WBC: 6.9 10*3/uL (ref 4.0–10.5)
nRBC: 0 % (ref 0.0–0.2)

## 2021-08-01 LAB — CBG MONITORING, ED: Glucose-Capillary: 97 mg/dL (ref 70–99)

## 2021-08-01 LAB — VITAMIN B12: Vitamin B-12: 292 pg/mL (ref 180–914)

## 2021-08-01 MED ORDER — COLCHICINE 0.6 MG PO TABS
0.6000 mg | ORAL_TABLET | Freq: Once | ORAL | Status: AC
  Administered 2021-08-01: 21:00:00 0.6 mg via ORAL
  Filled 2021-08-01: qty 1

## 2021-08-01 MED ORDER — HYDROCODONE-ACETAMINOPHEN 5-325 MG PO TABS
1.0000 | ORAL_TABLET | Freq: Once | ORAL | Status: AC
  Administered 2021-08-01: 21:00:00 1 via ORAL
  Filled 2021-08-01: qty 1

## 2021-08-01 NOTE — ED Provider Notes (Signed)
Emergency Medicine Provider Triage Evaluation Note  Luis Poole , a 80 y.o. male  was evaluated in triage.  Pt complains of worsening weakness in bilateral lower extremities. Hx of extensive spine dysfunction. Reports significant pain, in BIL lower extremities, inability to walk suddenly this morning. NO new fall. No urinary retention, difficulty with defecation..  Review of Systems  Positive: Leg pain, back pain Negative: fall  Physical Exam  BP (!) 155/77 (BP Location: Left Arm)    Pulse 81    Temp 98 F (36.7 C) (Oral)    Resp 16    SpO2 100%  Gen:   Awake, no distress   Resp:  Normal effort  MSK:   Moves extremities without difficulty  Other:  3/5 or worse strength bil LE, left worse than right  Medical Decision Making  Medically screening exam initiated at 5:29 PM.  Appropriate orders placed.  Antony Odea was informed that the remainder of the evaluation will be completed by another provider, this initial triage assessment does not replace that evaluation, and the importance of remaining in the ED until their evaluation is complete.  Worsening leg function, pain, neuropathy   Olene Floss, PA-C 08/01/21 1732    Cathren Laine, MD 08/01/21 [phone]

## 2021-08-01 NOTE — Discharge Instructions (Signed)
Take your colchicine medication as prescribed.  Follow-up with your primary care doctor to be rechecked.  Return as needed for fevers chills or other worsening symptoms

## 2021-08-01 NOTE — ED Triage Notes (Signed)
Per EMS, history of neuropathy-states flare up for 2 days- has been recently prescribed gout med

## 2021-08-01 NOTE — ED Provider Notes (Signed)
[facility] [facility] DEPT Provider Note   CSN: [id number] Arrival date & time: 08/01/21  1637     History   Luis Poole is a 80 y.o. male.  HPI   Complaint: Pain in his lower extremities Patient presents to the ER for evaluation of lower extremity pain.  Patient states he is having pain primarily in his feet.  It is mostly in his big toe but goes up into his foot.  He has had some pain in his legs as well.  Patient feels like it is gout flaring up again.  Usually takes colchicine but he did not have any of his medication.  Later in the day he was able to get his prescription but the pain was more severe so he came to the ED.  He denies any fevers or chills.  No acute numbness or weakness.  No abdominal pain.  No rashes.  Past Medical History:  Diagnosis Date   Anemia, unspecified 06/08/2013   Cataract    CHF (congestive heart failure) (HCC)    Chronic kidney disease    Diabetes mellitus    Hypertension    S/P lumbar fusion    Stroke [facility])     Patient Active Problem List   Diagnosis Date Noted   Syncope 10/06/2017   Chest pain 10/06/2017   Low back pain 06/28/2017   Leg swelling 08/23/2013   Left leg swelling 08/23/2013   Neuropathy 08/23/2013   Acute gout 08/23/2013   D-dimer, elevated 08/23/2013   CKD (chronic kidney disease), stage III ([place]) 08/23/2013   Chronic diastolic heart failure ([place]) 08/23/2013   Anemia 06/08/2013   Hypertension 08/29/2012   Diabetes mellitus type 2 in nonobese ([place]) 08/29/2012   History of CVA (cerebrovascular accident) 08/28/2012    Class: Acute   L-S radiculopathy 08/28/2012   Lumbar post-laminectomy syndrome 01/15/2012   Thoracic or lumbosacral neuritis or radiculitis, unspecified 01/15/2012   Left hemiparesis ([place]) 01/15/2012   Diabetic peripheral neuropathy ([place]) 01/15/2012    Past Surgical History:  Procedure Laterality Date   CATARACT EXTRACTION     EYE SURGERY     KNEE SURGERY  2006   NECK SURGERY   2012   SPINE SURGERY  2009   TONSILECTOMY, ADENOIDECTOMY, BILATERAL MYRINGOTOMY AND TUBES         Family History  Problem Relation Age of Onset   Stroke Mother     Social History   Tobacco Use   Smoking status: Former   Smokeless tobacco: Former  Scientific laboratory technician Use: Never used  Substance Use Topics   Alcohol use: No   Drug use: No    Home Medications Prior to Admission medications   Medication Sig Start Date End Date Taking? Authorizing Provider  amLODipine (NORVASC) 10 MG tablet Take 10 mg by mouth daily.     [provider]  aspirin EC 81 MG tablet Take 81 mg by mouth daily.    [provider]  atorvastatin (LIPITOR) 20 MG tablet Take 1 tablet (20 mg total) by mouth daily. 10/09/17   Shelly Coss, MD  colchicine 0.6 MG tablet Take 1 tablet (0.6 mg total) daily by mouth. Patient taking differently: Take 0.6 mg by mouth daily as needed (gout pain). 06/29/17   Rosita Fire, MD  ferrous sulfate 325 (65 FE) MG tablet Take 325 mg by mouth daily with breakfast.    [provider]  furosemide (LASIX) 20 MG tablet Take 1 tablet (20 mg total)  by mouth daily. 09/24/17   Elpidio Anis, PA-C  Omega-3 Fatty Acids (FISH OIL) 1000 MG CAPS Take 1,000 mg by mouth daily.    [provider]  pregabalin (LYRICA) 100 MG capsule Take 100 mg by mouth 2 (two) times daily.    [provider]  valsartan (DIOVAN) 160 MG tablet Take 160 mg daily by mouth.    [provider]    Allergies    Ibuprofen, Lisinopril, Pregabalin, and Gabapentin  Review of Systems   Review of Systems  All other systems reviewed and are negative.  Physical Exam Updated Vital Signs BP (!) 155/77 (BP Location: Left Arm)    Pulse 81    Temp 98 F (36.7 C) (Oral)    Resp 16    SpO2 100%   Physical Exam Vitals and nursing note reviewed.  Constitutional:      General: He is not in acute distress.    Appearance: He is well-developed.  HENT:     Head:  Normocephalic and atraumatic.     Right Ear: External ear normal.     Left Ear: External ear normal.  Eyes:     General: No scleral icterus.       Right eye: No discharge.        Left eye: No discharge.     Conjunctiva/sclera: Conjunctivae normal.  Neck:     Trachea: No tracheal deviation.  Cardiovascular:     Rate and Rhythm: Normal rate and regular rhythm.  Pulmonary:     Effort: Pulmonary effort is normal. No respiratory distress.     Breath sounds: Normal breath sounds. No stridor. No wheezing or rales.  Abdominal:     General: Bowel sounds are normal. There is no distension.     Palpations: Abdomen is soft.     Tenderness: There is no abdominal tenderness. There is no guarding or rebound.  Musculoskeletal:        General: No tenderness or deformity.     Cervical back: Neck supple.     Comments: Brace in the left lower extremity, no evidence of rashes or erythema on his feet, normal perfusion, mild tenderness palpation great toe  Skin:    General: Skin is warm and dry.     Findings: No rash.  Neurological:     General: No focal deficit present.     Mental Status: He is alert.     Cranial Nerves: No cranial nerve deficit (no facial droop, extraocular movements intact, no slurred speech).     Sensory: No sensory deficit.     Motor: No abnormal muscle tone or seizure activity.     Coordination: Coordination normal.  Psychiatric:        Mood and Affect: Mood normal.    ED Results / Procedures / Treatments   Labs (all labs ordered are listed, but only abnormal results are displayed) Labs Reviewed  CBC WITH DIFFERENTIAL/PLATELET - Abnormal; Notable for the following components:      Result Value   Hemoglobin 11.7 (*)    HCT 37.6 (*)    All other components within normal limits  BASIC METABOLIC PANEL - Abnormal; Notable for the following components:   Potassium 3.4 (*)    Glucose, Bld 108 (*)    All other components within normal limits  VITAMIN B12  CBG MONITORING, ED     EKG None  Radiology CT Lumbar Spine Wo Contrast  Result Date: 08/01/2021 CLINICAL DATA:  Spinal stenosis, lumbar; technologist note states worsening  lower extremity weakness EXAM: CT LUMBAR SPINE WITHOUT CONTRAST TECHNIQUE: Multidetector CT imaging of the lumbar spine was performed without intravenous contrast administration. Multiplanar CT image reconstructions were also generated. COMPARISON:  04/22/2021 FINDINGS: Segmentation: Numbering is kept consistent with prior study. Alignment: Stable. Vertebrae: Postoperative changes at L5-S1 with rods and pedicle screws and interbody spacer. No evidence of hardware complication. There is solid interbody fusion. Laminectomies at L4-L5 and L5-S1. Degenerative endplate changes are stable. No new loss of vertebral body height. Paraspinal and other soft tissues: No new abnormality. Disc levels: Multilevel degenerative changes are similar appearance. The canal is obscured at the operative levels. No apparent high-grade canal stenosis. Disc bulges with endplate osteophytic ridging and facet hypertrophy results in similar foraminal narrowing. IMPRESSION: No acute abnormality. Stable appearance postoperative and degenerative changes. Electronically Signed   By: Macy Mis M.D.   On: 08/01/2021 18:15    Procedures Procedures   Medications Ordered in ED Medications  HYDROcodone-acetaminophen (NORCO/VICODIN) 5-325 MG per tablet 1 tablet (has no administration in time range)  colchicine tablet 0.6 mg (has no administration in time range)    ED Course  I have reviewed the triage vital signs and the nursing notes.  Pertinent labs & imaging results that were available during my care of the patient were reviewed by me and considered in my medical decision making (see chart for details).  Clinical Course as of 08/01/21 2047  Wed Aug 01, 2021  [id number] CBC and metabolic panel unremarkable. [JK]  2027 Vitamin B12 levels within normal range [JK]  2027 No acute  findings noted on CT scan [JK]    Clinical Course User Index [JK] Dorie Rank, MD   MDM Rules/Calculators/A&P                           Patient is complaining of leg pain.  Consider the possibility of bilateral neuropathy although he does not appear to have any acute weakness.  Patient also has normal sensation.  He seems to have more discrete pain on his toe.  Patient does have history of gout.  We will give him a dose of pain medications.  He does have his colchicine at home.  This appears stable for discharge outpatient management. Final Clinical Impression(s) / ED Diagnoses Final diagnoses:  Acute gout, unspecified cause, unspecified site    Rx / DC Orders ED Discharge Orders     None        Dorie Rank, MD 08/01/21 2047

## 2021-09-18 ENCOUNTER — Encounter: Payer: Self-pay | Admitting: *Deleted

## 2021-09-21 ENCOUNTER — Encounter: Payer: Self-pay | Admitting: Diagnostic Neuroimaging

## 2021-09-21 ENCOUNTER — Ambulatory Visit (INDEPENDENT_AMBULATORY_CARE_PROVIDER_SITE_OTHER): Payer: Medicare Other | Admitting: Diagnostic Neuroimaging

## 2021-09-21 VITALS — BP 164/84 | HR 69 | Ht 67.0 in | Wt 214.0 lb

## 2021-09-21 DIAGNOSIS — M5442 Lumbago with sciatica, left side: Secondary | ICD-10-CM | POA: Diagnosis not present

## 2021-09-21 DIAGNOSIS — G8929 Other chronic pain: Secondary | ICD-10-CM | POA: Diagnosis not present

## 2021-09-21 DIAGNOSIS — M79605 Pain in left leg: Secondary | ICD-10-CM | POA: Diagnosis not present

## 2021-09-21 NOTE — Patient Instructions (Signed)
°  STROKE PREVENTION - continue secondary stroke prevention (aspirin, BP control) - follow up with PCP re: lipid, diabetes control   LEFT LEG PAIN (due to lumbar radiculopathies, lumbar spinal stenosis, s/p surgery in 2012, left hip arthritis, diabetic neuropathy) - tried and failed: gabapentin (sedation), lyrica (ineffective) - may use limited tylenol and ibuprofen for pain control - follow up with [facility]
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
## 2022-02-17 ENCOUNTER — Encounter (HOSPITAL_COMMUNITY): Payer: Self-pay

## 2022-02-17 ENCOUNTER — Emergency Department (HOSPITAL_COMMUNITY): Payer: Medicare Other

## 2022-02-17 ENCOUNTER — Emergency Department (HOSPITAL_COMMUNITY)
Admission: EM | Admit: 2022-02-17 | Discharge: 2022-02-17 | Disposition: A | Discharge status: Home / Self Care | Payer: Medicare Other | Attending: Emergency Medicine | Admitting: Emergency Medicine

## 2022-02-17 DIAGNOSIS — Z79899 Other long term (current) drug therapy: Secondary | ICD-10-CM | POA: Diagnosis not present

## 2022-02-17 DIAGNOSIS — N189 Chronic kidney disease, unspecified: Secondary | ICD-10-CM | POA: Diagnosis not present

## 2022-02-17 DIAGNOSIS — I130 Hypertensive heart and chronic kidney disease with heart failure and stage 1 through stage 4 chronic kidney disease, or unspecified chronic kidney disease: Secondary | ICD-10-CM | POA: Insufficient documentation

## 2022-02-17 DIAGNOSIS — Z7982 Long term (current) use of aspirin: Secondary | ICD-10-CM | POA: Diagnosis not present

## 2022-02-17 DIAGNOSIS — I509 Heart failure, unspecified: Secondary | ICD-10-CM | POA: Diagnosis not present

## 2022-02-17 DIAGNOSIS — E1122 Type 2 diabetes mellitus with diabetic chronic kidney disease: Secondary | ICD-10-CM | POA: Diagnosis not present

## 2022-02-17 DIAGNOSIS — Z8673 Personal history of transient ischemic attack (TIA), and cerebral infarction without residual deficits: Secondary | ICD-10-CM | POA: Insufficient documentation

## 2022-02-17 DIAGNOSIS — R1011 Right upper quadrant pain: Secondary | ICD-10-CM | POA: Insufficient documentation

## 2022-02-17 DIAGNOSIS — M549 Dorsalgia, unspecified: Secondary | ICD-10-CM | POA: Diagnosis not present

## 2022-02-17 DIAGNOSIS — G8918 Other acute postprocedural pain: Secondary | ICD-10-CM | POA: Diagnosis not present

## 2022-02-17 LAB — COMPREHENSIVE METABOLIC PANEL
ALT: 10 U/L (ref 0–44)
AST: 16 U/L (ref 15–41)
Albumin: 3.4 g/dL — ABNORMAL LOW (ref 3.5–5.0)
Alkaline Phosphatase: 64 U/L (ref 38–126)
Anion gap: 8 (ref 5–15)
BUN: 14 mg/dL (ref 8–23)
CO2: 25 mmol/L (ref 22–32)
Calcium: 9.1 mg/dL (ref 8.9–10.3)
Chloride: 103 mmol/L (ref 98–111)
Creatinine, Ser: 1.2 mg/dL (ref 0.61–1.24)
GFR, Estimated: >60 mL/min (ref >60)
Glucose, Bld: 120 mg/dL — ABNORMAL HIGH (ref 70–99)
Potassium: 4.3 mmol/L (ref 3.5–5.1)
Sodium: 136 mmol/L (ref 135–145)
Total Bilirubin: 0.8 mg/dL (ref 0.3–1.2)
Total Protein: 7.3 g/dL (ref 6.5–8.1)

## 2022-02-17 LAB — CBC WITH DIFFERENTIAL/PLATELET
Abs Immature Granulocytes: 0.02 10*3/uL (ref 0.00–0.07)
Basophils Absolute: 0 10*3/uL (ref 0.0–0.1)
Basophils Relative: 0 %
Eosinophils Absolute: 0.1 10*3/uL (ref 0.0–0.5)
Eosinophils Relative: 1 %
HCT: 35 % — ABNORMAL LOW (ref 39.0–52.0)
Hemoglobin: 11.3 g/dL — ABNORMAL LOW (ref 13.0–17.0)
Immature Granulocytes: 0 %
Lymphocytes Relative: 26 %
Lymphs Abs: 2 10*3/uL (ref 0.7–4.0)
MCH: 27.6 pg (ref 26.0–34.0)
MCHC: 32.3 g/dL (ref 30.0–36.0)
MCV: 85.6 fL (ref 80.0–100.0)
Monocytes Absolute: 0.8 10*3/uL (ref 0.1–1.0)
Monocytes Relative: 10 %
Neutro Abs: 5.1 10*3/uL (ref 1.7–7.7)
Neutrophils Relative %: 63 %
Platelets: 194 10*3/uL (ref 150–400)
RBC: 4.09 MIL/uL — ABNORMAL LOW (ref 4.22–5.81)
RDW: 14.8 % (ref 11.5–15.5)
WBC: 8 10*3/uL (ref 4.0–10.5)
nRBC: 0 % (ref 0.0–0.2)

## 2022-02-17 LAB — LIPASE, BLOOD: Lipase: 20 U/L (ref 11–51)

## 2022-02-17 MED ORDER — IOHEXOL 300 MG/ML  SOLN
100.0000 mL | Freq: Once | INTRAMUSCULAR | Status: AC | PRN
  Administered 2022-02-17: 100 mL via INTRAVENOUS

## 2022-02-17 MED ORDER — FENTANYL CITRATE PF 50 MCG/ML IJ SOSY
100.0000 ug | PREFILLED_SYRINGE | Freq: Once | INTRAMUSCULAR | Status: AC
  Administered 2022-02-17: 100 ug via INTRAVENOUS
  Filled 2022-02-17: qty 2
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
## 2022-02-17 NOTE — Discharge Instructions (Addendum)
For any new issues with your surgical site or back pain, please call your surgeon tomorrow

## 2022-02-17 NOTE — ED Notes (Signed)
Dr. Bebe Shaggy notified of BP at d/c. Pt with hx of hypertension and unable to take meds for a day due to being here in hospital. No new orders received to HTN medications at this time. Per Dr. Bebe Shaggy, given ok to d.c.

## 2022-02-17 NOTE — ED Triage Notes (Signed)
Pt comes via [facility] for back pain, had back surgery done a week ago, stimulator placed in [place] and pain is unrelieved by home meds.

## 2022-11-25 ENCOUNTER — Encounter (HOSPITAL_COMMUNITY): Payer: Self-pay

## 2022-11-25 ENCOUNTER — Observation Stay (HOSPITAL_COMMUNITY)
Admission: EM | Admit: 2022-11-25 | Discharge: 2022-11-29 | Disposition: A | Discharge status: Home Health | Payer: 59 | Attending: Internal Medicine | Admitting: Internal Medicine

## 2022-11-25 ENCOUNTER — Other Ambulatory Visit: Payer: Self-pay

## 2022-11-25 DIAGNOSIS — Z96652 Presence of left artificial knee joint: Secondary | ICD-10-CM | POA: Diagnosis not present

## 2022-11-25 DIAGNOSIS — Z8673 Personal history of transient ischemic attack (TIA), and cerebral infarction without residual deficits: Secondary | ICD-10-CM | POA: Diagnosis not present

## 2022-11-25 DIAGNOSIS — R7989 Other specified abnormal findings of blood chemistry: Secondary | ICD-10-CM | POA: Insufficient documentation

## 2022-11-25 DIAGNOSIS — E1151 Type 2 diabetes mellitus with diabetic peripheral angiopathy without gangrene: Secondary | ICD-10-CM | POA: Insufficient documentation

## 2022-11-25 DIAGNOSIS — I509 Heart failure, unspecified: Secondary | ICD-10-CM | POA: Insufficient documentation

## 2022-11-25 DIAGNOSIS — R55 Syncope and collapse: Principal | ICD-10-CM | POA: Insufficient documentation

## 2022-11-25 DIAGNOSIS — Z7982 Long term (current) use of aspirin: Secondary | ICD-10-CM | POA: Diagnosis not present

## 2022-11-25 DIAGNOSIS — N189 Chronic kidney disease, unspecified: Secondary | ICD-10-CM | POA: Diagnosis not present

## 2022-11-25 DIAGNOSIS — E1122 Type 2 diabetes mellitus with diabetic chronic kidney disease: Secondary | ICD-10-CM | POA: Insufficient documentation

## 2022-11-25 DIAGNOSIS — Z79899 Other long term (current) drug therapy: Secondary | ICD-10-CM | POA: Insufficient documentation

## 2022-11-25 DIAGNOSIS — Z87891 Personal history of nicotine dependence: Secondary | ICD-10-CM | POA: Diagnosis not present

## 2022-11-25 DIAGNOSIS — I130 Hypertensive heart and chronic kidney disease with heart failure and stage 1 through stage 4 chronic kidney disease, or unspecified chronic kidney disease: Secondary | ICD-10-CM | POA: Diagnosis not present

## 2022-11-25 DIAGNOSIS — E876 Hypokalemia: Secondary | ICD-10-CM | POA: Insufficient documentation

## 2022-11-25 LAB — COMPREHENSIVE METABOLIC PANEL
ALT: 13 U/L (ref 0–44)
AST: 21 U/L (ref 15–41)
Albumin: 3.6 g/dL (ref 3.5–5.0)
Alkaline Phosphatase: 61 U/L (ref 38–126)
Anion gap: 9 (ref 5–15)
BUN: 14 mg/dL (ref 8–23)
CO2: 21 mmol/L — ABNORMAL LOW (ref 22–32)
Calcium: 8.5 mg/dL — ABNORMAL LOW (ref 8.9–10.3)
Chloride: 108 mmol/L (ref 98–111)
Creatinine, Ser: 1.15 mg/dL (ref 0.61–1.24)
GFR, Estimated: >60 mL/min (ref >60)
Glucose, Bld: 118 mg/dL — ABNORMAL HIGH (ref 70–99)
Potassium: 3.2 mmol/L — ABNORMAL LOW (ref 3.5–5.1)
Sodium: 138 mmol/L (ref 135–145)
Total Bilirubin: 0.7 mg/dL (ref 0.3–1.2)
Total Protein: 7.6 g/dL (ref 6.5–8.1)

## 2022-11-25 LAB — CBC WITH DIFFERENTIAL/PLATELET
Abs Immature Granulocytes: 0.01 10*3/uL (ref 0.00–0.07)
Basophils Absolute: 0 10*3/uL (ref 0.0–0.1)
Basophils Relative: 0 %
Eosinophils Absolute: 0 10*3/uL (ref 0.0–0.5)
Eosinophils Relative: 1 %
HCT: 36.2 % — ABNORMAL LOW (ref 39.0–52.0)
Hemoglobin: 11.7 g/dL — ABNORMAL LOW (ref 13.0–17.0)
Immature Granulocytes: 0 %
Lymphocytes Relative: 25 %
Lymphs Abs: 1.2 10*3/uL (ref 0.7–4.0)
MCH: 28 pg (ref 26.0–34.0)
MCHC: 32.3 g/dL (ref 30.0–36.0)
MCV: 86.6 fL (ref 80.0–100.0)
Monocytes Absolute: 0.3 10*3/uL (ref 0.1–1.0)
Monocytes Relative: 6 %
Neutro Abs: 3.5 10*3/uL (ref 1.7–7.7)
Neutrophils Relative %: 68 %
Platelets: 177 10*3/uL (ref 150–400)
RBC: 4.18 MIL/uL — ABNORMAL LOW (ref 4.22–5.81)
RDW: 14.1 % (ref 11.5–15.5)
WBC: 5.1 10*3/uL (ref 4.0–10.5)
nRBC: 0 % (ref 0.0–0.2)

## 2022-11-25 LAB — TROPONIN I (HIGH SENSITIVITY): Troponin I (High Sensitivity): 12 ng/L (ref <18)

## 2022-11-25 LAB — CBG MONITORING, ED: Glucose-Capillary: 107 mg/dL — ABNORMAL HIGH (ref 70–99)

## 2022-11-25 NOTE — ED Triage Notes (Signed)
[facility] from [facility], for near syncope after 2 mile walk, denies chest pain, SOB , 1 episode of vomiting.  106/68 initial BP 0.9 fluids zofran 150/84 [id number]

## 2022-11-25 NOTE — ED Provider Notes (Signed)
[place] EMERGENCY DEPARTMENT AT [facility] Provider Note   CSN: [id number] Arrival date & time: 11/25/22  2039     History {Add pertinent medical, surgical, social history, OB history to HPI:1} Chief Complaint  Patient presents with   Near Syncope    Luis Poole is a 82 y.o. male.  The history is provided by the patient and medical records.  Near Syncope   82 year old male with history of anemia, gout, chronic kidney disease, diabetes, history of prior stroke, peripheral neuropathy, PAD, presenting to the ED for near syncopal event.  Patient reports he walked 2 miles today from his house to local [facility] to try and get some money out of the ATM.  States once he finally got there he started to feel lightheaded, dark spots in vision, etc.  He never lost consciousness.  He sat down on his rolling walker and started to feel better.  He went inside [facility] and asked them to call 911.  He denies hx of syncope in the past.  He never had any chest pain or SOB with this.  No numbness/weakness.    Home Medications Prior to Admission medications   Medication Sig Start Date End Date Taking? Authorizing Provider  amLODipine (NORVASC) 10 MG tablet Take 10 mg by mouth daily.     [provider]  aspirin EC 81 MG tablet Take 81 mg by mouth daily.    [provider]  atorvastatin (LIPITOR) 20 MG tablet Take 1 tablet (20 mg total) by mouth daily. 10/09/17   Burnadette Pop, MD  colchicine 0.6 MG tablet Take 1 tablet (0.6 mg total) daily by mouth. Patient taking differently: Take 0.6 mg by mouth daily as needed (gout pain). 06/29/17   Maxie Barb, MD  ferrous sulfate 325 (65 FE) MG tablet Take 325 mg by mouth daily with breakfast.    [provider]  furosemide (LASIX) 20 MG tablet Take 1 tablet (20 mg total) by mouth daily. 09/24/17   Elpidio Anis, PA-C  Omega-3 Fatty Acids (FISH OIL) 1000 MG CAPS Take 1,000 mg by mouth daily.    [provider]  pregabalin (LYRICA) 100 MG capsule Take 100 mg by mouth 2 (two) times daily.    [provider]  valsartan (DIOVAN) 160 MG tablet Take 160 mg daily by mouth.    [provider]      Allergies    Ibuprofen, Lisinopril, Pregabalin, and Gabapentin    Review of Systems   Review of Systems  Cardiovascular:  Positive for near-syncope.  All other systems reviewed and are negative.   Physical Exam Updated Vital Signs BP 130/70 (BP Location: Right Arm)   Pulse 73   Temp 97.8 F (36.6 C) (Oral)   Resp 12   SpO2 99%   Physical Exam Vitals and nursing note reviewed.  Constitutional:      Appearance: He is well-developed.  HENT:     Head: Normocephalic and atraumatic.  Eyes:     Conjunctiva/sclera: Conjunctivae normal.     Pupils: Pupils are equal, round, and reactive to light.  Cardiovascular:     Rate and Rhythm: Normal rate and regular rhythm.     Heart sounds: Normal heart sounds.  Pulmonary:     Effort: Pulmonary effort is normal.     Breath sounds: Normal breath sounds.  Abdominal:     General: Bowel sounds are normal.     Palpations: Abdomen is soft.  Musculoskeletal:  General: Normal range of motion.     Cervical back: Normal range of motion.  Skin:    General: Skin is warm and dry.  Neurological:     Mental Status: He is alert and oriented to person, place, and time.     ED Results / Procedures / Treatments   Labs (all labs ordered are listed, but only abnormal results are displayed) Labs Reviewed  CBC WITH DIFFERENTIAL/PLATELET - Abnormal; Notable for the following components:      Result Value   RBC 4.18 (*)    Hemoglobin 11.7 (*)    HCT 36.2 (*)    All other components within normal limits  COMPREHENSIVE METABOLIC PANEL - Abnormal; Notable for the following components:   Potassium 3.2 (*)    CO2 21 (*)    Glucose, Bld 118 (*)    Calcium 8.5 (*)    All other components within normal limits  CBG MONITORING, ED -  Abnormal; Notable for the following components:   Glucose-Capillary 107 (*)    All other components within normal limits  TROPONIN I (HIGH SENSITIVITY)    EKG None  Radiology No results found.  Procedures Procedures  {Document cardiac monitor, telemetry assessment procedure when appropriate:1}  Medications Ordered in ED Medications - No data to display  ED Course/ Medical Decision Making/ A&P   {   Click here for ABCD2, HEART and other calculatorsREFRESH Note before signing :1}                          Medical Decision Making  ***  {Document critical care time when appropriate:1} {Document review of labs and clinical decision tools ie heart score, Chads2Vasc2 etc:1}  {Document your independent review of radiology images, and any outside records:1} {Document your discussion with family members, caretakers, and with consultants:1} {Document social determinants of health affecting pt's care:1} {Document your decision making why or why not admission, treatments were needed:1} Final Clinical Impression(s) / ED Diagnoses Final diagnoses:  None    Rx / DC Orders ED Discharge Orders     None

## 2022-11-26 ENCOUNTER — Emergency Department (HOSPITAL_COMMUNITY): Payer: 59

## 2022-11-26 ENCOUNTER — Observation Stay (HOSPITAL_BASED_OUTPATIENT_CLINIC_OR_DEPARTMENT_OTHER): Payer: 59

## 2022-11-26 DIAGNOSIS — R55 Syncope and collapse: Secondary | ICD-10-CM | POA: Diagnosis not present

## 2022-11-26 LAB — CBC
HCT: 35 % — ABNORMAL LOW (ref 39.0–52.0)
Hemoglobin: 11.4 g/dL — ABNORMAL LOW (ref 13.0–17.0)
MCH: 28.3 pg (ref 26.0–34.0)
MCHC: 32.6 g/dL (ref 30.0–36.0)
MCV: 86.8 fL (ref 80.0–100.0)
Platelets: 185 10*3/uL (ref 150–400)
RBC: 4.03 MIL/uL — ABNORMAL LOW (ref 4.22–5.81)
RDW: 14.2 % (ref 11.5–15.5)
WBC: 5.8 10*3/uL (ref 4.0–10.5)
nRBC: 0 % (ref 0.0–0.2)

## 2022-11-26 LAB — ECHOCARDIOGRAM COMPLETE
Area-P 1/2: 2.34 cm2
Calc EF: 75.8 %
Est EF: >75
Height: 67 in
S' Lateral: 1.8 cm
Single Plane A2C EF: 77.2 %
Single Plane A4C EF: 76.8 %
Weight: 3110.4 oz

## 2022-11-26 LAB — BASIC METABOLIC PANEL
Anion gap: 8 (ref 5–15)
BUN: 13 mg/dL (ref 8–23)
CO2: 24 mmol/L (ref 22–32)
Calcium: 8.6 mg/dL — ABNORMAL LOW (ref 8.9–10.3)
Chloride: 107 mmol/L (ref 98–111)
Creatinine, Ser: 1.1 mg/dL (ref 0.61–1.24)
GFR, Estimated: >60 mL/min (ref >60)
Glucose, Bld: 105 mg/dL — ABNORMAL HIGH (ref 70–99)
Potassium: 3.2 mmol/L — ABNORMAL LOW (ref 3.5–5.1)
Sodium: 139 mmol/L (ref 135–145)

## 2022-11-26 LAB — MAGNESIUM: Magnesium: 1.6 mg/dL — ABNORMAL LOW (ref 1.7–2.4)

## 2022-11-26 LAB — CREATININE, SERUM
Creatinine, Ser: 0.98 mg/dL (ref 0.61–1.24)
GFR, Estimated: >60 mL/min (ref >60)

## 2022-11-26 LAB — PHOSPHORUS: Phosphorus: 3.7 mg/dL (ref 2.5–4.6)

## 2022-11-26 LAB — TROPONIN I (HIGH SENSITIVITY)
Troponin I (High Sensitivity): 26 ng/L — ABNORMAL HIGH (ref <18)
Troponin I (High Sensitivity): 28 ng/L — ABNORMAL HIGH (ref <18)
Troponin I (High Sensitivity): 39 ng/L — ABNORMAL HIGH (ref <18)

## 2022-11-26 MED ORDER — ENOXAPARIN SODIUM 40 MG/0.4ML IJ SOSY
40.0000 mg | PREFILLED_SYRINGE | INTRAMUSCULAR | Status: DC
  Administered 2022-11-26 – 2022-11-29 (×4): 40 mg via SUBCUTANEOUS
  Filled 2022-11-26 (×4): qty 0.4

## 2022-11-26 MED ORDER — PREGABALIN 50 MG PO CAPS
100.0000 mg | ORAL_CAPSULE | Freq: Two times a day (BID) | ORAL | Status: DC
  Administered 2022-11-26 – 2022-11-29 (×8): 100 mg via ORAL
  Filled 2022-11-26 (×8): qty 2

## 2022-11-26 MED ORDER — FERROUS SULFATE 325 (65 FE) MG PO TABS
325.0000 mg | ORAL_TABLET | Freq: Every day | ORAL | Status: DC
  Administered 2022-11-26 – 2022-11-29 (×4): 325 mg via ORAL
  Filled 2022-11-26 (×4): qty 1

## 2022-11-26 MED ORDER — FISH OIL 1000 MG PO CAPS: 1000.0000 mg | ORAL_CAPSULE | Freq: Every day | ORAL | Status: DC

## 2022-11-26 MED ORDER — IRBESARTAN 75 MG PO TABS: 37.5000 mg | ORAL_TABLET | Freq: Every day | ORAL | Status: DC

## 2022-11-26 MED ORDER — MAGNESIUM SULFATE 2 GM/50ML IV SOLN
2.0000 g | Freq: Once | INTRAVENOUS | Status: AC
  Administered 2022-11-26: 2 g via INTRAVENOUS
  Filled 2022-11-26: qty 50

## 2022-11-26 MED ORDER — POTASSIUM CHLORIDE CRYS ER 20 MEQ PO TBCR
40.0000 meq | EXTENDED_RELEASE_TABLET | Freq: Once | ORAL | Status: AC
  Administered 2022-11-26: 40 meq via ORAL
  Filled 2022-11-26: qty 2

## 2022-11-26 MED ORDER — AMLODIPINE BESYLATE 5 MG PO TABS
10.0000 mg | ORAL_TABLET | Freq: Every day | ORAL | Status: DC
  Administered 2022-11-26 – 2022-11-29 (×4): 10 mg via ORAL
  Filled 2022-11-26 (×4): qty 2

## 2022-11-26 MED ORDER — ONDANSETRON HCL 4 MG/2ML IJ SOLN: 4.0000 mg | Freq: Four times a day (QID) | INTRAMUSCULAR | Status: DC | PRN

## 2022-11-26 MED ORDER — POTASSIUM CHLORIDE CRYS ER 20 MEQ PO TBCR
40.0000 meq | EXTENDED_RELEASE_TABLET | ORAL | Status: AC
  Administered 2022-11-26 (×2): 40 meq via ORAL
  Filled 2022-11-26 (×2): qty 2

## 2022-11-26 MED ORDER — AMLODIPINE BESYLATE 5 MG PO TABS: 10.0000 mg | ORAL_TABLET | Freq: Every day | ORAL | Status: DC

## 2022-11-26 MED ORDER — POLYETHYLENE GLYCOL 3350 17 G PO PACK: 17.0000 g | PACK | Freq: Every day | ORAL | Status: DC | PRN

## 2022-11-26 MED ORDER — ACETAMINOPHEN 500 MG PO TABS
1000.0000 mg | ORAL_TABLET | Freq: Once | ORAL | Status: AC
Start: 2022-11-26 — End: 2022-11-26
  Administered 2022-11-26: 1000 mg via ORAL
  Filled 2022-11-26: qty 2

## 2022-11-26 MED ORDER — ACETAMINOPHEN 325 MG PO TABS: 650.0000 mg | ORAL_TABLET | Freq: Four times a day (QID) | ORAL | Status: DC | PRN

## 2022-11-26 MED ORDER — ASPIRIN 81 MG PO TBEC
81.0000 mg | DELAYED_RELEASE_TABLET | Freq: Every day | ORAL | Status: DC
  Administered 2022-11-26 – 2022-11-29 (×4): 81 mg via ORAL
  Filled 2022-11-26 (×4): qty 1

## 2022-11-26 MED ORDER — ATORVASTATIN CALCIUM 10 MG PO TABS
20.0000 mg | ORAL_TABLET | Freq: Every day | ORAL | Status: DC
  Administered 2022-11-26 – 2022-11-29 (×4): 20 mg via ORAL
  Filled 2022-11-26 (×4): qty 2

## 2022-11-26 MED ORDER — MELATONIN 5 MG PO TABS
5.0000 mg | ORAL_TABLET | Freq: Every evening | ORAL | Status: DC | PRN
  Administered 2022-11-27: 5 mg via ORAL
  Filled 2022-11-26: qty 1

## 2022-11-26 MED ORDER — MECLIZINE HCL 25 MG PO TABS: 25.0000 mg | ORAL_TABLET | Freq: Three times a day (TID) | ORAL | Status: DC | PRN

## 2022-11-26 NOTE — Evaluation (Signed)
Physical Therapy Evaluation Patient Details Name: Luis Poole MRN: [id number] DOB: 06-24-1941 Today's Date: 11/26/2022  History of Present Illness  Luis Poole is a 82 y.o. male with medical history significant for prior CVA, peripheral neuropathy, s/p spinal stimulator placement, peripheral artery disease, left knee surgeries, hypertension, hyperlipidemia, iron deficiency anemia, who presented to [facility] ED 11/25/22  via EMS after an episode of lightheadedness after walking 2 miles.  Clinical Impression  Pt admitted with above diagnosis.  Pt currently with functional limitations due to the deficits listed below (see PT Problem List). Pt will benefit from acute skilled PT to increase their independence and safety with mobility to allow discharge.     The patient is eager to get OOB.  Patient reporting back and Left knee pain. States" I over did it ."  At baseline, patient reports being Independent in ADL's, cooks, does not drive, family assists with transportation and groceries, patient also shops for groceries, pushing cart. Patient  reports has disable d son who lives in  the house.  Patient is limited in ambulation today, reporting pain. Hopefully patient will progress to return home. Patient will benefit from PT  while in acute care at after DC at appropriate setting.      Recommendations for follow up therapy are one component of a multi-disciplinary discharge planning process, led by the attending physician.  Recommendations may be updated based on patient status, additional functional criteria and insurance authorization.  Follow Up Recommendations       Assistance Recommended at Discharge Intermittent Supervision/Assistance  Patient can return home with the following  A little help with walking and/or transfers;A little help with bathing/dressing/bathroom;A lot of help with bathing/dressing/bathroom;Assist for transportation;Help with stairs or ramp for entrance    Equipment  Recommendations Rollator (4 wheels) (needs new rollator or stand up RW per pt.)  Recommendations for Other Services  OT consult    Functional Status Assessment Patient has had a recent decline in their functional status and demonstrates the ability to make significant improvements in function in a reasonable and predictable amount of time.     Precautions / Restrictions Precautions Precautions: Fall Precaution Comments: uses briefs for incontinence, left brake on rollator does not lock Required Braces or Orthoses: Other Brace Other Brace: L AFO Restrictions Weight Bearing Restrictions: No      Mobility  Bed Mobility Overal bed mobility: Needs Assistance Bed Mobility: Supine to Sit     Supine to sit: Supervision     General bed mobility comments: extra time to scoot to bed edge, trunk flexed    Transfers Overall transfer level: Needs assistance Equipment used: Rollator (4 wheels) Transfers: Sit to/from Stand Sit to Stand: Min assist, From elevated surface           General transfer comment: steady assistance to rise from bed,    Ambulation/Gait Ambulation/Gait assistance: Mod assist Gait Distance (Feet): 5 Feet Assistive device: Rollator (4 wheels) Gait Pattern/deviations: Step-to pattern, Trunk flexed, Decreased step length - left Gait velocity: decr     General Gait Details: noted much difficulty advancing the  LLE,  trunk flexed , also Left brake of rollator not working  Information systems manager Rankin (Stroke Patients Only)       Balance Overall balance assessment: Needs assistance Sitting-balance support: Feet supported, No upper extremity supported Sitting balance-Leahy Scale: Good Sitting balance - Comments: can reach shoes and adjust, reports  able to tie shoelaces   Standing balance support: Bilateral upper extremity supported, Reliant on assistive device for balance, During functional activity Standing  balance-Leahy Scale: Fair Standing balance comment: stnads and pulls up briefs, leans forward                             Pertinent Vitals/Pain Pain Assessment Pain Assessment: Faces Faces Pain Scale: Hurts whole lot Pain Location: back and left knee Pain Descriptors / Indicators: Aching, Discomfort Pain Intervention(s): Monitored during session, Limited activity within patient's tolerance    Home Living Family/patient expects to be discharged to:: Private residence Living Arrangements: Children Available Help at Discharge: Family;Available PRN/intermittently Type of Home: House Home Access: Ramped entrance       Home Layout: One level Home Equipment: Rollator (4 wheels);Shower seat - built in;Grab bars - tub/shower;Wheelchair - manual      Prior Function Prior Level of Function : Independent/Modified Independent             Mobility Comments: uses rolattor ADLs Comments: reports cooking, IADL's, uses pullups     Hand Dominance   Dominant Hand: Right    Extremity/Trunk Assessment   Upper Extremity Assessment Upper Extremity Assessment: Defer to OT evaluation    Lower Extremity Assessment Lower Extremity Assessment: LLE deficits/detail LLE Deficits / Details: decreased knee AROM flexion  and klacks full extension, decreased active dorsiflexion    Cervical / Trunk Assessment Cervical / Trunk Assessment: Kyphotic;Other exceptions Cervical / Trunk Exceptions: stimulator on left back, quite kyphotic but can  extend to more upright with effort  Communication   Communication: No difficulties  Cognition Arousal/Alertness: Awake/alert Behavior During Therapy: WFL for tasks assessed/performed Overall Cognitive Status: Within Functional Limits for tasks assessed                                          General Comments      Exercises     Assessment/Plan    PT Assessment Patient needs continued PT services  PT Problem List  Decreased strength;Decreased balance;Decreased mobility;Decreased range of motion;Decreased knowledge of use of DME;Decreased activity tolerance;Impaired sensation       PT Treatment Interventions DME instruction;Therapeutic activities;Gait training;Therapeutic exercise;Patient/family education;Functional mobility training;Balance training    PT Goals (Current goals can be found in the Care Plan section)  Acute Rehab PT Goals Patient Stated Goal: go home, get a scooter or upright rollator PT Goal Formulation: With patient Time For Goal Achievement: 12/10/22 Potential to Achieve Goals: Good    Frequency Min 3X/week     Co-evaluation               AM-PAC PT "6 Clicks" Mobility  Outcome Measure Help needed turning from your back to your side while in a flat bed without using bedrails?: None Help needed moving from lying on your back to sitting on the side of a flat bed without using bedrails?: A Little Help needed moving to and from a bed to a chair (including a wheelchair)?: A Little Help needed standing up from a chair using your arms (e.g., wheelchair or bedside chair)?: A Little Help needed to walk in hospital room?: Total Help needed climbing 3-5 steps with a railing? : Total 6 Click Score: 15    End of Session Equipment Utilized During Treatment: Gait belt Activity Tolerance: Patient tolerated treatment well Patient  left: in chair;with call bell/phone within reach;with chair alarm set Nurse Communication: Mobility status PT Visit Diagnosis: Unsteadiness on feet (R26.81);Difficulty in walking, not elsewhere classified (R26.2);Pain    Time: [id number] PT Time Calculation (min) (ACUTE ONLY): 27 min   Charges:   PT Evaluation $PT Eval Low Complexity: 1 Low PT Treatments $Gait Training: 8-22 mins        Blanchard KelchKaren Nekoda Chock PT Acute Rehabilitation Services Office [phone] Weekend [phone]   Rada HayHill, Lavona Norsworthy Elizabeth 11/26/2022, 9:21 AM

## 2022-11-26 NOTE — Progress Notes (Signed)
Patient admitted early this morning for lightheadedness and near syncope.  Patient seen and examined at bedside and plan of care discussed with him.  Complains of intermittent dizziness.  I have reviewed patient's medical records including this morning's H&P, current vitals, medications and labs myself.  Most recent troponin is 39.  No chest pain.  Replace electrolytes.  Consult cardiology.  Await PT eval.

## 2022-11-26 NOTE — TOC Transition Note (Addendum)
Transition of Care [facility]) - CM/SW Discharge Note   Patient Details  Name: Luis Poole MRN: [id number] Date of Birth: [birth year]  Transition of Care [facility]) CM/SW Contact:  Lanier Clam, RN Phone Number: 11/26/2022, 9:49 AM   Clinical Narrative:  spoke to patient about d/c plans-he agrees to HHPT-no preference-[facility] rep Cindie HHPT;has rw,rollator. Recc for stand up  walker(not covered by insurance) patient voiced understanding. Has transport home.No further CM needs.    Final next level of care: Home w Home Health Services Barriers to Discharge: No Barriers Identified   Patient Goals and CMS Choice CMS Medicare.gov Compare Post Acute Care list provided to:: Patient Choice offered to / list presented to : Patient  Discharge Placement                         Discharge Plan and Services Additional resources added to the After Visit Summary for     Discharge Planning Services: CM Consult Post Acute Care Choice: Home Health                    HH Arranged: PT [facility] Agency: [facility] Date [facility] Agency Contacted: 11/26/22 Time HH Agency Contacted: [phone] Representative spoke with at [facility] Agency: Cindie  Social Determinants of Health (SDOH) Interventions SDOH Screenings   Food Insecurity: No Food Insecurity (11/26/2022)  Housing: Low Risk  (11/26/2022)  Transportation Needs: No Transportation Needs (11/26/2022)  Utilities: Not At Risk (11/26/2022)  Tobacco Use: Medium Risk (11/25/2022)     Readmission Risk Interventions     No data to display

## 2022-11-26 NOTE — ED Notes (Signed)
ED TO INPATIENT HANDOFF REPORT  ED Nurse Name and Phone #: Linus Orn Name/Age/Gender Luis Poole 82 y.o. male Room/Bed: WA10/WA10  Code Status   Code Status: Full Code  Home/SNF/Other Home Patient oriented to: self, place, time, and situation Is this baseline? Yes   Triage Complete: Triage complete  Chief Complaint Near syncope [R55]  Triage Note [facility] from [facility], for near syncope after 2 mile walk, denies chest pain, SOB , 1 episode of vomiting.  106/68 initial BP 0.9 fluids zofran 150/84 Cbg148 70-76HR   Allergies Allergies  Allergen Reactions   Ibuprofen Itching   Lisinopril Anaphylaxis   Pregabalin Swelling   Gabapentin Other (See Comments)    Dizziness    Level of Care/Admitting Diagnosis ED Disposition     ED Disposition  Admit   Condition  --   Comment  Hospital Area: [facility] [[id number]]  Level of Care: Telemetry [5]  Admit to tele based on following criteria: Monitor for Ischemic changes  May place patient in observation at [facility] or [facility] if equivalent level of care is available:: Yes  Covid Evaluation: Asymptomatic - no recent exposure (last 10 days) testing not required  Diagnosis: Near syncope [692301]  Admitting Physician: Darlin Drop [[id number]]  Attending Physician: Darlin Drop [[id number]]          B Medical/Surgery History Past Medical History:  Diagnosis Date   Anemia, unspecified 06/08/2013   Cataract    CHF (congestive heart failure)    Chronic kidney disease    Diabetes mellitus    Foot drop, left foot    AFO   Hypertension    PAD (peripheral artery disease)    S/P lumbar fusion    Sequelae of cerebral infarction    Stroke    Past Surgical History:  Procedure Laterality Date   CATARACT EXTRACTION     EYE SURGERY     KNEE SURGERY  2006   NECK SURGERY  2012   SPINE SURGERY  2009   TONSILECTOMY, ADENOIDECTOMY, BILATERAL MYRINGOTOMY AND TUBES       A IV  Location/Drains/Wounds Patient Lines/Drains/Airways Status     Active Line/Drains/Airways     Name Placement date Placement time Site Days   External Urinary Catheter 02/08/21  0000  --  656            Intake/Output Last 24 hours  Intake/Output Summary (Last 24 hours) at 11/26/2022 0206 Last data filed at 11/25/2022 2051 Gross per 24 hour  Intake 350 ml  Output --  Net 350 ml    Labs/Imaging Results for orders placed or performed during the hospital encounter of 11/25/22 (from the past 48 hour(s))  Troponin I (High Sensitivity)     Status: Abnormal   Collection Time: 11/25/22 12:06 AM  Result Value Ref Range   Troponin I (High Sensitivity) 26 (H) <18 ng/L    Comment: (NOTE) Elevated high sensitivity troponin I (hsTnI) values and significant  changes across serial measurements may suggest ACS but many other  chronic and acute conditions are known to elevate hsTnI results.  Refer to the "Links" section for chest pain algorithms and additional  guidance. Performed at [facility], [address], [place], [place] [address]   CBC WITH DIFFERENTIAL     Status: Abnormal   Collection Time: 11/25/22  9:25 PM  Result Value Ref Range   WBC 5.1 4.0 - 10.5 K/uL   RBC 4.18 (L) 4.22 -  5.81 MIL/uL   Hemoglobin 11.7 (L) 13.0 - 17.0 g/dL   HCT 19.4 (L) 17.4 - 08.1 %   MCV 86.6 80.0 - 100.0 fL   MCH 28.0 26.0 - 34.0 pg   MCHC 32.3 30.0 - 36.0 g/dL   RDW 44.8 18.5 - 63.1 %   Platelets 177 150 - 400 K/uL   nRBC 0.0 0.0 - 0.2 %   Neutrophils Relative % 68 %   Neutro Abs 3.5 1.7 - 7.7 K/uL   Lymphocytes Relative 25 %   Lymphs Abs 1.2 0.7 - 4.0 K/uL   Monocytes Relative 6 %   Monocytes Absolute 0.3 0.1 - 1.0 K/uL   Eosinophils Relative 1 %   Eosinophils Absolute 0.0 0.0 - 0.5 K/uL   Basophils Relative 0 %   Basophils Absolute 0.0 0.0 - 0.1 K/uL   Immature Granulocytes 0 %   Abs Immature Granulocytes 0.01 0.00 - 0.07 K/uL    Comment: Performed at [facility], [address] [place], [place] [address]  Comprehensive metabolic panel     Status: Abnormal   Collection Time: 11/25/22  9:25 PM  Result Value Ref Range   Sodium 138 135 - 145 mmol/L   Potassium 3.2 (L) 3.5 - 5.1 mmol/L   Chloride 108 98 - 111 mmol/L   CO2 21 (L) 22 - 32 mmol/L   Glucose, Bld 118 (H) 70 - 99 mg/dL    Comment: Glucose reference range applies only to samples taken after fasting for at least 8 hours.   BUN 14 8 - 23 mg/dL   Creatinine, Ser 6.37 0.61 - 1.24 mg/dL   Calcium 8.5 (L) 8.9 - 10.3 mg/dL   Total Protein 7.6 6.5 - 8.1 g/dL   Albumin 3.6 3.5 - 5.0 g/dL   AST 21 15 - 41 U/L   ALT 13 0 - 44 U/L   Alkaline Phosphatase 61 38 - 126 U/L   Total Bilirubin 0.7 0.3 - 1.2 mg/dL   GFR, Estimated >85 >88 mL/min    Comment: (NOTE) Calculated using the CKD-EPI Creatinine Equation (2021)    Anion gap 9 5 - 15    Comment: Performed at [facility], [address], [place], [place] [address]  Troponin I (High Sensitivity)     Status: None   Collection Time: 11/25/22  9:25 PM  Result Value Ref Range   Troponin I (High Sensitivity) 12 <18 ng/L    Comment: (NOTE) Elevated high sensitivity troponin I (hsTnI) values and significant  changes across serial measurements may suggest ACS but many other  chronic and acute conditions are known to elevate hsTnI results.  Refer to the "Links" section for chest pain algorithms and additional  guidance. Performed at [facility], [address], [place], [place] [address]   CBG monitoring, ED     Status: Abnormal   Collection Time: 11/25/22  9:47 PM  Result Value Ref Range   Glucose-Capillary 107 (H) 70 - 99 mg/dL    Comment: Glucose reference range applies only to samples taken after fasting for at least 8 hours.   DG Chest Port 1 View  Result Date: 11/26/2022 CLINICAL DATA:  Syncope, elevated troponins. EXAM: PORTABLE CHEST 1 VIEW COMPARISON:  09/08/2019. FINDINGS: The  heart size and mediastinal contours are within normal limits. There is mild atelectasis at the left lung base. No effusion or pneumothorax. Cervical spinal fusion hardware is noted. Neurostimulator leads are present over the thoracic spinal canal. IMPRESSION:  Mild atelectasis at the left lung base. Electronically Signed   By: Thornell SartoriusLaura  Taylor M.D.   On: 11/26/2022 01:19    Pending Labs Unresulted Labs (From admission, onward)     Start     Ordered   12/03/22 0500  Creatinine, serum  (enoxaparin (LOVENOX)    CrCl >/= 30 ml/min)  Weekly,   R     Comments: while on enoxaparin therapy    11/26/22 0143   11/26/22 0143  CBC  (enoxaparin (LOVENOX)    CrCl >/= 30 ml/min)  Once,   R       Comments: Baseline for enoxaparin therapy IF NOT ALREADY DRAWN.  Notify MD if PLT < 100 K.    11/26/22 0143   11/26/22 0143  Creatinine, serum  (enoxaparin (LOVENOX)    CrCl >/= 30 ml/min)  Once,   R       Comments: Baseline for enoxaparin therapy IF NOT ALREADY DRAWN.    11/26/22 0143            Vitals/Pain Today's Vitals   11/25/22 2051 11/25/22 2056 11/26/22 0015 11/26/22 0132  BP:  130/70 (!) 146/96   Pulse:  73 65   Resp:  12 13   Temp:  97.8 F (36.6 C)  97.8 F (36.6 C)  TempSrc:  Oral    SpO2: 99% 99% 99%     Isolation Precautions No active isolations  Medications Medications  enoxaparin (LOVENOX) injection 40 mg (has no administration in time range)  aspirin EC tablet 81 mg (has no administration in time range)  atorvastatin (LIPITOR) tablet 20 mg (has no administration in time range)  ferrous sulfate tablet 325 mg (has no administration in time range)  Fish Oil CAPS 1,000 mg (has no administration in time range)  pregabalin (LYRICA) capsule 100 mg (has no administration in time range)  irbesartan (AVAPRO) tablet 37.5 mg (has no administration in time range)  amLODipine (NORVASC) tablet 10 mg (has no administration in time range)  acetaminophen (TYLENOL) tablet 1,000 mg (1,000 mg Oral  Given 11/26/22 0127)    Mobility walks with device     Focused Assessments Cardiac Assessment Handoff:    Lab Results  Component Value Date   CKTOTAL 261 (H) 08/22/2013   TROPONINI <0.03 10/07/2017   Lab Results  Component Value Date   DDIMER 2.29 (H) 08/31/2019   Does the Patient currently have chest pain? No    R Recommendations: See Admitting Provider Note  Report given to:   Additional Notes: Aaox4, ambulates with walker.

## 2022-11-26 NOTE — Progress Notes (Signed)
  Patient received from ED.    11/26/22 0247  Vitals  Temp 97.7 F (36.5 C)  Temp Source Oral  BP (!) 162/80  MAP (mmHg) 105  BP Location Left Arm  BP Method Automatic  Patient Position (if appropriate) Lying  Pulse Rate 64  Pulse Rate Source Dinamap  Resp 18  MEWS COLOR  MEWS Score Color Green  Oxygen Therapy  SpO2 100 %  O2 Device Room Air

## 2022-11-26 NOTE — Progress Notes (Signed)
Physical Therapy Treatment Patient Details Name: Luis Poole MRN: [id number] DOB: 1941/02/10 Today's Date: 11/26/2022   History of Present Illness Luis Poole is a 82 y.o. male with medical history significant for prior CVA, peripheral neuropathy, s/p spinal stimulator placement, peripheral artery disease, left knee surgeries, hypertension, hyperlipidemia, iron deficiency anemia, who presented to [facility] ED 11/25/22  via EMS after an episode of lightheadedness after walking 2 miles.    PT Comments    Patient ambulated this PM and is very unsteady. Decreased LLE control, especially when fatigued.  Patient wearing L AFO. Noted with decreased L foot clearance multiple times, trunk is  very flexed over rollator(more than previous standing- most likely due to fatigue.)   Patient is high fall risk and is not near his baseline. Patient would like to consider short rehab   prior to return home to get back to his independent level with rollator.  Recommendations for follow up therapy are one component of a multi-disciplinary discharge planning process, led by the attending physician.  Recommendations may be updated based on patient status, additional functional criteria and insurance authorization.  Follow Up Recommendations  Can patient physically be transported by private vehicle: Yes    Assistance Recommended at Discharge Frequent or constant Supervision/Assistance  Patient can return home with the following A little help with bathing/dressing/bathroom;Assist for transportation;Help with stairs or ramp for entrance;A lot of help with walking and/or transfers;Assistance with cooking/housework   Equipment Recommendations  Rollator (4 wheels)    Recommendations for Other Services       Precautions / Restrictions Precautions Precaution Comments: uses briefs for incontinence, left brake on rollator does not lock Required Braces or Orthoses: Other Brace Other Brace: L AFO Restrictions Weight  Bearing Restrictions: No     Mobility  Bed Mobility Overal bed mobility: Independent Bed Mobility: Supine to Sit     Supine to sit: Supervision     General bed mobility comments: extra time to scoot to bed edge, trunk flexed    Transfers Overall transfer level: Needs assistance Equipment used: Rollator (4 wheels) Transfers: Sit to/from Stand Sit to Stand: Min assist, From elevated surface           General transfer comment: steady assistance to rise from bed, cues for safety when returned from amb and was getting fatigued    Ambulation/Gait Ambulation/Gait assistance: Mod assist, +2 safety/equipment Gait Distance (Feet): 45 Feet Assistive device: Rollator (4 wheels) Gait Pattern/deviations: Step-to pattern, Trunk flexed, Decreased step length - left Gait velocity: decr     General Gait Details: trunk  flexed, first 20' with min assistance and close guard of the left knee.. 25' more mod assistnace and closer guard and blocking left knee, decreased Left leg swing and at times scuffs the  right foot   Stairs             Wheelchair Mobility    Modified Rankin (Stroke Patients Only)       Balance Overall balance assessment: Needs assistance Sitting-balance support: Feet supported, No upper extremity supported Sitting balance-Leahy Scale: Good Sitting balance - Comments: can reach shoes and adjust, reports able to tie shoelaces   Standing balance support: Bilateral upper extremity supported, Reliant on assistive device for balance, During functional activity Standing balance-Leahy Scale: Poor Standing balance comment: during amb  at end of distance  Cognition Arousal/Alertness: Awake/alert Behavior During Therapy: WFL for tasks assessed/performed Overall Cognitive Status: Within Functional Limits for tasks assessed                                          Exercises      General Comments         Pertinent Vitals/Pain Pain Assessment Pain Assessment: Faces Faces Pain Scale: Hurts even more Pain Location: left knee Pain Descriptors / Indicators: Aching, Discomfort Pain Intervention(s): Monitored during session    Home Living                          Prior Function            PT Goals (current goals can now be found in the care plan section) Progress towards PT goals: Progressing toward goals    Frequency    Min 3X/week      PT Plan Discharge plan needs to be updated    Co-evaluation              AM-PAC PT "6 Clicks" Mobility   Outcome Measure  Help needed turning from your back to your side while in a flat bed without using bedrails?: None Help needed moving from lying on your back to sitting on the side of a flat bed without using bedrails?: None   Help needed standing up from a chair using your arms (e.g., wheelchair or bedside chair)?: A Little Help needed to walk in hospital room?: A Lot Help needed climbing 3-5 steps with a railing? : Total 6 Click Score: 14    End of Session Equipment Utilized During Treatment: Gait belt Activity Tolerance: Patient tolerated treatment well;Patient limited by fatigue Patient left: in bed;with call bell/phone within reach;with nursing/sitter in room Nurse Communication: Mobility status PT Visit Diagnosis: Unsteadiness on feet (R26.81);Difficulty in walking, not elsewhere classified (R26.2);Pain;Other abnormalities of gait and mobility (R26.89);Repeated falls (R29.6) Pain - Right/Left: Left Pain - part of body: Knee     Time: 3300-7622 PT Time Calculation (min) (ACUTE ONLY): 24 min  Charges:  $Gait Training: 23-37 mins                     Blanchard Kelch PT Acute Rehabilitation Services Office [phone] Weekend [phone]    Luis Poole 11/26/2022, 2:19 PM

## 2022-11-26 NOTE — Progress Notes (Signed)
   11/26/22 1159  Vitals  Temp 97.6 F (36.4 C)  Temp Source Oral  BP 122/68  MAP (mmHg) 84  BP Location Left Arm  BP Method Automatic  Patient Position (if appropriate) Lying  Pulse Rate (!) 53  Pulse Rate Source Monitor  Resp 16  MEWS COLOR  MEWS Score Color Green  Orthostatic Lying   BP- Lying 122/68  Pulse- Lying 53  Orthostatic Sitting  BP- Sitting 125/63  Pulse- Sitting 53  Orthostatic Standing at 0 minutes  BP- Standing at 0 minutes 125/60  Pulse- Standing at 0 minutes 63  Oxygen Therapy  SpO2 100 %  O2 Device Room Air  MEWS Score  MEWS Temp 0  MEWS Systolic 0  MEWS Pulse 0  MEWS RR 0  MEWS LOC 0  MEWS Score 0   Orthostatic VS completed. Patient unable to stand for 3 minutes d/t "weak legs." Will continue to monitor.

## 2022-11-26 NOTE — Consult Note (Signed)
Cardiology Consultation   Patient ID: [name] MRN: [id number]; DOB: 09/20/1940  Admit date: 11/25/2022 Date of Consult: 11/26/2022  PCP:  Tracey Harries, MD   [facility] Providers Cardiologist:  Charlton Haws, MD    Patient Profile:   Luis Poole is a 82 y.o. male with a hx of PAD, HTN, HLD, peripheral neuropathy s/p spinal stimulator placement, history of CVA who is being seen 11/26/2022 for the evaluation of syncope, elevated troponin at the request of Dr. Hanley Ben.  History of Present Illness:   Luis Poole is a 82 year old male with above medical history who is followed by Dr. Eden Emms (note, he has not been seen by cardiology since 2019). Per chart review, patient was admitted in 09/2017 for evaluation of weakness and dizziness. Echocardiogram on 10/07/17 showed EF 65-70% with moderate LVH, no regional wall motion abnormalities, grade I diastolic dysfunction. Carotid ultrasounds on 10/07/17 showed 1-39% stenosis in bilateral internal carotid arteries. Nuclear stress test on 10/08/17 was a normal, low risk study. It was suspected that his episode was related to poor balance and previous stoke.  He was later seen by [facility] cardiology in 02/2019 for PAD. He had a non-healing wound on his left heel, and underwent angiogram of the left leg on 02/26/19 with successful angioplasty of the left peroneal artery. Does not appear he has been seen by cardiology since that time.   Patient presented to the EF on 4/8 after had an episode of near syncope after a 2 mild walk. Initial BP in the ED was 106/68. He was given IV fluids and BP improved to 150/84. Labs in the ED showed K 3.2, creatinine 1.15, WBC 5.1, hemoglobin 11.7, platelets 177. hsTn 26>12>28>39. CXR showed mild atelectasis in the left lung base.   Cardiology consulted for evaluation of near syncope and elevated troponin.   Today, patient reports that he is feeling well. He believes that he felt near syncopal because he had over  exerted himself. Normally, he only walks around his home and will occasionally walk down his driveway to get picked up for his doctors appointments. Otherwise, he is very sedentary. Yesterday, patient felt energetic and decided to walk to [facility], which is about a 2 mile walk. While walking, he had to stop multiple times to rest. When he got to [facility], he felt "woozy" and sweaty. Denies chest pain, palpitations, shortness of breath. He did have some nausea. Did feel very tired, and had weakness in his left knee. He did not have true syncope. He denies having issues with dizziness, near syncope, or syncope. Last time he felt dizzy was in 2019 and he had come to the hospital for evaluation.    Past Medical History:  Diagnosis Date   Anemia, unspecified 06/08/2013   Cataract    CHF (congestive heart failure)    Chronic kidney disease    Diabetes mellitus    Foot drop, left foot    AFO   Hypertension    PAD (peripheral artery disease)    S/P lumbar fusion    Sequelae of cerebral infarction    Stroke     Past Surgical History:  Procedure Laterality Date   CATARACT EXTRACTION     EYE SURGERY     KNEE SURGERY  2006   NECK SURGERY  2012   SPINE SURGERY  2009   TONSILECTOMY, ADENOIDECTOMY, BILATERAL MYRINGOTOMY AND TUBES       Home Medications:  Prior to Admission medications  Medication Sig Start Date End Date Taking? Authorizing Provider  amLODipine (NORVASC) 10 MG tablet Take 10 mg by mouth daily.     [provider]  aspirin EC 81 MG tablet Take 81 mg by mouth daily.    [provider]  atorvastatin (LIPITOR) 20 MG tablet Take 1 tablet (20 mg total) by mouth daily. 10/09/17   Burnadette Pop, MD  colchicine 0.6 MG tablet Take 1 tablet (0.6 mg total) daily by mouth. Patient taking differently: Take 0.6 mg by mouth daily as needed (gout pain). 06/29/17   Maxie Barb, MD  ferrous sulfate 325 (65 FE) MG tablet Take 325 mg by mouth daily with breakfast.     [provider]  furosemide (LASIX) 20 MG tablet Take 1 tablet (20 mg total) by mouth daily. 09/24/17   Elpidio Anis, PA-C  Omega-3 Fatty Acids (FISH OIL) 1000 MG CAPS Take 1,000 mg by mouth daily.    [provider]  pregabalin (LYRICA) 100 MG capsule Take 100 mg by mouth 2 (two) times daily.    [provider]  valsartan (DIOVAN) 160 MG tablet Take 160 mg daily by mouth.    [provider]    Inpatient Medications: Scheduled Meds:  amLODipine  10 mg Oral Daily   aspirin EC  81 mg Oral Daily   atorvastatin  20 mg Oral Daily   enoxaparin (LOVENOX) injection  40 mg Subcutaneous Q24H   ferrous sulfate  325 mg Oral Q breakfast   irbesartan  37.5 mg Oral Daily   potassium chloride  40 mEq Oral Q4H   pregabalin  100 mg Oral BID   Continuous Infusions:  PRN Meds: acetaminophen, melatonin, ondansetron (ZOFRAN) IV, polyethylene glycol  Allergies:    Allergies  Allergen Reactions   Ibuprofen Itching   Lisinopril Anaphylaxis   Pregabalin Swelling   Gabapentin Other (See Comments)    Dizziness    Social History:   Social History   Socioeconomic History   Marital status: Single    Spouse name: Not on file   Number of children: 3   Years of education: Not on file   Highest education level: 12th grade  Occupational History    Comment: housekeeping  Tobacco Use   Smoking status: Former   Smokeless tobacco: Former  Building services engineer Use: Never used  Substance and Sexual Activity   Alcohol use: No   Drug use: No   Sexual activity: Not on file  Other Topics Concern   Not on file  Social History Narrative   Lives with his children   Left handed   Caffeine: occas 1 soda mixed w water.    Social Determinants of Health   Financial Resource Strain: Not on file  Food Insecurity: No Food Insecurity (11/26/2022)   Hunger Vital Sign    Worried About Running Out of Food in the Last Year: Never true    Ran Out of Food in the Last Year: Never  true  Transportation Needs: No Transportation Needs (11/26/2022)   PRAPARE - Administrator, Civil Service (Medical): No    Lack of Transportation (Non-Medical): No  Physical Activity: Not on file  Stress: Not on file  Social Connections: Not on file  Intimate Partner Violence: Not At Risk (11/26/2022)   Humiliation, Afraid, Rape, and Kick questionnaire    Fear of Current or Ex-Partner: No    Emotionally Abused: No    Physically Abused: No    Sexually Abused:  No    Family History:    Family History  Problem Relation Age of Onset   Stroke Mother      ROS:  Please see the history of present illness.   All other ROS reviewed and negative.     Physical Exam/Data:   Vitals:   11/26/22 0015 11/26/22 0132 11/26/22 0247 11/26/22 0649  BP: (!) 146/96  (!) 162/80 (!) 162/85  Pulse: 65  64 (!) 55  Resp: 13  18   Temp:  97.8 F (36.6 C) 97.7 F (36.5 C) 97.7 F (36.5 C)  TempSrc:   Oral Axillary  SpO2: 99%  100% 100%  Weight:   88.2 kg   Height:   5\' 7"  (1.702 m)     Intake/Output Summary (Last 24 hours) at 11/26/2022 0839 Last data filed at 11/26/2022 0459 Gross per 24 hour  Intake 640.09 ml  Output --  Net 640.09 ml      11/26/2022    2:47 AM 02/17/2022    3:32 AM 09/21/2021   11:17 AM  Last 3 Weights  Weight (lbs) 194 lb 6.4 oz 217 lb 214 lb  Weight (kg) 88.179 kg 98.431 kg 97.07 kg     Body mass index is 30.45 kg/m.  General:  Elderly male, sitting comfortably in the armchair. No acute distress  HEENT: normal Neck: no JVD Vascular: No carotid bruits; Radial pulses 2+ bilaterally Cardiac:  normal S1, S2; RRR; no murmur  Lungs:  clear to auscultation bilaterally, no wheezing, rhonchi or rales. Normal WOB on room air  Abd: soft, nontender Ext: no edema in BLE.  Musculoskeletal:  Has a brace on his left knee  Skin: warm and dry  Neuro:  CNs 2-12 intact, no focal abnormalities noted Psych:  Normal affect   EKG:  The EKG was personally reviewed and  demonstrates:  Sinus rhythm, 1st degree AV block, RBBB. HR 63 BPM  Telemetry:  Telemetry was personally reviewed and demonstrates:  NSR, HR in the 50s-70s. Occasional PACs   Relevant CV Studies:   Laboratory Data:  High Sensitivity Troponin:   Recent Labs  Lab 11/25/22 0006 11/25/22 2125 11/26/22 0206 11/26/22 0529  TROPONINIHS 26* 12 28* 39*     Chemistry Recent Labs  Lab 11/25/22 2125 11/26/22 0206 11/26/22 0209 11/26/22 0529  NA 138  --   --  139  K 3.2*  --   --  3.2*  CL 108  --   --  107  CO2 21*  --   --  24  GLUCOSE 118*  --   --  105*  BUN 14  --   --  13  CREATININE 1.15 0.98  --  1.10  CALCIUM 8.5*  --   --  8.6*  MG  --   --  1.6*  --   GFRNONAA >60 >60  --  >60  ANIONGAP 9  --   --  8    Recent Labs  Lab 11/25/22 2125  PROT 7.6  ALBUMIN 3.6  AST 21  ALT 13  ALKPHOS 61  BILITOT 0.7   Lipids No results for input(s): "CHOL", "TRIG", "HDL", "LABVLDL", "LDLCALC", "CHOLHDL" in the last 168 hours.  Hematology Recent Labs  Lab 11/25/22 2125 11/26/22 0206  WBC 5.1 5.8  RBC 4.18* 4.03*  HGB 11.7* 11.4*  HCT 36.2* 35.0*  MCV 86.6 86.8  MCH 28.0 28.3  MCHC 32.3 32.6  RDW 14.1 14.2  PLT 177 185   Thyroid No results for  input(s): "TSH", "FREET4" in the last 168 hours.  BNPNo results for input(s): "BNP", "PROBNP" in the last 168 hours.  DDimer No results for input(s): "DDIMER" in the last 168 hours.   Radiology/Studies:  DG Chest Port 1 View  Result Date: 11/26/2022 CLINICAL DATA:  Syncope, elevated troponins. EXAM: PORTABLE CHEST 1 VIEW COMPARISON:  09/08/2019. FINDINGS: The heart size and mediastinal contours are within normal limits. There is mild atelectasis at the left lung base. No effusion or pneumothorax. Cervical spinal fusion hardware is noted. Neurostimulator leads are present over the thoracic spinal canal. IMPRESSION: Mild atelectasis at the left lung base. Electronically Signed   By: Thornell SartoriusLaura  Taylor M.D.   On: 11/26/2022 01:19      Assessment and Plan:   Near Syncope  - Patient came to the ED because he felt "woozy" and sweaty after a 2 mile walk. He is normally very sedentary, and does not walk outside of his home. He had to stop to rest many times on his walk. Denies chest pain, sob, palpitations, syncope.  - Suspect that patient's symptoms are due to overexertion. He has not had issues with dizziness since 2019 and denies ever having true syncope - Echocardiogram pending  - Per telemetry, patient has been maintaining NSR with HR in the 50s-70s. If symptoms reoccur, can consider an outpatient cardiac monitor  - No carotid bruits on exam. Last carotid ultrasounds from 2019 showed 1-30% stenosis bilaterally - Orthostatic vital signs are pending- as patient did receive IV fluids in the ED, they may not be accurate  - Agree with holding lasix for now  - PT to see   Myocardial Injury  - hsTn 26>12>28>39. Trend not consistent with ACS  - Patient denies chest pain. Even when walking yesterday, he did not have chest pain or excessive SOB  - Echo pending  - Suspect this is myocardial injury secondary to strenuous exercise   HTN  - Continue amlodipine 10 mg daily, irbesartan 37.5 mg daily   PAD  - Patient had successful angioplasty of the left peroneal artery in 2020  - Continue ASA, lipitor   Hypokalemia  - Initial K 3.2 - k and mag supplementation have been ordered per primary   Risk Assessment/Risk Scores:      For questions or updates, please contact [facility] Please consult [email or web address] for contact info under    Signed, Jonita AlbeeKathleen R. Faizan Geraci, PA-C  11/26/2022 8:39 AM

## 2022-11-26 NOTE — [place] FL2 (Signed)
[place] MEDICAID FL2 LEVEL OF CARE FORM     IDENTIFICATION  Patient Name: Luis Poole Birthdate: 1941-06-12 Sex: male Admission Date (Current Location): 11/25/2022  [facility] and [place] Number:  Producer, television/film/video and Address:  [facility],  [address] [place], [place] [address]      Provider Number: [id number]  Attending Physician Name and Address:  Glade Lloyd, MD  Relative Name and Phone Number:   Luster Landsberg Thompson(sister)[phone])    Current Level of Care: Hospital Recommended Level of Care: Skilled Nursing Facility Prior Approval Number:    Date Approved/Denied:   PASRR Number:  [id number])  Discharge Plan: SNF    Current Diagnoses: Patient Active Problem List   Diagnosis Date Noted   Near syncope 11/26/2022   Syncope 10/06/2017   Chest pain 10/06/2017   Low back pain 06/28/2017   Leg swelling 08/23/2013   Left leg swelling 08/23/2013   Neuropathy 08/23/2013   Acute gout 08/23/2013   D-dimer, elevated 08/23/2013   CKD (chronic kidney disease), stage III (HCC) 08/23/2013   Chronic diastolic heart failure 08/23/2013   Anemia 06/08/2013   Hypertension 08/29/2012   Diabetes mellitus type 2 in nonobese 08/29/2012   History of CVA (cerebrovascular accident) 08/28/2012   L-S radiculopathy 08/28/2012   Lumbar post-laminectomy syndrome 01/15/2012   Thoracic or lumbosacral neuritis or radiculitis, unspecified 01/15/2012   Left hemiparesis (HCC) 01/15/2012   Diabetic peripheral neuropathy 01/15/2012    Orientation RESPIRATION BLADDER Height & Weight     Self, Time, Situation, Place  Normal   Weight: 88.2 kg Height:  5\' 7"  (170.2 cm)  BEHAVIORAL SYMPTOMS/MOOD NEUROLOGICAL BOWEL NUTRITION STATUS      Continent Diet (Heart Healthy)  AMBULATORY STATUS COMMUNICATION OF NEEDS Skin   Limited Assist Verbally Normal                       Personal Care Assistance Level of Assistance  Bathing, Feeding, Dressing Bathing Assistance: Limited  assistance Feeding assistance: Limited assistance Dressing Assistance: Limited assistance     Functional Limitations Info  Sight, Hearing, Speech Sight Info: Impaired (eyeglasses) Hearing Info: Adequate Speech Info: Adequate    SPECIAL CARE FACTORS FREQUENCY  PT (By licensed PT), OT (By licensed OT)     PT Frequency: 5x week OT Frequency: 5x week            Contractures Contractures Info: Not present    Additional Factors Info  Code Status, Allergies Code Status Info: Full Allergies Info: : Ibuprofen, Lisinopril, Pregabalin, Gabapentin           Current Medications (11/26/2022):  This is the current hospital active medication list Current Facility-Administered Medications  Medication Dose Route Frequency Provider Last Rate Last Admin   acetaminophen (TYLENOL) tablet 650 mg  650 mg Oral Q6H PRN Dow Adolph N, DO       amLODipine (NORVASC) tablet 10 mg  10 mg Oral Daily Hanley Ben, Kshitiz, MD   10 mg at 11/26/22 0943   aspirin EC tablet 81 mg  81 mg Oral Daily Darlin Drop, DO   81 mg at 11/26/22 0941   atorvastatin (LIPITOR) tablet 20 mg  20 mg Oral Daily Dow Adolph N, DO   20 mg at 11/26/22 0941   enoxaparin (LOVENOX) injection 40 mg  40 mg Subcutaneous Q24H Dow Adolph N, DO   40 mg at 11/26/22 3646   ferrous sulfate tablet 325 mg  325 mg Oral Q breakfast Dow Adolph  N, DO   325 mg at 11/26/22 0943   meclizine (ANTIVERT) tablet 25 mg  25 mg Oral TID PRN Glade Lloyd, MD       melatonin tablet 5 mg  5 mg Oral QHS PRN Dow Adolph N, DO       ondansetron (ZOFRAN) injection 4 mg  4 mg Intravenous Q6H PRN Hall, Carole N, DO       polyethylene glycol (MIRALAX / GLYCOLAX) packet 17 g  17 g Oral Daily PRN Dow Adolph N, DO       pregabalin (LYRICA) capsule 100 mg  100 mg Oral BID Hall, Carole N, DO   100 mg at 11/26/22 0940     Discharge Medications: Please see discharge summary for a list of discharge medications.  Relevant Imaging Results:  Relevant Lab  Results:   Additional Information SS#[id number]  [phone]  Xaria Judon, Olegario Messier, RN

## 2022-11-26 NOTE — TOC Progression Note (Signed)
Transition of Care [facility]) - Progression Note    Patient Details  Name: Luis Poole MRN: [id number] Date of Birth: 02/23/1941  Transition of [facility]) CM/SW Contact  Keyden Pavlov, Olegario Messier, RN Phone Number: 11/26/2022, 3:52 PM  Clinical Narrative:d/ to ST SNF-patient in agreement to faxing out fl2-await bed offers.       Expected Discharge Plan: Skilled Nursing Facility Barriers to Discharge: No Barriers Identified  Expected Discharge Plan and Services   Discharge Planning Services: CM Consult Post Acute Care Choice: Home Health Living arrangements for the past 2 months: Single Family Home                           HH Arranged: PT HH Agency: [facility] Date [facility] Agency Contacted: 11/26/22 Time HH Agency Contacted: [phone] Representative spoke with at [facility] Agency: Cindie   Social Determinants of Health (SDOH) Interventions SDOH Screenings   Food Insecurity: No Food Insecurity (11/26/2022)  Housing: Low Risk  (11/26/2022)  Transportation Needs: No Transportation Needs (11/26/2022)  Utilities: Not At Risk (11/26/2022)  Tobacco Use: Medium Risk (11/25/2022)    Readmission Risk Interventions     No data to display

## 2022-11-26 NOTE — H&P (Addendum)
History and Physical  Luis Poole PVV:[id number] DOB: Jan 04, 1941 DOA: 11/25/2022  Referring physician: Arbie Cookey  PCP: Tracey Harries, MD  Outpatient Specialists: Vascular surgery Patient coming from: Home.  Chief Complaint: Lightheadedness after walking 2 miles.  HPI: Luis Poole is a 82 y.o. 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  At baseline, uses a walker to ambulate.  The patient states he felt energized today and decided to go outside for a walk.  He usually walks inside his home.  States his doctor told him he needed to walk to stay healthy.  Was in rehab at [facility] about a month ago.  States the last time he walked that far was in 2022.  Felt he may have done too much at once.  Was taking breaks during his walk x 4 times.  Denies any chest pain or shortness of breath during his walk, prior or after his walk.   He walked from his home to [facility] to use the ATM machine in order to buy his adult pampers, iron supplement, body soap, and deodorant.  By the time he arrived at [facility], he felt lightheaded.   No falls.  No loss of consciousness.  He sat on his rolling walker.  EMS was activated.  He had 1 episode of vomiting while in the ambulance.    In the ED, vital signs are stable.  No leukocytosis, afebrile.  Mild hypokalemia and mild non-anion gap metabolic acidosis.  First set troponin 26, repeat 12 per collection time.  No evidence of acute ischemia on twelve-lead EKG.  EDP requested admission due to concern for elevated troponin.  The patient continues to deny having any anginal symptoms.  The patient was admitted by [facility], hospitalist service.  ED Course: Tmax 97.8.  BP 163/78, pulse 67, respiratory 16,  saturation 100% on room air.  Lab studies remarkable for WBC 5.1, hemoglobin 11.7 at baseline, platelet count 177.  Serum potassium 3.2, serum bicarb 21, serum glucose 118.  Review of Systems: Review of systems as noted in the HPI. All other systems reviewed and are negative.   Past Medical History:  Diagnosis Date   Anemia, unspecified 06/08/2013   Cataract    CHF (congestive heart failure)    Chronic kidney disease    Diabetes mellitus    Foot drop, left foot    AFO   Hypertension    PAD (peripheral artery disease)    S/P lumbar fusion    Sequelae of cerebral infarction    Stroke    Past Surgical History:  Procedure Laterality Date   CATARACT EXTRACTION     EYE SURGERY     KNEE SURGERY  2006   NECK SURGERY  2012   SPINE SURGERY  2009   TONSILECTOMY, ADENOIDECTOMY, BILATERAL MYRINGOTOMY AND TUBES      Social History:  reports that he has quit smoking. He has quit using smokeless tobacco. He reports that he does not drink alcohol and does not use drugs.   Allergies  Allergen Reactions   Ibuprofen Itching   Lisinopril Anaphylaxis   Pregabalin Swelling   Gabapentin Other (See Comments)    Dizziness    Family History  Problem Relation Age of Onset   Stroke Mother       Prior to Admission medications  Medication Sig Start Date End Date Taking? Authorizing Provider  amLODipine (NORVASC) 10 MG tablet Take 10 mg by mouth daily.     [provider]  aspirin EC 81 MG tablet Take 81 mg by mouth daily.    [provider]  atorvastatin (LIPITOR) 20 MG tablet Take 1 tablet (20 mg total) by mouth daily. 10/09/17   Burnadette PopAdhikari, Amrit, MD  colchicine 0.6 MG tablet Take 1 tablet (0.6 mg total) daily by mouth. Patient taking differently: Take 0.6 mg by mouth daily as needed (gout pain). 06/29/17   Maxie BarbBhandari, Dron Prasad, MD  ferrous sulfate 325 (65 FE) MG tablet Take 325 mg by mouth daily with breakfast.    [provider]  furosemide (LASIX) 20 MG tablet  Take 1 tablet (20 mg total) by mouth daily. 09/24/17   Elpidio AnisUpstill, Shari, PA-C  Omega-3 Fatty Acids (FISH OIL) 1000 MG CAPS Take 1,000 mg by mouth daily.    [provider]  pregabalin (LYRICA) 100 MG capsule Take 100 mg by mouth 2 (two) times daily.    [provider]  valsartan (DIOVAN) 160 MG tablet Take 160 mg daily by mouth.    [provider]    Physical Exam: BP (!) 146/96   Pulse 65   Temp 97.8 F (36.6 C)   Resp 13   SpO2 99%   General: 82 y.o. year-old male well developed well nourished in no acute distress.  Alert and oriented x3. Cardiovascular: Regular rate and rhythm with no rubs or gallops.  No thyromegaly or JVD noted.  No lower extremity edema. 2/4 pulses in all 4 extremities. Respiratory: Clear to auscultation with no wheezes or rales. Good inspiratory effort. Abdomen: Soft nontender nondistended with normal bowel sounds x4 quadrants. Muskuloskeletal: No cyanosis, clubbing or edema noted bilaterally Neuro: CN II-XII intact, strength, sensation, reflexes Skin: No ulcerative lesions noted or rashes Psychiatry: Judgement and insight appear normal. Mood is appropriate for condition and setting          Labs on Admission:  Basic Metabolic Panel: Recent Labs  Lab 11/25/22 2125  NA 138  K 3.2*  CL 108  CO2 21*  GLUCOSE 118*  BUN 14  CREATININE 1.15  CALCIUM 8.5*   Liver Function Tests: Recent Labs  Lab 11/25/22 2125  AST 21  ALT 13  ALKPHOS 61  BILITOT 0.7  PROT 7.6  ALBUMIN 3.6   No results for input(s): "LIPASE", "AMYLASE" in the last 168 hours. No results for input(s): "AMMONIA" in the last 168 hours. CBC: Recent Labs  Lab 11/25/22 2125  WBC 5.1  NEUTROABS 3.5  HGB 11.7*  HCT 36.2*  MCV 86.6  PLT 177   Cardiac Enzymes: No results for input(s): "CKTOTAL", "CKMB", "CKMBINDEX", "TROPONINI" in the last 168 hours.  BNP (last 3 results) No results for input(s): "BNP" in the last 8760 hours.  ProBNP (last 3 results) No  results for input(s): "PROBNP" in the last 8760 hours.  CBG: Recent Labs  Lab 11/25/22 2147  GLUCAP 107*    Radiological Exams on Admission: DG Chest Port 1 View  Result Date: 11/26/2022 CLINICAL DATA:  Syncope, elevated troponins. EXAM: PORTABLE CHEST 1 VIEW COMPARISON:  09/08/2019. FINDINGS: The heart size and mediastinal contours are within normal limits. There is mild atelectasis at the left lung base. No effusion or pneumothorax. Cervical spinal fusion hardware is noted. Neurostimulator leads are present over the thoracic spinal canal. IMPRESSION: Mild atelectasis at the left lung base. Electronically Signed   By:  Thornell Sartorius M.D.   On: 11/26/2022 01:19    EKG: I independently viewed the EKG done and my findings are as followed: Normal sinus rhythm rate of 63.  Nonspecific ST-T changes.  QTc 459.  Assessment/Plan Present on Admission:  Near syncope  Principal Problem:   Near syncope  Near syncope, likely from overexertion Obtain orthostatic vital signs Hold off home p.o. Lasix for now Encourage protein calorie intake PT evaluation in the AM and Fall precautions  Elevated troponin, suspect from transient tachycardia Tachycardia has now resolved, at the time of this visit, in sinus rhythm Denies any anginal symptoms Troponin peaked at 26, cycle trops No evidence of acute ischemia on twelve-lead EKG. Monitor on telemetry  Prediabetes Last A1c 5.9 on 04/22/2021 Blood sugar stable  Hypokalemia Serum potassium 3.2 Repleted orally Obtain magnesium level  Iron deficiency anemia Resume home ferrous sulfate  Peripheral neuropathy Resume home Lyrica  History of CVA Resume home aspirin and Lipitor  Ambulatory dysfunction Uses a walker at baseline PT assessment prior to discharge   DVT prophylaxis: Subcu Lovenox daily   Code Status: Full code  Family Communication: None at bedside  Disposition Plan: Admitted to telemetry unit  Consults called:  None.  Admission status: Observation status.   Status is: Observation    Darlin Drop MD [facility] Pager [phone]  If 7PM-7AM, please contact night-coverage [email or web address] Password [email or web address]  11/26/2022, 1:44 AM

## 2022-11-26 NOTE — Care Management Obs Status (Signed)
MEDICARE OBSERVATION STATUS NOTIFICATION   Patient Details  Name: DELORE MENNER MRN: [id number] Date of Birth: January 25, 1941   Medicare Observation Status Notification Given:  Yes    MahabirOlegario Messier, RN 11/26/2022, 9:43 AM

## 2022-11-27 DIAGNOSIS — E1151 Type 2 diabetes mellitus with diabetic peripheral angiopathy without gangrene: Secondary | ICD-10-CM | POA: Diagnosis not present

## 2022-11-27 DIAGNOSIS — Z8673 Personal history of transient ischemic attack (TIA), and cerebral infarction without residual deficits: Secondary | ICD-10-CM | POA: Diagnosis not present

## 2022-11-27 DIAGNOSIS — Z7982 Long term (current) use of aspirin: Secondary | ICD-10-CM | POA: Diagnosis not present

## 2022-11-27 DIAGNOSIS — R55 Syncope and collapse: Secondary | ICD-10-CM | POA: Diagnosis not present

## 2022-11-27 LAB — BASIC METABOLIC PANEL
Anion gap: 5 (ref 5–15)
BUN: 17 mg/dL (ref 8–23)
CO2: 23 mmol/L (ref 22–32)
Calcium: 8.5 mg/dL — ABNORMAL LOW (ref 8.9–10.3)
Chloride: 107 mmol/L (ref 98–111)
Creatinine, Ser: 1.16 mg/dL (ref 0.61–1.24)
GFR, Estimated: >60 mL/min (ref >60)
Glucose, Bld: 96 mg/dL (ref 70–99)
Potassium: 4.3 mmol/L (ref 3.5–5.1)
Sodium: 135 mmol/L (ref 135–145)

## 2022-11-27 LAB — MAGNESIUM: Magnesium: 2.3 mg/dL (ref 1.7–2.4)

## 2022-11-27 MED ORDER — ALLOPURINOL 100 MG PO TABS
100.0000 mg | ORAL_TABLET | Freq: Every day | ORAL | Status: DC
  Administered 2022-11-27 – 2022-11-29 (×3): 100 mg via ORAL
  Filled 2022-11-27 (×3): qty 1

## 2022-11-27 NOTE — Evaluation (Signed)
Occupational Therapy Evaluation Patient Details Name: Luis Poole MRN: [id number] DOB: 01-20-1941 Today's Date: 11/27/2022   History of Present Illness [name] is a 82 y.o. male with medical history significant for prior CVA, peripheral neuropathy, s/p spinal stimulator placement, peripheral artery disease, left knee surgeries, hypertension, hyperlipidemia, iron deficiency anemia, who presented to [facility] ED 11/25/22  via EMS after an episode of lightheadedness after walking 2 miles.   Clinical Impression   Patient is a 82 year old male who was admitted for above. Patient was living at home independently at rollator level with son. Patient was noted to have decreased functional activity tolerance, decreased endurance, decreased standing balance, decreased safety awareness, and decreased knowledge of AD/AE impacting participation in ADLs. Patient would continue to benefit from skilled OT services at this time while admitted and after d/c to address noted deficits in order to improve overall safety and independence in ADLs.        Recommendations for follow up therapy are one component of a multi-disciplinary discharge planning process, led by the attending physician.  Recommendations may be updated based on patient status, additional functional criteria and insurance authorization.   Assistance Recommended at Discharge Frequent or constant Supervision/Assistance  Patient can return home with the following A little help with walking and/or transfers;A little help with bathing/dressing/bathroom;Direct supervision/assist for financial management;Help with stairs or ramp for entrance;Assist for transportation;Direct supervision/assist for medications management;Assistance with cooking/housework    Functional Status Assessment  Patient has had a recent decline in their functional status and demonstrates the ability to make significant improvements in function in a reasonable and predictable amount of  time.  Equipment Recommendations  None recommended by OT       Precautions / Restrictions Precautions Precautions: Fall Precaution Comments: uses briefs for incontinence, left brake on rollator does not lock Required Braces or Orthoses: Other Brace Other Brace: L AFO Restrictions Weight Bearing Restrictions: No      Mobility Bed Mobility               General bed mobility comments: patient was seated EOB in room at start of session and transitioned to recliner at end of session           Balance Overall balance assessment: Needs assistance Sitting-balance support: Feet supported, No upper extremity supported Sitting balance-Leahy Scale: Good     Standing balance support: Bilateral upper extremity supported, Reliant on assistive device for balance, During functional activity Standing balance-Leahy Scale: Poor         ADL either performed or assessed with clinical judgement   ADL Overall ADL's : Needs assistance/impaired Eating/Feeding: Modified independent   Grooming: Min guard;Standing Grooming Details (indicate cue type and reason): patient with increaed kyphotic posture and poor safety awareness with rollator in smaller bathroom in room. Upper Body Bathing: Min guard;Sitting   Lower Body Bathing: Minimal assistance;Sitting/lateral leans Lower Body Bathing Details (indicate cue type and reason): safety with reaching feet for simulated task. unable to bring legs to lap for figure four. Upper Body Dressing : Set up;Sitting   Lower Body Dressing: Minimal assistance;Min guard;Sitting/lateral leans;Sit to/from stand Lower Body Dressing Details (indicate cue type and reason): patient was able to don AFO sitting EOB with leaning in half to complete with min guard for safety. patient would need increased A for standing balance. patient reported rollator break on L side is broken for a while now at this point. Toilet Transfer: Minimal assistance;Ambulation;Rollator (4  wheels) Toilet Transfer Details (indicate cue  type and reason): with personal rollator with increased time. Toileting- Clothing Manipulation and Hygiene: Minimal assistance;Sit to/from stand Toileting - Clothing Manipulation Details (indicate cue type and reason): unsteady with balance and safety with rollator that has one break.             Vision Baseline Vision/History: 1 Wears glasses              Pertinent Vitals/Pain Pain Assessment Pain Assessment: Faces Faces Pain Scale: Hurts even more Pain Location: left knee Pain Descriptors / Indicators: Aching, Discomfort Pain Intervention(s): Limited activity within patient's tolerance, Monitored during session     Hand Dominance Right   Extremity/Trunk Assessment Upper Extremity Assessment Upper Extremity Assessment: LUE deficits/detail LUE Deficits / Details: patient able to range to about 90 degrees ABduction and FF. patient elbow WFL, patient reported h.o shoulder injury with surgical intervention from years prior. able to use functionally.   Lower Extremity Assessment Lower Extremity Assessment: Defer to PT evaluation   Cervical / Trunk Assessment Cervical / Trunk Assessment: Kyphotic;Other exceptions Cervical / Trunk Exceptions: stimulator on left back, quite kyphotic but can  extend to more upright with effort   Communication Communication Communication: No difficulties   Cognition Arousal/Alertness: Awake/alert Behavior During Therapy: WFL for tasks assessed/performed Overall Cognitive Status: Within Functional Limits for tasks assessed      General Comments  patient was noted to have some skin breakdown (not open at this time) on the bottom corner of back stimulator site. nurse in room made aware.            Home Living Family/patient expects to be discharged to:: Private residence Living Arrangements: Children Available Help at Discharge: Family;Available PRN/intermittently Type of Home: House Home  Access: Ramped entrance     Home Layout: One level     Bathroom Shower/Tub: Producer, television/film/video: Standard     Home Equipment: Rollator (4 wheels);Shower seat - built in;Grab bars - tub/shower;Wheelchair - manual          Prior Functioning/Environment Prior Level of Function : Independent/Modified Independent             Mobility Comments: uses rolattor ADLs Comments: reports cooking, IADL's, uses adult absorbent undergarments        OT Problem List: Decreased strength;Decreased activity tolerance;Impaired balance (sitting and/or standing);Decreased coordination;Decreased safety awareness;Decreased knowledge of precautions;Impaired UE functional use;Decreased knowledge of use of DME or AE      OT Treatment/Interventions: Therapeutic exercise;DME and/or AE instruction;Self-care/ADL training;Energy conservation;Therapeutic activities;Patient/family education;Balance training    OT Goals(Current goals can be found in the care plan section) Acute Rehab OT Goals Patient Stated Goal: to get better soon OT Goal Formulation: With patient Time For Goal Achievement: 12/11/22 Potential to Achieve Goals: Fair  OT Frequency: Min 2X/week       AM-PAC OT "6 Clicks" Daily Activity     Outcome Measure Help from another person eating meals?: A Little Help from another person taking care of personal grooming?: A Little Help from another person toileting, which includes using toliet, bedpan, or urinal?: A Lot Help from another person bathing (including washing, rinsing, drying)?: A Lot Help from another person to put on and taking off regular upper body clothing?: A Little Help from another person to put on and taking off regular lower body clothing?: A Lot 6 Click Score: 15   End of Session Equipment Utilized During Treatment: Gait belt;Rollator (4 wheels) (personal rollator.) Nurse Communication: Other (comment) (in room for portion of session)  Activity Tolerance:  Patient tolerated treatment well Patient left: in chair;with call bell/phone within reach;with chair alarm set  OT Visit Diagnosis: Unsteadiness on feet (R26.81);Other abnormalities of gait and mobility (R26.89);Muscle weakness (generalized) (M62.81)                Time: [id number] OT Time Calculation (min): 25 min Charges:  OT General Charges $OT Visit: 1 Visit OT Evaluation $OT Eval Low Complexity: 1 Low OT Treatments $Self Care/Home Management : 8-22 mins  Rosalio LoudMolly OTR/L, MS Acute Rehabilitation Department Office# [phone]   Selinda FlavinMolly M Cutler 11/27/2022, 10:23 AM

## 2022-11-27 NOTE — Progress Notes (Signed)
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  The patient decided to walk to [facility] as he had ran out of some things.  This was about a 2 mile walk.  By time he arrived to [facility] he was lightheaded.  EMS was called and he had an episode of vomiting while in the ambulance. In the ED troponin noted to be 26 and then 12.  The patient did not have any chest pain.  He did have a potassium of 3.2 and a bicarb of 21.  He was admitted for further treatment. Subjective:  The patient has no complaints other than weakness in his left leg  Assessment and Plan: Principal Problem:   Near syncope -Felt to be orthostatic-Lasix held and IV fluids given - He claims that he is eating and drinking well - Symptoms have improved  Elevated troponin - Max troponin was 39 - He has been evaluated by cardiology and no further interventions are recommended at this time   -Echo revealed an EF of greater than 75% consistent with dehydration and grade 1 diastolic dysfunction  Electrolyte abnormalities - Potassium 3.2 and magnesium 1.6 - Have been replaced  Left lower extremity weakness - He feels this is worse than his baseline - SNF has been recommended  History of gout - Resume allopurinol  Chronic pain with spinal surgeries - Continue Lyrica  Hypertension - Continue amlodipine- Diovan on hold     Code Status: Full Code Consultants: none Level of Care: Level of care: Telemetry Total time on patient care: 35 min DVT prophylaxis:  enoxaparin (LOVENOX) injection 40 mg Start: 11/26/22 1000     Objective:   Vitals:   11/26/22 1159 11/26/22 2008 11/27/22 0522 11/27/22 1316  BP:  122/68 131/77 (!) 137/92 118/68  Pulse: (!) 53 (!) 57 (!) 58 (!) 54  Resp: 16 18  18   Temp: 97.6 F (36.4 C) 97.6 F (36.4 C) 97.8 F (36.6 C) 97.7 F (36.5 C)  TempSrc: Oral Oral Oral Oral  SpO2: 100% 100% 100% 100%  Weight:      Height:       Filed Weights   11/26/22 0247  Weight: 88.2 kg   Exam: General exam: Appears comfortable  HEENT: oral mucosa moist Respiratory system: Clear to auscultation.  Cardiovascular system: S1 & S2 heard  Gastrointestinal system: Abdomen soft, non-tender, nondistended. Normal bowel sounds   Extremities: No cyanosis, clubbing or edema Neuro: 3/5 weakness in LLE Psychiatry:  Mood & affect appropriate.      CBC: Recent Labs  Lab 11/25/22 2125 11/26/22 0206  WBC 5.1 5.8  NEUTROABS 3.5  --   HGB 11.7* 11.4*  HCT 36.2* 35.0*  MCV 86.6 86.8  PLT 177 185   Basic Metabolic Panel: Recent Labs  Lab 11/25/22 2125 11/26/22 0206 11/26/22 0209 11/26/22 0529 11/27/22 0512  NA 138  --   --  139 135  K 3.2*  --   --  3.2* 4.3  CL 108  --   --  107 107  CO2 21*  --   --  24 23  GLUCOSE 118*  --   --  105* 96  BUN 14  --   --  13 17  CREATININE 1.15 0.98  --  1.10 1.16  CALCIUM 8.5*  --   --  8.6* 8.5*  MG  --   --  1.6*  --  2.3  PHOS  --   --  3.7  --   --    GFR: Estimated Creatinine Clearance: 54.7 mL/min (by C-G formula based on SCr of 1.16 mg/dL).  Scheduled Meds:  allopurinol  100 mg Oral Daily   amLODipine  10 mg Oral Daily   aspirin EC  81 mg Oral Daily   atorvastatin  20 mg Oral Daily   enoxaparin (LOVENOX) injection  40 mg Subcutaneous Q24H   ferrous sulfate  325 mg Oral Q breakfast   pregabalin  100 mg Oral BID   Continuous Infusions: Imaging and lab data was personally reviewed ECHOCARDIOGRAM COMPLETE  Result Date: 11/26/2022    ECHOCARDIOGRAM REPORT   Patient Name:   Luis Poole Date of Exam: 11/26/2022 Medical Rec #:  [id number]     Height:       67.0 in Accession #:    [id number]    Weight:       194.4 lb Date of  Birth:  01/25/1941      BSA:          1.998 m Patient Age:    81 years      BP:           161/85 mmHg Patient Gender: M             HR:           52 bpm. Exam Location:  Inpatient Procedure: 2D Echo, Color Doppler and Cardiac Doppler Indications:    Syncope  History:        Patient has prior history of Echocardiogram examinations, most                 recent 10/07/2017. CHF, Stroke, Signs/Symptoms:Chest Pain and                 Syncope; Risk Factors:Hypertension and Diabetes. CKD.  Sonographer:    Milbert CoulterMelissa Kafa Referring Phys: [id number] Jonita AlbeeKATHLEEN R JOHNSON IMPRESSIONS  1. Left ventricular ejection fraction, by estimation, is >75%. The left ventricle has hyperdynamic function. The left ventricle has no regional wall motion abnormalities. Left ventricular diastolic parameters are consistent with Grade I diastolic dysfunction (impaired relaxation).  2. Right ventricular systolic function is hyperdynamic. The right ventricular size is normal.  3. Left atrial size was moderately dilated.  4. Right atrial size was mildly dilated.  5. The mitral valve is normal in structure. No evidence of mitral valve regurgitation.  6. The aortic valve was not well visualized. There is mild calcification of the aortic valve. There is mild thickening of the aortic valve. Aortic valve regurgitation is trivial. Aortic valve sclerosis/calcification is present, without any evidence of aortic stenosis. Comparison(s): No significant change from prior study. Prior images reviewed side by side. FINDINGS  Left Ventricle: There is a mid-cavity "gradient" up to 2.4 m/s due to hyperdynamic state, but there is no LV outflow obstruction. Left ventricular ejection fraction, by estimation, is >75%. The left ventricle has hyperdynamic function. The left ventricle has no regional wall motion abnormalities. The left ventricular internal cavity size was normal in size. There is borderline concentric left ventricular hypertrophy. Left ventricular diastolic  parameters are consistent with Grade I diastolic dysfunction (impaired relaxation). Normal left ventricular filling pressure. Right Ventricle: The right ventricular size is normal. Right vetricular  wall thickness was not well visualized. Right ventricular systolic function is hyperdynamic. Left Atrium: Left atrial size was moderately dilated. Right Atrium: Right atrial size was mildly dilated. Pericardium: There is no evidence of pericardial effusion. Mitral Valve: The mitral valve is normal in structure. Mild mitral annular calcification. No evidence of mitral valve regurgitation. Tricuspid Valve: The tricuspid valve is normal in structure. Tricuspid valve regurgitation is mild. Aortic Valve: The aortic valve was not well visualized. There is mild calcification of the aortic valve. There is mild thickening of the aortic valve. Aortic valve regurgitation is trivial. Aortic valve sclerosis/calcification is present, without any evidence of aortic stenosis. Pulmonic Valve: The pulmonic valve was grossly normal. Pulmonic valve regurgitation is not visualized. Aorta: The aortic root was not well visualized. IAS/Shunts: The interatrial septum was not well visualized.  LEFT VENTRICLE PLAX 2D LVIDd:         4.10 cm     Diastology LVIDs:         1.80 cm     LV e' medial:    5.71 cm/s LV PW:         1.10 cm     LV E/e' medial:  12.1 LV IVS:        1.10 cm     LV e' lateral:   9.00 cm/s LVOT diam:     2.10 cm     LV E/e' lateral: 7.7 LVOT Area:     3.46 cm  LV Volumes (MOD) LV vol d, MOD A2C: 50.9 ml LV vol d, MOD A4C: 44.4 ml LV vol s, MOD A2C: 11.6 ml LV vol s, MOD A4C: 10.3 ml LV SV MOD A2C:     39.3 ml LV SV MOD A4C:     44.4 ml LV SV MOD BP:      35.5 ml RIGHT VENTRICLE RV S prime:     13.50 cm/s TAPSE (M-mode): 2.2 cm LEFT ATRIUM             Index        RIGHT ATRIUM           Index LA diam:        3.70 cm 1.85 cm/m   RA Area:     21.70 cm LA Vol (A2C):   78.6 ml 39.34 ml/m  RA Volume:   70.50 ml  35.29 ml/m LA Vol  (A4C):   93.9 ml 47.00 ml/m LA Biplane Vol: 88.0 ml 44.04 ml/m  MITRAL VALVE               TRICUSPID VALVE MV Area (PHT): 2.34 cm    TR Peak grad:   22.8 mmHg MV Decel Time: 324 msec    TR Vmax:        239.00 cm/s MV E velocity: 68.90 cm/s MV A velocity: 93.50 cm/s  SHUNTS MV E/A ratio:  0.74        Systemic Diam: 2.10 cm Rachelle Hora Croitoru MD Electronically signed by Thurmon Fair MD Signature Date/Time: 11/26/2022/11:34:28 AM    Final    DG Chest Port 1 View  Result Date: 11/26/2022 CLINICAL DATA:  Syncope, elevated troponins. EXAM: PORTABLE CHEST 1 VIEW COMPARISON:  09/08/2019. FINDINGS: The heart size and mediastinal contours are within normal limits. There is mild atelectasis at the left lung base. No effusion or pneumothorax. Cervical spinal fusion hardware is noted. Neurostimulator leads are present over the thoracic spinal canal. IMPRESSION: Mild atelectasis at the left lung base. Electronically Signed   By: Vernona Rieger  Ladona Ridgel M.D.   On: 11/26/2022 01:19    LOS: 0 days   Author: Calvert Cantor  11/27/2022 4:46 PM  To contact [facility]>   Check the care team in [facility] and look for the attending/consulting [facility] provider listed  Log into [email or web address] and use 's universal password   Go to> "[facility]"  and find provider  If you still have difficulty reaching the provider, please page the [facility] (Director on Call) for the Hospitalists listed on amion

## 2022-11-27 NOTE — Progress Notes (Signed)
Chart reviewed and discussed with Dr. Jacques Navy. Patient did not have any sustained arrhythmias overnight to explain syncope. Had very brief episode of SVT around midnight, no intervention required. There was mild sinus bradycardia during sleeping hours (upper 40s), no significant slow HR or pauses. Daytime rates primarily 50s-60s, sinus rhythm with sinus arrhythmia and occasional PACs. Has very low P wave amplitude on telemetry making it challenging to discern at times but able to see with intensification of axis. EKG done to confirm shows sinus bradycardia/sinus arrhythmia 56bpm with known RBBB, low P wave amplitude with P waves best seen in I, V2.   Please reach out to cardiology for any concerns in the interim.  F/u has been arranged in our office 12/10/22, appt info placed on AVS.

## 2022-11-27 NOTE — TOC Progression Note (Signed)
Transition of Care [facility]) - Progression Note    Patient Details  Name: Luis Poole MRN: [id number] Date of Birth: 09-15-1940  Transition of Care [facility]) CM/SW Contact  Howell Rucks, RN Phone Number: 11/27/2022, 4:08 PM  Clinical Narrative:   Pt selected [facility], rep- Kia notified. Will initiate auth in am.     Expected Discharge Plan: Skilled Nursing Facility Barriers to Discharge: No Barriers Identified  Expected Discharge Plan and Services   Discharge Planning Services: CM Consult Post Acute Care Choice: Home Health Living arrangements for the past 2 months: Single Family Home                           HH Arranged: PT HH Agency: [facility] Date [facility] Agency Contacted: 11/26/22 Time HH Agency Contacted: [phone] Representative spoke with at [facility] Agency: Cindie   Social Determinants of Health (SDOH) Interventions SDOH Screenings   Food Insecurity: No Food Insecurity (11/26/2022)  Housing: Low Risk  (11/26/2022)  Transportation Needs: No Transportation Needs (11/26/2022)  Utilities: Not At Risk (11/26/2022)  Tobacco Use: Medium Risk (11/25/2022)    Readmission Risk Interventions     No data to display

## 2022-11-28 DIAGNOSIS — R55 Syncope and collapse: Secondary | ICD-10-CM | POA: Diagnosis not present

## 2022-11-28 MED ORDER — IRBESARTAN 75 MG PO TABS
37.5000 mg | ORAL_TABLET | Freq: Every day | ORAL | Status: DC
  Administered 2022-11-28 – 2022-11-29 (×2): 37.5 mg via ORAL
  Filled 2022-11-28 (×2): qty 1

## 2022-11-28 NOTE — TOC Progression Note (Signed)
Transition of Care [facility]) - Progression Note    Patient Details  Name: Luis Poole MRN: [id number] Date of Birth: June 13, 1941  Transition of Care [facility]) CM/SW Contact  Odester Nilson, Olegario Messier, RN Phone Number: 11/28/2022, 11:32 AM  Clinical Narrative: Vesta Mixer initiated Berkley Harvey [id number] auth for [facility] rep Kia aware.      Expected Discharge Plan: Skilled Nursing Facility Barriers to Discharge: Insurance Authorization  Expected Discharge Plan and Services   Discharge Planning Services: CM Consult Post Acute Care Choice: Home Health Living arrangements for the past 2 months: Single Family Home                           HH Arranged: PT HH Agency: [facility] Date [facility] Agency Contacted: 11/26/22 Time HH Agency Contacted: [phone] Representative spoke with at [facility] Agency: Cindie   Social Determinants of Health (SDOH) Interventions SDOH Screenings   Food Insecurity: No Food Insecurity (11/26/2022)  Housing: Low Risk  (11/26/2022)  Transportation Needs: No Transportation Needs (11/26/2022)  Utilities: Not At Risk (11/26/2022)  Tobacco Use: Medium Risk (11/25/2022)    Readmission Risk Interventions     No data to display

## 2022-11-28 NOTE — Progress Notes (Signed)
[facility] Progress Note  Patient: Luis Poole     JXB:[id number]  DOA: 11/25/2022   PCP: Tracey Harries, MD       Brief hospital course: 82 year old male with peripheral neuropathy history of CVA, L5-S1 spondylolisthesis with persistent left lower extremity weakness, left TKA, left rotator cuff tear, gout,, ACDF of C5-C6 and C6-C7 and hypertension.  The patient decided to walk to [facility] as he had ran out of some things.  This was about a 2 mile walk.  By time he arrived to [facility] he was lightheaded.  EMS was called and he had an episode of vomiting while in the ambulance. In the ED troponin noted to be 26 and then 12.  The patient did not have any chest pain.  He did have a potassium of 3.2 and a bicarb of 21.  He was admitted for further treatment. Subjective:  No new complaints.   Assessment and Plan: Principal Problem:   Near syncope -Felt to be orthostatic-Lasix held and IV fluids given - He claims that he is eating and drinking well - Symptoms have improved  Elevated troponin - Max troponin was 39 - He has been evaluated by cardiology and no further interventions are recommended at this time   -Echo revealed an EF of greater than 75% consistent with dehydration and grade 1 diastolic dysfunction  Electrolyte abnormalities - Potassium 3.2 and magnesium 1.6 - Have been replaced adequately  Left lower extremity weakness - He feels this is worse than his baseline - SNF has been recommended- awaiting insurance auth  History of gout - Resume allopurinol  Chronic pain with spinal surgeries - Continue Lyrica  Hypertension - Continue amlodipine- resume Diovan today     Code Status: Full Code Consultants: none Level of Care: Level of care: Telemetry Total time on patient care: 35 min DVT prophylaxis:  enoxaparin (LOVENOX) injection 40 mg Start: 11/26/22 1000     Objective:   Vitals:   11/27/22 1952 11/28/22 0500 11/28/22 0607 11/28/22 0946  BP:  134/72  (!) 145/76 (!) 140/72  Pulse: 60  (!) 42   Resp: 17  17   Temp: 98.4 F (36.9 C)  98 F (36.7 C)   TempSrc: Oral  Oral   SpO2: 100%  100%   Weight:  89.8 kg    Height:       Filed Weights   11/26/22 0247 11/28/22 0500  Weight: 88.2 kg 89.8 kg   Exam: General exam: Appears comfortable  HEENT: oral mucosa moist Respiratory system: Clear to auscultation.  Cardiovascular system: S1 & S2 heard  Gastrointestinal system: Abdomen soft, non-tender, nondistended. Normal bowel sounds   Extremities: No cyanosis, clubbing or edema Psychiatry:  Mood & affect appropriate.      CBC: Recent Labs  Lab 11/25/22 2125 11/26/22 0206  WBC 5.1 5.8  NEUTROABS 3.5  --   HGB 11.7* 11.4*  HCT 36.2* 35.0*  MCV 86.6 86.8  PLT 177 185    Basic Metabolic Panel: Recent Labs  Lab 11/25/22 2125 11/26/22 0206 11/26/22 0209 11/26/22 0529 11/27/22 0512  NA 138  --   --  139 135  K 3.2*  --   --  3.2* 4.3  CL 108  --   --  107 107  CO2 21*  --   --  24 23  GLUCOSE 118*  --   --  105* 96  BUN 14  --   --  13 17  CREATININE 1.15 0.98  --  1.10 1.16  CALCIUM 8.5*  --   --  8.6* 8.5*  MG  --   --  1.6*  --  2.3  PHOS  --   --  3.7  --   --     GFR: Estimated Creatinine Clearance: 55.2 mL/min (by C-G formula based on SCr of 1.16 mg/dL).  Scheduled Meds:  allopurinol  100 mg Oral Daily   amLODipine  10 mg Oral Daily   aspirin EC  81 mg Oral Daily   atorvastatin  20 mg Oral Daily   enoxaparin (LOVENOX) injection  40 mg Subcutaneous Q24H   ferrous sulfate  325 mg Oral Q breakfast   pregabalin  100 mg Oral BID   Continuous Infusions: Imaging and lab data was personally reviewed No results found.  LOS: 0 days   Author: Calvert Cantor  11/28/2022 1:10 PM  To contact [facility]>   Check the care team in [facility] and look for the attending/consulting [facility] provider listed  Log into [email or web address] and use [facility]'s universal password   Go to> "[facility]"  and find  provider  If you still have difficulty reaching the provider, please page the [facility] (Director on Call) for the Hospitalists listed on amion

## 2022-11-28 NOTE — Progress Notes (Signed)
Physical Therapy Treatment Patient Details Name: Luis Poole MRN: [id number] DOB: 08/21/1940 Today's Date: 11/28/2022   History of Present Illness Luis Poole is a 82 y.o. male with medical history significant for prior CVA, peripheral neuropathy, s/p spinal stimulator placement, peripheral artery disease, left knee surgeries, hypertension, hyperlipidemia, iron deficiency anemia, who presented to [facility] ED 11/25/22  via EMS after an episode of lightheadedness after walking 2 miles.    PT Comments    General Comments: AxO x 3 very pleasant and self motivated Assisted out of recliner to amb to bathroom.  Assisted with static standing to void.  Assisted with amb in hallway. General Gait Details: using personal Rollator with LEFT forearm resting on walker for increased support.  Attempted redirection.  "this is how I get started".  Assisted with amb to bathroom 8 feet then another 16 feet in hallway.  Unsteady.  Excessive lean on walker.  c/o L LE weakness (wearing AFO).  HIGH FALL RISK. Pt lives home with a disabled Son.  Pt plans to D/C to SNF.  Recommendations for follow up therapy are one component of a multi-disciplinary discharge planning process, led by the attending physician.  Recommendations may be updated based on patient status, additional functional criteria and insurance authorization.  Follow Up Recommendations  Can patient physically be transported by private vehicle: Yes    Assistance Recommended at Discharge Frequent or constant Supervision/Assistance  Patient can return home with the following A little help with bathing/dressing/bathroom;Assist for transportation;Help with stairs or ramp for entrance;A lot of help with walking and/or transfers;Assistance with cooking/housework   Equipment Recommendations  Rollator (4 wheels) (needs a new Rollator if possible)    Recommendations for Other Services       Precautions / Restrictions Precautions Precautions: Fall Precaution  Comments: uses briefs for incontinence, left brake on Pt personal rollator does not lock Required Braces or Orthoses: Other Brace Other Brace: L AFO Restrictions Weight Bearing Restrictions: No     Mobility  Bed Mobility               General bed mobility comments: OOB in recliner    Transfers Overall transfer level: Needs assistance Equipment used: Rollator (4 wheels) Transfers: Sit to/from Stand Sit to Stand: Min assist, Mod assist           General transfer comment: increased time and effort with several attempts due to weakness and L knee pain.    Ambulation/Gait Ambulation/Gait assistance: Mod assist, +2 safety/equipment Gait Distance (Feet): 24 Feet Assistive device: Rollator (4 wheels) Gait Pattern/deviations: Step-to pattern, Trunk flexed, Decreased step length - left Gait velocity: decreased     General Gait Details: using personal Rollator with LEFT forearm resting on walker for increased support.  Attempted redirection.  "this is how I get started".  Assisted with amb to bathroom 8 feet then another 16 feet in hallway.  Unsteady.  Excessive lean on walker.  c/o L LE weakness (wearing AFO).  HIGH FALL RISK.   Stairs             Wheelchair Mobility    Modified Rankin (Stroke Patients Only)       Balance                                            Cognition Arousal/Alertness: Awake/alert Behavior During Therapy: WFL for tasks assessed/performed Overall Cognitive Status:  Within Functional Limits for tasks assessed                                 General Comments: AxO x 3 very pleasant and self motivated        Exercises      General Comments        Pertinent Vitals/Pain Pain Assessment Pain Assessment: Faces Faces Pain Scale: Hurts a little bit Pain Location: left knee Pain Descriptors / Indicators: Aching, Discomfort Pain Intervention(s): Monitored during session, Repositioned    Home Living                           Prior Function            PT Goals (current goals can now be found in the care plan section) Progress towards PT goals: Progressing toward goals    Frequency    Min 1X/week      PT Plan Discharge plan needs to be updated    Co-evaluation              AM-PAC PT "6 Clicks" Mobility   Outcome Measure  Help needed turning from your back to your side while in a flat bed without using bedrails?: A Little Help needed moving from lying on your back to sitting on the side of a flat bed without using bedrails?: A Little Help needed moving to and from a bed to a chair (including a wheelchair)?: A Little Help needed standing up from a chair using your arms (e.g., wheelchair or bedside chair)?: A Little Help needed to walk in hospital room?: A Lot Help needed climbing 3-5 steps with a railing? : Total 6 Click Score: 15    End of Session Equipment Utilized During Treatment: Gait belt Activity Tolerance: Patient limited by fatigue Patient left: in chair;with call bell/phone within reach Nurse Communication: Mobility status PT Visit Diagnosis: Unsteadiness on feet (R26.81);Difficulty in walking, not elsewhere classified (R26.2);Pain;Other abnormalities of gait and mobility (R26.89);Repeated falls (R29.6) Pain - Right/Left: Left Pain - part of body: Knee     Time: 1110-1135 PT Time Calculation (min) (ACUTE ONLY): 25 min  Charges:  $Gait Training: 8-22 mins $Therapeutic Activity: 8-22 mins                    Felecia Shelling  PTA Acute  Rehabilitation Services Office M-F          [phone]

## 2022-11-29 DIAGNOSIS — R55 Syncope and collapse: Secondary | ICD-10-CM | POA: Diagnosis not present

## 2022-11-29 MED ORDER — POLYETHYLENE GLYCOL 3350 17 G PO PACK: 17.0000 g | PACK | Freq: Every day | ORAL | 0 refills | Status: AC | PRN

## 2022-11-29 NOTE — TOC Transition Note (Addendum)
Transition of Care [facility]) - CM/SW Discharge Note   Patient Details  Name: Luis Poole MRN: [id number] Date of Birth: Sep 27, 1940  Transition of Care [facility]) CM/SW Contact:  Lanier Clam, RN Phone Number: 11/29/2022, 11:36 AM   Clinical Narrative:  Iline Oven from [place] health plan Berkley Harvey [place]#[id number];SJG till 4/15-rep Kia aware of d/c to [facility] accepted. D/c summary to be sent once available. Await rm#,tel# [phone] report prior PTAR. No further CM needs.  -12p-going to [facility] rm#[id number],report tel#[phone]. PTAR called. No further CM needs.   Final next level of care: Skilled Nursing Facility Barriers to Discharge: No Barriers Identified   Patient Goals and CMS Choice CMS Medicare.gov Compare Post Acute Care list provided to:: Patient Choice offered to / list presented to : Patient  Discharge Placement                Patient chooses bed at: [facility] Patient to be transferred to facility by: PTAR Name of family member notified: Patient Patient and family notified of of transfer: 11/29/22  Discharge Plan and Services Additional resources added to the After Visit Summary for     Discharge Planning Services: CM Consult Post Acute Care Choice: Home Health                    HH Arranged: [facility] Agency: [facility] Date [facility] Agency Contacted: 11/26/22 Time HH Agency Contacted: [phone] Representative spoke with at [facility] Agency: Cindie  Social Determinants of Health (SDOH) Interventions SDOH Screenings   Food Insecurity: No Food Insecurity (11/26/2022)  Housing: Low Risk  (11/26/2022)  Transportation Needs: No Transportation Needs (11/26/2022)  Utilities: Not At Risk (11/26/2022)  Tobacco Use: Medium Risk (11/25/2022)     Readmission Risk Interventions     No data to display

## 2022-11-29 NOTE — Discharge Summary (Signed)
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   The patient decided to walk to [facility] as he had ran out of some things.  This was about a 2 mile walk.  By time he arrived to [facility] he was lightheaded.  EMS was called and he had an episode of vomiting while in the ambulance. In the ED troponin noted to be 26 and then 12.  The patient did not have any chest pain.  He did have a potassium of 3.2 and a bicarb of 21.  He was admitted for further treatment.  Principal Problem:   Near syncope -Felt to be orthostatic-Lasix held and IV fluids given - He claims that he is eating and drinking well - Symptoms have improved   Active problems: Elevated troponin - Max troponin was 39 - He has been evaluated by cardiology and no further interventions are recommended at this time   -Echo revealed an EF of greater than 75% consistent with dehydration and grade 1 diastolic dysfunction- previous EF 60-65%   Electrolyte abnormalities - Potassium 3.2 and magnesium 1.6 - Have been replaced   Left lower extremity weakness - He feels this is worse than his baseline - SNF has been recommended   History of gout - Allopurinol   Chronic pain with spinal surgeries - Continue Lyrica   Hypertension - Continue amlodipine- Diovan on hold  Obesity  Body mass index is 31 kg/m.        Discharge Instructions  Discharge  Instructions     Diet - low sodium heart healthy   Complete by: As directed    Increase activity slowly   Complete by: As directed       Allergies as of 11/29/2022       Reactions   Ibuprofen Itching   Lisinopril Anaphylaxis   Pregabalin Swelling   Gabapentin Other (See Comments)   Dizziness        Medication List     STOP taking these medications    furosemide 20 MG tablet Commonly known as: LASIX       TAKE these medications    allopurinol 100 MG tablet Commonly known as: ZYLOPRIM Take 100 mg by mouth daily.   amLODipine 10 MG tablet Commonly known as: NORVASC Take 10 mg by mouth daily.   aspirin EC 81 MG tablet Take 81 mg by mouth daily.   atorvastatin 20 MG tablet Commonly known as: LIPITOR Take 1 tablet (20 mg total) by mouth daily.   colchicine 0.6 MG tablet Take 1 tablet (0.6 mg total) daily by mouth. What changed:  when to take this reasons to take this   ferrous sulfate 325 (65 FE) MG tablet Take 325 mg by mouth daily with breakfast.   Fish Oil 1000 MG Caps Take 1,000 mg by mouth daily.   polyethylene glycol 17 g packet Commonly known as: MIRALAX / GLYCOLAX Take 17 g by mouth daily as needed for mild constipation.   pregabalin 100 MG  capsule Commonly known as: LYRICA Take 100 mg by mouth 2 (two) times daily.   valsartan 160 MG tablet Commonly known as: DIOVAN Take 160 mg daily by mouth.               Durable Medical Equipment  (From admission, onward)           Start     Ordered   11/26/22 0932  For home use only DME Walker rolling  Once       Comments: Stand up walker  Question Answer Comment  Walker: With 5 Inch Wheels   Patient needs a walker to treat with the following condition Unsteady gait      11/26/22 0932            Contact information for follow-up providers     Care, [facility] Follow up.   Specialty: Home Health Services Why: [facility] physical therapy Contact information: [address] [place] [address] [phone]         Sharlene Dory, PA-C Follow up.   Specialty: Cardiology Why: [name] - [address] location - cardiology follow-up has been arranged on Tuesday Dec 10, 2022 at 10:55 AM (Arrive by 10:40 AM). Julian Hy is one of our PAs that works with our [employer] team. Contact information: [address] [place] [address] [phone]              Contact information for after-discharge care     Destination     [facility] Preferred SNF .   Service: Skilled Nursing Contact information: [address] [place] [address] [phone]                        The results of significant diagnostics from this hospitalization (including imaging, microbiology, ancillary and laboratory) are listed below for reference.    ECHOCARDIOGRAM COMPLETE  Result Date: 11/26/2022    ECHOCARDIOGRAM REPORT   Patient Name:   Luis Poole Date of Exam: 11/26/2022 Medical Rec #:  [id number]     Height:       67.0 in Accession #:    [id number]    Weight:       194.4 lb Date of Birth:  28-Aug-1940      BSA:          1.998 m Patient Age:    81 years      BP:           161/85 mmHg Patient Gender: M             HR:           52 bpm. Exam Location:  Inpatient Procedure: 2D Echo, Color Doppler and Cardiac Doppler Indications:    Syncope  History:        Patient has prior history of Echocardiogram examinations, most                 recent 10/07/2017. CHF, Stroke, Signs/Symptoms:Chest Pain and                 Syncope; Risk Factors:Hypertension and Diabetes. CKD.  Sonographer:    Milbert Coulter Referring Phys: [id number] Jonita Albee IMPRESSIONS  1. Left ventricular ejection fraction, by estimation, is >75%. The left ventricle has hyperdynamic function. The left ventricle has no regional wall motion abnormalities. Left ventricular diastolic parameters are consistent with Grade I diastolic dysfunction (impaired relaxation).   2. Right ventricular  systolic function is hyperdynamic. The right ventricular size is normal.  3. Left atrial size was moderately dilated.  4. Right atrial size was mildly dilated.  5. The mitral valve is normal in structure. No evidence of mitral valve regurgitation.  6. The aortic valve was not well visualized. There is mild calcification of the aortic valve. There is mild thickening of the aortic valve. Aortic valve regurgitation is trivial. Aortic valve sclerosis/calcification is present, without any evidence of aortic stenosis. Comparison(s): No significant change from prior study. Prior images reviewed side by side. FINDINGS  Left Ventricle: There is a mid-cavity "gradient" up to 2.4 m/s due to hyperdynamic state, but there is no LV outflow obstruction. Left ventricular ejection fraction, by estimation, is >75%. The left ventricle has hyperdynamic function. The left ventricle has no regional wall motion abnormalities. The left ventricular internal cavity size was normal in size. There is borderline concentric left ventricular hypertrophy. Left ventricular diastolic parameters are consistent with Grade I diastolic dysfunction (impaired relaxation). Normal left ventricular filling pressure. Right Ventricle: The right ventricular size is normal. Right vetricular wall thickness was not well visualized. Right ventricular systolic function is hyperdynamic. Left Atrium: Left atrial size was moderately dilated. Right Atrium: Right atrial size was mildly dilated. Pericardium: There is no evidence of pericardial effusion. Mitral Valve: The mitral valve is normal in structure. Mild mitral annular calcification. No evidence of mitral valve regurgitation. Tricuspid Valve: The tricuspid valve is normal in structure. Tricuspid valve regurgitation is mild. Aortic Valve: The aortic valve was not well visualized. There is mild calcification of the aortic valve. There is mild thickening of the aortic valve. Aortic valve  regurgitation is trivial. Aortic valve sclerosis/calcification is present, without any evidence of aortic stenosis. Pulmonic Valve: The pulmonic valve was grossly normal. Pulmonic valve regurgitation is not visualized. Aorta: The aortic root was not well visualized. IAS/Shunts: The interatrial septum was not well visualized.  LEFT VENTRICLE PLAX 2D LVIDd:         4.10 cm     Diastology LVIDs:         1.80 cm     LV e' medial:    5.71 cm/s LV PW:         1.10 cm     LV E/e' medial:  12.1 LV IVS:        1.10 cm     LV e' lateral:   9.00 cm/s LVOT diam:     2.10 cm     LV E/e' lateral: 7.7 LVOT Area:     3.46 cm  LV Volumes (MOD) LV vol d, MOD A2C: 50.9 ml LV vol d, MOD A4C: 44.4 ml LV vol s, MOD A2C: 11.6 ml LV vol s, MOD A4C: 10.3 ml LV SV MOD A2C:     39.3 ml LV SV MOD A4C:     44.4 ml LV SV MOD BP:      35.5 ml RIGHT VENTRICLE RV S prime:     13.50 cm/s TAPSE (M-mode): 2.2 cm LEFT ATRIUM             Index        RIGHT ATRIUM           Index LA diam:        3.70 cm 1.85 cm/m   RA Area:     21.70 cm LA Vol (A2C):   78.6 ml 39.34 ml/m  RA Volume:   70.50 ml  35.29 ml/m LA Vol (A4C):   93.9  ml 47.00 ml/m LA Biplane Vol: 88.0 ml 44.04 ml/m  MITRAL VALVE               TRICUSPID VALVE MV Area (PHT): 2.34 cm    TR Peak grad:   22.8 mmHg MV Decel Time: 324 msec    TR Vmax:        239.00 cm/s MV E velocity: 68.90 cm/s MV A velocity: 93.50 cm/s  SHUNTS MV E/A ratio:  0.74        Systemic Diam: 2.10 cm Thurmon Fair MD Electronically signed by Thurmon Fair MD Signature Date/Time: 11/26/2022/11:34:28 AM    Final    DG Chest Port 1 View  Result Date: 11/26/2022 CLINICAL DATA:  Syncope, elevated troponins. EXAM: PORTABLE CHEST 1 VIEW COMPARISON:  09/08/2019. FINDINGS: The heart size and mediastinal contours are within normal limits. There is mild atelectasis at the left lung base. No effusion or pneumothorax. Cervical spinal fusion hardware is noted. Neurostimulator leads are present over the thoracic spinal canal.  IMPRESSION: Mild atelectasis at the left lung base. Electronically Signed   By: Thornell Sartorius M.D.   On: 11/26/2022 01:19   Labs:   Basic Metabolic Panel: Recent Labs  Lab 11/25/22 2125 11/26/22 0206 11/26/22 0209 11/26/22 0529 11/27/22 0512  NA 138  --   --  139 135  K 3.2*  --   --  3.2* 4.3  CL 108  --   --  107 107  CO2 21*  --   --  24 23  GLUCOSE 118*  --   --  105* 96  BUN 14  --   --  13 17  CREATININE 1.15 0.98  --  1.10 1.16  CALCIUM 8.5*  --   --  8.6* 8.5*  MG  --   --  1.6*  --  2.3  PHOS  --   --  3.7  --   --      CBC: Recent Labs  Lab 11/25/22 2125 11/26/22 0206  WBC 5.1 5.8  NEUTROABS 3.5  --   HGB 11.7* 11.4*  HCT 36.2* 35.0*  MCV 86.6 86.8  PLT 177 185         SIGNED:   Calvert Cantor, MD  [facility] 11/29/2022, 11:37 AM

## 2022-11-29 NOTE — Progress Notes (Signed)
Physical Therapy Treatment Patient Details Name: Luis Poole MRN: [id number] DOB: [birth year] Today's Date: 11/29/2022   History of Present Illness Luis Poole is a 82 y.o. male with medical history significant for prior CVA, peripheral neuropathy, s/p spinal stimulator placement, peripheral artery disease, left knee surgeries, hypertension, hyperlipidemia, iron deficiency anemia, who presented to [facility] ED 11/25/22  via EMS after an episode of lightheadedness after walking 2 miles.    PT Comments    Patient is  progressing in  ambulation and endurance. LLE  continues to lag at times, patient must pay attention for when it does not clear.   Patient quite cheerful and talkative and expresses pleased with going to rehab today.  Recommendations for follow up therapy are one component of a multi-disciplinary discharge planning process, led by the attending physician.  Recommendations may be updated based on patient status, additional functional criteria and insurance authorization.  Follow Up Recommendations  Can patient physically be transported by private vehicle: No    Assistance Recommended at Discharge Frequent or constant Supervision/Assistance  Patient can return home with the following A little help with bathing/dressing/bathroom;Assist for transportation;Help with stairs or ramp for entrance;A lot of help with walking and/or transfers;Assistance with cooking/housework   Equipment Recommendations       Recommendations for Other Services       Precautions / Restrictions Precautions Precautions: Fall Precaution Comments: uses briefs for incontinence, left brake on Pt personal rollator does not lock Required Braces or Orthoses: Other Brace Other Brace: L AFO Restrictions Weight Bearing Restrictions: No     Mobility  Bed Mobility               General bed mobility comments: OOB in recliner    Transfers Overall transfer level: Needs assistance Equipment used: Rollator  (4 wheels) Transfers: Sit to/from Stand Sit to Stand: Min guard                Ambulation/Gait Ambulation/Gait assistance: Editor, commissioning (Feet): 90 Feet Assistive device: Rollator (4 wheels) Gait Pattern/deviations: Step-to pattern, Trunk flexed, Decreased step length - left Gait velocity: decreased     General Gait Details: using personal Rollator with right forearm resting on walker for increased support.  Patient self corrects when L foot does not clear, swing to. patient takes his time,   Social research officer, government [employer] (Stroke Patients Only)       Balance Overall balance assessment: Needs assistance Sitting-balance support: Feet supported, No upper extremity supported Sitting balance-Leahy Scale: Good Sitting balance - Comments: dons shoes independently   Standing balance support: Bilateral upper extremity supported, Reliant on assistive device for balance, During functional activity Standing balance-Leahy Scale: Poor                              Cognition Arousal/Alertness: Awake/alert Behavior During Therapy: WFL for tasks assessed/performed                                   General Comments: AxO x 3 very pleasant and self motivated        Exercises      General Comments        Pertinent Vitals/Pain Pain Assessment Faces Pain Scale: Hurts a little bit Pain Location: left knee Pain Descriptors /  Indicators: Aching, Discomfort Pain Intervention(s): Monitored during session    Home Living                          Prior Function            PT Goals (current goals can now be found in the care plan section) Progress towards PT goals: Progressing toward goals    Frequency    Min 1X/week      PT Plan Current plan remains appropriate    Co-evaluation              AM-PAC PT "6 Clicks" Mobility   Outcome Measure  Help needed turning from your back  to your side while in a flat bed without using bedrails?: A Little Help needed moving from lying on your back to sitting on the side of a flat bed without using bedrails?: A Little Help needed moving to and from a bed to a chair (including a wheelchair)?: A Little Help needed standing up from a chair using your arms (e.g., wheelchair or bedside chair)?: A Little Help needed to walk in hospital room?: A Little Help needed climbing 3-5 steps with a railing? : A Lot 6 Click Score: 17    End of Session Equipment Utilized During Treatment: Gait belt Activity Tolerance: Patient tolerated treatment well Patient left: in chair;with call bell/phone within reach;with nursing/sitter in room Nurse Communication: Mobility status PT Visit Diagnosis: Unsteadiness on feet (R26.81);Difficulty in walking, not elsewhere classified (R26.2);Pain;Other abnormalities of gait and mobility (R26.89);Repeated falls (R29.6) Pain - Right/Left: Left Pain - part of body: Knee     Time: 1610-9604 PT Time Calculation (min) (ACUTE ONLY): 27 min  Charges:  $Gait Training: 23-37 mins                     Blanchard Kelch PT Acute Rehabilitation Services Office [phone] Weekend [phone]    Rada Hay 11/29/2022, 12:55 PM
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
## 2022-12-10 ENCOUNTER — Encounter: Payer: Self-pay | Admitting: Physician Assistant

## 2022-12-10 ENCOUNTER — Ambulatory Visit: Payer: Medicare Other | Attending: Physician Assistant | Admitting: Physician Assistant

## 2022-12-10 VITALS — BP 148/74 | HR 66 | Ht 67.0 in | Wt 197.0 lb

## 2022-12-10 DIAGNOSIS — R55 Syncope and collapse: Secondary | ICD-10-CM | POA: Diagnosis not present

## 2022-12-10 DIAGNOSIS — I10 Essential (primary) hypertension: Secondary | ICD-10-CM | POA: Diagnosis not present

## 2022-12-10 DIAGNOSIS — E785 Hyperlipidemia, unspecified: Secondary | ICD-10-CM

## 2022-12-10 DIAGNOSIS — I2510 Atherosclerotic heart disease of native coronary artery without angina pectoris: Secondary | ICD-10-CM

## 2022-12-10 DIAGNOSIS — E118 Type 2 diabetes mellitus with unspecified complications: Secondary | ICD-10-CM

## 2022-12-10 NOTE — Patient Instructions (Signed)
Medication Instructions:  Your physician recommends that you continue on your current medications as directed. Please refer to the Current Medication list given to you today.  *If you need a refill on your cardiac medications before your next appointment, please call your pharmacy*  Lab Work: BMET--TODAY If you have labs (blood work) drawn today and your tests are completely normal, you will receive your results only by: MyChart Message (if you have MyChart) OR A paper copy in the mail If you have any lab test that is abnormal or we need to change your treatment, we will call you to review the results.  Follow-Up: At [facility], you and your health needs are our priority.  As part of our continuing mission to provide you with exceptional heart care, we have created designated Provider Care Teams.  These Care Teams include your primary Cardiologist (physician) and Advanced Practice Providers (APPs -  Physician Assistants and Nurse Practitioners) who all work together to provide you with the care you need, when you need it.  We recommend signing up for the patient portal called "MyChart".  Sign up information is provided on this After Visit Summary.  MyChart is used to connect with patients for Virtual Visits (Telemedicine).  Patients are able to view lab/test results, encounter notes, upcoming appointments, etc.  Non-urgent messages can be sent to your provider as well.   To learn more about what you can do with MyChart, go to [email or web address].    Your next appointment:   3-4 month(s)  Provider:   Charlton Haws, MD  or Jari Favre, PA-C        Other Instructions Check your blood pressure daily, 1-2 hrs after taking your morning medications for 2 weeks, keep a log and call us with the readings at the end of the 2 weeks.  Low-Sodium Eating Plan Sodium, which is an element that makes up salt, helps you maintain a healthy balance of fluids in your body. Too much sodium can  increase your blood pressure and cause fluid and waste to be held in your body. Your health care provider or dietitian may recommend following this plan if you have high blood pressure (hypertension), kidney disease, liver disease, or heart failure. Eating less sodium can help lower your blood pressure, reduce swelling, and protect your heart, liver, and kidneys. What are tips for following this plan? Reading food labels The Nutrition Facts label lists the amount of sodium in one serving of the food. If you eat more than one serving, you must multiply the listed amount of sodium by the number of servings. Choose foods with less than 140 mg of sodium per serving. Avoid foods with 300 mg of sodium or more per serving. Shopping  Look for lower-sodium products, often labeled as "low-sodium" or "no salt added." Always check the sodium content, even if foods are labeled as "unsalted" or "no salt added." Buy fresh foods. Avoid canned foods and pre-made or frozen meals. Avoid canned, cured, or processed meats. Buy breads that have less than 80 mg of sodium per slice. Cooking  Eat more home-cooked food and less restaurant, buffet, and fast food. Avoid adding salt when cooking. Use salt-free seasonings or herbs instead of table salt or sea salt. Check with your health care provider or pharmacist before using salt substitutes. Cook with plant-based oils, such as canola, sunflower, or olive oil. Meal planning When eating at a restaurant, ask that your food be prepared with less salt or no salt, if possible.  Avoid dishes labeled as brined, pickled, cured, smoked, or made with soy sauce, miso, or teriyaki sauce. Avoid foods that contain MSG (monosodium glutamate). MSG is sometimes added to [place] food, bouillon, and some canned foods. Make meals that can be grilled, baked, poached, roasted, or steamed. These are generally made with less sodium. General information Most people on this plan should limit  their sodium intake to 1,500-2,000 mg (milligrams) of sodium each day. What foods should I eat? Fruits Fresh, frozen, or canned fruit. Fruit juice. Vegetables Fresh or frozen vegetables. "No salt added" canned vegetables. "No salt added" tomato sauce and paste. Low-sodium or reduced-sodium tomato and vegetable juice. Grains Low-sodium cereals, including oats, puffed wheat and rice, and shredded wheat. Low-sodium crackers. Unsalted rice. Unsalted pasta. Low-sodium bread. Whole-grain breads and whole-grain pasta. Meats and other proteins Fresh or frozen (no salt added) meat, poultry, seafood, and fish. Low-sodium canned tuna and salmon. Unsalted nuts. Dried peas, beans, and lentils without added salt. Unsalted canned beans. Eggs. Unsalted nut butters. Dairy Milk. Soy milk. Cheese that is naturally low in sodium, such as ricotta cheese, fresh mozzarella, or Swiss cheese. Low-sodium or reduced-sodium cheese. Cream cheese. Yogurt. Seasonings and condiments Fresh and dried herbs and spices. Salt-free seasonings. Low-sodium mustard and ketchup. Sodium-free salad dressing. Sodium-free light mayonnaise. Fresh or refrigerated horseradish. Lemon juice. Vinegar. Other foods Homemade, reduced-sodium, or low-sodium soups. Unsalted popcorn and pretzels. Low-salt or salt-free chips. The items listed above may not be a complete list of foods and beverages you can eat. Contact a dietitian for more information. What foods should I avoid? Vegetables Sauerkraut, pickled vegetables, and relishes. Olives. [place] fries. Onion rings. Regular canned vegetables (not low-sodium or reduced-sodium). Regular canned tomato sauce and paste (not low-sodium or reduced-sodium). Regular tomato and vegetable juice (not low-sodium or reduced-sodium). Frozen vegetables in sauces. Grains Instant hot cereals. Bread stuffing, pancake, and biscuit mixes. Croutons. Seasoned rice or pasta mixes. Noodle soup cups. Boxed or frozen macaroni  and cheese. Regular salted crackers. Self-rising flour. Meats and other proteins Meat or fish that is salted, canned, smoked, spiced, or pickled. Precooked or cured meat, such as sausages or meat loaves. Tomasa Blase. Ham. Pepperoni. Hot dogs. Corned beef. Chipped beef. Salt pork. Jerky. Pickled herring. Anchovies and sardines. Regular canned tuna. Salted nuts. Dairy Processed cheese and cheese spreads. Hard cheeses. Cheese curds. Blue cheese. Feta cheese. String cheese. Regular cottage cheese. Buttermilk. Canned milk. Fats and oils Salted butter. Regular margarine. Ghee. Bacon fat. Seasonings and condiments Onion salt, garlic salt, seasoned salt, table salt, and sea salt. Canned and packaged gravies. Worcestershire sauce. Tartar sauce. Barbecue sauce. Teriyaki sauce. Soy sauce, including reduced-sodium. Steak sauce. Fish sauce. Oyster sauce. Cocktail sauce. Horseradish that you find on the shelf. Regular ketchup and mustard. Meat flavorings and tenderizers. Bouillon cubes. Hot sauce. Pre-made or packaged marinades. Pre-made or packaged taco seasonings. Relishes. Regular salad dressings. Salsa. Other foods Salted popcorn and pretzels. Corn chips and puffs. Potato and tortilla chips. Canned or dried soups. Pizza. Frozen entrees and pot pies. The items listed above may not be a complete list of foods and beverages you should avoid. Contact a dietitian for more information. Summary Eating less sodium can help lower your blood pressure, reduce swelling, and protect your heart, liver, and kidneys. Most people on this plan should limit their sodium intake to 1,500-2,000 mg (milligrams) of sodium each day. Canned, boxed, and frozen foods are high in sodium. Restaurant foods, fast foods, and pizza are also very high in sodium. You  also get sodium by adding salt to food. Try to cook at home, eat more fresh fruits and vegetables, and eat less fast food and canned, processed, or prepared foods. This information is  not intended to replace advice given to you by your health care provider. Make sure you discuss any questions you have with your health care provider. Document Revised: 07/12/2019 Document Reviewed: 07/07/2019 Elsevier Patient Education  2023 Elsevier Inc.  Heart-Healthy Eating Plan Many factors influence your heart health, including eating and exercise habits. Heart health is also called coronary health. Coronary risk increases with abnormal blood fat (lipid) levels. A heart-healthy eating plan includes limiting unhealthy fats, increasing healthy fats, limiting salt (sodium) intake, and making other diet and lifestyle changes. What is my plan? Your health care provider may recommend that: You limit your fat intake to _________% or less of your total calories each day. You limit your saturated fat intake to _________% or less of your total calories each day. You limit the amount of cholesterol in your diet to less than _________ mg per day. You limit the amount of sodium in your diet to less than _________ mg per day. What are tips for following this plan? Cooking Cook foods using methods other than frying. Baking, boiling, grilling, and broiling are all good options. Other ways to reduce fat include: Removing the skin from poultry. Removing all visible fats from meats. Steaming vegetables in water or broth. Meal planning  At meals, imagine dividing your plate into fourths: Fill one-half of your plate with vegetables and green salads. Fill one-fourth of your plate with whole grains. Fill one-fourth of your plate with lean protein foods. Eat 2-4 cups of vegetables per day. One cup of vegetables equals 1 cup (91 g) broccoli or cauliflower florets, 2 medium carrots, 1 large bell pepper, 1 large sweet potato, 1 large tomato, 1 medium white potato, 2 cups (150 g) raw leafy greens. Eat 1-2 cups of fruit per day. One cup of fruit equals 1 small apple, 1 large banana, 1 cup (237 g) mixed fruit,  1 large orange,  cup (82 g) dried fruit, 1 cup (240 mL) 100% fruit juice. Eat more foods that contain soluble fiber. Examples include apples, broccoli, carrots, beans, peas, and barley. Aim to get 25-30 g of fiber per day. Increase your consumption of legumes, nuts, and seeds to 4-5 servings per week. One serving of dried beans or legumes equals  cup (90 g) cooked, 1 serving of nuts is  oz (12 almonds, 24 pistachios, or 7 walnut halves), and 1 serving of seeds equals  oz (8 g). Fats Choose healthy fats more often. Choose monounsaturated and polyunsaturated fats, such as olive and canola oils, avocado oil, flaxseeds, walnuts, almonds, and seeds. Eat more omega-3 fats. Choose salmon, mackerel, sardines, tuna, flaxseed oil, and ground flaxseeds. Aim to eat fish at least 2 times each week. Check food labels carefully to identify foods with trans fats or high amounts of saturated fat. Limit saturated fats. These are found in animal products, such as meats, butter, and cream. Plant sources of saturated fats include palm oil, palm kernel oil, and coconut oil. Avoid foods with partially hydrogenated oils in them. These contain trans fats. Examples are stick margarine, some tub margarines, cookies, crackers, and other baked goods. Avoid fried foods. General information Eat more home-cooked food and less restaurant, buffet, and fast food. Limit or avoid alcohol. Limit foods that are high in added sugar and simple starches such as foods made  using white refined flour (white breads, pastries, sweets). Lose weight if you are overweight. Losing just 5-10% of your body weight can help your overall health and prevent diseases such as diabetes and heart disease. Monitor your sodium intake, especially if you have high blood pressure. Talk with your health care provider about your sodium intake. Try to incorporate more vegetarian meals weekly. What foods should I eat? Fruits All fresh, canned (in natural juice),  or frozen fruits. Vegetables Fresh or frozen vegetables (raw, steamed, roasted, or grilled). Green salads. Grains Most grains. Choose whole wheat and whole grains most of the time. Rice and pasta, including brown rice and pastas made with whole wheat. Meats and other proteins Lean, well-trimmed beef, veal, pork, and lamb. Chicken and [place] without skin. All fish and shellfish. Wild duck, rabbit, pheasant, and venison. Egg whites or low-cholesterol egg substitutes. Dried beans, peas, lentils, and tofu. Seeds and most nuts. Dairy Low-fat or nonfat cheeses, including ricotta and mozzarella. Skim or 1% milk (liquid, powdered, or evaporated). Buttermilk made with low-fat milk. Nonfat or low-fat yogurt. Fats and oils Non-hydrogenated (trans-free) margarines. Vegetable oils, including soybean, sesame, sunflower, olive, avocado, peanut, safflower, corn, canola, and cottonseed. Salad dressings or mayonnaise made with a vegetable oil. Beverages Water (mineral or sparkling). Coffee and tea. Unsweetened ice tea. Diet beverages. Sweets and desserts Sherbet, gelatin, and fruit ice. Small amounts of dark chocolate. Limit all sweets and desserts. Seasonings and condiments All seasonings and condiments. The items listed above may not be a complete list of foods and beverages you can eat. Contact a dietitian for more options. What foods should I avoid? Fruits Canned fruit in heavy syrup. Fruit in cream or butter sauce. Fried fruit. Limit coconut. Vegetables Vegetables cooked in cheese, cream, or butter sauce. Fried vegetables. Grains Breads made with saturated or trans fats, oils, or whole milk. Croissants. Sweet rolls. Donuts. High-fat crackers, such as cheese crackers and chips. Meats and other proteins Fatty meats, such as hot dogs, ribs, sausage, bacon, rib-eye roast or steak. High-fat deli meats, such as salami and bologna. Caviar. Domestic duck and goose. Organ meats, such as liver. Dairy Cream,  sour cream, cream cheese, and creamed cottage cheese. Whole-milk cheeses. Whole or 2% milk (liquid, evaporated, or condensed). Whole buttermilk. Cream sauce or high-fat cheese sauce. Whole-milk yogurt. Fats and oils Meat fat, or shortening. Cocoa butter, hydrogenated oils, palm oil, coconut oil, palm kernel oil. Solid fats and shortenings, including bacon fat, salt pork, lard, and butter. Nondairy cream substitutes. Salad dressings with cheese or sour cream. Beverages Regular sodas and any drinks with added sugar. Sweets and desserts Frosting. Pudding. Cookies. Cakes. Pies. Milk chocolate or white chocolate. Buttered syrups. Full-fat ice cream or ice cream drinks. The items listed above may not be a complete list of foods and beverages to avoid. Contact a dietitian for more information. Summary Heart-healthy meal planning includes limiting unhealthy fats, increasing healthy fats, limiting salt (sodium) intake and making other diet and lifestyle changes. Lose weight if you are overweight. Losing just 5-10% of your body weight can help your overall health and prevent diseases such as diabetes and heart disease. Focus on eating a balance of foods, including fruits and vegetables, low-fat or nonfat dairy, lean protein, nuts and legumes, whole grains, and heart-healthy oils and fats. This information is not intended to replace advice given to you by your health care provider. Make sure you discuss any questions you have with your health care provider. Document Revised: 09/10/2021 Document Reviewed: 09/10/2021  Elsevier Patient Education  [place].

## 2022-12-11 ENCOUNTER — Telehealth: Payer: Self-pay

## 2022-12-11 LAB — BASIC METABOLIC PANEL
BUN/Creatinine Ratio: 13 (ref 10–24)
BUN: 14 mg/dL (ref 8–27)
CO2: 21 mmol/L (ref 20–29)
Calcium: 9.9 mg/dL (ref 8.6–10.2)
Chloride: 99 mmol/L (ref 96–106)
Creatinine, Ser: 1.1 mg/dL (ref 0.76–1.27)
Glucose: 78 mg/dL (ref 70–99)
Potassium: 4.5 mmol/L (ref 3.5–5.2)
Sodium: 137 mmol/L (ref 134–144)
eGFR: 68 mL/min/{1.73_m2} (ref >59)

## 2022-12-11 NOTE — Telephone Encounter (Signed)
-----   Message from Sharlene Dory, [place] sent at 12/11/2022  9:09 AM EDT ----- Mr. Vivona,   Kidneys look good, electrolytes normal. Overall, good result!  Sharlene Dory, PA-C

## 2022-12-11 NOTE — Telephone Encounter (Signed)
Called patient but voicemail was full. Unable to leave voicemail

## 2022-12-23 ENCOUNTER — Telehealth: Payer: Self-pay | Admitting: Cardiovascular Disease

## 2022-12-23 NOTE — Telephone Encounter (Signed)
The patient has been notified of the result and verbalized understanding.  All questions (if any) were answered. Ethelda Chick, RN 12/23/2022 5:44 PM

## 2022-12-23 NOTE — Telephone Encounter (Signed)
Patient is returning call to discuss lab results. 

## 2022-12-28 ENCOUNTER — Emergency Department (HOSPITAL_COMMUNITY)
Admission: EM | Admit: 2022-12-28 | Discharge: 2022-12-29 | Disposition: A | Discharge status: Home / Self Care | Payer: 59 | Attending: Emergency Medicine | Admitting: Emergency Medicine

## 2022-12-28 ENCOUNTER — Encounter (HOSPITAL_COMMUNITY): Payer: Self-pay

## 2022-12-28 ENCOUNTER — Other Ambulatory Visit: Payer: Self-pay

## 2022-12-28 ENCOUNTER — Emergency Department (HOSPITAL_COMMUNITY): Payer: 59

## 2022-12-28 DIAGNOSIS — G8929 Other chronic pain: Secondary | ICD-10-CM | POA: Diagnosis not present

## 2022-12-28 DIAGNOSIS — Z79899 Other long term (current) drug therapy: Secondary | ICD-10-CM | POA: Diagnosis not present

## 2022-12-28 DIAGNOSIS — I509 Heart failure, unspecified: Secondary | ICD-10-CM | POA: Diagnosis not present

## 2022-12-28 DIAGNOSIS — I130 Hypertensive heart and chronic kidney disease with heart failure and stage 1 through stage 4 chronic kidney disease, or unspecified chronic kidney disease: Secondary | ICD-10-CM | POA: Diagnosis not present

## 2022-12-28 DIAGNOSIS — M5442 Lumbago with sciatica, left side: Secondary | ICD-10-CM | POA: Insufficient documentation

## 2022-12-28 DIAGNOSIS — E119 Type 2 diabetes mellitus without complications: Secondary | ICD-10-CM | POA: Diagnosis not present

## 2022-12-28 DIAGNOSIS — Z7982 Long term (current) use of aspirin: Secondary | ICD-10-CM | POA: Insufficient documentation

## 2022-12-28 DIAGNOSIS — N189 Chronic kidney disease, unspecified: Secondary | ICD-10-CM | POA: Diagnosis not present

## 2022-12-28 DIAGNOSIS — R202 Paresthesia of skin: Secondary | ICD-10-CM | POA: Diagnosis present

## 2022-12-28 LAB — URINALYSIS, ROUTINE W REFLEX MICROSCOPIC
Bilirubin Urine: NEGATIVE
Glucose, UA: NEGATIVE mg/dL
Ketones, ur: NEGATIVE mg/dL
Leukocytes,Ua: NEGATIVE
Nitrite: NEGATIVE
Protein, ur: NEGATIVE mg/dL
Specific Gravity, Urine: 1.02 (ref 1.005–1.030)
pH: 5 (ref 5.0–8.0)

## 2022-12-28 MED ORDER — OXYCODONE-ACETAMINOPHEN 5-325 MG PO TABS: 1.0000 | ORAL_TABLET | Freq: Four times a day (QID) | ORAL | 0 refills | Status: DC | PRN

## 2022-12-28 MED ORDER — FENTANYL CITRATE PF 50 MCG/ML IJ SOSY
50.0000 ug | PREFILLED_SYRINGE | Freq: Once | INTRAMUSCULAR | Status: AC
  Administered 2022-12-28: 50 ug via INTRAMUSCULAR
  Filled 2022-12-28: qty 1

## 2022-12-28 MED ORDER — IBUPROFEN 800 MG PO TABS
800.0000 mg | ORAL_TABLET | Freq: Once | ORAL | Status: DC
  Filled 2022-12-28: qty 1

## 2022-12-28 MED ORDER — CYCLOBENZAPRINE HCL 10 MG PO TABS: 10.0000 mg | ORAL_TABLET | Freq: Two times a day (BID) | ORAL | 0 refills | Status: DC | PRN

## 2022-12-28 MED ORDER — PREDNISONE 20 MG PO TABS
60.0000 mg | ORAL_TABLET | Freq: Once | ORAL | Status: AC
  Administered 2022-12-28: 60 mg via ORAL
  Filled 2022-12-28: qty 3

## 2022-12-28 MED ORDER — CYCLOBENZAPRINE HCL 10 MG PO TABS
10.0000 mg | ORAL_TABLET | Freq: Once | ORAL | Status: AC
  Administered 2022-12-28: 10 mg via ORAL
  Filled 2022-12-28: qty 1

## 2022-12-28 MED ORDER — OXYCODONE-ACETAMINOPHEN 5-325 MG PO TABS
1.0000 | ORAL_TABLET | Freq: Once | ORAL | Status: AC
  Administered 2022-12-28: 1 via ORAL
  Filled 2022-12-28: qty 1

## 2022-12-28 MED ORDER — LIDOCAINE 5 % EX PTCH
1.0000 | MEDICATED_PATCH | CUTANEOUS | Status: DC
  Administered 2022-12-28: 1 via TRANSDERMAL
  Filled 2022-12-28: qty 1

## 2022-12-28 MED ORDER — PREDNISONE 10 MG (21) PO TBPK: ORAL_TABLET | Freq: Every day | ORAL | 0 refills | Status: DC

## 2022-12-28 NOTE — Discharge Instructions (Signed)
Please take your medications as prescribed. Take tylenol/ibuprofen, percocet as needed for pain. I recommend close follow-up with PCP for reevaluation.  Please do not hesitate to return to emergency department if worrisome signs symptoms we discussed become apparent.

## 2022-12-28 NOTE — ED Notes (Signed)
Patient transported to CT 

## 2022-12-28 NOTE — ED Provider Notes (Signed)
[place] [facility] AT [facility] Provider Note   CSN: [id number] Arrival date & time: 12/28/22  1834     History {Add pertinent medical, surgical, social history, OB history to HPI:1} Chief Complaint  Patient presents with  . Back Pain    Luis CUERVO is a 82 y.o. 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  Patient reports worsening low back pain in the last 7 days.  States he has had 2 falls since the end of April.  Denies any dizziness or LOC.  Denies fever, nausea, vomiting, chest pain, shortness of breath.  Denies any urinary symptoms, urinary retention, bowel incontinence, distal weakness.   Back Pain     Past Medical History:  Diagnosis Date  . Anemia, unspecified 06/08/2013  . Cataract   . CHF (congestive heart failure) (HCC)   . Chronic kidney disease   . Diabetes mellitus   . Foot drop, left foot    AFO  . Hypertension   . PAD (peripheral artery disease) (HCC)   . S/P lumbar fusion   . Sequelae of cerebral infarction   . Stroke [facility])    Past Surgical History:  Procedure Laterality Date  . CATARACT EXTRACTION    . EYE SURGERY    . KNEE SURGERY  2006  . NECK SURGERY  2012  . SPINE SURGERY  2009  . TONSILECTOMY, ADENOIDECTOMY, BILATERAL MYRINGOTOMY AND TUBES       Home Medications Prior to Admission medications   Medication Sig Start Date End Date Taking? Authorizing Provider  allopurinol (ZYLOPRIM) 100 MG tablet Take 100 mg by mouth daily.    [provider]  amLODipine (NORVASC) 10 MG tablet Take 10 mg by mouth daily.     [provider]  aspirin EC 81 MG tablet Take 81 mg by mouth daily.    [provider]  atorvastatin (LIPITOR) 20 MG  tablet Take 1 tablet (20 mg total) by mouth daily. 10/09/17   Burnadette Pop, MD  colchicine 0.6 MG tablet Take 1 tablet (0.6 mg total) daily by mouth. Patient taking differently: Take 0.6 mg by mouth daily as needed (gout pain). 06/29/17   Maxie Barb, MD  ferrous sulfate 325 (65 FE) MG tablet Take 325 mg by mouth daily with breakfast.    [provider]  Omega-3 Fatty Acids (FISH OIL) 1000 MG CAPS Take 1,000 mg by mouth daily.    [provider]  polyethylene glycol (MIRALAX / GLYCOLAX) 17 g packet Take 17 g by mouth daily as needed for mild constipation. 11/29/22   Calvert Cantor, MD  pregabalin (LYRICA) 100 MG capsule Take 100 mg by mouth 2 (two) times daily.    [provider]  valsartan (DIOVAN) 160 MG tablet Take 160 mg daily by mouth.    [provider]      Allergies    Ibuprofen, Lisinopril, Pregabalin, and Gabapentin    Review of Systems   Review of Systems  Musculoskeletal:  Positive for back pain.    Physical Exam Updated Vital Signs BP (!) 142/82   Pulse 63   Temp 98.1 F (36.7 C)   Resp 17   SpO2 99%  Physical Exam Vitals and nursing note reviewed.  Constitutional:      Appearance: Normal appearance.  HENT:  Head: Normocephalic and atraumatic.     Mouth/Throat:     Mouth: Mucous membranes are moist.  Eyes:     General: No scleral icterus. Cardiovascular:     Rate and Rhythm: Normal rate and regular rhythm.     Pulses: Normal pulses.     Heart sounds: Normal heart sounds.  Pulmonary:     Effort: Pulmonary effort is normal.     Breath sounds: Normal breath sounds.  Abdominal:     General: Abdomen is flat.     Palpations: Abdomen is soft.     Tenderness: There is no abdominal tenderness.  Musculoskeletal:        General: No deformity.  Skin:    General: Skin is warm.     Findings: No rash.  Neurological:     General: No focal deficit present.     Mental Status: He is alert.  Psychiatric:        Mood and  Affect: Mood normal.    ED Results / Procedures / Treatments   Labs (all labs ordered are listed, but only abnormal results are displayed) Labs Reviewed  URINALYSIS, ROUTINE W REFLEX MICROSCOPIC    EKG None  Radiology No results found.  Procedures Procedures  {Document cardiac monitor, telemetry assessment procedure when appropriate:1}  Medications Ordered in ED Medications  oxyCODONE-acetaminophen (PERCOCET/ROXICET) 5-325 MG per tablet 1 tablet (has no administration in time range)  ibuprofen (ADVIL) tablet 800 mg (has no administration in time range)    ED Course/ Medical Decision Making/ A&P   {   Click here for ABCD2, HEART and other calculatorsREFRESH Note before signing :1}                          Medical Decision Making Amount and/or Complexity of Data Reviewed Labs: ordered. Radiology: ordered.  Risk Prescription drug management.   ***  {Document critical care time when appropriate:1} {Document review of labs and clinical decision tools ie heart score, Chads2Vasc2 etc:1}  {Document your independent review of radiology images, and any outside records:1} {Document your discussion with family members, caretakers, and with consultants:1} {Document social determinants of health affecting pt's care:1} {Document your decision making why or why not admission, treatments were needed:1} Final Clinical Impression(s) / ED Diagnoses Final diagnoses:  None    Rx / DC Orders ED Discharge Orders     None

## 2022-12-28 NOTE — ED Triage Notes (Signed)
BIBA for back pain. Pt had PT today, began having lower back pain afterward. 128/76 BP 60 HR 100% room air 114 cbg 16 rr

## 2023-01-10 ENCOUNTER — Other Ambulatory Visit: Payer: Self-pay

## 2023-01-10 ENCOUNTER — Emergency Department (HOSPITAL_COMMUNITY)
Admission: EM | Admit: 2023-01-10 | Discharge: 2023-01-11 | Disposition: A | Discharge status: Home / Self Care | Payer: 59 | Attending: Emergency Medicine | Admitting: Emergency Medicine

## 2023-01-10 DIAGNOSIS — Z7982 Long term (current) use of aspirin: Secondary | ICD-10-CM | POA: Insufficient documentation

## 2023-01-10 DIAGNOSIS — K449 Diaphragmatic hernia without obstruction or gangrene: Secondary | ICD-10-CM | POA: Insufficient documentation

## 2023-01-10 DIAGNOSIS — K551 Chronic vascular disorders of intestine: Secondary | ICD-10-CM | POA: Diagnosis not present

## 2023-01-10 DIAGNOSIS — I700 Atherosclerosis of aorta: Secondary | ICD-10-CM | POA: Diagnosis not present

## 2023-01-10 DIAGNOSIS — M549 Dorsalgia, unspecified: Secondary | ICD-10-CM | POA: Diagnosis present

## 2023-01-10 DIAGNOSIS — M545 Low back pain, unspecified: Secondary | ICD-10-CM

## 2023-01-10 DIAGNOSIS — R079 Chest pain, unspecified: Secondary | ICD-10-CM

## 2023-01-10 NOTE — ED Triage Notes (Signed)
Pt from home via EMS who had a sudden central chest and back pain lasting about 15 minutes. That was 2.5 hours ago. All symptoms resolved. Hx chronic back pain,siatica, htn, deg disks, previous cva. No sob. No n/v.

## 2023-01-11 ENCOUNTER — Emergency Department (HOSPITAL_COMMUNITY): Payer: 59

## 2023-01-11 LAB — COMPREHENSIVE METABOLIC PANEL
ALT: 17 U/L (ref 0–44)
AST: 17 U/L (ref 15–41)
Albumin: 3.2 g/dL — ABNORMAL LOW (ref 3.5–5.0)
Alkaline Phosphatase: 60 U/L (ref 38–126)
Anion gap: 8 (ref 5–15)
BUN: 25 mg/dL — ABNORMAL HIGH (ref 8–23)
CO2: 23 mmol/L (ref 22–32)
Calcium: 8.6 mg/dL — ABNORMAL LOW (ref 8.9–10.3)
Chloride: 105 mmol/L (ref 98–111)
Creatinine, Ser: 1.15 mg/dL (ref 0.61–1.24)
GFR, Estimated: >60 mL/min (ref >60)
Glucose, Bld: 122 mg/dL — ABNORMAL HIGH (ref 70–99)
Potassium: 4.3 mmol/L (ref 3.5–5.1)
Sodium: 136 mmol/L (ref 135–145)
Total Bilirubin: 0.7 mg/dL (ref 0.3–1.2)
Total Protein: 6.7 g/dL (ref 6.5–8.1)

## 2023-01-11 LAB — CBC WITH DIFFERENTIAL/PLATELET
Abs Immature Granulocytes: 0.04 10*3/uL (ref 0.00–0.07)
Basophils Absolute: 0 10*3/uL (ref 0.0–0.1)
Basophils Relative: 0 %
Eosinophils Absolute: 0.1 10*3/uL (ref 0.0–0.5)
Eosinophils Relative: 1 %
HCT: 36.1 % — ABNORMAL LOW (ref 39.0–52.0)
Hemoglobin: 11.6 g/dL — ABNORMAL LOW (ref 13.0–17.0)
Immature Granulocytes: 1 %
Lymphocytes Relative: 22 %
Lymphs Abs: 1.9 10*3/uL (ref 0.7–4.0)
MCH: 28 pg (ref 26.0–34.0)
MCHC: 32.1 g/dL (ref 30.0–36.0)
MCV: 87 fL (ref 80.0–100.0)
Monocytes Absolute: 0.5 10*3/uL (ref 0.1–1.0)
Monocytes Relative: 5 %
Neutro Abs: 6.1 10*3/uL (ref 1.7–7.7)
Neutrophils Relative %: 71 %
Platelets: 206 10*3/uL (ref 150–400)
RBC: 4.15 MIL/uL — ABNORMAL LOW (ref 4.22–5.81)
RDW: 14.9 % (ref 11.5–15.5)
WBC: 8.6 10*3/uL (ref 4.0–10.5)
nRBC: 0 % (ref 0.0–0.2)

## 2023-01-11 LAB — TROPONIN I (HIGH SENSITIVITY): Troponin I (High Sensitivity): 9 ng/L (ref <18)

## 2023-01-11 MED ORDER — PANTOPRAZOLE SODIUM 40 MG PO TBEC: 40.0000 mg | DELAYED_RELEASE_TABLET | Freq: Every day | ORAL | Status: DC

## 2023-01-11 MED ORDER — PANTOPRAZOLE SODIUM 40 MG PO TBEC
40.0000 mg | DELAYED_RELEASE_TABLET | Freq: Every day | ORAL | Status: DC
  Administered 2023-01-11: 40 mg via ORAL
  Filled 2023-01-11: qty 1

## 2023-01-11 MED ORDER — PANTOPRAZOLE SODIUM 40 MG PO TBEC: 40.0000 mg | DELAYED_RELEASE_TABLET | Freq: Every day | ORAL | 0 refills | Status: DC

## 2023-01-11 MED ORDER — IOHEXOL 350 MG/ML SOLN
100.0000 mL | Freq: Once | INTRAVENOUS | Status: AC | PRN
  Administered 2023-01-11: 100 mL via INTRAVENOUS
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
## 2023-01-11 NOTE — Discharge Instructions (Addendum)
I have copied and pasted findings from your workup here below. I think it is likely the hiatal hernia that caused your symptoms earlier tonight. You should take protonix (pantoprazole) for the next couple weeks and follow up with GI for further management and consideration of EGD.  1. Aortic atherosclerosis without aneurysm or dissection.  2. Moderate hiatal hernia with thickening of the walls of the distal  esophagus. Endoscopy is suggested for further evaluation on  follow-up.  3. Atherosclerotic calcification and mural thrombus involving the  proximal SMA resulting in moderate stenosis.  4. Cardiomegaly with coronary artery calcifications.

## 2023-01-11 NOTE — ED Notes (Signed)
[facility] called  

## 2023-01-11 NOTE — ED Notes (Signed)
Able to speak with daughter and advised her that father was headed home by [facility].

## 2023-01-15 ENCOUNTER — Ambulatory Visit (INDEPENDENT_AMBULATORY_CARE_PROVIDER_SITE_OTHER): Payer: 59 | Admitting: Orthopaedic Surgery

## 2023-01-15 DIAGNOSIS — M48062 Spinal stenosis, lumbar region with neurogenic claudication: Secondary | ICD-10-CM

## 2023-01-15 NOTE — Progress Notes (Signed)
Chief Complaint: Left leg pain and weakness     History of Present Illness:    Luis Poole is a 82 y.o. male presents today with a chronic left foot drop as well as left lower extremity weakness in the setting of previous L5-S1 fusion with Dr. Ophelia Charter.  Since that time he has been come increasingly wheelchair-bound and is fully dependent on a walker for ambulation.  He has weakness with foot drop in the left leg for which she requires an AFO.  He is still having persistent back pain that does radiate down the left leg with weakness.  He is here today stating that there is any additional intervention to be performed as he is quite limited    Surgical History:    Status post L5-S1 fusion distantly with Dr. Kevan Ny  PMH/PSH/Family History/Social History/Meds/Allergies:    Past Medical History:  Diagnosis Date   Anemia, unspecified 06/08/2013   Cataract    CHF (congestive heart failure) (HCC)    Chronic kidney disease    Diabetes mellitus    Foot drop, left foot    AFO   Hypertension    PAD (peripheral artery disease) (HCC)    S/P lumbar fusion    Sequelae of cerebral infarction    Stroke [facility])    Past Surgical History:  Procedure Laterality Date   CATARACT EXTRACTION     EYE SURGERY     KNEE SURGERY  2006   NECK SURGERY  2012   SPINE SURGERY  2009   TONSILECTOMY, ADENOIDECTOMY, BILATERAL MYRINGOTOMY AND TUBES     Social History   Socioeconomic History   Marital status: Single    Spouse name: Not on file   Number of children: 3   Years of education: Not on file   Highest education level: 12th grade  Occupational History    Comment: housekeeping  Tobacco Use   Smoking status: Former   Smokeless tobacco: Former  Building services engineer Use: Never used  Substance and Sexual Activity   Alcohol use: No   Drug use: No   Sexual activity: Not on file  Other Topics Concern   Not on file  Social History Narrative   Lives with his  children   Left handed   Caffeine: occas 1 soda mixed w water.    Social Determinants of Health   Financial Resource Strain: Not on file  Food Insecurity: No Food Insecurity (11/26/2022)   Hunger Vital Sign    Worried About Running Out of Food in the Last Year: Never true    Ran Out of Food in the Last Year: Never true  Transportation Needs: No Transportation Needs (11/26/2022)   PRAPARE - Administrator, Civil Service (Medical): No    Lack of Transportation (Non-Medical): No  Physical Activity: Not on file  Stress: Not on file  Social Connections: Not on file   Family History  Problem Relation Age of Onset   Stroke Mother    Allergies  Allergen Reactions   Ibuprofen Itching   Lisinopril Anaphylaxis   Pregabalin Swelling   Gabapentin Other (See Comments)    Dizziness   Current Outpatient Medications  Medication Sig Dispense Refill   allopurinol (ZYLOPRIM) 100 MG tablet Take 100 mg by mouth daily.     amLODipine (NORVASC)  10 MG tablet Take 10 mg by mouth daily.      aspirin EC 81 MG tablet Take 81 mg by mouth daily.     atorvastatin (LIPITOR) 20 MG tablet Take 1 tablet (20 mg total) by mouth daily. 30 tablet 0   colchicine 0.6 MG tablet Take 1 tablet (0.6 mg total) daily by mouth. (Patient taking differently: Take 0.6 mg by mouth daily as needed (gout pain).) 30 tablet 0   cyclobenzaprine (FLEXERIL) 10 MG tablet Take 1 tablet (10 mg total) by mouth 2 (two) times daily as needed for muscle spasms. 20 tablet 0   ferrous sulfate 325 (65 FE) MG tablet Take 325 mg by mouth daily with breakfast.     Omega-3 Fatty Acids (FISH OIL) 1000 MG CAPS Take 1,000 mg by mouth daily.     oxyCODONE-acetaminophen (PERCOCET/ROXICET) 5-325 MG tablet Take 1 tablet by mouth every 6 (six) hours as needed for severe pain. 10 tablet 0   pantoprazole (PROTONIX) 40 MG tablet Take 1 tablet (40 mg total) by mouth daily for 14 days. 14 tablet 0   polyethylene glycol (MIRALAX / GLYCOLAX) 17 g packet  Take 17 g by mouth daily as needed for mild constipation. 14 each 0   predniSONE (STERAPRED UNI-PAK 21 TAB) 10 MG (21) TBPK tablet Take by mouth daily. Take 6 tabs by mouth daily  for 2 days, then 5 tabs for 2 days, then 4 tabs for 2 days, then 3 tabs for 2 days, 2 tabs for 2 days, then 1 tab by mouth daily for 2 days 42 tablet 0   pregabalin (LYRICA) 100 MG capsule Take 100 mg by mouth 2 (two) times daily.     valsartan (DIOVAN) 160 MG tablet Take 160 mg daily by mouth.     No current facility-administered medications for this visit.   No results found.  Review of Systems:   A ROS was performed including pertinent positives and negatives as documented in the HPI.  Physical Exam :   Constitutional: NAD and appears stated age Neurological: Alert and oriented Psych: Appropriate affect and cooperative There were no vitals taken for this visit.   Comprehensive Musculoskeletal Exam:    He has an AFO in place to assist with his foot drop.  There is weakness with extension and flexion of the left knee without any tibialis anterior EHL strength.  Imaging:   Xray (4 views lumbar spine, CT scan lumbar spine): Previous L5-S1 fusion with multiple degenerative levels cephalad to this    I personally reviewed and interpreted the radiographs.   Assessment:   82 y.o. male with quite significant lumbar degenerative disease in the setting of previous L5-S1 fusion.  I did describe that his foot drop and many of his chronic findings are likely the result of previous stroke after his lumbar procedure with Dr. Ophelia Charter.  That being said he is still having persistent pain that radiates down the leg.  As result he would like to see if there is anything that can be done for his lumbar spine.  We will plan to perform an MRI of his lumbar spine with metal subtraction to see if he would be a candidate for any type of revision we will plan to see him follow-up with Dr. Christell Constant  Plan :    -Plan for MRI lumbar spine  and follow-up with Dr. Christell Constant for further discussion     I personally saw and evaluated the patient, and participated in the management and treatment  plan.  Huel Cote, MD Attending Physician, Orthopedic Surgery  This document was dictated using Dragon voice recognition software. A reasonable attempt at proof reading has been made to minimize errors.

## 2023-02-03 ENCOUNTER — Ambulatory Visit: Payer: 59 | Admitting: Orthopedic Surgery

## 2023-03-10 ENCOUNTER — Ambulatory Visit (HOSPITAL_COMMUNITY)
Admission: RE | Admit: 2023-03-10 | Discharge: 2023-03-10 | Disposition: A | Discharge status: Home / Self Care | Payer: 59 | Source: Ambulatory Visit | Attending: Orthopaedic Surgery | Admitting: Orthopaedic Surgery

## 2023-03-10 ENCOUNTER — Encounter (HOSPITAL_COMMUNITY): Payer: Self-pay

## 2023-03-10 DIAGNOSIS — M48062 Spinal stenosis, lumbar region with neurogenic claudication: Secondary | ICD-10-CM

## 2023-03-13 ENCOUNTER — Telehealth: Payer: Self-pay | Admitting: Orthopedic Surgery

## 2023-03-13 NOTE — Telephone Encounter (Signed)
Pt called in needing help but before he can tell what is going on the line disconnects, please advise if calls back

## 2023-03-13 NOTE — Telephone Encounter (Signed)
Patient had MRI cancelled because of Spine Stim please advise, please advise pt on where new imaging will be being he has to set up transportation will cancel ride for tomorrow by 3pm today if he has not heard from [place] with a new plan

## 2023-03-14 ENCOUNTER — Ambulatory Visit (HOSPITAL_COMMUNITY): Admission: RE | Admit: 2023-03-14 | Payer: 59 | Source: Ambulatory Visit

## 2023-03-14 ENCOUNTER — Emergency Department (HOSPITAL_COMMUNITY)
Admission: EM | Admit: 2023-03-14 | Discharge: 2023-03-14 | Disposition: A | Discharge status: Home / Self Care | Payer: 59 | Attending: Emergency Medicine | Admitting: Emergency Medicine

## 2023-03-14 ENCOUNTER — Other Ambulatory Visit: Payer: Self-pay

## 2023-03-14 DIAGNOSIS — N183 Chronic kidney disease, stage 3 unspecified: Secondary | ICD-10-CM | POA: Diagnosis not present

## 2023-03-14 DIAGNOSIS — Z8673 Personal history of transient ischemic attack (TIA), and cerebral infarction without residual deficits: Secondary | ICD-10-CM | POA: Diagnosis not present

## 2023-03-14 DIAGNOSIS — Z79899 Other long term (current) drug therapy: Secondary | ICD-10-CM | POA: Insufficient documentation

## 2023-03-14 DIAGNOSIS — I5032 Chronic diastolic (congestive) heart failure: Secondary | ICD-10-CM | POA: Diagnosis not present

## 2023-03-14 DIAGNOSIS — I130 Hypertensive heart and chronic kidney disease with heart failure and stage 1 through stage 4 chronic kidney disease, or unspecified chronic kidney disease: Secondary | ICD-10-CM | POA: Insufficient documentation

## 2023-03-14 DIAGNOSIS — E1122 Type 2 diabetes mellitus with diabetic chronic kidney disease: Secondary | ICD-10-CM | POA: Diagnosis not present

## 2023-03-14 DIAGNOSIS — Z7982 Long term (current) use of aspirin: Secondary | ICD-10-CM | POA: Insufficient documentation

## 2023-03-14 DIAGNOSIS — R112 Nausea with vomiting, unspecified: Secondary | ICD-10-CM | POA: Insufficient documentation

## 2023-03-14 DIAGNOSIS — R0989 Other specified symptoms and signs involving the circulatory and respiratory systems: Secondary | ICD-10-CM

## 2023-03-14 DIAGNOSIS — R1110 Vomiting, unspecified: Secondary | ICD-10-CM

## 2023-03-14 LAB — COMPREHENSIVE METABOLIC PANEL
ALT: 14 U/L (ref 0–44)
AST: 22 U/L (ref 15–41)
Albumin: 4.2 g/dL (ref 3.5–5.0)
Alkaline Phosphatase: 73 U/L (ref 38–126)
Anion gap: 12 (ref 5–15)
BUN: 22 mg/dL (ref 8–23)
CO2: 22 mmol/L (ref 22–32)
Calcium: 9.1 mg/dL (ref 8.9–10.3)
Chloride: 102 mmol/L (ref 98–111)
Creatinine, Ser: 1.2 mg/dL (ref 0.61–1.24)
GFR, Estimated: >60 mL/min (ref >60)
Glucose, Bld: 115 mg/dL — ABNORMAL HIGH (ref 70–99)
Potassium: 3.6 mmol/L (ref 3.5–5.1)
Sodium: 136 mmol/L (ref 135–145)
Total Bilirubin: 0.6 mg/dL (ref 0.3–1.2)
Total Protein: 8.6 g/dL — ABNORMAL HIGH (ref 6.5–8.1)

## 2023-03-14 LAB — CBC
HCT: 39.2 % (ref 39.0–52.0)
Hemoglobin: 12.5 g/dL — ABNORMAL LOW (ref 13.0–17.0)
MCH: 28.2 pg (ref 26.0–34.0)
MCHC: 31.9 g/dL (ref 30.0–36.0)
MCV: 88.5 fL (ref 80.0–100.0)
Platelets: 204 10*3/uL (ref 150–400)
RBC: 4.43 MIL/uL (ref 4.22–5.81)
RDW: 13.7 % (ref 11.5–15.5)
WBC: 5.3 10*3/uL (ref 4.0–10.5)
nRBC: 0 % (ref 0.0–0.2)

## 2023-03-14 LAB — LIPASE, BLOOD: Lipase: 31 U/L (ref 11–51)

## 2023-03-14 LAB — TROPONIN I (HIGH SENSITIVITY): Troponin I (High Sensitivity): 13 ng/L (ref <18)

## 2023-03-14 NOTE — ED Provider Notes (Signed)
[place] EMERGENCY DEPARTMENT AT [facility] Provider Note  CSN: [id number] Arrival date & time: 03/14/23 0204  Chief Complaint(s) Nausea and Hypertension  HPI Luis Poole is a 82 y.o. male who with a past medical history listed below who presents to the emergency department after a bout of emesis and noted to be hypertensive.  He reports that it is while drinking water, and the water went down the wrong pipe and choke.  This caused him to have a bout of emesis.  He states that his family got concerned and checked his blood pressure and wanted him to get checked in the emergency department.  Patient denied any associated chest pain or shortness of breath.  Denies any headache.  No visual disturbance.  No abdominal pain.   Hypertension    Past Medical History Past Medical History:  Diagnosis Date   Anemia, unspecified 06/08/2013   Cataract    CHF (congestive heart failure) (HCC)    Chronic kidney disease    Diabetes mellitus    Foot drop, left foot    AFO   Hypertension    PAD (peripheral artery disease) (HCC)    S/P lumbar fusion    Sequelae of cerebral infarction    Stroke [facility])    Patient Active Problem List   Diagnosis Date Noted   Near syncope 11/26/2022   Syncope 10/06/2017   Chest pain 10/06/2017   Low back pain 06/28/2017   Leg swelling 08/23/2013   Left leg swelling 08/23/2013   Neuropathy 08/23/2013   Acute gout 08/23/2013   D-dimer, elevated 08/23/2013   CKD (chronic kidney disease), stage III (HCC) 08/23/2013   Chronic diastolic heart failure (HCC) 08/23/2013   Anemia 06/08/2013   Hypertension 08/29/2012   Diabetes mellitus type 2 in nonobese (HCC) 08/29/2012   History of CVA (cerebrovascular accident) 08/28/2012    Class: Acute   L-S radiculopathy 08/28/2012   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 Authorizing Provider  allopurinol (ZYLOPRIM) 100 MG tablet Take 100 mg by mouth daily.   Yes [provider]  amLODipine (NORVASC) 10 MG tablet Take 10 mg by mouth daily.    Yes [provider]  aspirin EC 81 MG tablet Take 81 mg by mouth daily.   Yes [provider]  atorvastatin (LIPITOR) 20 MG tablet Take 1 tablet (20 mg total) by mouth daily. 10/09/17  Yes Burnadette Pop, MD  colchicine 0.6 MG tablet Take 1 tablet (0.6 mg total) daily by mouth. Patient taking differently: Take 0.6 mg by mouth daily as needed (gout pain). 06/29/17  Yes Maxie Barb, MD  cyclobenzaprine (FLEXERIL) 10 MG tablet Take 1 tablet (10 mg total) by mouth 2 (two) times daily as needed for muscle spasms. 12/28/22  Yes Jeanelle Malling, PA  ferrous sulfate 325 (65 FE) MG tablet Take 325 mg by mouth daily with breakfast.   Yes [provider]  furosemide (LASIX) 20 MG tablet Take 20 mg by mouth 2 (two) times daily. 02/27/23  Yes [provider]  pantoprazole (PROTONIX) 40 MG tablet Take 1 tablet (40 mg total) by mouth daily for 14 days. 01/11/23 03/14/23 Yes Mesner, Barbara Cower, MD  polyethylene glycol (MIRALAX / GLYCOLAX) 17 g packet Take 17 g by mouth daily as needed for  mild constipation. 11/29/22  Yes Calvert Cantor, MD  pregabalin (LYRICA) 100 MG capsule Take 100 mg by mouth 2 (two) times daily.   Yes [provider]  valsartan (DIOVAN) 160 MG tablet Take 160 mg daily by mouth.   Yes [provider]                                                                                                                                    Allergies Ibuprofen, Lisinopril, Pregabalin, and Gabapentin  Review of Systems Review of Systems As noted in HPI  Physical Exam Vital Signs  I have reviewed the triage vital signs BP (!) 161/103   Pulse 66    Temp 98.9 F (37.2 C) (Oral)   Resp 18   Ht 5\' 7"  (1.702 m)   Wt 90.3 kg   SpO2 100%   BMI 31.17 kg/m   Physical Exam Vitals reviewed.  Constitutional:      General: He is not in acute distress.    Appearance: He is well-developed. He is not diaphoretic.  HENT:     Head: Normocephalic and atraumatic.     Nose: Nose normal.  Eyes:     General: No scleral icterus.       Right eye: No discharge.        Left eye: No discharge.     Conjunctiva/sclera: Conjunctivae normal.     Pupils: Pupils are equal, round, and reactive to light.  Cardiovascular:     Rate and Rhythm: Normal rate and regular rhythm.     Heart sounds: No murmur heard.    No friction rub. No gallop.  Pulmonary:     Effort: Pulmonary effort is normal. No respiratory distress.     Breath sounds: Normal breath sounds. No stridor. No rales.  Abdominal:     General: There is no distension.     Palpations: Abdomen is soft.     Tenderness: There is no abdominal tenderness.  Musculoskeletal:        General: No tenderness.     Cervical back: Normal range of motion and neck supple.  Skin:    General: Skin is warm and dry.     Findings: No erythema or rash.  Neurological:     Mental Status: He is alert and oriented to person, place, and time.     ED Results and Treatments Labs (all labs ordered are listed, but only abnormal results are displayed) Labs Reviewed  COMPREHENSIVE METABOLIC PANEL - Abnormal; Notable for the following components:      Result Value   Glucose, Bld 115 (*)    Total Protein 8.6 (*)    All other components within normal limits  CBC - Abnormal; Notable for the following components:   Hemoglobin 12.5 (*)    All other components within normal limits  LIPASE, BLOOD  TROPONIN I (HIGH SENSITIVITY)  EKG  EKG Interpretation Date/Time:  Friday March 14 2023 02:17:28  EDT Ventricular Rate:  75 PR Interval:  226 QRS Duration:  157 QT Interval:  415 QTC Calculation: 464 R Axis:   41  Text Interpretation: Sinus rhythm Prolonged PR interval Right bundle branch block Baseline wander in lead(s) V2 Confirmed by Drema Pry [phone]) on 03/14/2023 2:29:15 AM       Radiology No results found.  Medications Ordered in ED Medications - No data to display Procedures Procedures  (including critical care time) Medical Decision Making / ED Course   Medical Decision Making Amount and/or Complexity of Data Reviewed Labs: ordered.    Patient presents for choking episode resulting in emesis. Well-appearing, benign abdomen.  Given comorbidities, will obtain screening labs. EKG without acute ischemic changes, dysrhythmias or blocks.  Initial troponin ordered in triage negative.  Low suspicion for ACS requiring further evaluation. CBC without leukocytosis or anemia.  Metabolic panel without significant electrolyte derangements or renal insufficiency.  No evidence of bili obstruction or pancreatitis.  Patient monitored for several hours without recurrence of symptoms.  Tolerated p.o. challenge.    Final Clinical Impression(s) / ED Diagnoses Final diagnoses:  Choking episode  Vomiting in adult   The patient appears reasonably screened and/or stabilized for discharge and I doubt any other medical condition or other [facility] requiring further screening, evaluation, or treatment in the ED at this time. I have discussed the findings, Dx and Tx plan with the patient/family who expressed understanding and agree(s) with the plan. Discharge instructions discussed at length. The patient/family was given strict return precautions who verbalized understanding of the instructions. No further questions at time of discharge.  Disposition: Discharge  Condition: Good  ED Discharge Orders     None        Follow Up: Tracey Harries, MD [address] [place] [address] [phone]  Call  as needed    This chart was dictated using voice recognition software.  Despite best efforts to proofread,  errors can occur which can change the documentation meaning.    Nira Conn, MD 03/14/23 [phone]

## 2023-03-14 NOTE — ED Triage Notes (Signed)
Pt presents via EMS c/o nausea and emesis x1 PTA. PT denies pain at this time. Reports took BP medication however threw it up. EMS reports pt hypertensive per their report. Pt  A&O x4.

## 2023-03-18 ENCOUNTER — Other Ambulatory Visit: Payer: Self-pay | Admitting: Radiology

## 2023-03-18 DIAGNOSIS — M48062 Spinal stenosis, lumbar region with neurogenic claudication: Secondary | ICD-10-CM

## 2023-03-19 ENCOUNTER — Ambulatory Visit: Payer: 59 | Admitting: Orthopedic Surgery

## 2023-04-01 ENCOUNTER — Telehealth: Payer: Self-pay | Admitting: *Deleted

## 2023-04-01 NOTE — Telephone Encounter (Signed)
-----   Message from London Sheer sent at 03/17/2023  1:17 PM EDT ----- If that is the case, can you put in for a CT myelogram then? Thanks ----- Message ----- From: Penne Lash, Neysa Bonito, RT Sent: 03/17/2023   1:14 PM EDT To: Samuella Cota, CMA; London Sheer, MD   ----- Message ----- From: Caffie Pinto Sent: 03/17/2023  11:05 AM EDT To: Rolanda Lundborg, RT  Doreene Eland,   Happy Monday! We saw that he is full body eligible, however; per our Neuro Radiologist the stimulator is not in the conditional area where it should be implanted according the the conditional guidelines. He said the patient could not be safely scanned.  Thank you!  Kendal Hymen ----- Message ----- From: Penne Lash, Neysa Bonito, RT Sent: 03/13/2023   1:19 PM EDT To: Weston Settle Justice  Good morning! I checked on Mr. Macak and yes, he has our 2023 Alpha system so he is able to have a full body MRI. I would just need to speak to him on the phone to be able to do a lead check, and to teach him how to put his remote in MRI mode.  Let me know what else I can do to help,  Enrique Sack     This is from the [employer].  Please call her if you have further questions regarding his spinal Cord Stimulator @ [phone]    Thank you.

## 2023-04-04 ENCOUNTER — Encounter: Payer: Self-pay | Admitting: Orthopaedic Surgery

## 2023-04-07 ENCOUNTER — Ambulatory Visit
Admission: RE | Admit: 2023-04-07 | Discharge: 2023-04-07 | Disposition: A | Discharge status: Home / Self Care | Payer: 59 | Source: Ambulatory Visit | Attending: Orthopedic Surgery | Admitting: Orthopedic Surgery

## 2023-04-07 DIAGNOSIS — M48062 Spinal stenosis, lumbar region with neurogenic claudication: Secondary | ICD-10-CM

## 2023-04-07 MED ORDER — ONDANSETRON HCL 4 MG/2ML IJ SOLN: 4.0000 mg | Freq: Once | INTRAMUSCULAR | Status: DC | PRN

## 2023-04-07 MED ORDER — IOPAMIDOL (ISOVUE-M 200) INJECTION 41%
15.0000 mL | Freq: Once | INTRAMUSCULAR | Status: AC
  Administered 2023-04-07: 15 mL via INTRATHECAL

## 2023-04-07 MED ORDER — MEPERIDINE HCL 50 MG/ML IJ SOLN: 50.0000 mg | Freq: Once | INTRAMUSCULAR | Status: DC | PRN

## 2023-04-07 MED ORDER — DIAZEPAM 5 MG PO TABS: 5.0000 mg | ORAL_TABLET | Freq: Once | ORAL | Status: DC

## 2023-04-07 NOTE — Progress Notes (Signed)
Pt has spinal cord stimulator and reports he has turned it off for her CT myelogram procedure.  

## 2023-04-07 NOTE — Discharge Instructions (Signed)
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
## 2023-04-10 ENCOUNTER — Encounter: Payer: Self-pay | Admitting: Physician Assistant

## 2023-04-10 ENCOUNTER — Ambulatory Visit: Payer: 59 | Attending: Physician Assistant | Admitting: Physician Assistant

## 2023-04-10 VITALS — BP 148/70 | HR 83 | Ht 67.0 in | Wt 203.0 lb

## 2023-04-10 DIAGNOSIS — I10 Essential (primary) hypertension: Secondary | ICD-10-CM

## 2023-04-10 DIAGNOSIS — R55 Syncope and collapse: Secondary | ICD-10-CM | POA: Diagnosis not present

## 2023-04-10 DIAGNOSIS — I2510 Atherosclerotic heart disease of native coronary artery without angina pectoris: Secondary | ICD-10-CM

## 2023-04-10 DIAGNOSIS — E785 Hyperlipidemia, unspecified: Secondary | ICD-10-CM | POA: Diagnosis not present

## 2023-04-10 DIAGNOSIS — E118 Type 2 diabetes mellitus with unspecified complications: Secondary | ICD-10-CM

## 2023-04-10 MED ORDER — VALSARTAN 160 MG PO TABS: 160.0000 mg | ORAL_TABLET | Freq: Every day | ORAL | 3 refills | Status: DC

## 2023-04-10 NOTE — Patient Instructions (Signed)
Medication Instructions:  Your physician recommends that you continue on your current medications as directed. Please refer to the Current Medication list given to you today.  *If you need a refill on your cardiac medications before your next appointment, please call your pharmacy*   Lab Work: None ordered  If you have labs (blood work) drawn today and your tests are completely normal, you will receive your results only by: MyChart Message (if you have MyChart) OR A paper copy in the mail If you have any lab test that is abnormal or we need to change your treatment, we will call you to review the results.   Testing/Procedures: None ordered   Follow-Up: At [facility], you and your health needs are our priority.  As part of our continuing mission to provide you with exceptional heart care, we have created designated Provider Care Teams.  These Care Teams include your primary Cardiologist (physician) and Advanced Practice Providers (APPs -  Physician Assistants and Nurse Practitioners) who all work together to provide you with the care you need, when you need it.  We recommend signing up for the patient portal called "MyChart".  Sign up information is provided on this After Visit Summary.  MyChart is used to connect with patients for Virtual Visits (Telemedicine).  Patients are able to view lab/test results, encounter notes, upcoming appointments, etc.  Non-urgent messages can be sent to your provider as well.   To learn more about what you can do with MyChart, go to [email or web address].    Your next appointment:   6 month(s)  Provider:   Charlton Haws, MD  or Jari Favre, PA-C         Other Instructions

## 2023-05-02 ENCOUNTER — Ambulatory Visit (HOSPITAL_BASED_OUTPATIENT_CLINIC_OR_DEPARTMENT_OTHER): Payer: 59 | Admitting: Orthopaedic Surgery

## 2023-05-02 ENCOUNTER — Telehealth (HOSPITAL_BASED_OUTPATIENT_CLINIC_OR_DEPARTMENT_OTHER): Payer: Self-pay | Admitting: Orthopaedic Surgery

## 2023-05-02 NOTE — Telephone Encounter (Signed)
Call the patient and did not get an answer voice mail was full. Also on Wed (9/11) called Theodore Demark to inform her that this patient was sch wrong and needs to be sch with Dr Shon Baton because he has a CY scan and also teams her.

## 2023-06-04 ENCOUNTER — Encounter: Payer: Self-pay | Admitting: Orthopedic Surgery

## 2023-06-04 ENCOUNTER — Ambulatory Visit: Payer: 59 | Admitting: Orthopedic Surgery

## 2023-06-04 ENCOUNTER — Other Ambulatory Visit (INDEPENDENT_AMBULATORY_CARE_PROVIDER_SITE_OTHER): Payer: 59

## 2023-06-04 VITALS — BP 153/81 | HR 66 | Ht 67.0 in | Wt 203.0 lb

## 2023-06-04 DIAGNOSIS — M48062 Spinal stenosis, lumbar region with neurogenic claudication: Secondary | ICD-10-CM | POA: Diagnosis not present

## 2023-06-04 NOTE — Progress Notes (Signed)
Orthopedic Spine Surgery Office Note 2  Assessment: Patient is a 82 y.o. male with chronic low back pain that radiates into the left lower extremity along the lateral aspect of the thigh and leg.  Has had this since his surgery in 2012.  No recent changes.  Has a foot drop that developed sometime after the surgery.   Plan: -He said all of his symptoms developed after his L5/S1 fusion.  He does have stenosis seen on more recent MRIs and his CT myelogram, but his symptoms predate those scans and go back to 2012 according to the history.  There is an MRI from 2014 that does not show any significant stenosis to explain his pain.  Accordingly, I told him that I think something may have happened in surgery that irritated that L5 nerve.  Given the duration of his neurologic deficit and pain, I do not think any further surgical intervention would provide him with any lasting relief.  He has tried multiple conservative treatments over the years including gabapentin, Tylenol, physical therapy, injections that did not provide him with lasting relief so I do not think repeating them would be of much benefit.  He could go to pain management if he wants to go that route.  I would also happily refer him for a second opinion if he wants to see a another surgeon that may have a different opinion. -Patient should follow-up in the office on an as-needed basis   Patient expressed understanding of the plan and all questions were answered to the patient's satisfaction.   ___________________________________________________________________________   History:  Patient is a 82 y.o. male who presents today for lumbar spine.  Patient states that he had a L5/S1 spinal fusion with Dr. Otelia Sergeant in 2012.  He states he had low back pain prior to the surgery but then postoperatively developed left leg pain.  He said he feels the pain rating into the lateral aspect of the left thigh and left leg.  He also developed a foot drop after the  surgery.  He has been using an AFO and a walker as a result of the foot drop.  He has no symptoms in the right lower extremity.  The pain has been present and consistent in nature since onset in 2012.  He said he has tried multiple conservative treatments over the years but has not found any that give him any significant relief.   Weakness: Yes, has a foot drop on the left side.  No other weakness noted Symptoms of imbalance: Yes, has difficulty with balance as a result of the foot drop.  He has had imbalance since onset in 2012.  No recent changes. Bowel or bladder incontinence: Denies Saddle anesthesia: Denies  Treatments tried: PT, Tylenol, ibuprofen, injections, gabapentin, narcotics, oral steroids  Review of systems: Denies fevers and chills, night sweats, unexplained weight loss, history of cancer.  Has had pain that wakes him at night  Past medical history: Chronic anemia Left chronic foot drop HTN PAD CHF DM (last A1C was 6.0 on 01/30/2023) History of stroke  Allergies: ibuprofen, lisinopril, pregablin, gabapentin  Past surgical history:  Cataract surgery L5/S1 PSIF Neck surgery Tonsillectomy Knee surgery  Social history: Denies use of nicotine product (smoking, vaping, patches, smokeless) Alcohol use: Denies Denies recreational drug use   Physical Exam:  BMI of 31.8  General: no acute distress, appears stated age Neurologic: alert, answering questions appropriately, following commands Respiratory: unlabored breathing on room air, symmetric chest rise Psychiatric: appropriate affect, normal  cadence to speech   MSK (spine):  -Strength exam      Left  Right EHL    0/5  5/5 TA    0/5  5/5 GSC    0/5  5/5 Knee extension  5/5  5/5 Hip flexion   5/5  5/5  -Sensory exam    Sensation intact to light touch in L3-S1 nerve distributions of bilateral lower extremities  -Achilles DTR: 0/4 on the left, 1/4 on the right -Patellar tendon DTR: 1/4 on the left, 1/4  on the right  -Straight leg raise: Negative bilaterally -Femoral nerve stretch test: Negative bilaterally -Clonus: no beats bilaterally  -Left hip exam: No pain through range of motion, negative Stinchfield, negative FABER -Right hip exam: No pain through range of motion, negative Stinchfield, negative FABER  Imaging: XRs of the lumbar spine from 06/04/2023 was independently reviewed and interpreted, showing 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 Disc height loss at L2/3 and L3/4.  Anterior osteophyte formation seen at L2/3.  Spinal cord stimulator noted posterior to L1 and L2 on the left side.  No fracture or dislocation seen.  No evidence of instability on flexion/extension views.  CT myelogram of the lumbar spine from 04/07/2023 was independently reviewed and interpreted, showing posterior instrument spinal fusion at L5/S1.  Screws appear in appropriate position.  There appears to be fusion mass across the interspace.  Vacuum disc phenomenon at L2/3 and L3/4.  Anterior osteophyte formation noted at L2/3.  Bilateral foraminal stenosis seen at L2/3 and L3/4.  No other significant stenosis seen.  MRI of the lumbar spine from 02/08/2021 was independently reviewed and interpreted, showing bilateral foraminal stenosis at L2/3, L3/4, L4/5.  No other significant stenosis seen.   Patient name: Luis Poole Patient MRN: [id number] Date of visit: 06/04/23

## 2023-08-02 ENCOUNTER — Other Ambulatory Visit: Payer: Self-pay

## 2023-08-02 ENCOUNTER — Emergency Department (HOSPITAL_COMMUNITY): Payer: 59

## 2023-08-02 ENCOUNTER — Emergency Department (HOSPITAL_COMMUNITY)
Admission: EM | Admit: 2023-08-02 | Discharge: 2023-08-02 | Disposition: A | Discharge status: Home / Self Care | Payer: 59

## 2023-08-02 DIAGNOSIS — R55 Syncope and collapse: Secondary | ICD-10-CM | POA: Diagnosis present

## 2023-08-02 DIAGNOSIS — R42 Dizziness and giddiness: Secondary | ICD-10-CM | POA: Insufficient documentation

## 2023-08-02 DIAGNOSIS — Z79899 Other long term (current) drug therapy: Secondary | ICD-10-CM | POA: Diagnosis not present

## 2023-08-02 DIAGNOSIS — I10 Essential (primary) hypertension: Secondary | ICD-10-CM | POA: Diagnosis not present

## 2023-08-02 DIAGNOSIS — Z7982 Long term (current) use of aspirin: Secondary | ICD-10-CM | POA: Diagnosis not present

## 2023-08-02 LAB — CBC
HCT: 40.2 % (ref 39.0–52.0)
Hemoglobin: 12.8 g/dL — ABNORMAL LOW (ref 13.0–17.0)
MCH: 27.6 pg (ref 26.0–34.0)
MCHC: 31.8 g/dL (ref 30.0–36.0)
MCV: 86.6 fL (ref 80.0–100.0)
Platelets: 229 10*3/uL (ref 150–400)
RBC: 4.64 MIL/uL (ref 4.22–5.81)
RDW: 14.5 % (ref 11.5–15.5)
WBC: 4.3 10*3/uL (ref 4.0–10.5)
nRBC: 0 % (ref 0.0–0.2)

## 2023-08-02 LAB — BASIC METABOLIC PANEL
Anion gap: 8 (ref 5–15)
BUN: 25 mg/dL — ABNORMAL HIGH (ref 8–23)
CO2: 25 mmol/L (ref 22–32)
Calcium: 9.5 mg/dL (ref 8.9–10.3)
Chloride: 103 mmol/L (ref 98–111)
Creatinine, Ser: 1.25 mg/dL — ABNORMAL HIGH (ref 0.61–1.24)
GFR, Estimated: 58 mL/min — ABNORMAL LOW (ref >60)
Glucose, Bld: 120 mg/dL — ABNORMAL HIGH (ref 70–99)
Potassium: 3.3 mmol/L — ABNORMAL LOW (ref 3.5–5.1)
Sodium: 136 mmol/L (ref 135–145)

## 2023-08-02 LAB — TROPONIN I (HIGH SENSITIVITY): Troponin I (High Sensitivity): 8 ng/L (ref <18)

## 2023-08-02 MED ORDER — MECLIZINE HCL 12.5 MG PO TABS: 12.5000 mg | ORAL_TABLET | Freq: Three times a day (TID) | ORAL | 0 refills | Status: DC | PRN

## 2023-08-02 MED ORDER — SODIUM CHLORIDE 0.9 % IV BOLUS
500.0000 mL | Freq: Once | INTRAVENOUS | Status: AC
  Administered 2023-08-02: 500 mL via INTRAVENOUS

## 2023-08-02 MED ORDER — HYDROCODONE-ACETAMINOPHEN 5-325 MG PO TABS
1.0000 | ORAL_TABLET | Freq: Once | ORAL | Status: AC
  Administered 2023-08-02: 1 via ORAL
  Filled 2023-08-02: qty 1

## 2023-08-02 MED ORDER — MECLIZINE HCL 25 MG PO TABS
12.5000 mg | ORAL_TABLET | Freq: Once | ORAL | Status: AC
  Administered 2023-08-02: 12.5 mg via ORAL
  Filled 2023-08-02: qty 1

## 2023-08-02 MED ORDER — ACETAMINOPHEN 325 MG PO TABS
650.0000 mg | ORAL_TABLET | Freq: Once | ORAL | Status: AC
  Administered 2023-08-02: 650 mg via ORAL
  Filled 2023-08-02: qty 2

## 2023-08-02 NOTE — Discharge Instructions (Addendum)
Your workup today was reassuring.  Try taking your medications staggered rather than taking them all at the same time to see if this helps with your symptoms.  Please follow-up with your doctor and return to the ER for worsening symptoms.

## 2023-08-02 NOTE — ED Provider Notes (Signed)
[place] EMERGENCY DEPARTMENT AT [facility] Provider Note   CSN: [id number] Arrival date & time: 08/02/23  4098     History  Chief Complaint  Patient presents with   Loss of Consciousness    Luis Poole is a 82 y.o. male.  82 year old male with past medical history of hypertension and hyperlipidemia presenting to the emergency department today with dizziness and near syncope.  The patient states that this started this morning while he was talking to his son.  He reports that he was feeling very lightheaded at the time.  He states that this lasted for 67 minutes.  He denies any chest pain or palpitations.  The patient is a relatively poor historian.  His unclear if this is more due to lightheadedness or disequilibrium.  He is denying any room spinning dizziness but states that he felt "off with walking.  He does have some left-sided deficits from previous stroke.  He states that his strength is at his baseline.  He states that he did fall out of his wheelchair a few days ago and is having some lumbar spinal pain since then.  He does not think that he hit his head during the fall but cannot say for so for sure.  He denies any bowel or bladder dysfunction or saddle anesthesia.   Loss of Consciousness Associated symptoms: dizziness        Home Medications Prior to Admission medications   Medication Sig Start Date End Date Taking? Authorizing Provider  meclizine (ANTIVERT) 12.5 MG tablet Take 1 tablet (12.5 mg total) by mouth 3 (three) times daily as needed for dizziness. 08/02/23  Yes Durwin Glaze, MD  allopurinol (ZYLOPRIM) 100 MG tablet Take 100 mg by mouth daily.    [provider]  amLODipine (NORVASC) 10 MG tablet Take 10 mg by mouth daily.     [provider]  aspirin EC 81 MG tablet Take 81 mg by mouth daily.    [provider]  atorvastatin (LIPITOR) 20 MG tablet Take 1 tablet (20 mg total) by mouth daily. 10/09/17   Burnadette Pop, MD   colchicine 0.6 MG tablet Take 1 tablet (0.6 mg total) daily by mouth. Patient taking differently: Take 0.6 mg by mouth daily as needed (gout pain). 06/29/17   Maxie Barb, MD  cyclobenzaprine (FLEXERIL) 10 MG tablet Take 1 tablet (10 mg total) by mouth 2 (two) times daily as needed for muscle spasms. 12/28/22   Jeanelle Malling, PA  ferrous sulfate 325 (65 FE) MG tablet Take 325 mg by mouth daily with breakfast.    [provider]  furosemide (LASIX) 20 MG tablet Take 20 mg by mouth 2 (two) times daily. 02/27/23   [provider]  polyethylene glycol (MIRALAX / GLYCOLAX) 17 g packet Take 17 g by mouth daily as needed for mild constipation. 11/29/22   Calvert Cantor, MD  valsartan (DIOVAN) 160 MG tablet Take 1 tablet (160 mg total) by mouth daily. 04/10/23   Sharlene Dory, PA-C      Allergies    Ibuprofen, Lisinopril, Pregabalin, and Gabapentin    Review of Systems   Review of Systems  Cardiovascular:  Positive for syncope.  Musculoskeletal:  Positive for back pain.  Neurological:  Positive for dizziness and light-headedness.  All other systems reviewed and are negative.   Physical Exam Updated Vital Signs BP (!) 179/79   Pulse 74   Temp 97.7 F (36.5 C)   Resp 20  Ht 5\' 7"  (1.702 m)   Wt 89.4 kg   SpO2 100%   BMI 30.85 kg/m  Physical Exam Vitals and nursing note reviewed.   Gen: NAD Eyes: PERRL, EOMI HEENT: no oropharyngeal swelling Neck: trachea midline Resp: clear to auscultation bilaterally Card: RRR, no murmurs, rubs, or gallops Abd: nontender, nondistended Extremities: no calf tenderness, no edema Vascular: 2+ radial pulses bilaterally, 2+ DP pulses bilaterally Neuro: NIH stroke scale of 0, the patient does have mild weakness in the left upper and left lower extremities at his baseline with no drift that he reports is at his baseline Skin: no rashes Psyc: acting appropriately   ED Results / Procedures / Treatments   Labs (all labs ordered are  listed, but only abnormal results are displayed) Labs Reviewed  BASIC METABOLIC PANEL - Abnormal; Notable for the following components:      Result Value   Potassium 3.3 (*)    Glucose, Bld 120 (*)    BUN 25 (*)    Creatinine, Ser 1.25 (*)    GFR, Estimated 58 (*)    All other components within normal limits  CBC - Abnormal; Notable for the following components:   Hemoglobin 12.8 (*)    All other components within normal limits  TROPONIN I (HIGH SENSITIVITY)    EKG EKG Interpretation Date/Time:  Saturday August 02 2023 07:01:25 EST Ventricular Rate:  64 PR Interval:  215 QRS Duration:  169 QT Interval:  436 QTC Calculation: 450 R Axis:   48  Text Interpretation: Sinus rhythm Borderline prolonged PR interval Right bundle branch block Confirmed by Beckey Downing [id number]) on 08/02/2023 8:12:57 AM  Radiology CT Lumbar Spine Wo Contrast Result Date: 08/02/2023 CLINICAL DATA:  82 year old male status post syncope. Dizziness. Vomiting. Hyperglycemia. EXAM: CT LUMBAR SPINE WITHOUT CONTRAST TECHNIQUE: Multidetector CT imaging of the lumbar spine was performed without intravenous contrast administration. Multiplanar CT image reconstructions were also generated. RADIATION DOSE REDUCTION: This exam was performed according to the departmental dose-optimization program which includes automated exposure control, adjustment of the mA and/or kV according to patient size and/or use of iterative reconstruction technique. COMPARISON:  Lumbar CT myelogram 04/07/2023. FINDINGS: Segmentation: Normal. Alignment: Stable lumbar lordosis since August. Mild chronic retrolisthesis L2 on L3. Mild chronic anterolisthesis L5 on S1. Vertebrae: Background bone mineralization is within normal limits for age. Stable visualized osseous structures. Intact visible sacrum and SI joints. Chronic postoperative details are below. Asymmetric anterior right SI joint degeneration. Paraspinal and other soft tissues: Chronic left  lower thorax posterior subcutaneous spinal stimulator type generator device. Partially visible electrical lead there is stable. Aortoiliac calcified atherosclerosis. Stable visible noncontrast abdominal viscera. Partially visible gastric hiatal hernia. Chronic postoperative changes to the lower lumbar paraspinal soft tissues are stable. Disc levels: Widespread lumbar spine degeneration appears stable since the August CT myelogram (please see that report). Chronic decompression and fusion at L5-S1 with solid arthrodesis, no hardware loosening. IMPRESSION: 1. No acute osseous abnormality in the Lumbar Spine. 2. Chronic L5-S1 decompression and fusion with no adverse features. Other chronic lumbar spine degeneration appears stable from CT myelogram August CT this year. 3. Left side thoracic spinal stimulator partially visible. Aortic Atherosclerosis (ICD10-I70.0). Gastric hiatal hernia. Electronically Signed   By: Odessa Fleming M.D.   On: 08/02/2023 08:40   CT Head Wo Contrast Result Date: 08/02/2023 CLINICAL DATA:  82 year old male status post syncope. Dizziness. Vomiting. Hyperglycemia. EXAM: CT HEAD WITHOUT CONTRAST TECHNIQUE: Contiguous axial images were obtained from the base of the skull through  the vertex without intravenous contrast. RADIATION DOSE REDUCTION: This exam was performed according to the departmental dose-optimization program which includes automated exposure control, adjustment of the mA and/or kV according to patient size and/or use of iterative reconstruction technique. COMPARISON:  Head CT 04/22/2021. FINDINGS: Brain: Chronic right superior frontal gyrus encephalomalacia suspicious for previous distal right ACA territory infarct, stable. Stable cerebral volume. No midline shift, ventriculomegaly, mass effect, evidence of mass lesion, intracranial hemorrhage or evidence of cortically based acute infarction. Stable gray-white matter differentiation throughout the brain. Vascular: No suspicious  intracranial vascular hyperdensity. Calcified atherosclerosis at the skull base. Skull: Chronic left lamina papyracea fracture. No acute osseous abnormality identified. Sinuses/Orbits: Chronic opacification of the left tympanic cavity and mastoids is stable and has been present since at least 2019. Other Visualized paranasal sinuses and mastoids are stable and well aerated. Other: No acute orbit or scalp soft tissue finding. IMPRESSION: No acute intracranial abnormality. Stable non contrast CT appearance of the brain, chronic right ACA territory infarct. Electronically Signed   By: Odessa Fleming M.D.   On: 08/02/2023 08:36    Procedures Procedures    Medications Ordered in ED Medications  sodium chloride 0.9 % bolus 500 mL (0 mLs Intravenous Stopped 08/02/23 1104)  meclizine (ANTIVERT) tablet 12.5 mg (12.5 mg Oral Given 08/02/23 0821)  acetaminophen (TYLENOL) tablet 650 mg (650 mg Oral Given 08/02/23 0820)  HYDROcodone-acetaminophen (NORCO/VICODIN) 5-325 MG per tablet 1 tablet (1 tablet Oral Given 08/02/23 1122)    ED Course/ Medical Decision Making/ A&P                                 Medical Decision Making 82 year old male with past medical history of hypertension and hyperlipidemia as well as CVA in the past with left-sided deficits presenting to the emergency department today with dizziness/syncope.  I will further evaluate the patient here with basic labs as well as an EKG and telemetry to further evaluate for anemia, electrolyte abnormalities, arrhythmias.  Will obtain a CT scan of his head and lumbar spine to evaluate for acute traumatic injuries from his fall.  I do not appreciate any significant nystagmus or dysmetria on finger-to-nose testing here.  I will give the patient meclizine in the event this is due to vertigo.  Will give the patient a small IV fluid bolus and reevaluate for ultimate disposition.  The patient's EKG interpreted by me shows a sinus rhythm with right bundle branch  block and prolonged PR interval similar to previous EKGs.  The patient's workup here is reassuring.  He did not have any further lightheadedness here and was ambulatory with a walker at baseline.  We did discuss admission for further evaluation and telemetry but ultimately the patient is discharged through shared decision making.  He states that he did take his diuretic as well as his blood pressure medication at the same time prior to this event occurring.  He is encouraged to go back to staggering these medications.  He is ultimately discharged through shared decision making.  Amount and/or Complexity of Data Reviewed Labs: ordered. Radiology: ordered.  Risk OTC drugs. Prescription drug management.           Final Clinical Impression(s) / ED Diagnoses Final diagnoses:  Episodic lightheadedness    Rx / DC Orders ED Discharge Orders          Ordered    meclizine (ANTIVERT) 12.5 MG tablet  3 times daily  PRN        08/02/23 1334              Durwin Glaze, MD 08/02/23 1336

## 2023-08-02 NOTE — ED Notes (Signed)
[facility] called and setup transportation.

## 2023-08-02 NOTE — ED Triage Notes (Addendum)
Pt arrives EMS with reports of syncopal episode this AM while speaking with family. Family reports pt was "out of if for 6 minutes." Pt had one episode of emesis after episode. Pt denies any pain or complaints at present but reports feeling dizzy prior to episode. Pt alert and oriented x4. CBG 160 with EMS

## 2023-08-11 ENCOUNTER — Encounter (HOSPITAL_COMMUNITY): Payer: Self-pay

## 2023-08-11 ENCOUNTER — Observation Stay (HOSPITAL_COMMUNITY)
Admission: EM | Admit: 2023-08-11 | Discharge: 2023-08-12 | Disposition: A | Discharge status: Home Health | Payer: 59 | Attending: Family Medicine | Admitting: Family Medicine

## 2023-08-11 ENCOUNTER — Other Ambulatory Visit: Payer: Self-pay

## 2023-08-11 DIAGNOSIS — R2681 Unsteadiness on feet: Secondary | ICD-10-CM | POA: Insufficient documentation

## 2023-08-11 DIAGNOSIS — M6281 Muscle weakness (generalized): Secondary | ICD-10-CM | POA: Diagnosis not present

## 2023-08-11 DIAGNOSIS — R262 Difficulty in walking, not elsewhere classified: Secondary | ICD-10-CM | POA: Diagnosis not present

## 2023-08-11 DIAGNOSIS — Z79899 Other long term (current) drug therapy: Secondary | ICD-10-CM | POA: Diagnosis not present

## 2023-08-11 DIAGNOSIS — E119 Type 2 diabetes mellitus without complications: Secondary | ICD-10-CM | POA: Diagnosis not present

## 2023-08-11 DIAGNOSIS — R55 Syncope and collapse: Secondary | ICD-10-CM | POA: Diagnosis not present

## 2023-08-11 DIAGNOSIS — Z8673 Personal history of transient ischemic attack (TIA), and cerebral infarction without residual deficits: Secondary | ICD-10-CM | POA: Diagnosis not present

## 2023-08-11 DIAGNOSIS — I10 Essential (primary) hypertension: Secondary | ICD-10-CM | POA: Diagnosis present

## 2023-08-11 DIAGNOSIS — Z7901 Long term (current) use of anticoagulants: Secondary | ICD-10-CM | POA: Diagnosis not present

## 2023-08-11 DIAGNOSIS — Z87891 Personal history of nicotine dependence: Secondary | ICD-10-CM | POA: Insufficient documentation

## 2023-08-11 DIAGNOSIS — R269 Unspecified abnormalities of gait and mobility: Secondary | ICD-10-CM | POA: Insufficient documentation

## 2023-08-11 DIAGNOSIS — I2510 Atherosclerotic heart disease of native coronary artery without angina pectoris: Secondary | ICD-10-CM | POA: Insufficient documentation

## 2023-08-11 DIAGNOSIS — I5032 Chronic diastolic (congestive) heart failure: Secondary | ICD-10-CM | POA: Diagnosis not present

## 2023-08-11 DIAGNOSIS — I130 Hypertensive heart and chronic kidney disease with heart failure and stage 1 through stage 4 chronic kidney disease, or unspecified chronic kidney disease: Secondary | ICD-10-CM | POA: Insufficient documentation

## 2023-08-11 DIAGNOSIS — E1169 Type 2 diabetes mellitus with other specified complication: Secondary | ICD-10-CM

## 2023-08-11 DIAGNOSIS — L8990 Pressure ulcer of unspecified site, unspecified stage: Secondary | ICD-10-CM | POA: Insufficient documentation

## 2023-08-11 DIAGNOSIS — N179 Acute kidney failure, unspecified: Secondary | ICD-10-CM | POA: Diagnosis present

## 2023-08-11 DIAGNOSIS — I110 Hypertensive heart disease with heart failure: Secondary | ICD-10-CM | POA: Diagnosis not present

## 2023-08-11 DIAGNOSIS — E876 Hypokalemia: Secondary | ICD-10-CM | POA: Diagnosis not present

## 2023-08-11 LAB — CBC
HCT: 39.2 % (ref 39.0–52.0)
Hemoglobin: 12.1 g/dL — ABNORMAL LOW (ref 13.0–17.0)
MCH: 27.3 pg (ref 26.0–34.0)
MCHC: 30.9 g/dL (ref 30.0–36.0)
MCV: 88.5 fL (ref 80.0–100.0)
Platelets: 214 10*3/uL (ref 150–400)
RBC: 4.43 MIL/uL (ref 4.22–5.81)
RDW: 14.6 % (ref 11.5–15.5)
WBC: 5 10*3/uL (ref 4.0–10.5)
nRBC: 0 % (ref 0.0–0.2)

## 2023-08-11 LAB — TROPONIN I (HIGH SENSITIVITY)
Troponin I (High Sensitivity): 8 ng/L (ref <18)
Troponin I (High Sensitivity): 8 ng/L (ref <18)

## 2023-08-11 LAB — CBG MONITORING, ED
Glucose-Capillary: 117 mg/dL — ABNORMAL HIGH (ref 70–99)
Glucose-Capillary: 96 mg/dL (ref 70–99)

## 2023-08-11 LAB — BASIC METABOLIC PANEL
Anion gap: 9 (ref 5–15)
BUN: 14 mg/dL (ref 8–23)
CO2: 23 mmol/L (ref 22–32)
Calcium: 9.4 mg/dL (ref 8.9–10.3)
Chloride: 105 mmol/L (ref 98–111)
Creatinine, Ser: 1.41 mg/dL — ABNORMAL HIGH (ref 0.61–1.24)
GFR, Estimated: 50 mL/min — ABNORMAL LOW (ref >60)
Glucose, Bld: 190 mg/dL — ABNORMAL HIGH (ref 70–99)
Potassium: 3.7 mmol/L (ref 3.5–5.1)
Sodium: 137 mmol/L (ref 135–145)

## 2023-08-11 MED ORDER — SENNOSIDES-DOCUSATE SODIUM 8.6-50 MG PO TABS: 1.0000 | ORAL_TABLET | Freq: Every evening | ORAL | Status: DC | PRN

## 2023-08-11 MED ORDER — ONDANSETRON HCL 4 MG/2ML IJ SOLN: 4.0000 mg | Freq: Four times a day (QID) | INTRAMUSCULAR | Status: DC | PRN

## 2023-08-11 MED ORDER — ACETAMINOPHEN 650 MG RE SUPP: 650.0000 mg | Freq: Four times a day (QID) | RECTAL | Status: DC | PRN

## 2023-08-11 MED ORDER — ASPIRIN 81 MG PO TBEC
81.0000 mg | DELAYED_RELEASE_TABLET | Freq: Every day | ORAL | Status: DC
  Administered 2023-08-12: 81 mg via ORAL
  Filled 2023-08-11: qty 1

## 2023-08-11 MED ORDER — HEPARIN SODIUM (PORCINE) 1000 UNIT/ML IJ SOLN
INTRAMUSCULAR | Status: AC
  Filled 2023-08-11: qty 10

## 2023-08-11 MED ORDER — ATORVASTATIN CALCIUM 10 MG PO TABS
20.0000 mg | ORAL_TABLET | Freq: Every day | ORAL | Status: DC
  Administered 2023-08-12: 20 mg via ORAL
  Filled 2023-08-11: qty 2

## 2023-08-11 MED ORDER — PREGABALIN 100 MG PO CAPS
100.0000 mg | ORAL_CAPSULE | Freq: Two times a day (BID) | ORAL | Status: DC
  Administered 2023-08-12: 100 mg via ORAL
  Filled 2023-08-11: qty 1

## 2023-08-11 MED ORDER — ONDANSETRON HCL 4 MG PO TABS: 4.0000 mg | ORAL_TABLET | Freq: Four times a day (QID) | ORAL | Status: DC | PRN

## 2023-08-11 MED ORDER — INSULIN ASPART 100 UNIT/ML IJ SOLN: 0.0000 [IU] | Freq: Every day | INTRAMUSCULAR | Status: DC

## 2023-08-11 MED ORDER — ACETAMINOPHEN 325 MG PO TABS: 650.0000 mg | ORAL_TABLET | Freq: Four times a day (QID) | ORAL | Status: DC | PRN

## 2023-08-11 MED ORDER — SODIUM CHLORIDE 0.9 % IV BOLUS
500.0000 mL | Freq: Once | INTRAVENOUS | Status: AC
  Administered 2023-08-11: 500 mL via INTRAVENOUS

## 2023-08-11 MED ORDER — ALLOPURINOL 100 MG PO TABS
100.0000 mg | ORAL_TABLET | Freq: Every day | ORAL | Status: DC
  Administered 2023-08-12: 100 mg via ORAL
  Filled 2023-08-11: qty 1

## 2023-08-11 MED ORDER — SODIUM CHLORIDE 0.9% FLUSH
3.0000 mL | Freq: Two times a day (BID) | INTRAVENOUS | Status: DC
  Administered 2023-08-12 (×2): 3 mL via INTRAVENOUS

## 2023-08-11 MED ORDER — INSULIN ASPART 100 UNIT/ML IJ SOLN: 0.0000 [IU] | Freq: Three times a day (TID) | INTRAMUSCULAR | Status: DC

## 2023-08-11 MED ORDER — ENOXAPARIN SODIUM 40 MG/0.4ML IJ SOSY
40.0000 mg | PREFILLED_SYRINGE | INTRAMUSCULAR | Status: DC
  Administered 2023-08-12: 40 mg via SUBCUTANEOUS
  Filled 2023-08-11: qty 0.4

## 2023-08-11 NOTE — Hospital Course (Signed)
Luis Poole is a 82 y.o. 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

## 2023-08-11 NOTE — ED Provider Triage Note (Signed)
Emergency Medicine Provider Triage Evaluation Note  Luis Poole , a 82 y.o. male  was evaluated in triage.  Pt complains of syncope.  Pt not responding.    Review of Systems  Positive:  Negative:   Physical Exam  BP (!) 142/78 (BP Location: Right Arm)   Pulse 71   Temp 98.3 F (36.8 C) (Oral)   Resp 16   Ht 5\' 7"  (1.702 m)   Wt 89.4 kg   SpO2 100%   BMI 30.85 kg/m  Gen:   Unresponsive  Resp:  Normal effort  MSK:    Other:    Medical Decision Making  Medically screening exam initiated at 5:01 PM.  Appropriate orders placed.  Antony Odea was informed that the remainder of the evaluation will be completed by another provider, this initial triage assessment does not replace that evaluation, and the importance of remaining in the ED until their evaluation is complete.  I sternal rubbed pt with no response,  I moved pt to exam room,  Pt awake,  responding.  Moves all extremities    Elson Areas, [place] 08/11/23 1702

## 2023-08-11 NOTE — ED Provider Notes (Signed)
[place] EMERGENCY DEPARTMENT AT [facility] Provider Note   CSN: [id number] Arrival date & time: 08/11/23  1524     History  Chief Complaint  Patient presents with   Loss of Consciousness    Luis Poole is a 82 y.o. male.   Loss of Consciousness Patient with loss conscious/syncope.  Has had a couple episodes now in the last couple days.  2 today.  Had 1 while in the car.  States he was feeling lightheaded/dizzy.  Felt if he could pass out.  Reportedly did pass out.  Had another episode in the waiting room and shortly after examining he had another episode that I witnessed.  Was awake.  States he felt dizzy.  However good vitals during the episode.  Reviewed previous notes and has had episodes of passing out previously. Patient states he has a spine stimulator in.    Past Medical History:  Diagnosis Date   Anemia, unspecified 06/08/2013   Cataract    CHF (congestive heart failure) (HCC)    Chronic kidney disease    Diabetes mellitus    Foot drop, left foot    AFO   Hypertension    PAD (peripheral artery disease) (HCC)    S/P lumbar fusion    Sequelae of cerebral infarction    Stroke [facility])    Past Surgical History:  Procedure Laterality Date   CATARACT EXTRACTION     EYE SURGERY     KNEE SURGERY  2006   NECK SURGERY  2012   SPINE SURGERY  2009   TONSILECTOMY, ADENOIDECTOMY, BILATERAL MYRINGOTOMY AND TUBES       Home Medications Prior to Admission medications   Medication Sig Start Date End Date Taking? Authorizing Provider  allopurinol (ZYLOPRIM) 100 MG tablet Take 100 mg by mouth daily.   Yes [provider]  amLODipine (NORVASC) 10 MG tablet Take 10 mg by mouth daily.    Yes [provider]  aspirin EC 81 MG tablet Take 81 mg by mouth daily.   Yes [provider]  atorvastatin (LIPITOR) 20 MG tablet Take 1 tablet (20 mg total) by mouth daily. 10/09/17  Yes Burnadette Pop, MD  colchicine 0.6 MG tablet Take 1 tablet (0.6  mg total) daily by mouth. Patient taking differently: Take 0.6 mg by mouth daily as needed (gout pain). 06/29/17  Yes Maxie Barb, MD  cyclobenzaprine (FLEXERIL) 10 MG tablet Take 1 tablet (10 mg total) by mouth 2 (two) times daily as needed for muscle spasms. 12/28/22  Yes Jeanelle Malling, PA  ferrous sulfate 325 (65 FE) MG tablet Take 325 mg by mouth daily with breakfast.   Yes [provider]  furosemide (LASIX) 20 MG tablet Take 20 mg by mouth 2 (two) times daily. 02/27/23  Yes [provider]  meclizine (ANTIVERT) 12.5 MG tablet Take 1 tablet (12.5 mg total) by mouth 3 (three) times daily as needed for dizziness. 08/02/23  Yes Durwin Glaze, MD  polyethylene glycol (MIRALAX / GLYCOLAX) 17 g packet Take 17 g by mouth daily as needed for mild constipation. 11/29/22  Yes Calvert Cantor, MD  pregabalin (LYRICA) 100 MG capsule Take 100 mg by mouth 2 (two) times daily.   Yes [provider]  valsartan (DIOVAN) 160 MG tablet Take 1 tablet (160 mg total) by mouth daily. 04/10/23  Yes Conte, Tessa N, PA-C      Allergies    Ibuprofen, Lisinopril, Pregabalin, and Gabapentin    Review of Systems  Review of Systems  Cardiovascular:  Positive for syncope.    Physical Exam Updated Vital Signs BP 131/74   Pulse 65   Temp 98.4 F (36.9 C) (Oral)   Resp 10   Ht 5\' 7"  (1.702 m)   Wt 89.4 kg   SpO2 100%   BMI 30.85 kg/m  Physical Exam Vitals reviewed.  HENT:     Head: Atraumatic.  Cardiovascular:     Rate and Rhythm: Regular rhythm.  Pulmonary:     Breath sounds: No wheezing.  Abdominal:     Tenderness: There is no abdominal tenderness.  Musculoskeletal:        General: No tenderness.     Cervical back: Neck supple.  Neurological:     Mental Status: He is alert. Mental status is at baseline.     ED Results / Procedures / Treatments   Labs (all labs ordered are listed, but only abnormal results are displayed) Labs Reviewed  BASIC METABOLIC PANEL -  Abnormal; Notable for the following components:      Result Value   Glucose, Bld 190 (*)    Creatinine, Ser 1.41 (*)    GFR, Estimated 50 (*)    All other components within normal limits  CBC - Abnormal; Notable for the following components:   Hemoglobin 12.1 (*)    All other components within normal limits  CBG MONITORING, ED - Abnormal; Notable for the following components:   Glucose-Capillary 117 (*)    All other components within normal limits  URINALYSIS, ROUTINE W REFLEX MICROSCOPIC  TROPONIN I (HIGH SENSITIVITY)  TROPONIN I (HIGH SENSITIVITY)    EKG EKG Interpretation Date/Time:  Monday August 11 2023 17:01:48 EST Ventricular Rate:  63 PR Interval:  197 QRS Duration:  157 QT Interval:  444 QTC Calculation: 455 R Axis:   31  Text Interpretation: Sinus arrhythmia Right bundle branch block No significant change since last tracing Confirmed by Benjiman Core [phone]) on 08/11/2023 5:03:00 PM  Radiology No results found.  Procedures Procedures    Medications Ordered in ED Medications  sodium chloride 0.9 % bolus 500 mL (0 mLs Intravenous Stopped 08/11/23 1830)    ED Course/ Medical Decision Making/ A&P                                 Medical Decision Making Amount and/or Complexity of Data Reviewed Labs: ordered.   Patient with syncope.  Has had history of same.  Had episode here and was on the monitor and did not show arrhythmia.  Blood pressure maintained during episode also.  States he feels dizzy before the episodes.  Seen in the ER at [facility] for similar symptoms previously.  Differential diagnosis along with includes cause such as arrhythmia, hypotension, seizure.  No visual seizure activity with the episode.  Blood work does show elevated creatinine.  Higher than baseline at higher than normal recent ER visit.  Will give fluid bolus.  Has had previous syncopal episode with admission thought to be secondary to hypotension/dehydration, however patient  was not standing or changing position at this time  Further discussed with Langston Masker, PA who was at the episode of severe syncope in the ER.  Was breathing spontaneously and did have a pulse, however was completely unresponsive including to sternal rub.  Did wake up relatively quicker after.  With previous stroke and worried of potential seizure activity.  Arrhythmia felt less likely.  With recurrent  episodes I feel he would benefit from mission the hospital.  Will discuss with hospitalist.        Final Clinical Impression(s) / ED Diagnoses Final diagnoses:  Syncope, unspecified syncope type    Rx / DC Orders ED Discharge Orders     None         Benjiman Core, MD 08/11/23 2033

## 2023-08-11 NOTE — ED Triage Notes (Signed)
Pt brought in by his nephew who reports he came out of the store to find the patient unresponsive and slumped over in the car at 1518. The nephew states he drove straight here and the pt became responsive on the way here. Pt reports he remembers feeling dizzy beforehand. He is currently A&Ox4 and still feels dizzy. Skin warm and dry, speaking clear complete sentences. He reports left leg numbness and back pain that started this morning at 5am. Similar episode happened last week and he was seen at [facility] ER.

## 2023-08-11 NOTE — ED Notes (Signed)
Patient was hungry. Ginger ale and some peanut butter and graham crackers provided.

## 2023-08-11 NOTE — H&P (Signed)
History and Physical    Luis Poole WGN:[id number] DOB: September 18, 1940 DOA: 08/11/2023  PCP: Tracey Harries, MD  Patient coming from: Home  I have personally briefly reviewed patient's old medical records in [facility]  Chief Complaint: Syncope  HPI: Luis Poole is a 82 y.o. male with medical history significant for chronic HFpEF, history of CVA, diet-controlled T2DM, HTN, HLD, lumbar spinal stenosis with chronic pain s/p spinal cord stimulator who presented to the ED for evaluation of syncope.  Patient states that he was going to [employer] with his nephew.  Patient was waiting a car while as he went to the store.  Patient states he became lightheaded and dizzy while sitting down and then passed out.  His nephew found him slumped over in the car at 1518.  Patient was unresponsive.  His nephew drove him straight to the hospital and stated the patient became responsive en route to the ED.  While in the waiting room patient had another episode where he became lightheaded and passed out again while sitting.  Again he was completely unresponsive to ED staff but did have a palpable pulse and was breathing spontaneously.  He regained consciousness after short period of time.  Patient states that he does also experience nausea and some diaphoresis prior to these syncopal episodes.  He has not had any chest pain, palpitations, dyspnea.  He he has chronic weakness and pain involving his left lower extremity.  Not really changed from baseline.  No new weakness in his extremities.  Patient does note that he had an episode of emesis last night.  Patient was admitted in April 2024 with a near syncopal episode, this was felt to be orthostatic.  ED Course  Labs/Imaging on admission: I have personally reviewed following labs and imaging studies.  Initial vitals showed BP 142/78, pulse 71, RR 16, temp 98.3 F, SpO2 100% on room air.  Labs showed WBC 5.0, hemoglobin 12.1, platelets 214,000, sodium 137,  potassium 3.7, bicarb 23, BUN 14, creatinine 1.41, serum glucose 190, troponin 8 x 2.  Patient was given 500 cc normal saline.  The hospitalist service was consulted to admit for syncope evaluation.  Review of Systems: All systems reviewed and are negative except as documented in history of present illness above.   Past Medical History:  Diagnosis Date   Anemia, unspecified 06/08/2013   Cataract    CHF (congestive heart failure) (HCC)    Chronic kidney disease    Diabetes mellitus    Foot drop, left foot    AFO   Hypertension    PAD (peripheral artery disease) (HCC)    S/P lumbar fusion    Sequelae of cerebral infarction    Stroke [facility])     Past Surgical History:  Procedure Laterality Date   CATARACT EXTRACTION     EYE SURGERY     KNEE SURGERY  2006   NECK SURGERY  2012   SPINE SURGERY  2009   TONSILECTOMY, ADENOIDECTOMY, BILATERAL MYRINGOTOMY AND TUBES      Social History:  reports that he has quit smoking. He has quit using smokeless tobacco. He reports that he does not drink alcohol and does not use drugs.  Allergies  Allergen Reactions   Ibuprofen Itching   Lisinopril Anaphylaxis   Pregabalin Swelling   Gabapentin Other (See Comments)    Dizziness    Family History  Problem Relation Age of Onset   Stroke Mother      Prior to Admission medications  Medication Sig Start Date End Date Taking? Authorizing Provider  allopurinol (ZYLOPRIM) 100 MG tablet Take 100 mg by mouth daily.   Yes [provider]  amLODipine (NORVASC) 10 MG tablet Take 10 mg by mouth daily.    Yes [provider]  aspirin EC 81 MG tablet Take 81 mg by mouth daily.   Yes [provider]  atorvastatin (LIPITOR) 20 MG tablet Take 1 tablet (20 mg total) by mouth daily. 10/09/17  Yes Burnadette Pop, MD  colchicine 0.6 MG tablet Take 1 tablet (0.6 mg total) daily by mouth. Patient taking differently: Take 0.6 mg by mouth daily as needed (gout pain). 06/29/17  Yes  Maxie Barb, MD  cyclobenzaprine (FLEXERIL) 10 MG tablet Take 1 tablet (10 mg total) by mouth 2 (two) times daily as needed for muscle spasms. 12/28/22  Yes Jeanelle Malling, PA  ferrous sulfate 325 (65 FE) MG tablet Take 325 mg by mouth daily with breakfast.   Yes [provider]  furosemide (LASIX) 20 MG tablet Take 20 mg by mouth 2 (two) times daily. 02/27/23  Yes [provider]  meclizine (ANTIVERT) 12.5 MG tablet Take 1 tablet (12.5 mg total) by mouth 3 (three) times daily as needed for dizziness. 08/02/23  Yes Durwin Glaze, MD  polyethylene glycol (MIRALAX / GLYCOLAX) 17 g packet Take 17 g by mouth daily as needed for mild constipation. 11/29/22  Yes Calvert Cantor, MD  pregabalin (LYRICA) 100 MG capsule Take 100 mg by mouth 2 (two) times daily.   Yes [provider]  valsartan (DIOVAN) 160 MG tablet Take 1 tablet (160 mg total) by mouth daily. 04/10/23  Yes Sharlene Dory, [place]    Physical Exam: Vitals:   08/11/23 1542 08/11/23 1548 08/11/23 1745 08/11/23 1936  BP: (!) 142/78  (!) 140/69 131/74  Pulse: 71  68 65  Resp: 16  18 10   Temp: 98.3 F (36.8 C)   98.4 F (36.9 C)  TempSrc: Oral   Oral  SpO2: 100%  97% 100%  Weight:  89.4 kg    Height:  5\' 7"  (1.702 m)     Constitutional: Resting in bed, NAD, calm, comfortable Eyes: EOMI, lids and conjunctivae normal ENMT: Mucous membranes are moist. Posterior pharynx clear of any exudate or lesions.Normal dentition.  Neck: normal, supple, no masses. Respiratory: clear to auscultation bilaterally, no wheezing, no crackles. Normal respiratory effort. No accessory muscle use.  Cardiovascular: Regular rate and rhythm, no murmurs / rubs / gallops. No extremity edema. 2+ pedal pulses. Abdomen: no tenderness, no masses palpated.  Musculoskeletal: no clubbing / cyanosis. No joint deformity upper and lower extremities. Good ROM, no contractures. Normal muscle tone.  Skin: no rashes, lesions, ulcers. No  induration Neurologic: Sensation intact. Strength 5/5 in all 4.  Psychiatric: Normal judgment and insight. Alert and oriented x 3. Normal mood.   EKG: Personally reviewed. Sinus arrhythmia, RBBB, rate 63.  Previous EKG showed sinus rhythm, RBBB, first-degree AV block.  Assessment/Plan Principal Problem:   Syncope Active Problems:   AKI (acute kidney injury) (HCC)   Chronic diastolic heart failure (HCC)   History of CVA (cerebrovascular accident)   Hypertension   Diabetes mellitus type 2 in [facility])   ODAY Luis Poole is a 82 y.o. 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  Assessment and Plan: Syncope: Patient with  2 syncopal episodes occurring while at rest.  Possibly vasovagal.  He does also report orthostatic symptoms when he stands up quickly.  Likely somewhat volume depleted as well. -Keep on telemetry -Check orthostatic vitals -Update echocardiogram -PT/OT eval -Might benefit from outpatient heart monitor  Acute kidney injury: Creatinine 1.40 on admission compared to baseline around 1.1.  Slightly volume depleted due to episode of emesis and diuretics. -Given 500 cc normal saline bolus in the ED -Encourage continued oral hydration -Holding Lasix and valsartan  Chronic HFpEF: Last EF >75%, hyperdynamic LV function, trivial AV regurgitation, aortic valve sclerosis without evidence of stenosis by echocardiogram 11/26/2022.  Likely mildly hypovolemic on admission. -Holding Lasix -Update echocardiogram as above  History of CVA: Continue aspirin and atorvastatin.  Hypertension: BP stable.  Holding antihypertensives pending orthostatic vitals.  Type 2 diabetes: Placed on SSI.  Diet controlled as an outpatient.  Chronic back pain/lumbar stenosis s/p spinal cord stimulator: Patient reports chronic left  lower extremity weakness.  Continue Lyrica.   DVT prophylaxis: enoxaparin (LOVENOX) injection 40 mg Start: 08/11/23 2200 Code Status: Full code, confirmed with patient on admission Family Communication: Discussed with patient, he has discussed with family Disposition Plan: From home and likely discharge to home pending clinical progress Consults called: None Severity of Illness: The appropriate patient status for this patient is OBSERVATION. Observation status is judged to be reasonable and necessary in order to provide the required intensity of service to ensure the patient's safety. The patient's presenting symptoms, physical exam findings, and initial radiographic and laboratory data in the context of their medical condition is felt to place them at decreased risk for further clinical deterioration. Furthermore, it is anticipated that the patient will be medically stable for discharge from the hospital within 2 midnights of admission.   Darreld Mclean MD [facility]  If 7PM-7AM, please contact night-coverage [email or web address]  08/11/2023, 10:08 PM

## 2023-08-11 NOTE — ED Notes (Signed)
Pt. Unable to go to bathroom.

## 2023-08-12 ENCOUNTER — Other Ambulatory Visit: Payer: Self-pay | Admitting: Student

## 2023-08-12 ENCOUNTER — Observation Stay (HOSPITAL_COMMUNITY): Payer: 59

## 2023-08-12 DIAGNOSIS — I5032 Chronic diastolic (congestive) heart failure: Secondary | ICD-10-CM

## 2023-08-12 DIAGNOSIS — R55 Syncope and collapse: Secondary | ICD-10-CM

## 2023-08-12 DIAGNOSIS — N179 Acute kidney failure, unspecified: Secondary | ICD-10-CM | POA: Diagnosis not present

## 2023-08-12 DIAGNOSIS — I2510 Atherosclerotic heart disease of native coronary artery without angina pectoris: Secondary | ICD-10-CM | POA: Diagnosis not present

## 2023-08-12 DIAGNOSIS — E876 Hypokalemia: Secondary | ICD-10-CM | POA: Insufficient documentation

## 2023-08-12 DIAGNOSIS — L8990 Pressure ulcer of unspecified site, unspecified stage: Secondary | ICD-10-CM | POA: Insufficient documentation

## 2023-08-12 DIAGNOSIS — E119 Type 2 diabetes mellitus without complications: Secondary | ICD-10-CM

## 2023-08-12 LAB — ECHOCARDIOGRAM COMPLETE
AR max vel: 2.26 cm2
AV Area VTI: 3.15 cm2
AV Area mean vel: 2.18 cm2
AV Mean grad: 6 mm[Hg]
AV Peak grad: 8.9 mm[Hg]
Ao pk vel: 1.49 m/s
Area-P 1/2: 2.03 cm2
Est EF: >75
Height: 67 in
S' Lateral: 2.5 cm
Weight: 3072.33 [oz_av]

## 2023-08-12 LAB — BASIC METABOLIC PANEL
Anion gap: 8 (ref 5–15)
BUN: 15 mg/dL (ref 8–23)
CO2: 25 mmol/L (ref 22–32)
Calcium: 8.6 mg/dL — ABNORMAL LOW (ref 8.9–10.3)
Chloride: 107 mmol/L (ref 98–111)
Creatinine, Ser: 1.13 mg/dL (ref 0.61–1.24)
GFR, Estimated: >60 mL/min (ref >60)
Glucose, Bld: 139 mg/dL — ABNORMAL HIGH (ref 70–99)
Potassium: 3.3 mmol/L — ABNORMAL LOW (ref 3.5–5.1)
Sodium: 140 mmol/L (ref 135–145)

## 2023-08-12 LAB — GLUCOSE, CAPILLARY
Glucose-Capillary: 91 mg/dL (ref 70–99)
Glucose-Capillary: 107 mg/dL — ABNORMAL HIGH (ref 70–99)

## 2023-08-12 LAB — CBC
HCT: 32.4 % — ABNORMAL LOW (ref 39.0–52.0)
Hemoglobin: 10.2 g/dL — ABNORMAL LOW (ref 13.0–17.0)
MCH: 27.3 pg (ref 26.0–34.0)
MCHC: 31.5 g/dL (ref 30.0–36.0)
MCV: 86.9 fL (ref 80.0–100.0)
Platelets: 189 10*3/uL (ref 150–400)
RBC: 3.73 MIL/uL — ABNORMAL LOW (ref 4.22–5.81)
RDW: 14.8 % (ref 11.5–15.5)
WBC: 4.9 10*3/uL (ref 4.0–10.5)
nRBC: 0 % (ref 0.0–0.2)

## 2023-08-12 MED ORDER — AMLODIPINE BESYLATE 10 MG PO TABS: 5.0000 mg | ORAL_TABLET | Freq: Every day | ORAL | Status: DC

## 2023-08-12 MED ORDER — POTASSIUM CHLORIDE 20 MEQ PO PACK: 60.0000 meq | PACK | Freq: Once | ORAL | Status: DC

## 2023-08-12 MED ORDER — POTASSIUM CHLORIDE 20 MEQ PO PACK
20.0000 meq | PACK | ORAL | Status: DC
  Administered 2023-08-12: 20 meq via ORAL
  Filled 2023-08-12: qty 1

## 2023-08-12 MED ORDER — FUROSEMIDE 20 MG PO TABS: 20.0000 mg | ORAL_TABLET | Freq: Every day | ORAL | Status: DC

## 2023-08-12 MED ORDER — ORAL CARE MOUTH RINSE: 15.0000 mL | OROMUCOSAL | Status: DC | PRN

## 2023-08-12 NOTE — Discharge Summary (Signed)
Physician Discharge Summary   Patient: Luis Poole MRN: [id number] DOB: 07-24-1941  Admit date:     08/11/2023  Discharge date: 08/12/23  Discharge Physician: Alberteen Sam   PCP: Tracey Harries, MD     Recommendations at discharge:  Follow up with Dr. Everlene Other in 1 week for syncope Dr. Everlene Other: Please re-evaluate BP on new half dose furosemide and amlodipine and adjust as appropriate Please check K in 1 week (discharge K 3.3) Please verify receipt of cardiac monitor (referral sent)     Discharge Diagnoses: Principal Problem:   Syncope, presume due to antihypertensives   Active Problems:   History of CVA (cerebrovascular accident)   Hypertension   Diabetes mellitus type 2 in nonobese (HCC)   Chronic diastolic heart failure (HCC)   AKI (acute kidney injury) (HCC)   Coronary artery disease involving native coronary artery of native heart without angina pectoris   Hypokalemia      Hospital Course: 82 y.o. 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     * Syncope Was waiting for his son in the grocery store parking lot, got dizzy and hot, then passed out.  Did this again while seated in the ER waiting area.  In the ER, K 3.3, ECG showed stable RBBB.  Had similar episode in April this year, due to dehydration/furosemide.  Observed overnight on telemetry, had normal sinus rhythm, transient first-degree block.  EKG showed stable RBBB.  Echocardiogram obtained, showed hyperdynamic left ventricle, no change from previous.  Blood pressure was checked with physical therapy and he had no orthostasis.  He ambulated well with physical therapy, limited by his chronic lumbar stenosis.  Furosemide and amlodipine were reduced at discharge, close follow-up was recommended with PCP, cardiac monitor was requested.  Hypokalemia Potassium 3.3, supplemented prior to  discharge.            The [facility] Controlled Substances Registry was reviewed for this patient prior to discharge.   Consultants: None Procedures performed:  Echocardiogram   Disposition: Home health Diet recommendation:  Discharge Diet Orders (From admission, onward)     Start     Ordered   08/12/23 0000  Diet - low sodium heart healthy        08/12/23 1201             DISCHARGE MEDICATION: Allergies as of 08/12/2023       Reactions   Ibuprofen Itching   Lisinopril Anaphylaxis   Pregabalin Swelling   Recently filled 11/24 for 90ds and reports taking on 08/11/23   Gabapentin Other (See Comments)   Dizziness        Medication List     TAKE these medications    allopurinol 100 MG tablet Commonly known as: ZYLOPRIM Take 100 mg by mouth daily.   amLODipine 10 MG tablet Commonly known as: NORVASC Take 0.5 tablets (5 mg total) by mouth daily. What changed: how much to take   aspirin EC 81 MG tablet Take 81 mg by mouth daily.   atorvastatin 20 MG tablet Commonly known as: LIPITOR Take 1 tablet (20 mg total) by mouth daily.   colchicine 0.6 MG tablet Take 1 tablet (0.6 mg total) daily by mouth. What changed:  when to take this reasons to take this   cyclobenzaprine 10 MG tablet Commonly known as: FLEXERIL Take 1 tablet (10 mg total) by mouth 2 (two)  times daily as needed for muscle spasms.   ferrous sulfate 325 (65 FE) MG tablet Take 325 mg by mouth daily with breakfast.   furosemide 20 MG tablet Commonly known as: LASIX Take 1 tablet (20 mg total) by mouth daily. What changed: when to take this   meclizine 12.5 MG tablet Commonly known as: ANTIVERT Take 1 tablet (12.5 mg total) by mouth 3 (three) times daily as needed for dizziness.   polyethylene glycol 17 g packet Commonly known as: MIRALAX / GLYCOLAX Take 17 g by mouth daily as needed for mild constipation.   pregabalin 100 MG capsule Commonly known as: LYRICA Take 100  mg by mouth 2 (two) times daily.   valsartan 160 MG tablet Commonly known as: DIOVAN Take 1 tablet (160 mg total) by mouth daily.        Follow-up Information     Tracey Harries, MD. Schedule an appointment as soon as possible for a visit in 1 week(s).   Specialty: Family Medicine Contact information: [address] [place] [address] [phone]                 Discharge Instructions     Diet - low sodium heart healthy   Complete by: As directed    Discharge instructions   Complete by: As directed    **IMPORTANT DISCHARGE INSTRUCTIONS**   From Dr. Maryfrances Bunnell: You were admitted for dizziness and passing out.  Here, we found that you had no signs of heart attack or infection.  We did an ultrasound of your heart that was unchanged from your normal.  We found that your blood pressure was slightly low and so the passing out MAY have been from low blood pressure or it MAY have been from abnormal heart rhythms  In case it was from low blood pressure, reduce two of your blood pressure medicines:  REDUCE your amlodipine to 5 mg (break the tab in half)  REDUCE your furosemide/Lasix to 20 mg ONCE daily (instead of twice daily)  Resume all your other medicines as is Go see Dr. Everlene Other in ONE WEEK Ask him to recheck your blood pressure and make adjustments as needed  You may need to get refills of your Lasix and amlodipine if he keeps you at that dose  I have also asked Dr. Fabio Bering office to send you a cardiac monitor to wear for a month, which will help to figure out if it is from an abnormal heart rhythm  (We observed your heart rhythm overnight here and it was normal, but sometimes, the abnormal rhythms happen only a couple times a week)  Go see Dr. Everlene Other   Increase activity slowly   Complete by: As directed    No wound care   Complete by: As directed        Discharge Exam: Filed Weights   08/11/23 1548 08/12/23 0119  Weight: 89.4 kg  87.1 kg    General: Pt is alert, awake, not in acute distress Cardiovascular: RRR, nl S1-S2, no murmurs appreciated.   No LE edema.   Respiratory: Normal respiratory rate and rhythm.  CTAB without rales or wheezes. Abdominal: Abdomen soft and non-tender.  No distension or HSM.   Neuro/Psych: Chronic left leg weakness.  Speech fluent, oriented to person, place, and time, judgment and insight appear normal.    Condition at discharge: good  The results of significant diagnostics from this hospitalization (including imaging, microbiology, ancillary and laboratory) are listed below for reference.  Imaging Studies: ECHOCARDIOGRAM COMPLETE Result Date: 08/12/2023    ECHOCARDIOGRAM REPORT   Patient Name:   ACHYUTH WILBOURNE Date of Exam: 08/12/2023 Medical Rec #:  [id number]     Height:       67.0 in Accession #:    [id number]    Weight:       192.0 lb Date of Birth:  1941/02/14      BSA:          1.988 m Patient Age:    82 years      BP:           124/73 mmHg Patient Gender: M             HR:           64 bpm. Exam Location:  Inpatient Procedure: 2D Echo, Cardiac Doppler and Color Doppler Indications:    Syncope R55  History:        Patient has prior history of Echocardiogram examinations, most                 recent 11/26/2022. CHF, Stroke; Risk Factors:Hypertension and                 Diabetes.  Sonographer:    Darlys Gales Referring Phys: [id number] VISHAL R PATEL IMPRESSIONS  1. There is a mid cavitary gradient up to due to hyperdynamic function; There is no evidence of HCM. Left ventricular ejection fraction, by estimation, is >75%. Left ventricular ejection fraction by PLAX is 75 %. The left ventricle has hyperdynamic function. The left ventricle has no regional wall motion abnormalities. Left ventricular diastolic parameters are consistent with Grade I diastolic dysfunction (impaired relaxation).  2. Right ventricular systolic function is normal. The right ventricular size is normal.  3. The mitral  valve is degenerative. Trivial mitral valve regurgitation.  4. The aortic valve has an indeterminant number of cusps. Aortic valve regurgitation is trivial. Comparison(s): No significant change from prior study. Prior images reviewed side by side. FINDINGS  Left Ventricle: There is a mid cavitary gradient up to due to hyperdynamic function; There is no evidence of HCM. Left ventricular ejection fraction, by estimation, is >75%. Left ventricular ejection fraction by PLAX is 75 %. The left ventricle has hyperdynamic function. The left ventricle has no regional wall motion abnormalities. The left ventricular internal cavity size was normal in size. There is no left ventricular hypertrophy. Left ventricular diastolic parameters are consistent with Grade I diastolic dysfunction (impaired relaxation). Right Ventricle: The right ventricular size is normal. No increase in right ventricular wall thickness. Right ventricular systolic function is normal. Left Atrium: Left atrial size was normal in size. Right Atrium: Right atrial size was normal in size. Pericardium: There is no evidence of pericardial effusion. Presence of epicardial fat layer. Mitral Valve: The mitral valve is degenerative in appearance. Mild mitral annular calcification. Trivial mitral valve regurgitation. Tricuspid Valve: The tricuspid valve is normal in structure. Tricuspid valve regurgitation is mild. Aortic Valve: The aortic valve has an indeterminant number of cusps. Aortic valve regurgitation is trivial. Aortic valve mean gradient measures 6.0 mmHg. Aortic valve peak gradient measures 8.9 mmHg. Aortic valve area, by VTI measures 3.15 cm. Pulmonic Valve: The pulmonic valve was not well visualized. Pulmonic valve regurgitation is not visualized. Aorta: The ascending aorta was not well visualized and the aortic root is normal in size and structure. Venous: The inferior vena cava was not well visualized. IAS/Shunts: The interatrial septum was not  well visualized.  LEFT VENTRICLE PLAX 2D LV EF:         Left            Diastology                ventricular     LV e' medial:    4.90 cm/s                ejection        LV E/e' medial:  13.3                fraction by     LV e' lateral:   9.14 cm/s                PLAX is 75      LV E/e' lateral: 7.1                %. LVIDd:         4.40 cm LVIDs:         2.50 cm LV PW:         0.80 cm LV IVS:        0.90 cm LVOT diam:     1.90 cm LV SV:         77 LV SV Index:   39 LVOT Area:     2.84 cm  RIGHT VENTRICLE RV S prime:     20.10 cm/s TAPSE (M-mode): 1.7 cm LEFT ATRIUM             Index LA Vol (A2C):   27.6 ml 13.89 ml/m LA Vol (A4C):   68.8 ml 34.62 ml/m LA Biplane Vol: 47.1 ml 23.70 ml/m  AORTIC VALVE AV Area (Vmax):    2.26 cm AV Area (Vmean):   2.18 cm AV Area (VTI):     3.15 cm AV Vmax:           149.00 cm/s AV Vmean:          114.000 cm/s AV VTI:            0.243 m AV Peak Grad:      8.9 mmHg AV Mean Grad:      6.0 mmHg LVOT Vmax:         119.00 cm/s LVOT Vmean:        87.700 cm/s LVOT VTI:          0.270 m LVOT/AV VTI ratio: 1.11  AORTA Ao Root diam: 2.80 cm MITRAL VALVE               TRICUSPID VALVE MV Area (PHT): 2.03 cm    TR Peak grad:   23.4 mmHg MV Decel Time: 373 msec    TR Vmax:        242.00 cm/s MV E velocity: 65.30 cm/s MV A velocity: 86.20 cm/s  SHUNTS MV E/A ratio:  0.76        Systemic VTI:  0.27 m                            Systemic Diam: 1.90 cm Riley Lam MD Electronically signed by Riley Lam MD Signature Date/Time: 08/12/2023/11:14:34 AM    Final    CT Lumbar Spine Wo Contrast Result Date: 08/02/2023 CLINICAL DATA:  82 year old male status post syncope. Dizziness. Vomiting. Hyperglycemia. EXAM: CT LUMBAR SPINE WITHOUT CONTRAST TECHNIQUE: Multidetector CT imaging of the lumbar spine was performed without intravenous contrast administration. Multiplanar CT  image reconstructions were also generated. RADIATION DOSE REDUCTION: This exam was performed according to  the departmental dose-optimization program which includes automated exposure control, adjustment of the mA and/or kV according to patient size and/or use of iterative reconstruction technique. COMPARISON:  Lumbar CT myelogram 04/07/2023. FINDINGS: Segmentation: Normal. Alignment: Stable lumbar lordosis since August. Mild chronic retrolisthesis L2 on L3. Mild chronic anterolisthesis L5 on S1. Vertebrae: Background bone mineralization is within normal limits for age. Stable visualized osseous structures. Intact visible sacrum and SI joints. Chronic postoperative details are below. Asymmetric anterior right SI joint degeneration. Paraspinal and other soft tissues: Chronic left lower thorax posterior subcutaneous spinal stimulator type generator device. Partially visible electrical lead there is stable. Aortoiliac calcified atherosclerosis. Stable visible noncontrast abdominal viscera. Partially visible gastric hiatal hernia. Chronic postoperative changes to the lower lumbar paraspinal soft tissues are stable. Disc levels: Widespread lumbar spine degeneration appears stable since the August CT myelogram (please see that report). Chronic decompression and fusion at L5-S1 with solid arthrodesis, no hardware loosening. IMPRESSION: 1. No acute osseous abnormality in the Lumbar Spine. 2. Chronic L5-S1 decompression and fusion with no adverse features. Other chronic lumbar spine degeneration appears stable from CT myelogram August CT this year. 3. Left side thoracic spinal stimulator partially visible. Aortic Atherosclerosis (ICD10-I70.0). Gastric hiatal hernia. Electronically Signed   By: Odessa Fleming M.D.   On: 08/02/2023 08:40   CT Head Wo Contrast Result Date: 08/02/2023 CLINICAL DATA:  82 year old male status post syncope. Dizziness. Vomiting. Hyperglycemia. EXAM: CT HEAD WITHOUT CONTRAST TECHNIQUE: Contiguous axial images were obtained from the base of the skull through the vertex without intravenous contrast. RADIATION  DOSE REDUCTION: This exam was performed according to the departmental dose-optimization program which includes automated exposure control, adjustment of the mA and/or kV according to patient size and/or use of iterative reconstruction technique. COMPARISON:  Head CT 04/22/2021. FINDINGS: Brain: Chronic right superior frontal gyrus encephalomalacia suspicious for previous distal right ACA territory infarct, stable. Stable cerebral volume. No midline shift, ventriculomegaly, mass effect, evidence of mass lesion, intracranial hemorrhage or evidence of cortically based acute infarction. Stable gray-white matter differentiation throughout the brain. Vascular: No suspicious intracranial vascular hyperdensity. Calcified atherosclerosis at the skull base. Skull: Chronic left lamina papyracea fracture. No acute osseous abnormality identified. Sinuses/Orbits: Chronic opacification of the left tympanic cavity and mastoids is stable and has been present since at least 2019. Other Visualized paranasal sinuses and mastoids are stable and well aerated. Other: No acute orbit or scalp soft tissue finding. IMPRESSION: No acute intracranial abnormality. Stable non contrast CT appearance of the brain, chronic right ACA territory infarct. Electronically Signed   By: Odessa Fleming M.D.   On: 08/02/2023 08:36    Microbiology: Results for orders placed or performed during the hospital encounter of 02/07/21  Resp Panel by RT-PCR (Flu A&B, Covid) Nasopharyngeal Swab     Status: None   Collection Time: 02/08/21  2:45 PM   Specimen: Nasopharyngeal Swab; Nasopharyngeal(NP) swabs in vial transport medium  Result Value Ref Range Status   SARS Coronavirus 2 by RT PCR NEGATIVE NEGATIVE Final    Comment: (NOTE) SARS-CoV-2 target nucleic acids are NOT DETECTED.  The SARS-CoV-2 RNA is generally detectable in upper respiratory specimens during the acute phase of infection. The lowest concentration of SARS-CoV-2 viral copies this assay can  detect is 138 copies/mL. A negative result does not preclude SARS-Cov-2 infection and should not be used as the sole basis for treatment or other patient management decisions. A negative result  may occur with  improper specimen collection/handling, submission of specimen other than nasopharyngeal swab, presence of viral mutation(s) within the areas targeted by this assay, and inadequate number of viral copies(<138 copies/mL). A negative result must be combined with clinical observations, patient history, and epidemiological information. The expected result is Negative.  Fact Sheet for Patients:  BloggerCourse.com  Fact Sheet for Healthcare Providers:  SeriousBroker.it  This test is no t yet approved or cleared by the [place] FDA and  has been authorized for detection and/or diagnosis of SARS-CoV-2 by FDA under an Emergency Use Authorization (EUA). This EUA will remain  in effect (meaning this test can be used) for the duration of the COVID-19 declaration under Section 564(b)(1) of the Act, 21 U.S.C.section 360bbb-3(b)(1), unless the authorization is terminated  or revoked sooner.       Influenza A by PCR NEGATIVE NEGATIVE Final   Influenza B by PCR NEGATIVE NEGATIVE Final    Comment: (NOTE) The Xpert Xpress SARS-CoV-2/FLU/RSV plus assay is intended as an aid in the diagnosis of influenza from Nasopharyngeal swab specimens and should not be used as a sole basis for treatment. Nasal washings and aspirates are unacceptable for Xpert Xpress SARS-CoV-2/FLU/RSV testing.  Fact Sheet for Patients: BloggerCourse.com  Fact Sheet for Healthcare Providers: SeriousBroker.it  This test is not yet approved or cleared by the [place] FDA and has been authorized for detection and/or diagnosis of SARS-CoV-2 by FDA under an Emergency Use Authorization (EUA). This EUA will remain in  effect (meaning this test can be used) for the duration of the COVID-19 declaration under Section 564(b)(1) of the Act, 21 U.S.C. section 360bbb-3(b)(1), unless the authorization is terminated or revoked.  Performed at [facility], [address], [place], [place] [address]     Labs: CBC: Recent Labs  Lab 08/11/23 1549 08/12/23 0232  WBC 5.0 4.9  HGB 12.1* 10.2*  HCT 39.2 32.4*  MCV 88.5 86.9  PLT 214 189   Basic Metabolic Panel: Recent Labs  Lab 08/11/23 1549 08/12/23 0232  NA 137 140  K 3.7 3.3*  CL 105 107  CO2 23 25  GLUCOSE 190* 139*  BUN 14 15  CREATININE 1.41* 1.13  CALCIUM 9.4 8.6*   Liver Function Tests: No results for input(s): "AST", "ALT", "ALKPHOS", "BILITOT", "PROT", "ALBUMIN" in the last 168 hours. CBG: Recent Labs  Lab 08/11/23 1657 08/11/23 2256 08/12/23 0606 08/12/23 1104  GLUCAP 117* 96 91 107*    Discharge time spent: approximately 35 minutes spent on discharge counseling, evaluation of patient on day of discharge, and coordination of discharge planning with nursing, social work, pharmacy and case management  Signed: Alberteen Sam, MD [facility] 08/12/2023

## 2023-08-12 NOTE — Progress Notes (Signed)
Ordered 30 day Event Monitor for further evaluation of syncope at the request of Dr. Maryfrances Bunnell (Internal Medicine).   Corrin Parker, PA-C 08/12/2023 12:13 PM

## 2023-08-12 NOTE — Assessment & Plan Note (Signed)
Was waiting for his son in the grocery store parking lot, got dizzy and hot, then passed out.  Did this again while seated in the ER waiting area.  In the ER, K 3.3, ECG showed stable RBBB.  Had similar episode in April this year, due to dehydration/furosemide.

## 2023-08-12 NOTE — TOC Transition Note (Signed)
Transition of Care [facility]) - Discharge Note   Patient Details  Name: Luis Poole MRN: [id number] Date of Birth: 1941-01-18  Transition of [facility]) CM/SW Contact:  Gordy Clement, RN Phone Number: 08/12/2023, 12:27 PM   Clinical Narrative:    Patient will DC to home today Home Health PT has been recommended and will be provided by [facility]. No DME has been recommended  AVS updated. Family to transport           Patient Goals and CMS Choice            Discharge Placement                       Discharge Plan and Services Additional resources added to the After Visit Summary for                                       Social Drivers of Health (SDOH) Interventions SDOH Screenings   Food Insecurity: No Food Insecurity (08/12/2023)  Housing: Low Risk  (08/12/2023)  Transportation Needs: Unmet Transportation Needs (08/12/2023)  Utilities: Not At Risk (08/12/2023)  Financial Resource Strain: Low Risk  (06/26/2023)   Received from [facility]  Physical Activity: Unknown (06/26/2023)   Received from [facility]  Social Connections: Socially Integrated (06/26/2023)   Received from [facility]  Stress: Stress Concern Present (06/26/2023)   Received from [facility]  Tobacco Use: Medium Risk (08/11/2023)     Readmission Risk Interventions     No data to display

## 2023-08-12 NOTE — Plan of Care (Signed)
  Problem: Education: Goal: Ability to describe self-care measures that may prevent or decrease complications (Diabetes Survival Skills Education) will improve Outcome: Completed/Met Goal: Individualized Educational Video(s) Outcome: Completed/Met   Problem: Coping: Goal: Ability to adjust to condition or change in health will improve Outcome: Completed/Met   Problem: Fluid Volume: Goal: Ability to maintain a balanced intake and output will improve Outcome: Completed/Met   Problem: Health Behavior/Discharge Planning: Goal: Ability to identify and utilize available resources and services will improve Outcome: Completed/Met Goal: Ability to manage health-related needs will improve Outcome: Completed/Met   Problem: Metabolic: Goal: Ability to maintain appropriate glucose levels will improve Outcome: Completed/Met   Problem: Nutritional: Goal: Maintenance of adequate nutrition will improve Outcome: Completed/Met Goal: Progress toward achieving an optimal weight will improve Outcome: Completed/Met   Problem: Skin Integrity: Goal: Risk for impaired skin integrity will decrease Outcome: Completed/Met   Problem: Tissue Perfusion: Goal: Adequacy of tissue perfusion will improve Outcome: Completed/Met   Problem: Education: Goal: Knowledge of condition and prescribed therapy will improve Outcome: Completed/Met   Problem: Cardiac: Goal: Will achieve and/or maintain adequate cardiac output Outcome: Completed/Met   Problem: Physical Regulation: Goal: Complications related to the disease process, condition or treatment will be avoided or minimized Outcome: Completed/Met   Problem: Education: Goal: Knowledge of General Education information will improve Description: Including pain rating scale, medication(s)/side effects and non-pharmacologic comfort measures Outcome: Completed/Met   Problem: Health Behavior/Discharge Planning: Goal: Ability to manage health-related needs will  improve Outcome: Completed/Met   Problem: Clinical Measurements: Goal: Ability to maintain clinical measurements within normal limits will improve Outcome: Completed/Met Goal: Will remain free from infection Outcome: Completed/Met Goal: Diagnostic test results will improve Outcome: Completed/Met Goal: Respiratory complications will improve Outcome: Completed/Met Goal: Cardiovascular complication will be avoided Outcome: Completed/Met   Problem: Activity: Goal: Risk for activity intolerance will decrease Outcome: Completed/Met   Problem: Nutrition: Goal: Adequate nutrition will be maintained Outcome: Completed/Met   Problem: Coping: Goal: Level of anxiety will decrease Outcome: Completed/Met   Problem: Elimination: Goal: Will not experience complications related to bowel motility Outcome: Completed/Met Goal: Will not experience complications related to urinary retention Outcome: Completed/Met   Problem: Pain Management: Goal: General experience of comfort will improve Outcome: Completed/Met   Problem: Safety: Goal: Ability to remain free from injury will improve Outcome: Completed/Met   Problem: Skin Integrity: Goal: Risk for impaired skin integrity will decrease Outcome: Completed/Met

## 2023-08-12 NOTE — Evaluation (Signed)
Physical Therapy Evaluation Patient Details Name: Luis Poole MRN: [id number] DOB: 06/25/1941 Today's Date: 08/12/2023  History of Present Illness  Patient is 82 y.o. male presented to ED for syncope. PMH  significant for chronic HFpEF, history of CVA, diet-controlled T2DM, HTN, HLD, lumbar spinal stenosis with chronic pain s/p spinal cord stimulator.   Clinical Impression  Luis Poole is 82 y.o. male admitted with above HPI and diagnosis. Patient is currently limited by functional impairments below (see PT problem list). Patient lives with son and is mod ind with RW and WC at baseline. Currently he requires min assist for bed mob, and close CGA for safety to transfer and amb short distance with RW. Pt able to don shoes and Lt AFO with min assist to facilitate time and manage fatigue. Patient will benefit from continued skilled PT interventions to address impairments and progress independence with mobility, recommending HHPT. Acute PT will follow and progress as able.     Orthostatic VS for the past 24 hrs:  BP- Lying Pulse- Lying BP- Sitting Pulse- Sitting BP- Standing at 0 minutes Pulse- Standing at 0 minutes   BP- Standing at 3 minutes Pulse- Standing at 3 minutes    08/12/23 0835 122/68 66 128/73 69 117/71 81 137/71 82          If plan is discharge home, recommend the following: A little help with walking and/or transfers;A little help with bathing/dressing/bathroom;Assistance with cooking/housework;Assist for transportation;Help with stairs or ramp for entrance   Can travel by private vehicle        Equipment Recommendations None recommended by PT  Recommendations for Other Services       Functional Status Assessment Patient has had a recent decline in their functional status and demonstrates the ability to make significant improvements in function in a reasonable and predictable amount of time.     Precautions / Restrictions Precautions Precautions:  Fall Restrictions Weight Bearing Restrictions Per Provider Order: No      Mobility  Bed Mobility Overal bed mobility: Needs Assistance Bed Mobility: Supine to Sit     Supine to sit: Min assist, HOB elevated, Used rails     General bed mobility comments: pt reliant on bed features and min assist to fully scoot to EOB.    Transfers Overall transfer level: Needs assistance   Transfers: Sit to/from Stand, Bed to chair/wheelchair/BSC Sit to Stand: Min assist, Contact guard assist   Step pivot transfers: Contact guard assist       General transfer comment: Min assist for initial stand from EOB, CGA for rise from recliner close CGA for safety with pivot bed>chair, pt required cues to step Rt LE longer distance to allow space for Lt LE to advance.    Ambulation/Gait Ambulation/Gait assistance: Contact guard assist Gait Distance (Feet): 25 Feet Assistive device: Rolling walker (2 wheels) Gait Pattern/deviations: Decreased step length - left, Decreased stride length, Step-to pattern, Decreased dorsiflexion - left, Decreased step length - right, Trunk flexed Gait velocity: decr     General Gait Details: pt with flexed posture, cues to faciltiate upright but pt unable to acheive fully upright standing. Pt advancing Lt LE first then Rt, no Lt LE dragging. Close CGA for safety and chair follow.  Stairs            Wheelchair Mobility     Tilt Bed    Modified Rankin (Stroke Patients Only)       Balance Overall balance assessment: Needs assistance Sitting-balance support: No  upper extremity supported, Bilateral upper extremity supported, Feet supported Sitting balance-Leahy Scale: Fair     Standing balance support: Bilateral upper extremity supported, During functional activity, Reliant on assistive device for balance Standing balance-Leahy Scale: Poor                               Pertinent Vitals/Pain Pain Assessment Pain Assessment: No/denies pain     Home Living Family/patient expects to be discharged to:: Private residence Living Arrangements: Children Available Help at Discharge: Family;Available PRN/intermittently Type of Home: House Home Access: Ramped entrance       Home Layout: One level Home Equipment: Rollator (4 wheels);Shower seat - built in;Grab bars - tub/shower;Wheelchair - manual;Toilet riser;Grab bars - toilet;Rolling Environmental consultant (2 wheels);Hospital bed Additional Comments: reports need new mattress for hospital bed    Prior Function Prior Level of Function : Independent/Modified Independent             Mobility Comments: uses RW or rollator in home (rollator Lt brake broken) WC inside of house and sometimes out of house, has a 2 WRW as well that he will use. ADLs Comments: reports cooking, IADL's, uses adult absorbent undergarments     Extremity/Trunk Assessment   Upper Extremity Assessment Upper Extremity Assessment: Overall WFL for tasks assessed    Lower Extremity Assessment Lower Extremity Assessment: Defer to PT evaluation RLE Deficits / Details: grossly 3+/5 throughout LLE Deficits / Details: Lt LE weakness, no DF, AFO for mobility. LLE Coordination: decreased gross motor;decreased fine motor    Cervical / Trunk Assessment Cervical / Trunk Assessment: Kyphotic  Communication   Communication Communication: No apparent difficulties  Cognition Arousal: Alert Behavior During Therapy: WFL for tasks assessed/performed Overall Cognitive Status: Within Functional Limits for tasks assessed                                          General Comments      Exercises     Assessment/Plan    PT Assessment Patient needs continued PT services  PT Problem List         PT Treatment Interventions DME instruction;Gait training;Stair training;Functional mobility training;Therapeutic activities;Therapeutic exercise;Balance training;Patient/family education    PT Goals (Current goals can  be found in the Care Plan section)  Acute Rehab PT Goals Patient Stated Goal: recover and get home PT Goal Formulation: With patient Time For Goal Achievement: 08/26/23 Potential to Achieve Goals: Good    Frequency Min 1X/week     Co-evaluation               AM-PAC PT "6 Clicks" Mobility  Outcome Measure Help needed turning from your back to your side while in a flat bed without using bedrails?: A Little Help needed moving from lying on your back to sitting on the side of a flat bed without using bedrails?: A Little Help needed moving to and from a bed to a chair (including a wheelchair)?: A Little Help needed standing up from a chair using your arms (e.g., wheelchair or bedside chair)?: A Little Help needed to walk in hospital room?: A Little Help needed climbing 3-5 steps with a railing? : A Lot 6 Click Score: 17    End of Session Equipment Utilized During Treatment: Gait belt Activity Tolerance: Patient tolerated treatment well Patient left: in chair;with call bell/phone within reach;with chair  alarm set Nurse Communication: Mobility status PT Visit Diagnosis: Other abnormalities of gait and mobility (R26.89);Muscle weakness (generalized) (M62.81);Difficulty in walking, not elsewhere classified (R26.2);Unsteadiness on feet (R26.81)    Time: 0829-0900 PT Time Calculation (min) (ACUTE ONLY): 31 min   Charges:   PT Evaluation $PT Eval Moderate Complexity: 1 Mod PT Treatments $Gait Training: 8-22 mins PT General Charges $$ ACUTE PT VISIT: 1 Visit         Wynn Maudlin, DPT Acute Rehabilitation Services Office [phone]  08/12/23 11:42 AM

## 2023-08-12 NOTE — Progress Notes (Signed)
Discharge instructions given. Patient verbalized understanding and all questions were answered.  ?

## 2023-08-12 NOTE — ED Notes (Addendum)
ED TO INPATIENT HANDOFF REPORT  ED Nurse Name and Phone #: Francene Castle [phone]  S Name/Age/Gender Luis Poole 82 y.o. male Room/Bed: 045C/045C  Code Status   Code Status: Full Code  Home/SNF/Other Home Patient oriented to: self, place, time, and situation Is this baseline? Yes   Triage Complete: Triage complete  Chief Complaint Syncope [R55]  Triage Note Pt brought in by his nephew who reports he came out of the store to find the patient unresponsive and slumped over in the car at 1518. The nephew states he drove straight here and the pt became responsive on the way here. Pt reports he remembers feeling dizzy beforehand. He is currently A&Ox4 and still feels dizzy. Skin warm and dry, speaking clear complete sentences. He reports left leg numbness and back pain that started this morning at 5am. Similar episode happened last week and he was seen at [facility] ER.    Allergies Allergies  Allergen Reactions   Ibuprofen Itching   Lisinopril Anaphylaxis   Pregabalin Swelling    Recently filled 11/24 for 90ds and reports taking on 08/11/23   Gabapentin Other (See Comments)    Dizziness    Level of Care/Admitting Diagnosis ED Disposition     ED Disposition  Admit   Condition  --   Comment  Hospital Area: [facility] [[id number]]  Level of Care: Telemetry Cardiac [103]  May place patient in observation at [facility] or [facility] if equivalent level of care is available:: No  Covid Evaluation: Asymptomatic - no recent exposure (last 10 days) testing not required  Diagnosis: Syncope [206001]  Admitting Physician: Charlsie Quest [[id number]]  Attending Physician: Charlsie Quest [[id number]]          B Medical/Surgery History Past Medical History:  Diagnosis Date   Anemia, unspecified 06/08/2013   Cataract    CHF (congestive heart failure) (HCC)    Chronic kidney disease    Diabetes mellitus    Foot drop, left foot    AFO   Hypertension    PAD  (peripheral artery disease) (HCC)    S/P lumbar fusion    Sequelae of cerebral infarction    Stroke [facility])    Past Surgical History:  Procedure Laterality Date   CATARACT EXTRACTION     EYE SURGERY     KNEE SURGERY  2006   NECK SURGERY  2012   SPINE SURGERY  2009   TONSILECTOMY, ADENOIDECTOMY, BILATERAL MYRINGOTOMY AND TUBES       A IV Location/Drains/Wounds Patient Lines/Drains/Airways Status     Active Line/Drains/Airways     Name Placement date Placement time Site Days   Peripheral IV 08/11/23 18 G Right Forearm 08/11/23  1659  Forearm  1            Intake/Output Last 24 hours No intake or output data in the 24 hours ending 08/12/23 0037  Labs/Imaging Results for orders placed or performed during the hospital encounter of 08/11/23 (from the past 48 hours)  Basic metabolic panel     Status: Abnormal   Collection Time: 08/11/23  3:49 PM  Result Value Ref Range   Sodium 137 135 - 145 mmol/L   Potassium 3.7 3.5 - 5.1 mmol/L   Chloride 105 98 - 111 mmol/L   CO2 23 22 - 32 mmol/L   Glucose, Bld 190 (H) 70 - 99 mg/dL    Comment: Glucose reference range applies only to samples taken after fasting for at least 8  hours.   BUN 14 8 - 23 mg/dL   Creatinine, Ser 1.61 (H) 0.61 - 1.24 mg/dL   Calcium 9.4 8.9 - 09.6 mg/dL   GFR, Estimated 50 (L) >60 mL/min    Comment: (NOTE) Calculated using the CKD-EPI Creatinine Equation (2021)    Anion gap 9 5 - 15    Comment: Performed at [facility], [address], [place], [place] [address]  CBC     Status: Abnormal   Collection Time: 08/11/23  3:49 PM  Result Value Ref Range   WBC 5.0 4.0 - 10.5 K/uL   RBC 4.43 4.22 - 5.81 MIL/uL   Hemoglobin 12.1 (L) 13.0 - 17.0 g/dL   HCT 98.1 19.1 - 47.8 %   MCV 88.5 80.0 - 100.0 fL   MCH 27.3 26.0 - 34.0 pg   MCHC 30.9 30.0 - 36.0 g/dL   RDW 29.5 62.1 - 30.8 %   Platelets 214 150 - 400 K/uL   nRBC 0.0 0.0 - 0.2 %    Comment: Performed at [facility], [address],  [place], [place] [address]  CBG monitoring, ED     Status: Abnormal   Collection Time: 08/11/23  4:57 PM  Result Value Ref Range   Glucose-Capillary 117 (H) 70 - 99 mg/dL    Comment: Glucose reference range applies only to samples taken after fasting for at least 8 hours.  Troponin I (High Sensitivity)     Status: None   Collection Time: 08/11/23  5:15 PM  Result Value Ref Range   Troponin I (High Sensitivity) 8 <18 ng/L    Comment: (NOTE) Elevated high sensitivity troponin I (hsTnI) values and significant  changes across serial measurements may suggest ACS but many other  chronic and acute conditions are known to elevate hsTnI results.  Refer to the "Links" section for chest pain algorithms and additional  guidance. Performed at [facility], [address], [place], [place] [address]   Troponin I (High Sensitivity)     Status: None   Collection Time: 08/11/23  7:10 PM  Result Value Ref Range   Troponin I (High Sensitivity) 8 <18 ng/L    Comment: (NOTE) Elevated high sensitivity troponin I (hsTnI) values and significant  changes across serial measurements may suggest ACS but many other  chronic and acute conditions are known to elevate hsTnI results.  Refer to the "Links" section for chest pain algorithms and additional  guidance. Performed at [facility], [address], [place], [place] [address]   CBG monitoring, ED     Status: None   Collection Time: 08/11/23 10:56 PM  Result Value Ref Range   Glucose-Capillary 96 70 - 99 mg/dL    Comment: Glucose reference range applies only to samples taken after fasting for at least 8 hours.   No results found.  Pending Labs Unresulted Labs (From admission, onward)     Start     Ordered   08/12/23 0500  CBC  Tomorrow morning,   R        08/11/23 2157   08/12/23 0500  Basic metabolic panel  Tomorrow morning,   R        08/11/23 2157   08/11/23 1549  Urinalysis, Routine w reflex microscopic -Urine, Clean Catch  Once,    URGENT       Question:  Specimen Source  Answer:  Urine, Clean Catch   08/11/23 1548  Vitals/Pain Today's Vitals   08/11/23 1548 08/11/23 1745 08/11/23 1936 08/11/23 2300  BP:  (!) 140/69 131/74 (!) 150/82  Pulse:  68 65 65  Resp:  18 10 14   Temp:   98.4 F (36.9 C)   TempSrc:   Oral   SpO2:  97% 100% 100%  Weight: 89.4 kg     Height: 5\' 7"  (1.702 m)     PainSc: 4        Isolation Precautions No active isolations  Medications Medications  sodium chloride flush (NS) 0.9 % injection 3 mL (has no administration in time range)  enoxaparin (LOVENOX) injection 40 mg (has no administration in time range)  acetaminophen (TYLENOL) tablet 650 mg (has no administration in time range)    Or  acetaminophen (TYLENOL) suppository 650 mg (has no administration in time range)  ondansetron (ZOFRAN) tablet 4 mg (has no administration in time range)    Or  ondansetron (ZOFRAN) injection 4 mg (has no administration in time range)  senna-docusate (Senokot-S) tablet 1 tablet (has no administration in time range)  insulin aspart (novoLOG) injection 0-9 Units (has no administration in time range)  insulin aspart (novoLOG) injection 0-5 Units ( Subcutaneous Not Given 08/11/23 2258)  aspirin EC tablet 81 mg (has no administration in time range)  allopurinol (ZYLOPRIM) tablet 100 mg (has no administration in time range)  atorvastatin (LIPITOR) tablet 20 mg (has no administration in time range)  pregabalin (LYRICA) capsule 100 mg (has no administration in time range)  sodium chloride 0.9 % bolus 500 mL (0 mLs Intravenous Stopped 08/11/23 1830)    Mobility walks     Focused Assessments    R Recommendations: See Admitting Provider Note  Report given to:   Additional Notes:  he walks but has to use a walker, otherwise wheelchair. Lives with his son.

## 2023-08-12 NOTE — Evaluation (Signed)
Occupational Therapy Evaluation Patient Details Name: Luis Poole MRN: [id number] DOB: [birth year] Today's Date: 08/12/2023   History of Present Illness Patient is 82 y.o. male presented to ED for syncope. PMH  significant for chronic HFpEF, history of CVA, diet-controlled T2DM, HTN, HLD, lumbar spinal stenosis with chronic pain s/p spinal cord stimulator.   Clinical Impression   Patient admitted for the diagnosis above.  PT checked orthostatic BP.  Patient not having any dizziness, and appears to be at his baseline.  No acute OT needs identified, and no post acute OT anticipated.  Patient has needed DME at home.       If plan is discharge home, recommend the following: Assist for transportation    Functional Status Assessment  Patient has not had a recent decline in their functional status  Equipment Recommendations  None recommended by OT    Recommendations for Other Services       Precautions / Restrictions Precautions Precautions: Fall Restrictions Weight Bearing Restrictions Per Provider Order: No      Mobility Bed Mobility               General bed mobility comments: up in recliner    Transfers Overall transfer level: Needs assistance   Transfers: Sit to/from Stand, Bed to chair/wheelchair/BSC Sit to Stand: Supervision     Step pivot transfers: Supervision     General transfer comment: L knee buckle, patient states it is his baseline.      Balance Overall balance assessment: Needs assistance         Standing balance support: Reliant on assistive device for balance Standing balance-Leahy Scale: Poor                             ADL either performed or assessed with clinical judgement   ADL Overall ADL's : At baseline                                             Vision Patient Visual Report: No change from baseline       Perception Perception: Not tested       Praxis Praxis: Not tested        Pertinent Vitals/Pain Pain Assessment Pain Assessment: No/denies pain     Extremity/Trunk Assessment Upper Extremity Assessment Upper Extremity Assessment: Overall WFL for tasks assessed   Lower Extremity Assessment Lower Extremity Assessment: Defer to PT evaluation   Cervical / Trunk Assessment Cervical / Trunk Assessment: Kyphotic   Communication Communication Communication: No apparent difficulties   Cognition Arousal: Alert Behavior During Therapy: WFL for tasks assessed/performed Overall Cognitive Status: Within Functional Limits for tasks assessed                                       General Comments   VSS on RA    Exercises     Shoulder Instructions      Home Living Family/patient expects to be discharged to:: Private residence Living Arrangements: Children Available Help at Discharge: Family;Available PRN/intermittently Type of Home: House Home Access: Ramped entrance     Home Layout: One level     Bathroom Shower/Tub: Producer, television/film/video: Standard Bathroom Accessibility: Yes   Home Equipment: Rollator (4 wheels);Shower seat -  built in;Grab bars - tub/shower;Wheelchair - manual;Toilet riser;Grab bars - toilet;Rolling Environmental consultant (2 wheels);Hospital bed   Additional Comments: reports need new mattress for hospital bed      Prior Functioning/Environment Prior Level of Function : Independent/Modified Independent             Mobility Comments: uses RW or rollator in home (rollator Lt brake broken) WC inside of house and sometimes out of house, has a 2 WRW as well that he will use. ADLs Comments: reports cooking, IADL's, uses adult absorbent undergarments        OT Problem List: Impaired balance (sitting and/or standing)      OT Treatment/Interventions:      OT Goals(Current goals can be found in the care plan section) Acute Rehab OT Goals Patient Stated Goal: Hopng to go home OT Goal Formulation: With  patient Time For Goal Achievement: 08/15/23 Potential to Achieve Goals: Good  OT Frequency:      Co-evaluation              AM-PAC OT "6 Clicks" Daily Activity     Outcome Measure Help from another person eating meals?: None Help from another person taking care of personal grooming?: None Help from another person toileting, which includes using toliet, bedpan, or urinal?: A Little Help from another person bathing (including washing, rinsing, drying)?: A Little Help from another person to put on and taking off regular upper body clothing?: None Help from another person to put on and taking off regular lower body clothing?: A Little 6 Click Score: 21   End of Session Equipment Utilized During Treatment: Rolling walker (2 wheels) Nurse Communication: Mobility status  Activity Tolerance: Patient tolerated treatment well Patient left: in chair;with chair alarm set;with call bell/phone within reach  OT Visit Diagnosis: Unsteadiness on feet (R26.81)                Time: 9147-8295 OT Time Calculation (min): 21 min Charges:  OT General Charges $OT Visit: 1 Visit OT Evaluation $OT Eval Moderate Complexity: 1 Mod  08/12/2023  RP, OTR/L  Acute Rehabilitation Services  Office:  [phone]   Suzanna Obey 08/12/2023, 9:43 AM

## 2023-08-14 ENCOUNTER — Encounter: Payer: Self-pay | Admitting: *Deleted

## 2023-08-14 NOTE — Progress Notes (Signed)
Patient enrolled for [employer] to ship a 30 day cardiac event monitor to his address on file. Letter with instructions mailed to patient.  Dr. Eden Emms to read.
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
## 2023-11-08 ENCOUNTER — Other Ambulatory Visit: Payer: Self-pay

## 2023-11-08 ENCOUNTER — Emergency Department (HOSPITAL_COMMUNITY)

## 2023-11-08 ENCOUNTER — Emergency Department (HOSPITAL_COMMUNITY)
Admission: EM | Admit: 2023-11-08 | Discharge: 2023-11-09 | Disposition: A | Discharge status: Home / Self Care | Attending: Emergency Medicine | Admitting: Emergency Medicine

## 2023-11-08 ENCOUNTER — Encounter (HOSPITAL_COMMUNITY): Payer: Self-pay | Admitting: Emergency Medicine

## 2023-11-08 DIAGNOSIS — N189 Chronic kidney disease, unspecified: Secondary | ICD-10-CM | POA: Insufficient documentation

## 2023-11-08 DIAGNOSIS — I130 Hypertensive heart and chronic kidney disease with heart failure and stage 1 through stage 4 chronic kidney disease, or unspecified chronic kidney disease: Secondary | ICD-10-CM | POA: Insufficient documentation

## 2023-11-08 DIAGNOSIS — M79662 Pain in left lower leg: Secondary | ICD-10-CM | POA: Insufficient documentation

## 2023-11-08 DIAGNOSIS — Z7982 Long term (current) use of aspirin: Secondary | ICD-10-CM | POA: Diagnosis not present

## 2023-11-08 DIAGNOSIS — M25552 Pain in left hip: Secondary | ICD-10-CM | POA: Insufficient documentation

## 2023-11-08 DIAGNOSIS — I509 Heart failure, unspecified: Secondary | ICD-10-CM | POA: Insufficient documentation

## 2023-11-08 DIAGNOSIS — W1839XA Other fall on same level, initial encounter: Secondary | ICD-10-CM | POA: Insufficient documentation

## 2023-11-08 DIAGNOSIS — Z8673 Personal history of transient ischemic attack (TIA), and cerebral infarction without residual deficits: Secondary | ICD-10-CM | POA: Diagnosis not present

## 2023-11-08 DIAGNOSIS — Z79899 Other long term (current) drug therapy: Secondary | ICD-10-CM | POA: Insufficient documentation

## 2023-11-08 DIAGNOSIS — W19XXXA Unspecified fall, initial encounter: Secondary | ICD-10-CM
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
## 2023-11-09 MED ORDER — LIDOCAINE 5 % EX PTCH
3.0000 | MEDICATED_PATCH | CUTANEOUS | Status: DC
  Administered 2023-11-09: 3 via TRANSDERMAL
  Filled 2023-11-09: qty 3

## 2023-11-09 MED ORDER — ACETAMINOPHEN 500 MG PO TABS
1000.0000 mg | ORAL_TABLET | Freq: Once | ORAL | Status: AC
  Administered 2023-11-09: 1000 mg via ORAL
  Filled 2023-11-09: qty 2
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
## 2023-11-09 NOTE — ED Notes (Signed)
 [facility] called for transport.

## 2023-11-15 ENCOUNTER — Emergency Department (HOSPITAL_COMMUNITY)
Admission: EM | Admit: 2023-11-15 | Discharge: 2023-11-15 | Disposition: A | Discharge status: Home / Self Care | Attending: Emergency Medicine | Admitting: Emergency Medicine

## 2023-11-15 ENCOUNTER — Emergency Department (HOSPITAL_COMMUNITY)

## 2023-11-15 DIAGNOSIS — Z8673 Personal history of transient ischemic attack (TIA), and cerebral infarction without residual deficits: Secondary | ICD-10-CM | POA: Diagnosis not present

## 2023-11-15 DIAGNOSIS — N183 Chronic kidney disease, stage 3 unspecified: Secondary | ICD-10-CM | POA: Diagnosis not present

## 2023-11-15 DIAGNOSIS — W1811XA Fall from or off toilet without subsequent striking against object, initial encounter: Secondary | ICD-10-CM | POA: Diagnosis not present

## 2023-11-15 DIAGNOSIS — M545 Low back pain, unspecified: Secondary | ICD-10-CM | POA: Insufficient documentation

## 2023-11-15 DIAGNOSIS — I2510 Atherosclerotic heart disease of native coronary artery without angina pectoris: Secondary | ICD-10-CM | POA: Insufficient documentation

## 2023-11-15 DIAGNOSIS — Z7982 Long term (current) use of aspirin: Secondary | ICD-10-CM | POA: Diagnosis not present

## 2023-11-15 DIAGNOSIS — E1122 Type 2 diabetes mellitus with diabetic chronic kidney disease: Secondary | ICD-10-CM | POA: Diagnosis not present

## 2023-11-15 DIAGNOSIS — W19XXXA Unspecified fall, initial encounter: Secondary | ICD-10-CM

## 2023-11-15 MED ORDER — OXYCODONE HCL 5 MG PO TABS
5.0000 mg | ORAL_TABLET | Freq: Once | ORAL | Status: AC
  Administered 2023-11-15: 5 mg via ORAL
  Filled 2023-11-15: qty 1

## 2023-11-15 MED ORDER — ACETAMINOPHEN 325 MG PO TABS
975.0000 mg | ORAL_TABLET | Freq: Once | ORAL | Status: AC
  Administered 2023-11-15: 975 mg via ORAL
  Filled 2023-11-15: qty 3
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
## 2023-11-15 NOTE — Discharge Instructions (Signed)
 Thank you relax evaluate you today.  Your back x-rays were negative for fracture.  Your tailbone does not appear fractured either.  Please follow-up with your PCP regarding home health as I believe you need help at home with your activities of daily living

## 2023-11-15 NOTE — ED Notes (Signed)
 Daughter called to transport pt, pt discharged to lobby in stable condition.

## 2023-11-15 NOTE — ED Triage Notes (Signed)
 Pt arrives via [facility] from home for a fall when trying to get off the commode in the bathroom. Pt c/o back pain radiating down his leg and pain in both legs.

## 2023-11-22 ENCOUNTER — Emergency Department (HOSPITAL_COMMUNITY)
Admission: EM | Admit: 2023-11-22 | Discharge: 2023-11-23 | Disposition: A | Discharge status: Home / Self Care | Attending: Emergency Medicine | Admitting: Emergency Medicine

## 2023-11-22 ENCOUNTER — Encounter (HOSPITAL_COMMUNITY): Payer: Self-pay

## 2023-11-22 ENCOUNTER — Emergency Department (HOSPITAL_COMMUNITY)

## 2023-11-22 ENCOUNTER — Other Ambulatory Visit: Payer: Self-pay

## 2023-11-22 DIAGNOSIS — R42 Dizziness and giddiness: Secondary | ICD-10-CM | POA: Diagnosis present

## 2023-11-22 DIAGNOSIS — N3000 Acute cystitis without hematuria: Secondary | ICD-10-CM | POA: Diagnosis not present

## 2023-11-22 LAB — CBC WITH DIFFERENTIAL/PLATELET
Abs Immature Granulocytes: 0.01 10*3/uL (ref 0.00–0.07)
Basophils Absolute: 0 10*3/uL (ref 0.0–0.1)
Basophils Relative: 1 %
Eosinophils Absolute: 0.1 10*3/uL (ref 0.0–0.5)
Eosinophils Relative: 1 %
HCT: 33.3 % — ABNORMAL LOW (ref 39.0–52.0)
Hemoglobin: 10.8 g/dL — ABNORMAL LOW (ref 13.0–17.0)
Immature Granulocytes: 0 %
Lymphocytes Relative: 44 %
Lymphs Abs: 2.4 10*3/uL (ref 0.7–4.0)
MCH: 27.8 pg (ref 26.0–34.0)
MCHC: 32.4 g/dL (ref 30.0–36.0)
MCV: 85.8 fL (ref 80.0–100.0)
Monocytes Absolute: 0.5 10*3/uL (ref 0.1–1.0)
Monocytes Relative: 9 %
Neutro Abs: 2.4 10*3/uL (ref 1.7–7.7)
Neutrophils Relative %: 45 %
Platelets: 200 10*3/uL (ref 150–400)
RBC: 3.88 MIL/uL — ABNORMAL LOW (ref 4.22–5.81)
RDW: 14.4 % (ref 11.5–15.5)
WBC: 5.3 10*3/uL (ref 4.0–10.5)
nRBC: 0 % (ref 0.0–0.2)

## 2023-11-22 LAB — COMPREHENSIVE METABOLIC PANEL WITH GFR
ALT: 16 U/L (ref 0–44)
AST: 25 U/L (ref 15–41)
Albumin: 3.5 g/dL (ref 3.5–5.0)
Alkaline Phosphatase: 70 U/L (ref 38–126)
Anion gap: 9 (ref 5–15)
BUN: 13 mg/dL (ref 8–23)
CO2: 23 mmol/L (ref 22–32)
Calcium: 8.8 mg/dL — ABNORMAL LOW (ref 8.9–10.3)
Chloride: 105 mmol/L (ref 98–111)
Creatinine, Ser: 1.16 mg/dL (ref 0.61–1.24)
GFR, Estimated: >60 mL/min (ref >60)
Glucose, Bld: 104 mg/dL — ABNORMAL HIGH (ref 70–99)
Potassium: 3.6 mmol/L (ref 3.5–5.1)
Sodium: 137 mmol/L (ref 135–145)
Total Bilirubin: 0.7 mg/dL (ref 0.0–1.2)
Total Protein: 7.6 g/dL (ref 6.5–8.1)

## 2023-11-22 LAB — TROPONIN I (HIGH SENSITIVITY): Troponin I (High Sensitivity): 8 ng/L (ref <18)

## 2023-11-22 MED ORDER — AMLODIPINE BESYLATE 5 MG PO TABS
5.0000 mg | ORAL_TABLET | Freq: Once | ORAL | Status: AC
  Administered 2023-11-22: 5 mg via ORAL
  Filled 2023-11-22: qty 1

## 2023-11-22 MED ORDER — MECLIZINE HCL 25 MG PO TABS
25.0000 mg | ORAL_TABLET | Freq: Once | ORAL | Status: AC
  Administered 2023-11-22: 25 mg via ORAL
  Filled 2023-11-22: qty 1

## 2023-11-22 MED ORDER — OXYCODONE-ACETAMINOPHEN 5-325 MG PO TABS
1.0000 | ORAL_TABLET | Freq: Once | ORAL | Status: AC
  Administered 2023-11-22: 1 via ORAL
  Filled 2023-11-22: qty 1

## 2023-11-22 MED ORDER — SODIUM CHLORIDE 0.9 % IV BOLUS
500.0000 mL | Freq: Once | INTRAVENOUS | Status: AC
  Administered 2023-11-22: 500 mL via INTRAVENOUS

## 2023-11-22 NOTE — ED Provider Notes (Signed)
 [place] EMERGENCY DEPARTMENT AT [facility] Provider Note   CSN: [id number] Arrival date & time: 11/22/23  2115     History {Add pertinent medical, surgical, social history, OB history to HPI:1} Chief Complaint  Patient presents with   Dizziness    Luis Poole is a 83 y.o. male with a history of CVA with left hemiparesis, stage III CKD, and heart failure who presents the ED today via EMS for dizziness.  Patient states that he went outside around 10 AM this morning, because his son and girlfriend were arguing in the house.  States that he was feeling some palpitations when they started arguing, which is why he went outside.  States he remained outside in his wheelchair from 10 AM to 7 PM.  When he tried to get up he felt dizzy.  Usually uses a wheelchair or walker to get around at baseline.  Denies falls or loss of consciousness.  Additionally, he reports chronic low back pain with pain that radiates down the left leg.  Has not taken any of his home medications today because he did not want to go back inside where his son and girlfriend were arguing. Endorses an episode of shortness of breath while outside today without chest pain. Had one episode of diarrhea yesterday but denies nausea, vomiting, or fevers.    Home Medications Prior to Admission medications   Medication Sig Start Date End Date Taking? Authorizing Provider  allopurinol (ZYLOPRIM) 100 MG tablet Take 100 mg by mouth daily.    [provider]  amLODipine (NORVASC) 10 MG tablet Take 0.5 tablets (5 mg total) by mouth daily. 08/12/23   Danford, Earl Lites, MD  aspirin EC 81 MG tablet Take 81 mg by mouth daily.    [provider]  atorvastatin (LIPITOR) 20 MG tablet Take 1 tablet (20 mg total) by mouth daily. 10/09/17   Burnadette Pop, MD  colchicine 0.6 MG tablet Take 1 tablet (0.6 mg total) daily by mouth. Patient taking differently: Take 0.6 mg by mouth daily as needed (gout pain). 06/29/17    Maxie Barb, MD  cyclobenzaprine (FLEXERIL) 10 MG tablet Take 1 tablet (10 mg total) by mouth 2 (two) times daily as needed for muscle spasms. 12/28/22   Jeanelle Malling, PA  ferrous sulfate 325 (65 FE) MG tablet Take 325 mg by mouth daily with breakfast.    [provider]  furosemide (LASIX) 20 MG tablet Take 1 tablet (20 mg total) by mouth daily. 08/12/23   Danford, Earl Lites, MD  meclizine (ANTIVERT) 12.5 MG tablet Take 1 tablet (12.5 mg total) by mouth 3 (three) times daily as needed for dizziness. 08/02/23   Durwin Glaze, MD  polyethylene glycol (MIRALAX / GLYCOLAX) 17 g packet Take 17 g by mouth daily as needed for mild constipation. 11/29/22   Calvert Cantor, MD  pregabalin (LYRICA) 100 MG capsule Take 100 mg by mouth 2 (two) times daily.    [provider]  valsartan (DIOVAN) 160 MG tablet Take 1 tablet (160 mg total) by mouth daily. 04/10/23   Sharlene Dory, PA-C      Allergies    Ibuprofen, Lisinopril, Pregabalin, and Gabapentin    Review of Systems   Review of Systems  Neurological:  Positive for dizziness.  All other systems reviewed and are negative.   Physical Exam Updated Vital Signs BP (!) 183/168   Pulse 69   Temp 99 F (37.2 C) (Oral)   Resp 20  SpO2 98%  Physical Exam Vitals and nursing note reviewed.  Constitutional:      General: He is not in acute distress.    Appearance: Normal appearance.  HENT:     Head: Normocephalic and atraumatic.     Mouth/Throat:     Mouth: Mucous membranes are moist.  Eyes:     Conjunctiva/sclera: Conjunctivae normal.     Pupils: Pupils are equal, round, and reactive to light.  Cardiovascular:     Rate and Rhythm: Normal rate and regular rhythm.     Pulses: Normal pulses.  Pulmonary:     Effort: Pulmonary effort is normal.     Breath sounds: Normal breath sounds.  Abdominal:     Palpations: Abdomen is soft.     Tenderness: There is no abdominal tenderness.  Musculoskeletal:        General:  Tenderness present. Normal range of motion.     Cervical back: Normal range of motion.     Comments: Tenderness to lumbar spine  Skin:    General: Skin is warm and dry.     Findings: No rash.  Neurological:     General: No focal deficit present.     Mental Status: He is alert.     Sensory: No sensory deficit.     Motor: No weakness.  Psychiatric:        Mood and Affect: Mood normal.        Behavior: Behavior normal.    Orthostatic Vital Signs Lying 136/75, pulse 75 Sitting 135/89, pulse 84 Standing 150/86, pulse 87  ED Results / Procedures / Treatments   Labs (all labs ordered are listed, but only abnormal results are displayed) Labs Reviewed  COMPREHENSIVE METABOLIC PANEL WITH GFR  CBC WITH DIFFERENTIAL/PLATELET  URINALYSIS, ROUTINE W REFLEX MICROSCOPIC  TROPONIN I (HIGH SENSITIVITY)    EKG None  Radiology No results found.  Procedures Procedures: not indicated. {Document cardiac monitor, telemetry assessment procedure when appropriate:1}  Medications Ordered in ED Medications - No data to display  ED Course/ Medical Decision Making/ A&P   {   Click here for ABCD2, HEART and other calculatorsREFRESH Note before signing :1}                              Medical Decision Making Amount and/or Complexity of Data Reviewed Labs: ordered. Radiology: ordered.  Risk Prescription drug management.   This patient presents to the ED for concern of dizziness, this involves an extensive number of treatment options, and is a complaint that carries with it a high risk of complications and morbidity.   Differential diagnosis includes: ***   Comorbidities  See HPI above   Additional History  Additional history obtained from prior ED visits   Cardiac Monitoring / EKG  The patient was maintained on a cardiac monitor.  I personally viewed and interpreted the cardiac monitored which showed: sinus rhythm with RBBB, heart rate of 77 bpm.   Lab Tests  I ordered  and personally interpreted labs.  The pertinent results include:  ***   Imaging Studies  I ordered imaging studies including ***  I independently visualized and interpreted imaging which showed: *** I agree with the radiologist interpretation   Consultations  I requested consultation with ***,  and discussed lab and imaging findings as well as pertinent plan - they recommend: ***   Problem List / ED Course / Critical Interventions / Medication Management  *** I ordered  medications including: *** for ***  Reevaluation of the patient after these medicines showed that the patient {resolved/improved/worsened:23923::"improved"} I have reviewed the patients home medicines and have made adjustments as needed   Social Determinants of Health  ***   Test / Admission - Considered  ***   {Document critical care time when appropriate:1} {Document review of labs and clinical decision tools ie heart score, Chads2Vasc2 etc:1}  {Document your independent review of radiology images, and any outside records:1} {Document your discussion with family members, caretakers, and with consultants:1} {Document social determinants of health affecting pt's care:1} {Document your decision making why or why not admission, treatments were needed:1} Final Clinical Impression(s) / ED Diagnoses Final diagnoses:  None    Rx / DC Orders ED Discharge Orders     None

## 2023-11-22 NOTE — ED Notes (Signed)
 Pt informed that urine sample was needed. Pt stated they did not have to urinate at this time and would call out when they had to go.
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
## 2023-11-22 NOTE — ED Notes (Signed)
 Patient transported to X-ray

## 2023-11-23 LAB — URINALYSIS, ROUTINE W REFLEX MICROSCOPIC
Bilirubin Urine: NEGATIVE
Glucose, UA: NEGATIVE mg/dL
Hgb urine dipstick: NEGATIVE
Ketones, ur: NEGATIVE mg/dL
Nitrite: NEGATIVE
Protein, ur: NEGATIVE mg/dL
Specific Gravity, Urine: 1.017 (ref 1.005–1.030)
pH: 6 (ref 5.0–8.0)

## 2023-11-23 MED ORDER — CEPHALEXIN 500 MG PO CAPS
500.0000 mg | ORAL_CAPSULE | Freq: Once | ORAL | Status: AC
  Administered 2023-11-23: 500 mg via ORAL
  Filled 2023-11-23: qty 1

## 2023-11-23 MED ORDER — CEPHALEXIN 500 MG PO CAPS: 500.0000 mg | ORAL_CAPSULE | Freq: Four times a day (QID) | ORAL | 0 refills | Status: AC

## 2023-11-23 NOTE — Discharge Instructions (Addendum)
 As discussed, your urine shows signs of infection. Take Keflex (antibiotic) 4 times a day for the next week for UTI. Your urine has been sent for culture, you may receive a call from the hospital if you need to be switched to a different antibiotic. If you don't receive a call, continue Keflex.  Follow up with your PCP in the next 3 days for reevaluation.  Get help right away if: You vomit each time you eat or drink. You have watery poop and can't eat or drink. You have trouble talking, walking, swallowing, or using your arms, hands, or legs. You feel very weak. You're bleeding. You're not thinking clearly, or you have trouble forming sentences. A friend or family member may spot this. Your vision changes, or you get a very bad headache.

## 2023-11-23 NOTE — ED Notes (Signed)
 [facility] called

## 2023-11-24 LAB — URINE CULTURE

## 2023-12-24 ENCOUNTER — Other Ambulatory Visit: Payer: Self-pay

## 2023-12-24 ENCOUNTER — Observation Stay (HOSPITAL_COMMUNITY)
Admission: EM | Admit: 2023-12-24 | Discharge: 2023-12-26 | Disposition: A | Discharge status: Home / Self Care | Attending: Internal Medicine | Admitting: Internal Medicine

## 2023-12-24 DIAGNOSIS — D649 Anemia, unspecified: Secondary | ICD-10-CM | POA: Diagnosis not present

## 2023-12-24 DIAGNOSIS — R55 Syncope and collapse: Principal | ICD-10-CM

## 2023-12-24 DIAGNOSIS — Z7982 Long term (current) use of aspirin: Secondary | ICD-10-CM | POA: Insufficient documentation

## 2023-12-24 DIAGNOSIS — I69354 Hemiplegia and hemiparesis following cerebral infarction affecting left non-dominant side: Secondary | ICD-10-CM | POA: Insufficient documentation

## 2023-12-24 DIAGNOSIS — I2510 Atherosclerotic heart disease of native coronary artery without angina pectoris: Secondary | ICD-10-CM | POA: Insufficient documentation

## 2023-12-24 DIAGNOSIS — R944 Abnormal results of kidney function studies: Secondary | ICD-10-CM | POA: Insufficient documentation

## 2023-12-24 DIAGNOSIS — G8929 Other chronic pain: Secondary | ICD-10-CM | POA: Diagnosis not present

## 2023-12-24 DIAGNOSIS — R42 Dizziness and giddiness: Secondary | ICD-10-CM | POA: Diagnosis not present

## 2023-12-24 DIAGNOSIS — N182 Chronic kidney disease, stage 2 (mild): Secondary | ICD-10-CM | POA: Diagnosis not present

## 2023-12-24 DIAGNOSIS — Z79899 Other long term (current) drug therapy: Secondary | ICD-10-CM | POA: Diagnosis not present

## 2023-12-24 DIAGNOSIS — I5032 Chronic diastolic (congestive) heart failure: Secondary | ICD-10-CM | POA: Insufficient documentation

## 2023-12-24 DIAGNOSIS — E1122 Type 2 diabetes mellitus with diabetic chronic kidney disease: Secondary | ICD-10-CM | POA: Insufficient documentation

## 2023-12-24 DIAGNOSIS — R5381 Other malaise: Secondary | ICD-10-CM | POA: Insufficient documentation

## 2023-12-24 DIAGNOSIS — I130 Hypertensive heart and chronic kidney disease with heart failure and stage 1 through stage 4 chronic kidney disease, or unspecified chronic kidney disease: Secondary | ICD-10-CM | POA: Diagnosis not present

## 2023-12-24 DIAGNOSIS — Z87891 Personal history of nicotine dependence: Secondary | ICD-10-CM | POA: Insufficient documentation

## 2023-12-24 LAB — CBC
HCT: 30.6 % — ABNORMAL LOW (ref 39.0–52.0)
Hemoglobin: 9.9 g/dL — ABNORMAL LOW (ref 13.0–17.0)
MCH: 28.3 pg (ref 26.0–34.0)
MCHC: 32.4 g/dL (ref 30.0–36.0)
MCV: 87.4 fL (ref 80.0–100.0)
Platelets: 172 10*3/uL (ref 150–400)
RBC: 3.5 MIL/uL — ABNORMAL LOW (ref 4.22–5.81)
RDW: 14.6 % (ref 11.5–15.5)
WBC: 5 10*3/uL (ref 4.0–10.5)
nRBC: 0 % (ref 0.0–0.2)

## 2023-12-24 LAB — BASIC METABOLIC PANEL WITH GFR
Anion gap: 9 (ref 5–15)
BUN: 22 mg/dL (ref 8–23)
CO2: 24 mmol/L (ref 22–32)
Calcium: 8.7 mg/dL — ABNORMAL LOW (ref 8.9–10.3)
Chloride: 107 mmol/L (ref 98–111)
Creatinine, Ser: 1.28 mg/dL — ABNORMAL HIGH (ref 0.61–1.24)
GFR, Estimated: 57 mL/min — ABNORMAL LOW (ref >60)
Glucose, Bld: 114 mg/dL — ABNORMAL HIGH (ref 70–99)
Potassium: 3.5 mmol/L (ref 3.5–5.1)
Sodium: 140 mmol/L (ref 135–145)

## 2023-12-24 LAB — TROPONIN I (HIGH SENSITIVITY)
Troponin I (High Sensitivity): 9 ng/L (ref <18)
Troponin I (High Sensitivity): 9 ng/L (ref <18)

## 2023-12-24 MED ORDER — LACTATED RINGERS IV BOLUS
500.0000 mL | Freq: Once | INTRAVENOUS | Status: AC
  Administered 2023-12-24: 500 mL via INTRAVENOUS

## 2023-12-24 MED ORDER — ONDANSETRON HCL 4 MG/2ML IJ SOLN: 4.0000 mg | Freq: Four times a day (QID) | INTRAMUSCULAR | Status: DC | PRN

## 2023-12-24 MED ORDER — ATORVASTATIN CALCIUM 10 MG PO TABS
20.0000 mg | ORAL_TABLET | Freq: Every day | ORAL | Status: DC
  Administered 2023-12-25 – 2023-12-26 (×2): 20 mg via ORAL
  Filled 2023-12-24 (×2): qty 2

## 2023-12-24 MED ORDER — IRBESARTAN 150 MG PO TABS
150.0000 mg | ORAL_TABLET | Freq: Every day | ORAL | Status: DC
  Administered 2023-12-25 – 2023-12-26 (×2): 150 mg via ORAL
  Filled 2023-12-24 (×2): qty 1

## 2023-12-24 MED ORDER — POLYETHYLENE GLYCOL 3350 17 G PO PACK: 17.0000 g | PACK | Freq: Every day | ORAL | Status: DC | PRN

## 2023-12-24 MED ORDER — ACETAMINOPHEN 500 MG PO TABS
1000.0000 mg | ORAL_TABLET | Freq: Four times a day (QID) | ORAL | Status: DC | PRN
  Administered 2023-12-25: 1000 mg via ORAL
  Filled 2023-12-24: qty 2

## 2023-12-24 MED ORDER — SODIUM CHLORIDE 0.9% FLUSH
3.0000 mL | Freq: Two times a day (BID) | INTRAVENOUS | Status: DC
  Administered 2023-12-25 – 2023-12-26 (×3): 3 mL via INTRAVENOUS

## 2023-12-24 MED ORDER — MELATONIN 3 MG PO TABS: 6.0000 mg | ORAL_TABLET | Freq: Every evening | ORAL | Status: DC | PRN

## 2023-12-24 MED ORDER — AMLODIPINE BESYLATE 10 MG PO TABS
10.0000 mg | ORAL_TABLET | Freq: Every day | ORAL | Status: DC
  Administered 2023-12-25 – 2023-12-26 (×2): 10 mg via ORAL
  Filled 2023-12-24 (×2): qty 1

## 2023-12-24 MED ORDER — MECLIZINE HCL 25 MG PO TABS
12.5000 mg | ORAL_TABLET | Freq: Once | ORAL | Status: AC
  Administered 2023-12-24: 12.5 mg via ORAL

## 2023-12-24 MED ORDER — MECLIZINE HCL 25 MG PO TABS
25.0000 mg | ORAL_TABLET | Freq: Once | ORAL | Status: DC
  Filled 2023-12-24: qty 1

## 2023-12-24 MED ORDER — ASPIRIN 81 MG PO TBEC
81.0000 mg | DELAYED_RELEASE_TABLET | Freq: Every day | ORAL | Status: DC
  Administered 2023-12-25 – 2023-12-26 (×2): 81 mg via ORAL
  Filled 2023-12-24 (×2): qty 1

## 2023-12-24 NOTE — ED Notes (Signed)
 Attempted to ambulate patient. Patient unable to stand safely on his own with his personal Left leg brace on and RN assistance. MD Tee at bedside.

## 2023-12-24 NOTE — ED Notes (Signed)
 Assisted patient with urinal, placed in hospital gown, patient cleaned, new adult brief applied, patient repositioned in bed, clothing placed in personal belongings bag at bedside. Bed locked in lowest possible potion, call light within reach, warm blankets given.
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
## 2023-12-24 NOTE — ED Triage Notes (Signed)
 EMS from home. PT states suddenly diaphoretic, dizzy, son helped him to the couch. Pt also c/o left leg swelling and pain. In brace.  Ems on scene states pt was bradycardic on scene in the 40's with a heart block per ems, bp 110/60,  upon arrival now 126/78, 60's SR, bgl 157 20 Left FA, with on NS hanging.  Pt has hx vertigo and took meclizine 

## 2023-12-25 ENCOUNTER — Observation Stay (HOSPITAL_COMMUNITY)

## 2023-12-25 ENCOUNTER — Encounter (HOSPITAL_COMMUNITY): Payer: Self-pay | Admitting: Internal Medicine

## 2023-12-25 DIAGNOSIS — R42 Dizziness and giddiness: Principal | ICD-10-CM

## 2023-12-25 DIAGNOSIS — I361 Nonrheumatic tricuspid (valve) insufficiency: Secondary | ICD-10-CM

## 2023-12-25 DIAGNOSIS — R5381 Other malaise: Secondary | ICD-10-CM | POA: Insufficient documentation

## 2023-12-25 LAB — CBC
HCT: 33.3 % — ABNORMAL LOW (ref 39.0–52.0)
Hemoglobin: 10.9 g/dL — ABNORMAL LOW (ref 13.0–17.0)
MCH: 27.9 pg (ref 26.0–34.0)
MCHC: 32.7 g/dL (ref 30.0–36.0)
MCV: 85.4 fL (ref 80.0–100.0)
Platelets: 192 10*3/uL (ref 150–400)
RBC: 3.9 MIL/uL — ABNORMAL LOW (ref 4.22–5.81)
RDW: 14.7 % (ref 11.5–15.5)
WBC: 3.8 10*3/uL — ABNORMAL LOW (ref 4.0–10.5)
nRBC: 0 % (ref 0.0–0.2)

## 2023-12-25 LAB — BASIC METABOLIC PANEL WITH GFR
Anion gap: 9 (ref 5–15)
BUN: 18 mg/dL (ref 8–23)
CO2: 23 mmol/L (ref 22–32)
Calcium: 8.9 mg/dL (ref 8.9–10.3)
Chloride: 107 mmol/L (ref 98–111)
Creatinine, Ser: 0.98 mg/dL (ref 0.61–1.24)
GFR, Estimated: >60 mL/min (ref >60)
Glucose, Bld: 93 mg/dL (ref 70–99)
Potassium: 3.6 mmol/L (ref 3.5–5.1)
Sodium: 139 mmol/L (ref 135–145)

## 2023-12-25 LAB — IRON AND TIBC
Iron: 51 ug/dL (ref 45–182)
Saturation Ratios: 17 % — ABNORMAL LOW (ref 17.9–39.5)
TIBC: 294 ug/dL (ref 250–450)
UIBC: 243 ug/dL

## 2023-12-25 LAB — ECHOCARDIOGRAM LIMITED
Calc EF: 74.8 %
Height: 67 in
Single Plane A2C EF: 78.4 %
Single Plane A4C EF: 72.1 %
Weight: 3216.95 [oz_av]

## 2023-12-25 LAB — PHOSPHORUS: Phosphorus: 3.7 mg/dL (ref 2.5–4.6)

## 2023-12-25 LAB — FOLATE: Folate: 19.8 ng/mL (ref >5.9)

## 2023-12-25 LAB — TRANSFERRIN: Transferrin: 203 mg/dL (ref 180–329)

## 2023-12-25 LAB — FERRITIN: Ferritin: 32 ng/mL (ref 24–336)

## 2023-12-25 LAB — MAGNESIUM: Magnesium: 2 mg/dL (ref 1.7–2.4)

## 2023-12-25 LAB — VITAMIN B12: Vitamin B-12: 342 pg/mL (ref 180–914)

## 2023-12-25 MED ORDER — ENSURE ENLIVE PO LIQD
237.0000 mL | Freq: Two times a day (BID) | ORAL | Status: DC
  Administered 2023-12-25 – 2023-12-26 (×4): 237 mL via ORAL

## 2023-12-25 NOTE — Progress Notes (Signed)
*  PRELIMINARY RESULTS* Echocardiogram 2D Echocardiogram has been performed.  Luis Poole 12/25/2023, 12:05 PM

## 2023-12-25 NOTE — Plan of Care (Signed)
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
## 2023-12-25 NOTE — Plan of Care (Signed)
 Patient AAOx4. Telemetry monitoring in progress. SCDs placed, heels elevated. NO complaints of pain. LBM 5/7. Safety precautions maintained.    Problem: Clinical Measurements: Goal: Ability to maintain clinical measurements within normal limits will improve Outcome: Progressing   Problem: Activity: Goal: Risk for activity intolerance will decrease Outcome: Progressing   Problem: Nutrition: Goal: Adequate nutrition will be maintained Outcome: Progressing   Problem: Pain Managment: Goal: General experience of comfort will improve and/or be controlled Outcome: Progressing

## 2023-12-25 NOTE — Care Management Obs Status (Signed)
 MEDICARE OBSERVATION STATUS NOTIFICATION   Patient Details  Name: Luis Poole MRN: [id number] Date of Birth: [birth year]   Medicare Observation Status Notification Given:     [facility] letter sign and copy given  [name] 12/25/2023, 1:42 PM

## 2023-12-25 NOTE — Progress Notes (Signed)
 Admission notes New pt from ED. Pt is A&O x 4. VSS, on room air. Left lower leg with swelling noted and +doppler pulse. C/o 8-9/10 pain, Dr. Michell Ahumada notified. prn tylenol  given as ordered.  Sinus brady /sinus rhythm on the cardiac monitor.  Educated pt on falls and safety precautions, call bell and plan of care, pt verbalizes understanding. Bed alarm on. Call bell in reach.   2 RN skin assessment done with Ronnell Coins, Charge RN

## 2023-12-26 DIAGNOSIS — R42 Dizziness and giddiness: Secondary | ICD-10-CM | POA: Diagnosis not present

## 2023-12-26 LAB — BASIC METABOLIC PANEL WITH GFR
Anion gap: 9 (ref 5–15)
BUN: 23 mg/dL (ref 8–23)
CO2: 21 mmol/L — ABNORMAL LOW (ref 22–32)
Calcium: 8.5 mg/dL — ABNORMAL LOW (ref 8.9–10.3)
Chloride: 107 mmol/L (ref 98–111)
Creatinine, Ser: 1.37 mg/dL — ABNORMAL HIGH (ref 0.61–1.24)
GFR, Estimated: 52 mL/min — ABNORMAL LOW (ref >60)
Glucose, Bld: 111 mg/dL — ABNORMAL HIGH (ref 70–99)
Potassium: 3.7 mmol/L (ref 3.5–5.1)
Sodium: 137 mmol/L (ref 135–145)

## 2023-12-26 LAB — CBC
HCT: 32.4 % — ABNORMAL LOW (ref 39.0–52.0)
Hemoglobin: 10.6 g/dL — ABNORMAL LOW (ref 13.0–17.0)
MCH: 28 pg (ref 26.0–34.0)
MCHC: 32.7 g/dL (ref 30.0–36.0)
MCV: 85.7 fL (ref 80.0–100.0)
Platelets: 164 10*3/uL (ref 150–400)
RBC: 3.78 MIL/uL — ABNORMAL LOW (ref 4.22–5.81)
RDW: 14.7 % (ref 11.5–15.5)
WBC: 5.2 10*3/uL (ref 4.0–10.5)
nRBC: 0 % (ref 0.0–0.2)

## 2023-12-26 MED ORDER — FUROSEMIDE 20 MG PO TABS: 20.0000 mg | ORAL_TABLET | ORAL | Status: DC

## 2023-12-26 MED ORDER — FERROUS SULFATE 325 (65 FE) MG PO TABS
325.0000 mg | ORAL_TABLET | Freq: Every day | ORAL | Status: DC
  Administered 2023-12-26: 325 mg via ORAL
  Filled 2023-12-26: qty 1

## 2023-12-26 NOTE — Plan of Care (Signed)

## 2023-12-26 NOTE — Progress Notes (Signed)
 Transition of Care [facility]) - Inpatient Brief Assessment   Patient Details  Name: Luis Poole MRN: [id number] Date of Birth: 01/26/1941  [facility]) CM/SW Contact:    Dane Dung, RN Phone Number: 12/26/2023, 4:19 PM   Clinical Narrative: CM met with the patient at the bedside to discuss patient's discharge to home.  Patient admitted to the hospital lightheadedness symptoms.  Patient has WC, RW, rolator at home.  Patient states that he lives with his son and usually discharges home by [facility].  PTAR was called and scheduled for 5 pm since RN will be at the bedside to give discharge instructions.  Patient has key to his home.   Transition of Care Asessment: Insurance and Status: (P) Insurance coverage has been reviewed Patient has primary care physician: (P) Yes Home environment has been reviewed: (P) from home with son, Luis Poole Prior level of function:: (P) WC Prior/Current Home Services: (P) No current home services Social Drivers of Health Review: (P) SDOH reviewed interventions complete Readmission risk has been reviewed: (P) Yes Transition of care needs: (P) transition of care needs identified, TOC will continue to follow

## 2023-12-26 NOTE — Care Management Obs Status (Signed)
 MEDICARE OBSERVATION STATUS NOTIFICATION   Patient Details  Name: Luis Poole MRN: [id number] Date of Birth: 1940-10-07   Medicare Observation Status Notification Given:  Yes [facility]/Obs letter  signed and copy provided to the patient   [name] 12/26/2023, 9:25 AM

## 2023-12-26 NOTE — Plan of Care (Signed)
 Patient AAOx4. Telemetry monitoring in progress. SCDs on, heels elevated. LBM today. OOB to chair. Safety precautions maintained.   Discharge instructions discussed with patient, verbalized understanding. Discharge paperwork ready for PTAR at nurses station. Awaiting PT[facility] arrival.  Problem: Health Behavior/Discharge Planning: Goal: Ability to manage health-related needs will improve Outcome: Progressing   Problem: Activity: Goal: Risk for activity intolerance will decrease Outcome: Progressing   Problem: Nutrition: Goal: Adequate nutrition will be maintained Outcome: Progressing   Problem: Safety: Goal: Ability to remain free from injury will improve Outcome: Progressing

## 2023-12-26 NOTE — Progress Notes (Signed)
 Orthostatic vitals  Lying BP: 113/53 HR: 55  Sitting BP: 142/59 HR: 58  Standing BP: 157/66 HR: 69

## 2023-12-26 NOTE — Discharge Summary (Addendum)
 Physician Discharge Summary   Patient: Luis Poole MRN: [id number] DOB: 1941/01/02  Admit date:     12/24/2023  Discharge date: 12/26/23  Discharge Physician: MDALA-GAUSI, Jilda Most   PCP: Alfredia Ina, MD   Recommendations at discharge:    Follow up with PCP  Discharge Diagnoses: Principal Problem:   Lightheadedness Active Problems:   Normocytic anemia   Debility  Resolved Problems:   * No resolved hospital problems. *  Hospital Course: 83 year old man with PMH of CVA with left hemiparesis, CAD, HFpEF, diabetes, hypertension, hyperlipidemia, CKD 2, chronic pain, spinal stimulator, debility now mostly wheelchair-bound over the past few years, who presented with presyncope.   The hospital course is in problem-based format below:    Presyncope  Likely due to hypovolemia. Received IV fluid bolus in ED. Patient kept on telemetry. Vitals remained stable. Historical data with hyperdynamic LV function and associated LVOT.  TTE repeated on 5/8 to reevaluate.  Per report, EF 65 to 70%.  Patient has hyperdynamic left ventricle and NOT hypertrophic cardiomyopathy. Home medications were adjusted. Lasix  switched to every other day. Patient to follow-up with PCP.   Mild elevation in creatinine and patient with CKD stage II Does not meet criteria for AKI. Patient presented with creatinine of 1.28 from baseline of 1.1-1.2. Creatinine returned to baseline after IV fluids. P.o. fluid intake was encouraged.   Progressive anemia Baseline hemoglobin appears to be 12, downtrend over the past few months and 9.9 on admission.  Denies any bleeding sites Iron panel, B12, folate unremarkable. For outpatient follow-up.   Chronic medical problems: CVA with left hemiparesis/CAD: Continue Home Aspirin , Atorvastatin  HFpEF: Without acute exacerbation Diabetes type 2: Diet controlled, not currently requiring SSI Hypertension: Home medications adjusted.  Please see below. Chronic pain, spinal  stimulator: Noted Debility, mostly wheelchair-bound, chronic left-sided weakness: Patient remained close to baseline.       Consultants: n/a Procedures performed: n/a  Disposition: Home Diet recommendation:  Carb modified diet DISCHARGE MEDICATION: Allergies as of 12/26/2023       Reactions   Ibuprofen  Itching   Lisinopril Other (See Comments)   Swollen lips   Pregabalin  Swelling   Recently filled 11/24 for 90ds and reports taking on 08/11/23   Gabapentin  Other (See Comments)   Dizziness        Medication List     STOP taking these medications    cephALEXin  500 MG capsule Commonly known as: KEFLEX    furosemide  20 MG tablet Commonly known as: LASIX        TAKE these medications    allopurinol  100 MG tablet Commonly known as: ZYLOPRIM  Take 100 mg by mouth daily.   amLODipine  10 MG tablet Commonly known as: NORVASC  Take 0.5 tablets (5 mg total) by mouth daily.   aspirin  EC 81 MG tablet Take 81 mg by mouth daily.   atorvastatin  20 MG tablet Commonly known as: LIPITOR Take 1 tablet (20 mg total) by mouth daily. What changed:  when to take this reasons to take this   colchicine  0.6 MG tablet Take 1 tablet (0.6 mg total) daily by mouth. What changed:  when to take this reasons to take this   ferrous sulfate  325 (65 FE) MG tablet Take 325 mg by mouth daily with breakfast.   meclizine  12.5 MG tablet Commonly known as: ANTIVERT  Take 1 tablet (12.5 mg total) by mouth 3 (three) times daily as needed for dizziness.   polyethylene glycol 17 g packet Commonly known as: MIRALAX  / GLYCOLAX  Take 17 g  by mouth daily as needed for mild constipation.   valsartan  160 MG tablet Commonly known as: DIOVAN  Take 1 tablet (160 mg total) by mouth daily.        Discharge Exam: Filed Weights   12/25/23 0023  Weight: 91.2 kg   Physical Exam on Day of Discharge   General: Alert, cheerful, oriented X3  Oral cavity: moist mucous membranes  Neck: supple   Chest: clear to auscultation. No crackles, no wheezes  CVS: S1,S2 RRR. No murmurs  Abd: No distention, soft, non-tender. No masses palpable  Extr: No edema    Condition at discharge: stable  The results of significant diagnostics from this hospitalization (including imaging, microbiology, ancillary and laboratory) are listed below for reference.   Imaging Studies: ECHOCARDIOGRAM LIMITED Result Date: 12/25/2023    ECHOCARDIOGRAM LIMITED REPORT   Patient Name:   Luis Poole Date of Exam: 12/25/2023 Medical Rec #:  [id number]     Height:       67.0 in Accession #:    [id number]    Weight:       201.1 lb Date of Birth:  06/18/1941      BSA:          2.027 m Patient Age:    82 years      BP:           145/78 mmHg Patient Gender: M             HR:           58 bpm. Exam Location:  Inpatient Procedure: 2D Echo, Cardiac Doppler and Color Doppler (Both Spectral and Color            Flow Doppler were utilized during procedure). Indications:    Evaluate LVOT/mid cavitary gradient  History:        Patient has prior history of Echocardiogram examinations, most                 recent 08/12/2023.  Sonographer:    Andrena Bang Referring Phys: [id number] JONATHAN SEGARS IMPRESSIONS  1. There is a mid cavitary gradient. Peak velocity 3.2 m/s. Peak gradient 41 mmHg. With Valsalva this increases to 3.3 m/s and 43 mmHg. Gradient is increased due to hyperdynamic LV, not hypertrophic cardiomyopathy. Left ventricular ejection fraction, by  estimation, is 65 to 70%. Left ventricular ejection fraction by 2D MOD biplane is 74.8 %. The left ventricle has normal function.  2. Right ventricular systolic function is normal. The right ventricular size is normal.  3. The mitral valve is normal in structure. No evidence of mitral valve regurgitation. No evidence of mitral stenosis. FINDINGS  Left Ventricle: There is a mid cavitary gradient. Peak velocity 3.2 m/s. Peak gradient 41 mmHg. With Valsalva this increases to 3.3 m/s and 43 mmHg.  Gradient is increased due to hyperdynamic LV, not hypertrophic cardiomyopathy. Left ventricular ejection  fraction, by estimation, is 65 to 70%. Left ventricular ejection fraction by 2D MOD biplane is 74.8 %. The left ventricle has normal function. The left ventricular internal cavity size was normal in size. There is no left ventricular hypertrophy. Right Ventricle: The right ventricular size is normal. No increase in right ventricular wall thickness. Right ventricular systolic function is normal. Mitral Valve: The mitral valve is normal in structure. No evidence of mitral valve stenosis. Tricuspid Valve: The tricuspid valve is normal in structure. Tricuspid valve regurgitation is mild . No evidence of tricuspid stenosis.  LV Volumes (MOD)  Biplane EF (MOD) LV vol d, MOD    82.7 ml       LV Biplane EF:   Left A2C:                                            ventricular LV vol d, MOD    104.0 ml                       ejection A4C:                                            fraction by LV vol s, MOD    17.9 ml                        2D MOD A2C:                                            biplane is LV vol s, MOD    29.0 ml                        74.8 %. A4C: LV SV MOD A2C:   64.8 ml LV SV MOD A4C:   104.0 ml LV SV MOD BP:    69.4 ml TRICUSPID VALVE TR Peak grad:   21.9 mmHg TR Vmax:        234.00 cm/s Maudine Sos MD Electronically signed by Maudine Sos MD Signature Date/Time: 12/25/2023/3:57:08 PM    Final     Microbiology: Results for orders placed or performed during the hospital encounter of 11/22/23  Urine Culture     Status: Abnormal   Collection Time: 11/23/23  1:28 AM   Specimen: Urine, Clean Catch  Result Value Ref Range Status   Specimen Description   Final    URINE, CLEAN CATCH Performed at [facility], [address] [place], [place] [address]    Special Requests   Final    NONE Performed at [facility], [address]  [place], [place] [address]    Culture MULTIPLE SPECIES PRESENT, SUGGEST RECOLLECTION (A)  Final   Report Status 11/24/2023 FINAL  Final    Labs: CBC: Recent Labs  Lab 12/24/23 1623 12/25/23 0729 12/26/23 0736  WBC 5.0 3.8* 5.2  HGB 9.9* 10.9* 10.6*  HCT 30.6* 33.3* 32.4*  MCV 87.4 85.4 85.7  PLT 172 192 164   Basic Metabolic Panel: Recent Labs  Lab 12/24/23 1623 12/25/23 0729 12/26/23 0736  NA 140 139 137  K 3.5 3.6 3.7  CL 107 107 107  CO2 24 23 21*  GLUCOSE 114* 93 111*  BUN 22 18 23   CREATININE 1.28* 0.98 1.37*  CALCIUM  8.7* 8.9 8.5*  MG  --  2.0  --   PHOS  --  3.7  --    Liver Function Tests: No results for input(s): "AST", "ALT", "ALKPHOS", "BILITOT", "PROT", "ALBUMIN" in the last 168 hours. CBG: No results for input(s): "GLUCAP" in the last 168 hours.  Discharge time spent: greater than 30 minutes.  Signed: MDALA-GAUSI, Keefer Soulliere AGATHA, MD  [facility] 12/26/2023

## 2024-01-07 ENCOUNTER — Emergency Department (HOSPITAL_COMMUNITY)

## 2024-01-07 ENCOUNTER — Emergency Department (HOSPITAL_COMMUNITY)
Admission: EM | Admit: 2024-01-07 | Discharge: 2024-01-08 | Discharge status: Against Medical Advice | Attending: Emergency Medicine | Admitting: Emergency Medicine

## 2024-01-07 ENCOUNTER — Other Ambulatory Visit: Payer: Self-pay

## 2024-01-07 DIAGNOSIS — R251 Tremor, unspecified: Secondary | ICD-10-CM | POA: Diagnosis present

## 2024-01-07 DIAGNOSIS — E119 Type 2 diabetes mellitus without complications: Secondary | ICD-10-CM | POA: Insufficient documentation

## 2024-01-07 DIAGNOSIS — I10 Essential (primary) hypertension: Secondary | ICD-10-CM | POA: Insufficient documentation

## 2024-01-07 DIAGNOSIS — Z7982 Long term (current) use of aspirin: Secondary | ICD-10-CM | POA: Insufficient documentation

## 2024-01-07 DIAGNOSIS — I2510 Atherosclerotic heart disease of native coronary artery without angina pectoris: Secondary | ICD-10-CM | POA: Insufficient documentation

## 2024-01-07 DIAGNOSIS — Z79899 Other long term (current) drug therapy: Secondary | ICD-10-CM | POA: Insufficient documentation

## 2024-01-07 LAB — COMPREHENSIVE METABOLIC PANEL WITH GFR
ALT: 12 U/L (ref 0–44)
AST: 21 U/L (ref 15–41)
Albumin: 3.6 g/dL (ref 3.5–5.0)
Alkaline Phosphatase: 63 U/L (ref 38–126)
Anion gap: 11 (ref 5–15)
BUN: 21 mg/dL (ref 8–23)
CO2: 21 mmol/L — ABNORMAL LOW (ref 22–32)
Calcium: 9.2 mg/dL (ref 8.9–10.3)
Chloride: 106 mmol/L (ref 98–111)
Creatinine, Ser: 1.44 mg/dL — ABNORMAL HIGH (ref 0.61–1.24)
GFR, Estimated: 49 mL/min — ABNORMAL LOW (ref >60)
Glucose, Bld: 104 mg/dL — ABNORMAL HIGH (ref 70–99)
Potassium: 3.8 mmol/L (ref 3.5–5.1)
Sodium: 138 mmol/L (ref 135–145)
Total Bilirubin: 0.7 mg/dL (ref 0.0–1.2)
Total Protein: 7.5 g/dL (ref 6.5–8.1)

## 2024-01-07 LAB — CBC WITH DIFFERENTIAL/PLATELET
Abs Immature Granulocytes: 0 10*3/uL (ref 0.00–0.07)
Basophils Absolute: 0 10*3/uL (ref 0.0–0.1)
Basophils Relative: 1 %
Eosinophils Absolute: 0.1 10*3/uL (ref 0.0–0.5)
Eosinophils Relative: 1 %
HCT: 34.4 % — ABNORMAL LOW (ref 39.0–52.0)
Hemoglobin: 11.1 g/dL — ABNORMAL LOW (ref 13.0–17.0)
Immature Granulocytes: 0 %
Lymphocytes Relative: 38 %
Lymphs Abs: 2.2 10*3/uL (ref 0.7–4.0)
MCH: 28.1 pg (ref 26.0–34.0)
MCHC: 32.3 g/dL (ref 30.0–36.0)
MCV: 87.1 fL (ref 80.0–100.0)
Monocytes Absolute: 0.4 10*3/uL (ref 0.1–1.0)
Monocytes Relative: 8 %
Neutro Abs: 2.9 10*3/uL (ref 1.7–7.7)
Neutrophils Relative %: 52 %
Platelets: 217 10*3/uL (ref 150–400)
RBC: 3.95 MIL/uL — ABNORMAL LOW (ref 4.22–5.81)
RDW: 13.9 % (ref 11.5–15.5)
WBC: 5.7 10*3/uL (ref 4.0–10.5)
nRBC: 0 % (ref 0.0–0.2)

## 2024-01-07 LAB — TROPONIN I (HIGH SENSITIVITY): Troponin I (High Sensitivity): 10 ng/L (ref <18)

## 2024-01-07 MED ORDER — LORAZEPAM 1 MG PO TABS
0.5000 mg | ORAL_TABLET | Freq: Once | ORAL | Status: AC
  Administered 2024-01-07: 0.5 mg via ORAL
  Filled 2024-01-07: qty 1

## 2024-01-07 MED ORDER — AMLODIPINE BESYLATE 5 MG PO TABS
5.0000 mg | ORAL_TABLET | Freq: Once | ORAL | Status: AC
  Administered 2024-01-07: 5 mg via ORAL
  Filled 2024-01-07: qty 1

## 2024-01-07 NOTE — ED Triage Notes (Signed)
 Pt BIB EMS from home. Pt reporting tremors in his left leg and left arm that started x1 week ago. Pt reports that he has a back stimulator and has had tremors off and on since having it placed but now they are more consistent. Pt reports pain 10/10. Pt also reports left leg swelling.

## 2024-01-07 NOTE — ED Provider Notes (Signed)
 Patient signed out to me at 1600 by Dr. Synetta Eves pending CT read.  In short this is an 83 year old male with a past medical history of hypertension, diabetes, chronic back pain with spinal cord stimulator in place and CAD that presented to the emergency department with tremors in his left upper extremity.  Reportedly these have been going on and off for several years.  The patient was hemodynamically stable on arrival here in no acute distress without focal neurologic deficits and not actively tremulous.  Patient's labs are at his baseline without significant abnormality.  Patient's chest x-ray showed no acute disease.  Did have head CT here that shows no acute abnormality.  Patient was recommended outpatient follow-up and was given strict return precaution.   Luis Poole, Luis Magallon K, DO 01/07/24 [phone]

## 2024-01-07 NOTE — ED Notes (Signed)
 PTAR called, 13th in line.

## 2024-01-07 NOTE — ED Provider Notes (Signed)
 Luis Poole EMERGENCY DEPARTMENT AT [facility] Provider Note   CSN: [id number] Arrival date & time: 01/07/24  0210     History  Chief Complaint  Patient presents with   Tremors    Luis Poole is a 83 y.o. male.  HPI 83 year old male history of chronic low back pain, diabetes, hypertension, spinal cord stimulator in place, CAD, presents today stating that he had tremors of his left arm.  He states that they have come and gone repeatedly over the past 5 to 10 years.  He has had 3 episodes today.  That is chiefly in the left arm.  He remains awake and alert and oriented.  He is also complaining of low back pain which has been present since 2012.  He denies any fever, chest pain, dyspnea, nausea, vomiting, diarrhea.  He states he does have some pain in his left lower leg where he struck it on a table in the past.    Home Medications Prior to Admission medications   Medication Sig Start Date End Date Taking? Authorizing Provider  allopurinol  (ZYLOPRIM ) 100 MG tablet Take 100 mg by mouth daily.    [provider]  amLODipine  (NORVASC ) 10 MG tablet Take 0.5 tablets (5 mg total) by mouth daily. 08/12/23   Danford, Willis Harter, MD  aspirin  EC 81 MG tablet Take 81 mg by mouth daily.    [provider]  atorvastatin  (LIPITOR) 20 MG tablet Take 1 tablet (20 mg total) by mouth daily. Patient taking differently: Take 20 mg by mouth daily as needed (High Cholesterol). 10/09/17   Leona Rake, MD  colchicine  0.6 MG tablet Take 1 tablet (0.6 mg total) daily by mouth. Patient taking differently: Take 0.6 mg by mouth daily as needed (gout pain). 06/29/17   Clementine Cutting, MD  ferrous sulfate  325 (65 FE) MG tablet Take 325 mg by mouth daily with breakfast.    [provider]  meclizine  (ANTIVERT ) 12.5 MG tablet Take 1 tablet (12.5 mg total) by mouth 3 (three) times daily as needed for dizziness. 08/02/23   Carin Charleston, MD  polyethylene glycol (MIRALAX   / GLYCOLAX ) 17 g packet Take 17 g by mouth daily as needed for mild constipation. 11/29/22   Rizwan, Saima, MD  valsartan  (DIOVAN ) 160 MG tablet Take 1 tablet (160 mg total) by mouth daily. 04/10/23   Von Grumbling, PA-C      Allergies    Ibuprofen , Lisinopril, Pregabalin , and Gabapentin     Review of Systems   Review of Systems  Physical Exam Updated Vital Signs BP (!) 170/85   Pulse (!) 55   Temp 98 F (36.7 C) (Oral)   Resp 18   Ht 1.702 m (5\' 7" )   Wt 92.5 kg   SpO2 100%   BMI 31.95 kg/m  Physical Exam Vitals and nursing note reviewed.  HENT:     Head: Normocephalic.     Right Ear: External ear normal.     Left Ear: External ear normal.     Nose: Nose normal.     Mouth/Throat:     Pharynx: Oropharynx is clear.  Eyes:     Extraocular Movements: Extraocular movements intact.     Pupils: Pupils are equal, round, and reactive to light.  Cardiovascular:     Rate and Rhythm: Normal rate and regular rhythm.     Pulses: Normal pulses.     Heart sounds: Normal heart sounds.  Pulmonary:     Effort: Pulmonary effort  is normal.  Abdominal:     General: Abdomen is flat.     Palpations: Abdomen is soft.  Musculoskeletal:     Cervical back: Normal range of motion.  Skin:    General: Skin is warm and dry.     Capillary Refill: Capillary refill takes less than 2 seconds.  Neurological:     General: No focal deficit present.     Mental Status: He is alert.     Motor: No weakness.     Coordination: Coordination normal.  Psychiatric:        Mood and Affect: Mood normal.     ED Results / Procedures / Treatments   Labs (all labs ordered are listed, but only abnormal results are displayed) Labs Reviewed  CBC WITH DIFFERENTIAL/PLATELET - Abnormal; Notable for the following components:      Result Value   RBC 3.95 (*)    Hemoglobin 11.1 (*)    HCT 34.4 (*)    All other components within normal limits  COMPREHENSIVE METABOLIC PANEL WITH GFR - Abnormal; Notable for the  following components:   CO2 21 (*)    Glucose, Bld 104 (*)    Creatinine, Ser 1.44 (*)    GFR, Estimated 49 (*)    All other components within normal limits  TROPONIN I (HIGH SENSITIVITY)  TROPONIN I (HIGH SENSITIVITY)    EKG EKG Interpretation Date/Time:  Wednesday Jan 07 2024 12:54:03 EDT Ventricular Rate:  57 PR Interval:  210 QRS Duration:  156 QT Interval:  478 QTC Calculation: 465 R Axis:   53  Text Interpretation: Sinus bradycardia with 1st degree A-V block Right bundle branch block Abnormal ECG When compared with ECG of 24-Dec-2023 15:51, PREVIOUS ECG IS PRESENT No significant change since last tracing Confirmed by Auston Blush [phone]) on 01/07/2024 2:59:41 PM  Radiology CT Head Wo Contrast Result Date: 01/07/2024 CLINICAL DATA:  Seizure, new-onset, no history of trauma EXAM: CT HEAD WITHOUT CONTRAST TECHNIQUE: Contiguous axial images were obtained from the base of the skull through the vertex without intravenous contrast. RADIATION DOSE REDUCTION: This exam was performed according to the departmental dose-optimization program which includes automated exposure control, adjustment of the mA and/or kV according to patient size and/or use of iterative reconstruction technique. COMPARISON:  August 02, 2023, October 08, 2017 FINDINGS: Brain: Proportional prominence of the ventricles and sulci, consistent with diffuse cerebral parenchymal volume loss. The ventricles otherwise maintained midline position without midline shift. Gray-white differentiation is preserved.Unchanged appearance of the chronic ACA territory infarct involving the high right frontal convexity.No evidence of acute territorial infarction, extra-axial fluid collection, hemorrhage, or mass lesion. The basilar cisterns are patent without downward herniation. The cerebellar hemispheres and vermis are well formed without mass lesion or focal attenuation abnormality. Chronic lacunar infarct within the left pons. Vascular:  No hyperdense vessel. Calcified atherosclerotic plaque within the cavernous/supraclinoid internal carotid arteries. Skull: Normal. Chronic deformities of the lamina papyracea bilaterally. Negative for fracture or focal lesion. Sinuses/Orbits: Unchanged left mastoid effusion. Otherwise, the remaining paranasal sinuses are clear. Left lens replacement. The globes appear intact. No retrobulbar hematoma. Other: None. IMPRESSION: 1. No acute intracranial abnormality, specifically, no acute hemorrhage, territorial infarction, or intracranial mass. 2. Left mastoid effusion, unchanged. Electronically Signed   By: Rance Burrows M.D.   On: 01/07/2024 16:13   DG Chest 1 View Result Date: 01/07/2024 CLINICAL DATA:  Near syncope. EXAM: CHEST  1 VIEW COMPARISON:  Chest radiograph dated 11/22/2023. FINDINGS: Shallow inspiration. No focal consolidation, pleural effusion,  pneumothorax. Stable cardiac silhouette. No acute osseous pathology. IMPRESSION: No active disease. Electronically Signed   By: Angus Bark M.D.   On: 01/07/2024 15:06    Procedures Procedures    Medications Ordered in ED Medications  LORazepam  (ATIVAN ) tablet 0.5 mg (0.5 mg Oral Given 01/07/24 1337)    ED Course/ Medical Decision Making/ A&P Clinical Course as of 01/07/24 1617  Wed Jan 07, 2024  1500 CBC with mild stable anemia and complete metabolic panel with creatinine increased at 1.44 slightly above baseline [DR]    Clinical Course User Index [DR] Auston Blush, MD                                 Medical Decision Making Amount and/or Complexity of Data Reviewed Radiology: ordered.  Risk Prescription drug management.   1-with prior stroke presents today with shaking of the left upper extremity.  Seizure versus tremors versus spasms.  These were not witnessed here in the ED.  Patient has had these for several years.  He received a dose of Ativan  here in the ED.  Head CT pending at this time Based on chronicity of  symptoms, doubt patient needs other workup here in the ED.  Advised to follow-up as outpatient. 2 patient complaining of low back pain.  He states this has been present since 2012.  Does not appear to have any acute etiology of pain or red flags  Care discussed with Dr. Nora Beal and patient is to be discharged if CT head shows no acute abnormalities       Final Clinical Impression(s) / ED Diagnoses Final diagnoses:  Tremor    Rx / DC Orders ED Discharge Orders     None         Auston Blush, MD 01/07/24 1617

## 2024-01-07 NOTE — ED Provider Triage Note (Signed)
 Emergency Medicine Provider Triage Evaluation Note  Luis Poole , a 83 y.o. male  was evaluated in triage.  Pt complains of tremors, felt like he was going to pass out, reports his son noted him rocking in the wheelchair.  He denies any dizziness, lightheaded, headache, shortness of breath, chest pain.  According to patient he has had left sided weakness since he had a lumbar fusion several years ago.  Review of Systems  Positive: Tremors, near syncope Negative: Fever, cp, headache  Physical Exam  BP (!) 170/100 (BP Location: Right Arm)   Pulse (!) 52   Temp 97.7 F (36.5 C)   Resp 17   Ht 5\' 7"  (1.702 m)   Wt 92.5 kg   SpO2 100%   BMI 31.95 kg/m  Gen:   Awake, no distress   Resp:  Normal effort  MSK:   Moves extremities without difficulty  Other:    Medical Decision Making  Medically screening exam initiated at 12:21 PM.  Appropriate orders placed.  Denny Flack was informed that the remainder of the evaluation will be completed by another provider, this initial triage assessment does not replace that evaluation, and the importance of remaining in the ED until their evaluation is complete.  Patient Msed by me after 10:13 hours in the [facility], labs had been ordered previously.  I added an EKG, chest x-ray, troponin in the setting of near syncope in an elderly person with comorbidities.   Tammi Boulier, PA-C 01/07/24 1225

## 2024-01-07 NOTE — ED Notes (Signed)
 [facility] contacted. Pt is "5th  on the list" for transport.

## 2024-01-07 NOTE — Discharge Instructions (Signed)
 You were seen in the emergency department for your tremor in your arm.  Your workup showed normal electrolytes and no abnormality within your brain, no signs of stroke on your exam and you did not have a tremor while you are in the emergency department.  You can follow-up with your primary doctor and I have given you a referral to neurology for further workup.  You should return to the emergency department if you have a seizure, numbness or weakness in one-sided body compared to the other or any other new or concerning symptoms.

## 2024-01-08 NOTE — ED Notes (Signed)
 Provided pt with graham crackers and peanut butter and ginger ale while waiting for ptar.

## 2024-01-22 ENCOUNTER — Other Ambulatory Visit: Payer: Self-pay | Admitting: Nurse Practitioner

## 2024-01-22 DIAGNOSIS — M5416 Radiculopathy, lumbar region: Secondary | ICD-10-CM

## 2024-02-02 ENCOUNTER — Encounter (HOSPITAL_COMMUNITY): Payer: Self-pay

## 2024-02-02 ENCOUNTER — Other Ambulatory Visit: Payer: Self-pay

## 2024-02-02 ENCOUNTER — Emergency Department (HOSPITAL_COMMUNITY)
Admission: EM | Admit: 2024-02-02 | Discharge: 2024-02-02 | Disposition: A | Discharge status: Home / Self Care | Attending: Emergency Medicine | Admitting: Emergency Medicine

## 2024-02-02 ENCOUNTER — Emergency Department (HOSPITAL_COMMUNITY)

## 2024-02-02 DIAGNOSIS — Y92009 Unspecified place in unspecified non-institutional (private) residence as the place of occurrence of the external cause: Secondary | ICD-10-CM | POA: Diagnosis not present

## 2024-02-02 DIAGNOSIS — Z7982 Long term (current) use of aspirin: Secondary | ICD-10-CM | POA: Insufficient documentation

## 2024-02-02 DIAGNOSIS — M545 Low back pain, unspecified: Secondary | ICD-10-CM | POA: Diagnosis not present

## 2024-02-02 DIAGNOSIS — I2510 Atherosclerotic heart disease of native coronary artery without angina pectoris: Secondary | ICD-10-CM | POA: Diagnosis not present

## 2024-02-02 DIAGNOSIS — W19XXXA Unspecified fall, initial encounter: Secondary | ICD-10-CM

## 2024-02-02 DIAGNOSIS — N189 Chronic kidney disease, unspecified: Secondary | ICD-10-CM | POA: Diagnosis not present

## 2024-02-02 DIAGNOSIS — E1122 Type 2 diabetes mellitus with diabetic chronic kidney disease: Secondary | ICD-10-CM | POA: Insufficient documentation

## 2024-02-02 DIAGNOSIS — Z79899 Other long term (current) drug therapy: Secondary | ICD-10-CM | POA: Insufficient documentation

## 2024-02-02 DIAGNOSIS — I129 Hypertensive chronic kidney disease with stage 1 through stage 4 chronic kidney disease, or unspecified chronic kidney disease: Secondary | ICD-10-CM | POA: Diagnosis not present

## 2024-02-02 DIAGNOSIS — M25552 Pain in left hip: Secondary | ICD-10-CM | POA: Diagnosis present
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
## 2024-02-02 NOTE — Discharge Instructions (Addendum)
 X-rays today were normal. Follow-up with your doctor. Return here for new concerns.
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
## 2024-02-02 NOTE — ED Triage Notes (Signed)
 Pt reports he was getting out of his bed and lost his footing and tripped on a rug landing on his left hip. Pt has pain to left hip and down left leg. Pt complains of lower back pain but reports it is chronic.

## 2024-03-09 ENCOUNTER — Emergency Department (HOSPITAL_COMMUNITY)
Admission: EM | Admit: 2024-03-09 | Discharge: 2024-03-10 | Disposition: A | Discharge status: Home / Self Care | Attending: Emergency Medicine | Admitting: Emergency Medicine

## 2024-03-09 ENCOUNTER — Emergency Department (HOSPITAL_COMMUNITY)

## 2024-03-09 DIAGNOSIS — E1122 Type 2 diabetes mellitus with diabetic chronic kidney disease: Secondary | ICD-10-CM | POA: Diagnosis not present

## 2024-03-09 DIAGNOSIS — Z940 Kidney transplant status: Secondary | ICD-10-CM | POA: Insufficient documentation

## 2024-03-09 DIAGNOSIS — I130 Hypertensive heart and chronic kidney disease with heart failure and stage 1 through stage 4 chronic kidney disease, or unspecified chronic kidney disease: Secondary | ICD-10-CM | POA: Diagnosis not present

## 2024-03-09 DIAGNOSIS — R519 Headache, unspecified: Secondary | ICD-10-CM | POA: Insufficient documentation

## 2024-03-09 DIAGNOSIS — Z8673 Personal history of transient ischemic attack (TIA), and cerebral infarction without residual deficits: Secondary | ICD-10-CM | POA: Insufficient documentation

## 2024-03-09 DIAGNOSIS — I509 Heart failure, unspecified: Secondary | ICD-10-CM | POA: Insufficient documentation

## 2024-03-09 DIAGNOSIS — M79605 Pain in left leg: Secondary | ICD-10-CM | POA: Diagnosis present

## 2024-03-09 DIAGNOSIS — D649 Anemia, unspecified: Secondary | ICD-10-CM | POA: Insufficient documentation

## 2024-03-09 DIAGNOSIS — N189 Chronic kidney disease, unspecified: Secondary | ICD-10-CM | POA: Diagnosis not present

## 2024-03-09 DIAGNOSIS — W19XXXA Unspecified fall, initial encounter: Secondary | ICD-10-CM | POA: Insufficient documentation

## 2024-03-09 LAB — URINALYSIS, W/ REFLEX TO CULTURE (INFECTION SUSPECTED)
Bacteria, UA: NONE SEEN
Bilirubin Urine: NEGATIVE
Glucose, UA: NEGATIVE mg/dL
Hgb urine dipstick: NEGATIVE
Ketones, ur: NEGATIVE mg/dL
Leukocytes,Ua: NEGATIVE
Nitrite: NEGATIVE
Protein, ur: NEGATIVE mg/dL
Specific Gravity, Urine: 1.004 — ABNORMAL LOW (ref 1.005–1.030)
pH: 5 (ref 5.0–8.0)

## 2024-03-09 LAB — CBC WITH DIFFERENTIAL/PLATELET
Abs Immature Granulocytes: 0.01 K/uL (ref 0.00–0.07)
Basophils Absolute: 0 K/uL (ref 0.0–0.1)
Basophils Relative: 1 %
Eosinophils Absolute: 0.1 K/uL (ref 0.0–0.5)
Eosinophils Relative: 2 %
HCT: 37.1 % — ABNORMAL LOW (ref 39.0–52.0)
Hemoglobin: 11.8 g/dL — ABNORMAL LOW (ref 13.0–17.0)
Immature Granulocytes: 0 %
Lymphocytes Relative: 32 %
Lymphs Abs: 1.4 K/uL (ref 0.7–4.0)
MCH: 27.4 pg (ref 26.0–34.0)
MCHC: 31.8 g/dL (ref 30.0–36.0)
MCV: 86.1 fL (ref 80.0–100.0)
Monocytes Absolute: 0.3 K/uL (ref 0.1–1.0)
Monocytes Relative: 7 %
Neutro Abs: 2.6 K/uL (ref 1.7–7.7)
Neutrophils Relative %: 58 %
Platelets: 171 K/uL (ref 150–400)
RBC: 4.31 MIL/uL (ref 4.22–5.81)
RDW: 14.6 % (ref 11.5–15.5)
WBC: 4.4 K/uL (ref 4.0–10.5)
nRBC: 0 % (ref 0.0–0.2)

## 2024-03-09 LAB — BASIC METABOLIC PANEL WITH GFR
Anion gap: 10 (ref 5–15)
BUN: 14 mg/dL (ref 8–23)
CO2: 21 mmol/L — ABNORMAL LOW (ref 22–32)
Calcium: 9 mg/dL (ref 8.9–10.3)
Chloride: 108 mmol/L (ref 98–111)
Creatinine, Ser: 1.1 mg/dL (ref 0.61–1.24)
GFR, Estimated: >60 mL/min (ref >60)
Glucose, Bld: 95 mg/dL (ref 70–99)
Potassium: 3.5 mmol/L (ref 3.5–5.1)
Sodium: 139 mmol/L (ref 135–145)

## 2024-03-09 MED ORDER — ACETAMINOPHEN 500 MG PO TABS
1000.0000 mg | ORAL_TABLET | Freq: Four times a day (QID) | ORAL | Status: DC | PRN
  Administered 2024-03-09: 1000 mg via ORAL
  Filled 2024-03-09: qty 2

## 2024-03-09 NOTE — Discharge Instructions (Signed)
 Your x-rays and CT scans showed no new injuries.  Your lab work is reassuring.  Please continue to follow-up with your primary care doctor.  Return with any new or suddenly worsening symptoms.
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
## 2024-03-10 NOTE — ED Notes (Signed)
 [facility] contacted for transport

## 2024-03-12 ENCOUNTER — Emergency Department (HOSPITAL_COMMUNITY)
Admission: EM | Admit: 2024-03-12 | Discharge: 2024-03-13 | Disposition: A | Discharge status: Home / Self Care | Attending: Emergency Medicine | Admitting: Emergency Medicine

## 2024-03-12 ENCOUNTER — Other Ambulatory Visit: Payer: Self-pay

## 2024-03-12 ENCOUNTER — Encounter (HOSPITAL_COMMUNITY): Payer: Self-pay | Admitting: Emergency Medicine

## 2024-03-12 DIAGNOSIS — Z7982 Long term (current) use of aspirin: Secondary | ICD-10-CM | POA: Diagnosis not present

## 2024-03-12 DIAGNOSIS — I110 Hypertensive heart disease with heart failure: Secondary | ICD-10-CM | POA: Insufficient documentation

## 2024-03-12 DIAGNOSIS — Z79899 Other long term (current) drug therapy: Secondary | ICD-10-CM | POA: Insufficient documentation

## 2024-03-12 DIAGNOSIS — I509 Heart failure, unspecified: Secondary | ICD-10-CM | POA: Insufficient documentation

## 2024-03-12 DIAGNOSIS — R42 Dizziness and giddiness: Secondary | ICD-10-CM | POA: Insufficient documentation

## 2024-03-12 DIAGNOSIS — E119 Type 2 diabetes mellitus without complications: Secondary | ICD-10-CM | POA: Diagnosis not present

## 2024-03-12 LAB — CBC WITH DIFFERENTIAL/PLATELET
Abs Immature Granulocytes: 0.01 K/uL (ref 0.00–0.07)
Basophils Absolute: 0 K/uL (ref 0.0–0.1)
Basophils Relative: 0 %
Eosinophils Absolute: 0.1 K/uL (ref 0.0–0.5)
Eosinophils Relative: 2 %
HCT: 34.3 % — ABNORMAL LOW (ref 39.0–52.0)
Hemoglobin: 11 g/dL — ABNORMAL LOW (ref 13.0–17.0)
Immature Granulocytes: 0 %
Lymphocytes Relative: 48 %
Lymphs Abs: 2.1 K/uL (ref 0.7–4.0)
MCH: 27.9 pg (ref 26.0–34.0)
MCHC: 32.1 g/dL (ref 30.0–36.0)
MCV: 87.1 fL (ref 80.0–100.0)
Monocytes Absolute: 0.5 K/uL (ref 0.1–1.0)
Monocytes Relative: 11 %
Neutro Abs: 1.6 K/uL — ABNORMAL LOW (ref 1.7–7.7)
Neutrophils Relative %: 39 %
Platelets: 164 K/uL (ref 150–400)
RBC: 3.94 MIL/uL — ABNORMAL LOW (ref 4.22–5.81)
RDW: 14.7 % (ref 11.5–15.5)
WBC: 4.3 K/uL (ref 4.0–10.5)
nRBC: 0 % (ref 0.0–0.2)

## 2024-03-12 LAB — COMPREHENSIVE METABOLIC PANEL WITH GFR
ALT: 11 U/L (ref 0–44)
AST: 19 U/L (ref 15–41)
Albumin: 3.6 g/dL (ref 3.5–5.0)
Alkaline Phosphatase: 64 U/L (ref 38–126)
Anion gap: 10 (ref 5–15)
BUN: 17 mg/dL (ref 8–23)
CO2: 22 mmol/L (ref 22–32)
Calcium: 9 mg/dL (ref 8.9–10.3)
Chloride: 107 mmol/L (ref 98–111)
Creatinine, Ser: 1.41 mg/dL — ABNORMAL HIGH (ref 0.61–1.24)
GFR, Estimated: 50 mL/min — ABNORMAL LOW (ref >60)
Glucose, Bld: 96 mg/dL (ref 70–99)
Potassium: 3.7 mmol/L (ref 3.5–5.1)
Sodium: 139 mmol/L (ref 135–145)
Total Bilirubin: 0.8 mg/dL (ref 0.0–1.2)
Total Protein: 6.8 g/dL (ref 6.5–8.1)

## 2024-03-12 NOTE — Discharge Instructions (Signed)
 You were seen in the emergency department for dizziness again Your dizziness resolved after resting in the ED here Your blood work and EKG looked okay Is importantly follow-up with your primary care doctor to discuss today's visit and your ongoing dizziness Return to the emergency room for severe dizziness, headaches, weakness on one side of your body or any other concerns Be sure you are keeping well-hydrated especially as it will be very hot over the upcoming days

## 2024-03-12 NOTE — ED Triage Notes (Signed)
 Pt BIB EMS from home, c/o ongoing dizzy x 3 days. Treated here recently with meclizine  with no relief. Pt has left side weakness at baseline and unsteady gait.   BP 108/67 P 60 SpO2 98% CBG 109

## 2024-03-12 NOTE — ED Notes (Signed)
 [facility] notified for transport

## 2024-03-12 NOTE — ED Provider Notes (Signed)
 [place] EMERGENCY DEPARTMENT AT [facility] Provider Note   CSN: [id number] Arrival date & time: 03/12/24  1813     Patient presents with: Dizziness   Luis Poole is a 83 y.o. male.  With a history of CVA, type 2 diabetes heart failure and hypertension presents to the ED given concern for dizziness.  Patient has experienced intermittent dizziness for the last 3 days.  He was seen in the Emergency Department yesterday and discharged with meclizine .  Today he was sitting outside in the heat and came inside and felt dizzy.  The dizziness did not go away after meclizine  prompting him to call 911.  Since coming to the ED his dizziness has resolved.  No chest pain shortness of breath nausea vomiting or abdominal pain.  Has been eating well and keeping hydrated.    Dizziness      Prior to Admission medications   Medication Sig Start Date End Date Taking? Authorizing Provider  allopurinol  (ZYLOPRIM ) 100 MG tablet Take 100 mg by mouth daily.    [provider]  amLODipine  (NORVASC ) 10 MG tablet Take 0.5 tablets (5 mg total) by mouth daily. 08/12/23   Danford, Lonni SHAUNNA, MD  aspirin  EC 81 MG tablet Take 81 mg by mouth daily.    [provider]  atorvastatin  (LIPITOR) 20 MG tablet Take 1 tablet (20 mg total) by mouth daily. Patient taking differently: Take 20 mg by mouth daily as needed (High Cholesterol). 10/09/17   Jillian Buttery, MD  colchicine  0.6 MG tablet Take 1 tablet (0.6 mg total) daily by mouth. Patient taking differently: Take 0.6 mg by mouth daily as needed (gout pain). 06/29/17   Dolan Mateo Larger, MD  ferrous sulfate  325 (65 FE) MG tablet Take 325 mg by mouth daily with breakfast.    [provider]  meclizine  (ANTIVERT ) 12.5 MG tablet Take 1 tablet (12.5 mg total) by mouth 3 (three) times daily as needed for dizziness. 08/02/23   Ula Prentice SAUNDERS, MD  polyethylene glycol (MIRALAX  / GLYCOLAX ) 17 g packet Take 17 g by mouth daily as  needed for mild constipation. 11/29/22   Rizwan, Saima, MD  valsartan  (DIOVAN ) 160 MG tablet Take 1 tablet (160 mg total) by mouth daily. 04/10/23   Lucien Orren SAILOR, PA-C    Allergies: Ibuprofen , Lisinopril, Pregabalin , and Gabapentin     Review of Systems  Neurological:  Positive for dizziness.    Updated Vital Signs BP 139/71   Pulse 62   Temp 98.1 F (36.7 C) (Oral)   Resp 15   SpO2 100%   Physical Exam Vitals and nursing note reviewed.  HENT:     Head: Normocephalic and atraumatic.  Eyes:     Pupils: Pupils are equal, round, and reactive to light.  Cardiovascular:     Rate and Rhythm: Normal rate and regular rhythm.  Pulmonary:     Effort: Pulmonary effort is normal.     Breath sounds: Normal breath sounds.  Abdominal:     Palpations: Abdomen is soft.     Tenderness: There is no abdominal tenderness.  Skin:    General: Skin is warm and dry.  Neurological:     General: No focal deficit present.     Mental Status: He is alert.     Sensory: No sensory deficit.     Motor: No weakness.  Psychiatric:        Mood and Affect: Mood normal.     (all labs ordered are listed, but  only abnormal results are displayed) Labs Reviewed  COMPREHENSIVE METABOLIC PANEL WITH GFR - Abnormal; Notable for the following components:      Result Value   Creatinine, Ser 1.41 (*)    GFR, Estimated 50 (*)    All other components within normal limits  CBC WITH DIFFERENTIAL/PLATELET - Abnormal; Notable for the following components:   RBC 3.94 (*)    Hemoglobin 11.0 (*)    HCT 34.3 (*)    Neutro Abs 1.6 (*)    All other components within normal limits    EKG: EKG Interpretation Date/Time:  Friday March 12 2024 18:33:30 EDT Ventricular Rate:  61 PR Interval:  198 QRS Duration:  162 QT Interval:  450 QTC Calculation: 454 R Axis:   38  Text Interpretation: Sinus rhythm Right bundle branch block Confirmed by Pamella Sharper [phone]) on 03/12/2024 7:50:38 PM  Radiology: No results  found.   Procedures   Medications Ordered in the ED - No data to display                                  Medical Decision Making 83 year old male with history as above returns for stated complaint of dizziness.  Was seen yesterday for the complaint discharge with meclizine .  Dizzy after sitting outside in the heat today.  Dizziness has resolved here.  No other new neurologic symptoms.  Laboratory workup unremarkable.  EKG without evidence of dysrhythmia.  Patient feels much better after resting and eating here.  Appropriate for discharge.  Return precautions were discussed in detail  Amount and/or Complexity of Data Reviewed Labs: ordered.        Final diagnoses:  Dizziness    ED Discharge Orders     None          Pamella Sharper LABOR, DO 03/12/24 2040

## 2024-03-13 NOTE — ED Notes (Signed)
 Followed up with [facility] about patient's place on list to get picked up.

## 2024-03-13 NOTE — ED Notes (Signed)
 Called [facility] to follow on patient's transportation, state will be a wait to get picked up.

## 2024-03-21 ENCOUNTER — Emergency Department (HOSPITAL_BASED_OUTPATIENT_CLINIC_OR_DEPARTMENT_OTHER)

## 2024-03-21 ENCOUNTER — Other Ambulatory Visit: Payer: Self-pay

## 2024-03-21 ENCOUNTER — Emergency Department (HOSPITAL_BASED_OUTPATIENT_CLINIC_OR_DEPARTMENT_OTHER)
Admission: EM | Admit: 2024-03-21 | Discharge: 2024-03-21 | Disposition: A | Discharge status: Home / Self Care | Attending: Emergency Medicine | Admitting: Emergency Medicine

## 2024-03-21 ENCOUNTER — Encounter (HOSPITAL_BASED_OUTPATIENT_CLINIC_OR_DEPARTMENT_OTHER): Payer: Self-pay | Admitting: *Deleted

## 2024-03-21 DIAGNOSIS — M545 Low back pain, unspecified: Secondary | ICD-10-CM | POA: Diagnosis not present

## 2024-03-21 DIAGNOSIS — Z7982 Long term (current) use of aspirin: Secondary | ICD-10-CM | POA: Diagnosis not present

## 2024-03-21 DIAGNOSIS — M25552 Pain in left hip: Secondary | ICD-10-CM | POA: Diagnosis present

## 2024-03-21 DIAGNOSIS — I509 Heart failure, unspecified: Secondary | ICD-10-CM | POA: Diagnosis not present

## 2024-03-21 DIAGNOSIS — N189 Chronic kidney disease, unspecified: Secondary | ICD-10-CM | POA: Diagnosis not present

## 2024-03-21 MED ORDER — ACETAMINOPHEN 500 MG PO TABS
1000.0000 mg | ORAL_TABLET | Freq: Once | ORAL | Status: AC
  Administered 2024-03-21: 1000 mg via ORAL
  Filled 2024-03-21: qty 2

## 2024-03-21 MED ORDER — LIDOCAINE 5 % EX PTCH: 1.0000 | MEDICATED_PATCH | CUTANEOUS | 0 refills | Status: DC

## 2024-03-21 MED ORDER — METHYLPREDNISOLONE 4 MG PO TBPK
ORAL_TABLET | ORAL | 0 refills | Status: DC
Start: 2024-03-21 — End: 2024-06-08

## 2024-03-21 MED ORDER — LIDOCAINE 5 % EX PTCH
1.0000 | MEDICATED_PATCH | CUTANEOUS | Status: DC
  Administered 2024-03-21: 1 via TRANSDERMAL
  Filled 2024-03-21: qty 1

## 2024-03-21 MED ORDER — CYCLOBENZAPRINE HCL 5 MG PO TABS: 5.0000 mg | ORAL_TABLET | Freq: Two times a day (BID) | ORAL | 0 refills | Status: DC | PRN
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
## 2024-03-21 NOTE — ED Triage Notes (Signed)
 Pt to ED via EMS reporting left hip pain since early 2000s that has worsened after a fall last week. Patient was seen in ED for that fall.

## 2024-03-21 NOTE — ED Notes (Signed)
Pt transported to CT via bed.

## 2024-03-21 NOTE — Discharge Instructions (Signed)
 Follow-up with orthopedics.  Take medications as prescribed.  Flexeril  is a muscle relaxant.  This medication is mildly sedating.  Please be careful with its use.

## 2024-03-21 NOTE — ED Notes (Signed)
 Pt verbalized decrease in pain. Pt noted to be falling asleep easily and very tired. Pt verbalized he has not slept in 2 days from the pain and now that the pain has decreased he is very tired. RN helped patient dress and explained discharge instructions. Pt called daughter for transport home.

## 2024-03-30 ENCOUNTER — Emergency Department (HOSPITAL_BASED_OUTPATIENT_CLINIC_OR_DEPARTMENT_OTHER)
Admission: EM | Admit: 2024-03-30 | Discharge: 2024-03-30 | Disposition: A | Discharge status: Home / Self Care | Attending: Emergency Medicine | Admitting: Emergency Medicine

## 2024-03-30 ENCOUNTER — Encounter (HOSPITAL_BASED_OUTPATIENT_CLINIC_OR_DEPARTMENT_OTHER): Payer: Self-pay

## 2024-03-30 ENCOUNTER — Other Ambulatory Visit: Payer: Self-pay

## 2024-03-30 DIAGNOSIS — Z8673 Personal history of transient ischemic attack (TIA), and cerebral infarction without residual deficits: Secondary | ICD-10-CM | POA: Diagnosis not present

## 2024-03-30 DIAGNOSIS — M549 Dorsalgia, unspecified: Secondary | ICD-10-CM | POA: Diagnosis not present

## 2024-03-30 DIAGNOSIS — Z7982 Long term (current) use of aspirin: Secondary | ICD-10-CM | POA: Insufficient documentation

## 2024-03-30 DIAGNOSIS — M25552 Pain in left hip: Secondary | ICD-10-CM | POA: Insufficient documentation

## 2024-03-30 NOTE — ED Provider Notes (Signed)
 [place] EMERGENCY DEPARTMENT AT [facility] Provider Note   CSN: [id number] Arrival date & time: 03/30/24  0045     Patient presents with: Back Pain   Luis Poole is a 83 y.o. male.   Patient is an 83 year old male brought by EMS for evaluation of left hip and back pain.  This has been ongoing for greater than 12 years since he had a stroke affecting his left side.  He denies any new injury or trauma.  Pain is worse with movement and occasionally feels a grinding sensation in his hip.  No alleviating factors.  Patient was here with a similar presentation on August 3.  He had CT scans performed of his back and hip, both of which were negative for fracture.       Prior to Admission medications   Medication Sig Start Date End Date Taking? Authorizing Provider  allopurinol  (ZYLOPRIM ) 100 MG tablet Take 100 mg by mouth daily.    [provider]  amLODipine  (NORVASC ) 10 MG tablet Take 0.5 tablets (5 mg total) by mouth daily. 08/12/23   Danford, Lonni SHAUNNA, MD  aspirin  EC 81 MG tablet Take 81 mg by mouth daily.    [provider]  atorvastatin  (LIPITOR) 20 MG tablet Take 1 tablet (20 mg total) by mouth daily. Patient taking differently: Take 20 mg by mouth daily as needed (High Cholesterol). 10/09/17   Jillian Buttery, MD  colchicine  0.6 MG tablet Take 1 tablet (0.6 mg total) daily by mouth. Patient taking differently: Take 0.6 mg by mouth daily as needed (gout pain). 06/29/17   Dolan Mateo Larger, MD  cyclobenzaprine  (FLEXERIL ) 5 MG tablet Take 1 tablet (5 mg total) by mouth 2 (two) times daily as needed for muscle spasms. 03/21/24   Curatolo, Adam, DO  ferrous sulfate  325 (65 FE) MG tablet Take 325 mg by mouth daily with breakfast.    [provider]  meclizine  (ANTIVERT ) 12.5 MG tablet Take 1 tablet (12.5 mg total) by mouth 3 (three) times daily as needed for dizziness. 08/02/23   Ula Prentice SAUNDERS, MD  methylPREDNISolone  (MEDROL  DOSEPAK) 4 MG  TBPK tablet Follow package insert 03/21/24   Curatolo, Adam, DO  polyethylene glycol (MIRALAX  / GLYCOLAX ) 17 g packet Take 17 g by mouth daily as needed for mild constipation. 11/29/22   Rizwan, Saima, MD  valsartan  (DIOVAN ) 160 MG tablet Take 1 tablet (160 mg total) by mouth daily. 04/10/23   Lucien Orren SAILOR, PA-C    Allergies: Ibuprofen , Lisinopril, Pregabalin , and Gabapentin     Review of Systems  All other systems reviewed and are negative.   Updated Vital Signs BP (!) 143/76   Pulse 70   Temp 98.2 F (36.8 C)   Resp 15   SpO2 100%   Physical Exam Vitals and nursing note reviewed.  Constitutional:      Appearance: Normal appearance.  Pulmonary:     Effort: Pulmonary effort is normal.  Musculoskeletal:     Comments: The left hip appears grossly normal.  He has good range of motion, but with some discomfort.  He is able to move his leg without difficulty.  Sensation is intact to the leg.  Skin:    General: Skin is warm and dry.  Neurological:     Mental Status: He is alert.     (all labs ordered are listed, but only abnormal results are displayed) Labs Reviewed - No data to display  EKG: None  Radiology: No results found.  Procedures   Medications Ordered in the ED - No data to display                                  Medical Decision Making  This patient is hip pain is chronic in nature.  He just underwent CT scan 1 week ago for the same.  I do not feel as though any further imaging is indicated.  Patient to be discharged with follow-up with primary doctor.     Final diagnoses:  None    ED Discharge Orders     None          Geroldine Berg, MD 03/30/24 [phone]

## 2024-03-30 NOTE — Discharge Instructions (Signed)
 Follow-up with your primary doctor to discuss your ongoing hip discomfort.
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
## 2024-03-31 ENCOUNTER — Other Ambulatory Visit: Payer: Self-pay

## 2024-03-31 ENCOUNTER — Emergency Department (HOSPITAL_COMMUNITY)

## 2024-03-31 ENCOUNTER — Emergency Department (HOSPITAL_COMMUNITY)
Admission: EM | Admit: 2024-03-31 | Discharge: 2024-03-31 | Disposition: A | Discharge status: Home / Self Care | Attending: Emergency Medicine | Admitting: Emergency Medicine

## 2024-03-31 ENCOUNTER — Encounter (HOSPITAL_COMMUNITY): Payer: Self-pay

## 2024-03-31 DIAGNOSIS — Z8673 Personal history of transient ischemic attack (TIA), and cerebral infarction without residual deficits: Secondary | ICD-10-CM | POA: Diagnosis not present

## 2024-03-31 DIAGNOSIS — M79662 Pain in left lower leg: Secondary | ICD-10-CM

## 2024-03-31 DIAGNOSIS — G8929 Other chronic pain: Secondary | ICD-10-CM

## 2024-03-31 DIAGNOSIS — Z7982 Long term (current) use of aspirin: Secondary | ICD-10-CM | POA: Diagnosis not present

## 2024-03-31 DIAGNOSIS — M25552 Pain in left hip: Secondary | ICD-10-CM | POA: Diagnosis present

## 2024-03-31 DIAGNOSIS — I509 Heart failure, unspecified: Secondary | ICD-10-CM | POA: Diagnosis not present

## 2024-03-31 MED ORDER — LIDOCAINE 5 % EX PTCH: 1.0000 | MEDICATED_PATCH | CUTANEOUS | 0 refills | Status: AC

## 2024-03-31 MED ORDER — HYDROCODONE-ACETAMINOPHEN 5-325 MG PO TABS
1.0000 | ORAL_TABLET | Freq: Once | ORAL | Status: AC
  Administered 2024-03-31 (×2): 1 via ORAL
  Filled 2024-03-31: qty 1

## 2024-03-31 MED ORDER — HYDROCODONE-ACETAMINOPHEN 5-325 MG PO TABS: 1.0000 | ORAL_TABLET | Freq: Four times a day (QID) | ORAL | 0 refills | Status: DC | PRN

## 2024-03-31 NOTE — Progress Notes (Signed)
 Left lower extremity venous duplex has been completed. Preliminary results can be found in CV Proc through chart review.  Results were given to Norleen Essex PA.  03/31/24 2:50 PM Cathlyn Collet RVT

## 2024-03-31 NOTE — ED Notes (Signed)
 Pt reports he will need to go home by [facility].

## 2024-03-31 NOTE — ED Notes (Signed)
 Pt c/o LLE pain and weakness since 2012.  Pt reports he has been seen by specialists w/o diagnosis.

## 2024-03-31 NOTE — Group Note (Deleted)
 Date:  03/31/2024 Time:  2:21 PM  Group Topic/Focus:  Wellness Toolbox:   The focus of this group is to discuss various aspects of wellness, balancing those aspects and exploring ways to increase the ability to experience wellness.  Patients will create a wellness toolbox for use upon discharge.     Participation Level:  {BHH PARTICIPATION OZCZO:77735}  Participation Quality:  {BHH PARTICIPATION QUALITY:22265}  Affect:  {BHH AFFECT:22266}  Cognitive:  {BHH COGNITIVE:22267}  Insight: {BHH Insight2:20797}  Engagement in Group:  {BHH ENGAGEMENT IN HMNLE:77731}  Modes of Intervention:  {BHH MODES OF INTERVENTION:22269}  Additional Comments:  ***  Myra Curtistine BROCKS 03/31/2024, 2:21 PM

## 2024-03-31 NOTE — ED Notes (Signed)
 Pt is upset that we have been unable to determine what is wrong and fix it.  Pt reminded this has been going on since 2012 and he needs to follow-up with Ortho.  Pt reports he has seen every doctor he knows of and none of them have helped.  Pt reminded the ED rules out emergencies and then refers patients to specialists.  Pt is unhappy with this information.  Pt reminded that he should be using his wheelchair.
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
## 2024-03-31 NOTE — Discharge Instructions (Addendum)
 Follow up with Ortho for hip and back pain. I would also recommend recheck of symptoms with PCP. Return to ER with new or worsening symptoms.  In the meantime I would recommend taking the steroids as prescribed.  Use lidocaine  patch.  Alternate Tylenol  and ibuprofen .  You can take Norco for breakthrough pain.

## 2024-03-31 NOTE — ED Triage Notes (Signed)
 Pt BIB [facility] after he had a mechanical fall while trying to transfer from bed to wheelchair. Pt states that the wheelchair wasn't locked causing him to slide to floor. Pt complains of left hip pain, but states that this pain is also chronic. EMS reports taht pt was hesitant about coming to hospital, but son and son's gf insisted pt come.

## 2024-03-31 NOTE — ED Notes (Signed)
 [facility] called, 7th in line

## 2024-03-31 NOTE — ED Provider Triage Note (Signed)
 Emergency Medicine Provider Triage Evaluation Note  Luis Poole , a 83 y.o. male  was evaluated in triage.  Pt complains of left hip pain.  He states he has chronic left hip pain but fell from a wheelchair earlier this evening increasing his pain.  Review of Systems  Positive:  Negative:   Physical Exam  BP 131/78 (BP Location: Right Arm)   Pulse 100   Temp 98.3 F (36.8 C)   Resp 17   SpO2 100%  Gen:   Awake, no distress   Resp:  Normal effort  MSK:   Moves extremities without difficulty  Other:    Medical Decision Making  Medically screening exam initiated at 1:46 AM.  Appropriate orders placed.  Luis Poole was informed that the remainder of the evaluation will be completed by another provider, this initial triage assessment does not replace that evaluation, and the importance of remaining in the ED until their evaluation is complete.     Logan Ubaldo NOVAK, PA-C 03/31/24 [phone]

## 2024-03-31 NOTE — ED Provider Notes (Signed)
  Physical Exam  BP (!) 149/89   Pulse 61   Temp 97.6 F (36.4 C) (Oral)   Resp 16   Ht 5' 7 (1.702 m)   Wt 92.5 kg   SpO2 100%   BMI 31.95 kg/m   Physical Exam Cardiovascular:     Rate and Rhythm: Normal rate and regular rhythm.  Pulmonary:     Effort: Pulmonary effort is normal.     Breath sounds: Normal breath sounds.  Abdominal:     General: Abdomen is flat. Bowel sounds are normal.     Palpations: Abdomen is soft.  Musculoskeletal:     Right lower leg: No edema.     Left lower leg: No edema.     Procedures  Procedures  ED Course / MDM   Clinical Course as of 03/31/24 1544  Wed Mar 31, 2024  1500 Negative DVT study (prelim read) [JR]  1518 Waiting for CT hip. If normal and patient can walk okay to go home.  [JB]    Clinical Course User Index [JB] Eber Ferrufino, Warren SAILOR, PA-C [JR] Lang Norleen POUR, PA-C   Medical Decision Making Amount and/or Complexity of Data Reviewed Radiology: ordered.  Risk Prescription drug management.   Patient was received from prior ED provider at shift handoff John PA-C.  In short patient is coming in with acute on chronic left hip pain after fall this Sunday.  He is localizing his pain to the left lateral hip and left low back.  Plan is to obtain CT of left hip to rule out occult fracture.  If otherwise normal and patient is ambulatory okay to go home.  CT left hip shows no acute findings.  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  On reexam patient reports he is still having pain but does feel it has decreased after pain medicine here.  He reports that he primarily ambulates with a 4 point walker or in wheelchair at home.  Reports he has not been taking steroids that were recently prescribed, he will start taking them. He lives with his son who helps take care of him.  Feel symptoms are most likely arthritis versus low back with left sided sciatica, he does follow with ortho, he agrees to schedule follow up  appointment and see primary care as well. He does not have any red flag symptoms associated with his back pain.  Vitals have been stable here.  Feel he is appropriate for discharge with outpatient follow-up.       Laquana Villari N, PA-C 03/31/24 1726    Albertina Dixon, MD 04/01/24 [phone]

## 2024-03-31 NOTE — ED Notes (Signed)
 Patient transported to CT

## 2024-03-31 NOTE — ED Notes (Signed)
 US  at bedside.

## 2024-04-03 ENCOUNTER — Emergency Department (HOSPITAL_COMMUNITY)

## 2024-04-03 ENCOUNTER — Encounter (HOSPITAL_COMMUNITY): Payer: Self-pay | Admitting: *Deleted

## 2024-04-03 ENCOUNTER — Emergency Department (HOSPITAL_COMMUNITY)
Admission: EM | Admit: 2024-04-03 | Discharge: 2024-04-03 | Disposition: A | Discharge status: Home / Self Care | Attending: Emergency Medicine | Admitting: Emergency Medicine

## 2024-04-03 ENCOUNTER — Other Ambulatory Visit: Payer: Self-pay

## 2024-04-03 DIAGNOSIS — Z7982 Long term (current) use of aspirin: Secondary | ICD-10-CM | POA: Diagnosis not present

## 2024-04-03 DIAGNOSIS — W1830XA Fall on same level, unspecified, initial encounter: Secondary | ICD-10-CM | POA: Insufficient documentation

## 2024-04-03 DIAGNOSIS — I130 Hypertensive heart and chronic kidney disease with heart failure and stage 1 through stage 4 chronic kidney disease, or unspecified chronic kidney disease: Secondary | ICD-10-CM | POA: Insufficient documentation

## 2024-04-03 DIAGNOSIS — N183 Chronic kidney disease, stage 3 unspecified: Secondary | ICD-10-CM | POA: Insufficient documentation

## 2024-04-03 DIAGNOSIS — R600 Localized edema: Secondary | ICD-10-CM | POA: Insufficient documentation

## 2024-04-03 DIAGNOSIS — E1122 Type 2 diabetes mellitus with diabetic chronic kidney disease: Secondary | ICD-10-CM | POA: Diagnosis not present

## 2024-04-03 DIAGNOSIS — I5032 Chronic diastolic (congestive) heart failure: Secondary | ICD-10-CM | POA: Diagnosis not present

## 2024-04-03 DIAGNOSIS — Z87891 Personal history of nicotine dependence: Secondary | ICD-10-CM | POA: Diagnosis not present

## 2024-04-03 DIAGNOSIS — Z79899 Other long term (current) drug therapy: Secondary | ICD-10-CM | POA: Insufficient documentation

## 2024-04-03 DIAGNOSIS — Z8673 Personal history of transient ischemic attack (TIA), and cerebral infarction without residual deficits: Secondary | ICD-10-CM | POA: Insufficient documentation

## 2024-04-03 DIAGNOSIS — M7989 Other specified soft tissue disorders: Secondary | ICD-10-CM | POA: Diagnosis not present

## 2024-04-03 DIAGNOSIS — W19XXXA Unspecified fall, initial encounter: Secondary | ICD-10-CM

## 2024-04-03 DIAGNOSIS — G8929 Other chronic pain: Secondary | ICD-10-CM

## 2024-04-03 DIAGNOSIS — M79605 Pain in left leg: Secondary | ICD-10-CM | POA: Diagnosis present

## 2024-04-03 MED ORDER — ACETAMINOPHEN 325 MG PO TABS
650.0000 mg | ORAL_TABLET | Freq: Once | ORAL | Status: AC
  Administered 2024-04-03: 650 mg via ORAL
  Filled 2024-04-03: qty 2

## 2024-04-03 MED ORDER — OXYCODONE HCL 5 MG PO TABS
5.0000 mg | ORAL_TABLET | Freq: Once | ORAL | Status: AC
  Administered 2024-04-03: 5 mg via ORAL
  Filled 2024-04-03: qty 1

## 2024-04-03 MED ORDER — OXYCODONE-ACETAMINOPHEN 5-325 MG PO TABS: 1.0000 | ORAL_TABLET | Freq: Three times a day (TID) | ORAL | 0 refills | Status: DC | PRN

## 2024-04-03 MED ORDER — HYDROCODONE-ACETAMINOPHEN 5-325 MG PO TABS
1.0000 | ORAL_TABLET | Freq: Once | ORAL | Status: AC
  Administered 2024-04-03: 1 via ORAL
  Filled 2024-04-03: qty 1

## 2024-04-03 NOTE — ED Notes (Signed)
 Patient transported to X-ray

## 2024-04-03 NOTE — ED Notes (Signed)
 Pt leaving in care of ptar in no new onset distress. Paper work in hand given to [employer]. No questions or concerns at this time.

## 2024-04-03 NOTE — Progress Notes (Signed)
 LLE venous duplex has been completed.  Preliminary results given to Dr. Elnor.   Results can be found under chart review under CV PROC. 04/03/2024 11:30 AM Missouri Lapaglia RVT, RDMS

## 2024-04-03 NOTE — ED Notes (Signed)
 [facility] called

## 2024-04-03 NOTE — ED Provider Notes (Signed)
 [place] [facility] Provider Note  CSN: [id number] Arrival date & time: 04/03/24 9196  Chief Complaint(s) Fall  HPI Luis Poole is a 83 y.o. male with past medical history as below, significant for PAD, CHF, foot drop left, cva who presents to the ED with complaint of left leg swelling.  Patient reports that he was seen 2 or 3 days ago, he has been having worsening swelling to his left leg family is worried about a blood clot.  He has pain to his left ankle and left knee that is chronic but seems to be mildly worsened from baseline.  He has no respiratory complaints, no chest pain.  Right leg is unremarkable.  He was seen in the ER on 8/13 had reassuring CT of his hip and reassuring DVT study.  Feels the swelling is worsening since he was last in the ER.  Take Tylenol  this morning with moderate relief of his symptoms.  Patient reports another fall 2 days ago after recent dc and is having some ongoing pain to his left leg  Past Medical History Past Medical History:  Diagnosis Date   Anemia, unspecified 06/08/2013   Cataract    CHF (congestive heart failure) (HCC)    Chronic kidney disease    Diabetes mellitus    Foot drop, left foot    AFO   Hypertension    PAD (peripheral artery disease) (HCC)    S/P lumbar fusion    Sequelae of cerebral infarction    Stroke [facility])    Patient Active Problem List   Diagnosis Date Noted   Debility 12/25/2023   Lightheadedness 12/24/2023   Coronary artery disease involving native coronary artery of native heart without angina pectoris 08/12/2023   Pressure injury of skin 08/12/2023   Hypokalemia 08/12/2023   AKI (acute kidney injury) (HCC) 08/11/2023   Near syncope 11/26/2022   Syncope 10/06/2017   Chest pain 10/06/2017   Low back pain 06/28/2017   Leg swelling 08/23/2013   Left leg swelling 08/23/2013   Neuropathy 08/23/2013   Acute gout 08/23/2013   D-dimer, elevated 08/23/2013   CKD (chronic kidney  disease), stage III (HCC) 08/23/2013   Chronic diastolic heart failure (HCC) 08/23/2013   Normocytic anemia 06/08/2013   Hypertension 08/29/2012   Diabetes mellitus type 2 in nonobese (HCC) 08/29/2012   History of CVA (cerebrovascular accident) 08/28/2012    Class: Acute   L-S radiculopathy 08/28/2012   Lumbar post-laminectomy syndrome 01/15/2012   Thoracic or lumbosacral neuritis or radiculitis, unspecified 01/15/2012   Left hemiparesis (HCC) 01/15/2012   Diabetic peripheral neuropathy (HCC) 01/15/2012   Home Medication(s) Prior to Admission medications   Medication Sig Start Date End Date Taking? Authorizing Provider  oxyCODONE -acetaminophen  (PERCOCET/ROXICET) 5-325 MG tablet Take 1 tablet by mouth every 8 (eight) hours as needed for severe pain (pain score 7-10). 04/03/24  Yes Elnor Savant A, DO  allopurinol  (ZYLOPRIM ) 100 MG tablet Take 100 mg by mouth daily.    [provider]  amLODipine  (NORVASC ) 10 MG tablet Take 0.5 tablets (5 mg total) by mouth daily. 08/12/23   Danford, Lonni SHAUNNA, MD  aspirin  EC 81 MG tablet Take 81 mg by mouth daily.    [provider]  atorvastatin  (LIPITOR) 20 MG tablet Take 1 tablet (20 mg total) by mouth daily. Patient taking differently: Take 20 mg by mouth daily as needed (High Cholesterol). 10/09/17   Jillian Buttery, MD  colchicine  0.6 MG tablet Take 1 tablet (0.6 mg  total) daily by mouth. Patient taking differently: Take 0.6 mg by mouth daily as needed (gout pain). 06/29/17   Dolan Mateo Larger, MD  cyclobenzaprine  (FLEXERIL ) 5 MG tablet Take 1 tablet (5 mg total) by mouth 2 (two) times daily as needed for muscle spasms. 03/21/24   Curatolo, Adam, DO  ferrous sulfate  325 (65 FE) MG tablet Take 325 mg by mouth daily with breakfast.    [provider]  lidocaine  (LIDODERM ) 5 % Place 1 patch onto the skin daily. Remove & Discard patch within 12 hours or as directed by MD 03/31/24   Barrett, Jamie N, PA-C  meclizine  (ANTIVERT )  12.5 MG tablet Take 1 tablet (12.5 mg total) by mouth 3 (three) times daily as needed for dizziness. 08/02/23   Ula Prentice SAUNDERS, MD  methylPREDNISolone  (MEDROL  DOSEPAK) 4 MG TBPK tablet Follow package insert 03/21/24   Curatolo, Adam, DO  polyethylene glycol (MIRALAX  / GLYCOLAX ) 17 g packet Take 17 g by mouth daily as needed for mild constipation. 11/29/22   Rizwan, Saima, MD  valsartan  (DIOVAN ) 160 MG tablet Take 1 tablet (160 mg total) by mouth daily. 04/10/23   Lucien Orren SAILOR, PA-C                                                                                                                                    Past Surgical History Past Surgical History:  Procedure Laterality Date   CATARACT EXTRACTION     EYE SURGERY     KNEE SURGERY  2006   NECK SURGERY  2012   SPINE SURGERY  2009   TONSILECTOMY, ADENOIDECTOMY, BILATERAL MYRINGOTOMY AND TUBES     Family History Family History  Problem Relation Age of Onset   Stroke Mother    Heart attack Maternal Grandfather    Heart attack Paternal Grandfather     Social History Social History   Tobacco Use   Smoking status: Former   Smokeless tobacco: Former  Building services engineer status: Never Used  Substance Use Topics   Alcohol use: No   Drug use: No   Allergies Ibuprofen , Lisinopril, Pregabalin , and Gabapentin   Review of Systems A thorough review of systems was obtained and all systems are negative except as noted in the HPI and PMH.   Physical Exam Vital Signs  I have reviewed the triage vital signs BP (!) 178/96 (BP Location: Right Arm)   Pulse (!) 55   Temp (!) 95.7 F (35.4 C) (Axillary)   Resp 18   Ht 5' 7 (1.702 m)   Wt 92.5 kg   SpO2 98%   BMI 31.94 kg/m  Physical Exam Vitals and nursing note reviewed.  Constitutional:      General: He is not in acute distress.    Appearance: Normal appearance. He is well-developed. He is not ill-appearing.  HENT:     Head: Normocephalic and atraumatic.     Right Ear:  External  ear normal.     Left Ear: External ear normal.     Nose: Nose normal.     Mouth/Throat:     Mouth: Mucous membranes are moist.  Eyes:     General: No scleral icterus.       Right eye: No discharge.        Left eye: No discharge.  Cardiovascular:     Rate and Rhythm: Normal rate.  Pulmonary:     Effort: Pulmonary effort is normal. No respiratory distress.     Breath sounds: No stridor.  Abdominal:     General: Abdomen is flat. There is no distension.     Palpations: Abdomen is soft.     Tenderness: There is no guarding.  Musculoskeletal:        General: No deformity.     Cervical back: No rigidity.       Back:       Legs:     Comments: Mild edema left lower extremity.  LE NVI Full active range of motion to left lower extremity Brace to left ankle was removed  Skin:    General: Skin is warm and dry.     Coloration: Skin is not cyanotic, jaundiced or pale.  Neurological:     Mental Status: He is alert and oriented to person, place, and time.     GCS: GCS eye subscore is 4. GCS verbal subscore is 5. GCS motor subscore is 6.  Psychiatric:        Speech: Speech normal.        Behavior: Behavior normal. Behavior is cooperative.     ED Results and Treatments Labs (all labs ordered are listed, but only abnormal results are displayed) Labs Reviewed - No data to display                                                                                                                        Radiology DG Lumbar Spine Complete Result Date: 04/03/2024 CLINICAL DATA:  Low back pain EXAM: LUMBAR SPINE - COMPLETE 4+ VIEW COMPARISON:  March 31, 2024 CT FINDINGS: Unchanged L5-S1 posterior spinal fixation hardware. Partially imaged thoracic spine stimulator hardware. No acute fractures are identified. Vertebral body heights are maintained. Unchanged mild multilevel intervertebral disc space narrowing with endplate osteophytosis and facet arthrosis. IMPRESSION: No acute findings. Unchanged  L5-S1 posterior spinal fixation hardware. Electronically Signed   By: Michaeline Blanch M.D.   On: 04/03/2024 14:09   VAS US  LOWER EXTREMITY VENOUS (DVT) (7a-7p) Result Date: 04/03/2024  Lower Venous DVT Study Patient Name:  Luis Poole  Date of Exam:   04/03/2024 Medical Rec #: [id number]      Accession #:    [id number] Date of Birth: [birth year]       Patient Gender: M Patient Age:   72 years Exam Location:  [facility] Procedure:      VAS US  LOWER EXTREMITY VENOUS (DVT) Referring Phys: JAYSON PEREYRA --------------------------------------------------------------------------------  Indications:  Edema, and Pain. Other Indications: Fall on 04/01/2024. Comparison Study: Previous exam on 03/31/2024 was negative for DVT Performing Technologist: Ezzie Potters RVT, RDMS  Examination Guidelines: A complete evaluation includes B-mode imaging, spectral Doppler, color Doppler, and power Doppler as needed of all accessible portions of each vessel. Bilateral testing is considered an integral part of a complete examination. Limited examinations for reoccurring indications may be performed as noted. The reflux portion of the exam is performed with the patient in reverse Trendelenburg.  +-----+---------------+---------+-----------+----------+--------------+ RIGHTCompressibilityPhasicitySpontaneityPropertiesThrombus Aging +-----+---------------+---------+-----------+----------+--------------+ CFV  Full           Yes      Yes                                 +-----+---------------+---------+-----------+----------+--------------+   +---------+---------------+---------+-----------+----------+-------------------+ LEFT     CompressibilityPhasicitySpontaneityPropertiesThrombus Aging      +---------+---------------+---------+-----------+----------+-------------------+ CFV      Full           Yes      Yes                                       +---------+---------------+---------+-----------+----------+-------------------+ SFJ      Full                                                             +---------+---------------+---------+-----------+----------+-------------------+ FV Prox  Full           Yes      Yes                                      +---------+---------------+---------+-----------+----------+-------------------+ FV Mid   Full           Yes      Yes                                      +---------+---------------+---------+-----------+----------+-------------------+ FV DistalFull           Yes      Yes                                      +---------+---------------+---------+-----------+----------+-------------------+ PFV      Full                                                             +---------+---------------+---------+-----------+----------+-------------------+ POP      Full           Yes      Yes                                      +---------+---------------+---------+-----------+----------+-------------------+ PTV  Full                                                             +---------+---------------+---------+-----------+----------+-------------------+ PERO     Full                                         Not well visualized +---------+---------------+---------+-----------+----------+-------------------+    Summary: RIGHT: - No evidence of common femoral vein obstruction.   LEFT: - There is no evidence of deep vein thrombosis in the lower extremity.  - No cystic structure found in the popliteal fossa.  *See table(s) above for measurements and observations. Electronically signed by Penne Colorado MD on 04/03/2024 at 12:12:03 PM.    Final    DG Knee Complete 4 Views Left Result Date: 04/03/2024 EXAM: 4 or more VIEW(S) XRAY OF THE LEFT KNEE 04/03/2024 11:00:00 AM COMPARISON: Left knee radiographs 03/09/2024. CLINICAL HISTORY: Patient presented after a fall on 08/14,  with complaints of left leg and lower back pain. The left leg is chronically swollen. FINDINGS: BONES AND JOINTS: No acute fracture. No focal osseous lesion. No joint dislocation. No significant joint effusion. No significant degenerative changes. Left knee total arthroplasty. SOFT TISSUES: The soft tissues are unremarkable. Atherosclerotic calcifications are present in the distal femoral artery, popliteal artery, and posterior tibial artery. IMPRESSION: 1. No acute fracture or dislocation. 2. Left knee total arthroplasty. 3. Atherosclerotic calcifications in the distal femoral artery, popliteal artery, and posterior tibial artery. Electronically signed by: Lonni Necessary MD 04/03/2024 11:17 AM EDT RP Workstation: [id number]   DG Ankle Complete Left Result Date: 04/03/2024 EXAM: 3 or more VIEW(S) XRAY OF THE LEFT ANKLE 04/03/2024 11:00:00 AM CLINICAL HISTORY: Patient fell on 8/14, tripping on a rug, and presently complains of left leg and lower back pain. The left leg is always swollen, per patient, as it is a chronic problem. COMPARISON: Left tibia and fibula 11/08/23. FINDINGS: BONES AND JOINTS: Left ankle arthrodesis again noted. Distal fibula was resected. Remote left fourth and fifth metatarsal fractures are present. No acute osseous abnormalities are present. SOFT TISSUES: The soft tissues are unremarkable. IMPRESSION: 1. No acute osseous abnormality. 2. Left ankle arthrodesis and remote left fourth and fifth metatarsal fractures. Electronically signed by: Lonni Necessary MD 04/03/2024 11:16 AM EDT RP Workstation: [id number]    Pertinent labs & imaging results that were available during my care of the patient were reviewed by me and considered in my medical decision making (see MDM for details).  Medications Ordered in ED Medications  oxyCODONE  (Oxy IR/ROXICODONE ) immediate release tablet 5 mg (5 mg Oral Given 04/03/24 1200)  HYDROcodone -acetaminophen  (NORCO/VICODIN) 5-325 MG per tablet 1  tablet (1 tablet Oral Given 04/03/24 1322)  Procedures Procedures  (including critical care time)  Medical Decision Making / ED Course    Medical Decision Making:    Luis Poole is a 83 y.o. male with past medical history as below, significant for PAD, CHF, foot drop left, cva who presents to the ED with complaint of left leg swelling.. The complaint involves an extensive differential diagnosis and also carries with it a high risk of complications and morbidity.  Serious etiology was considered. Ddx includes but is not limited to: Peripheral edema, chronic arthritis, fracture, VTE, thrombophlebitis, etc.  Complete initial physical exam performed, notably the patient was in no distress, sitting comfortably on bed.    Reviewed and confirmed nursing documentation for past medical history, family history, social history.  Vital signs reviewed.    Left leg pain Left leg swelling Frequent falls > - Patient with acute on chronic left leg pain and swelling, he had reassuring workup 3 days ago.  LE NVI -Will check x-ray, duplex, give analgesics - Imaging is stable, pain is improved  Patient has frequent falls, had a fall 2 days ago.  Acute on chronic pain to his left leg, acute on chronic swelling to his left leg.  Work appears reassuring, he is feeling better.  No significant back pain.  Moving his left leg at his typical level.  He does have left foot drop, unchanged.  LE NVI. Work appears reassuring, he is feeling better, recommend outpatient follow-up  Recheck and discharge patient reporting some low back pain after the fall.  Pain is improved with analgesics given here.  He is neurologically intact to the lower extremities, no evidence of cord compression, cauda equina or epidural abscess, no red flag symptoms or stable to his back pain.  He did have a recent  fall so we will get x-ray.  X-ray is stable.  His pain is not midline.  Continue with plan for discharge  Patient in no distress and overall condition is stable. Detailed discussions were had with the patient/guardian regarding current findings, and need for close f/u with PCP or on call doctor. The patient/guardian has been instructed to return immediately if the symptoms worsen in any way for re-evaluation. Patient/guardian verbalized understanding and is in agreement with current care plan. All questions answered prior to discharge.                    Additional history obtained: -Additional history obtained from na -External records from outside source obtained and reviewed including: Chart review including previous notes, labs, imaging, consultation notes including  Recent ER evaluation, recent imaging   Lab Tests: na  EKG   EKG Interpretation Date/Time:    Ventricular Rate:    PR Interval:    QRS Duration:    QT Interval:    QTC Calculation:   R Axis:      Text Interpretation:           Imaging Studies ordered: I ordered imaging studies including ankle/knee xr, duplex LLE I independently visualized the following imaging with scope of interpretation limited to determining acute life threatening conditions related to emergency care; findings noted above I agree with the radiologist interpretation If any imaging was obtained with contrast I closely monitored patient for any possible adverse reaction a/w contrast administration in the emergency department   Medicines ordered and prescription drug management: Meds ordered this encounter  Medications   oxyCODONE  (Oxy IR/ROXICODONE ) immediate release tablet 5 mg    Refill:  0  HYDROcodone -acetaminophen  (NORCO/VICODIN) 5-325 MG per tablet 1 tablet    Refill:  0   oxyCODONE -acetaminophen  (PERCOCET/ROXICET) 5-325 MG tablet    Sig: Take 1 tablet by mouth every 8 (eight) hours as needed for severe pain (pain  score 7-10).    Dispense:  5 tablet    Refill:  0    -I have reviewed the patients home medicines and have made adjustments as needed   Consultations Obtained: na   Cardiac Monitoring: Continuous pulse oximetry interpreted by myself, 99% on RA.    Social Determinants of Health:  Diagnosis or treatment significantly limited by social determinants of health: former smoker   Reevaluation: After the interventions noted above, I reevaluated the patient and found that they have improved  Co morbidities that complicate the patient evaluation  Past Medical History:  Diagnosis Date   Anemia, unspecified 06/08/2013   Cataract    CHF (congestive heart failure) (HCC)    Chronic kidney disease    Diabetes mellitus    Foot drop, left foot    AFO   Hypertension    PAD (peripheral artery disease) (HCC)    S/P lumbar fusion    Sequelae of cerebral infarction    Stroke [facility])       Dispostion: Disposition decision including need for hospitalization was considered, and patient discharged from emergency department.    Final Clinical Impression(s) / ED Diagnoses Final diagnoses:  Peripheral edema  Chronic pain of left lower extremity  Fall, initial encounter        Elnor Jayson LABOR, DO 04/03/24 1433

## 2024-04-03 NOTE — Discharge Instructions (Addendum)
 It was a pleasure caring for you today in the emergency department.  Please follow-up with your primary care doctor  Please return to the emergency department for any worsening or worrisome symptoms.

## 2024-04-03 NOTE — ED Triage Notes (Addendum)
 Pt here via [facility] from home for fall.  He fell on 8/14 (right after he was discharged from hospital).  States he tripped on a rug.  Presently c/o L leg pain.  Pt c/o lower back pain since the fall. L Leg is always swollen, per pt as it is a chronic problem.  Vs:  Bp 170/palp Spo2 91% ra Hr 56 Cbg 117

## 2024-04-11 ENCOUNTER — Other Ambulatory Visit: Payer: Self-pay

## 2024-04-11 ENCOUNTER — Emergency Department (HOSPITAL_COMMUNITY)

## 2024-04-11 ENCOUNTER — Emergency Department (HOSPITAL_COMMUNITY)
Admission: EM | Admit: 2024-04-11 | Discharge: 2024-04-12 | Disposition: A | Discharge status: Home / Self Care | Attending: Emergency Medicine | Admitting: Emergency Medicine

## 2024-04-11 ENCOUNTER — Encounter (HOSPITAL_COMMUNITY): Payer: Self-pay

## 2024-04-11 DIAGNOSIS — W050XXA Fall from non-moving wheelchair, initial encounter: Secondary | ICD-10-CM | POA: Diagnosis not present

## 2024-04-11 DIAGNOSIS — Z7982 Long term (current) use of aspirin: Secondary | ICD-10-CM | POA: Diagnosis not present

## 2024-04-11 DIAGNOSIS — N189 Chronic kidney disease, unspecified: Secondary | ICD-10-CM | POA: Insufficient documentation

## 2024-04-11 DIAGNOSIS — R2242 Localized swelling, mass and lump, left lower limb: Secondary | ICD-10-CM | POA: Diagnosis present

## 2024-04-11 DIAGNOSIS — M5416 Radiculopathy, lumbar region: Secondary | ICD-10-CM

## 2024-04-11 DIAGNOSIS — E1122 Type 2 diabetes mellitus with diabetic chronic kidney disease: Secondary | ICD-10-CM | POA: Diagnosis not present

## 2024-04-11 DIAGNOSIS — S50312A Abrasion of left elbow, initial encounter: Secondary | ICD-10-CM | POA: Insufficient documentation

## 2024-04-11 DIAGNOSIS — I509 Heart failure, unspecified: Secondary | ICD-10-CM | POA: Diagnosis not present

## 2024-04-11 DIAGNOSIS — G8929 Other chronic pain: Secondary | ICD-10-CM | POA: Insufficient documentation

## 2024-04-11 DIAGNOSIS — M545 Low back pain, unspecified: Secondary | ICD-10-CM | POA: Insufficient documentation

## 2024-04-11 DIAGNOSIS — T148XXA Other injury of unspecified body region, initial encounter: Secondary | ICD-10-CM

## 2024-04-11 DIAGNOSIS — S80212A Abrasion, left knee, initial encounter: Secondary | ICD-10-CM | POA: Insufficient documentation

## 2024-04-11 DIAGNOSIS — I2510 Atherosclerotic heart disease of native coronary artery without angina pectoris: Secondary | ICD-10-CM | POA: Diagnosis not present

## 2024-04-11 DIAGNOSIS — W19XXXA Unspecified fall, initial encounter: Secondary | ICD-10-CM

## 2024-04-11 MED ORDER — OXYCODONE HCL 5 MG PO TABS: 5.0000 mg | ORAL_TABLET | Freq: Four times a day (QID) | ORAL | 0 refills | Status: DC | PRN

## 2024-04-11 MED ORDER — MORPHINE SULFATE (PF) 4 MG/ML IV SOLN
4.0000 mg | Freq: Once | INTRAVENOUS | Status: AC
  Administered 2024-04-11: 4 mg via INTRAMUSCULAR
  Filled 2024-04-11: qty 1

## 2024-04-11 NOTE — ED Notes (Signed)
 Pt in imaging at this time.

## 2024-04-11 NOTE — ED Triage Notes (Addendum)
 Pt arrives EMS from home with reports of fall from wheelchair. Pt reports he fell out of chair and hit head on trash can outside. Pt not on blood thinners. Pt reports worsening chronic back and hip pain.

## 2024-04-11 NOTE — ED Provider Notes (Signed)
 [facility] EMERGENCY DEPARTMENT AT [facility] Provider Note   CSN: [id number] Arrival date & time: 04/11/24  1946    Patient presents with: Luis Poole is a 83 y.o. male with known chronic pain, prior CVA, diabetes, heart failure, CKD, CAD here for evaluation of fall.  He was in his wheelchair earlier today when he was pushing a trash can out and his wheelchair hit a rock and the wheelchair fell on the side.  He states he was strapped into the wheelchair with his gait belt and he slid down and hit his left knee, left hip left elbow.  He states his tetanus is up-to-date.  He states he has chronic pain in general.  He has been taking Percocets today without relief.  No new numbness or weakness.  He states he primarily uses a wheelchair however does stand and take a few steps.  He wears a chronic brace to his left lower extremity and has chronic left lower extremity edema.  He states he was seen recently had ultrasound which did not show DVT.  He thinks he may have hit the left side of his head.  He states he is otherwise well prior to this.  Tetanus up-to-date   HPI     Prior to Admission medications   Medication Sig Start Date End Date Taking? Authorizing Provider  oxyCODONE  (ROXICODONE ) 5 MG immediate release tablet Take 1 tablet (5 mg total) by mouth every 6 (six) hours as needed for severe pain (pain score 7-10). 04/11/24  Yes Erskin Zinda A, PA-C  allopurinol  (ZYLOPRIM ) 100 MG tablet Take 100 mg by mouth daily.    [provider]  amLODipine  (NORVASC ) 10 MG tablet Take 0.5 tablets (5 mg total) by mouth daily. 08/12/23   Danford, Lonni SHAUNNA, MD  aspirin  EC 81 MG tablet Take 81 mg by mouth daily.    [provider]  atorvastatin  (LIPITOR) 20 MG tablet Take 1 tablet (20 mg total) by mouth daily. Patient taking differently: Take 20 mg by mouth daily as needed (High Cholesterol). 10/09/17   Jillian Buttery, MD  colchicine  0.6 MG tablet Take 1 tablet  (0.6 mg total) daily by mouth. Patient taking differently: Take 0.6 mg by mouth daily as needed (gout pain). 06/29/17   Dolan Mateo Larger, MD  cyclobenzaprine  (FLEXERIL ) 5 MG tablet Take 1 tablet (5 mg total) by mouth 2 (two) times daily as needed for muscle spasms. 03/21/24   Curatolo, Adam, DO  ferrous sulfate  325 (65 FE) MG tablet Take 325 mg by mouth daily with breakfast.    [provider]  lidocaine  (LIDODERM ) 5 % Place 1 patch onto the skin daily. Remove & Discard patch within 12 hours or as directed by MD 03/31/24   Barrett, Jamie N, PA-C  meclizine  (ANTIVERT ) 12.5 MG tablet Take 1 tablet (12.5 mg total) by mouth 3 (three) times daily as needed for dizziness. 08/02/23   Ula Prentice SAUNDERS, MD  methylPREDNISolone  (MEDROL  DOSEPAK) 4 MG TBPK tablet Follow package insert 03/21/24   Curatolo, Adam, DO  oxyCODONE -acetaminophen  (PERCOCET/ROXICET) 5-325 MG tablet Take 1 tablet by mouth every 8 (eight) hours as needed for severe pain (pain score 7-10). 04/03/24   Elnor Savant A, DO  polyethylene glycol (MIRALAX  / GLYCOLAX ) 17 g packet Take 17 g by mouth daily as needed for mild constipation. 11/29/22   Rizwan, Saima, MD  valsartan  (DIOVAN ) 160 MG tablet Take 1 tablet (160 mg total) by mouth daily. 04/10/23  Lucien Orren SAILOR, PA-C    Allergies: Ibuprofen , Lisinopril, Pregabalin , and Gabapentin     Review of Systems  Constitutional: Negative.   HENT: Negative.    Respiratory: Negative.    Cardiovascular: Negative.   Genitourinary: Negative.   Musculoskeletal:        Left elbow, left hip, left knee  Skin:  Positive for wound.  Neurological: Negative.   All other systems reviewed and are negative.   Updated Vital Signs BP (!) 144/119   Pulse 67   Temp 98.2 F (36.8 C)   Resp 13   SpO2 100%   Physical Exam Vitals and nursing note reviewed.  Constitutional:      General: He is not in acute distress.    Appearance: He is well-developed. He is not ill-appearing, toxic-appearing or  diaphoretic.  HENT:     Head: Atraumatic. No raccoon eyes, Battle's sign, abrasion, contusion, masses, right periorbital erythema, left periorbital erythema or laceration.     Jaw: There is normal jaw occlusion.     Comments: No raccoon eyes, Battle sign, hematoma, lacerations or abrasions Eyes:     Pupils: Pupils are equal, round, and reactive to light.  Neck:      Comments: Tenderness left cervical paraspinal region, full range of motion without difficulty Cardiovascular:     Rate and Rhythm: Normal rate and regular rhythm.     Pulses: Normal pulses.          Radial pulses are 2+ on the right side and 2+ on the left side.       Posterior tibial pulses are 2+ on the right side and 2+ on the left side.     Heart sounds: Normal heart sounds.  Pulmonary:     Effort: Pulmonary effort is normal. No respiratory distress.     Breath sounds: Normal breath sounds and air entry.     Comments: Clear bilaterally, speaks in full sentences without difficulty Chest:     Comments: Nontender chest wall, no crepitus or step-off Abdominal:     General: Bowel sounds are normal. There is no distension.     Palpations: Abdomen is soft.     Tenderness: There is no abdominal tenderness. There is no guarding or rebound.     Comments: Soft, nontender, no obvious traumatic injuries  Musculoskeletal:        General: Tenderness and signs of injury present.     Cervical back: Normal range of motion and neck supple.     Comments: Mild tenderness left elbow, full range of motion bilateral upper extremities without difficulty.  Full range of motion to right lower extremity.  Decreased range of motion with flexion extension left leg however patient states this is chronic.  He has some diffuse tenderness to anterior left patella with overlying 2 cm skin tear.  No lacerations to suture.  Tenderness left lumbar paraspinal region.  Skin:    General: Skin is warm and dry.     Capillary Refill: Capillary refill takes less  than 2 seconds.     Comments: Abrasion left elbow, left knee, no lacerations to suture  Neurological:     General: No focal deficit present.     Mental Status: He is alert and oriented to person, place, and time.     Cranial Nerves: Cranial nerves 2-12 are intact.     Sensory: Sensation is intact.     Motor: No tremor.     Gait: Gait abnormal.     (all labs ordered are  listed, but only abnormal results are displayed) Labs Reviewed - No data to display  EKG: EKG Interpretation Date/Time:  Sunday April 11 2024 19:54:21 EDT Ventricular Rate:  64 PR Interval:  205 QRS Duration:  151 QT Interval:  423 QTC Calculation: 437 R Axis:   59  Text Interpretation: Sinus arrhythmia Right bundle branch block when compared top rior, similar appearance No STEMI Confirmed by Ginger Barefoot ([id number]) on 04/11/2024 7:56:18 PM  Radiology: CT Head Wo Contrast Result Date: 04/11/2024 CLINICAL DATA:  Clemens out of chair and hit head, with head and neck trauma and back pain. EXAM: CT HEAD WITHOUT CONTRAST CT CERVICAL SPINE WITHOUT CONTRAST CT LUMBAR SPINE WITHOUT CONTRAST TECHNIQUE: Multidetector CT imaging of the head, cervical and lumbar spine was performed following the standard protocol without intravenous contrast. Multiplanar CT image reconstructions were also generated. RADIATION DOSE REDUCTION: This exam was performed according to the departmental dose-optimization program which includes automated exposure control, adjustment of the mA and/or kV according to patient size and/or use of iterative reconstruction technique. COMPARISON:  CT scan head and cervical spine both 03/09/2024, CT lumbar spine 03/31/2024. FINDINGS: CT HEAD FINDINGS Brain: A chronic parasagittal right frontoparietal ACA territory infarct is again noted with gliosis extending down to the ventricle. Background mild global atrophy, mild small-vessel disease and atrophic ventriculomegaly. No midline shift is seen. Basal cisterns are clear. No  cortical based acute infarct, hemorrhage or mass effect identified. Vascular: The carotid siphons are heavily calcified. No hyperdense central vessel is seen. Skull: There is slight swelling in the left forehead scalp without space-occupying hematoma. Chronic left-sided nasal bone fracture deformity and no evidence of depressed skull fractures. Sinuses/Orbits: Chronic depressed fractures of the medial wall of both orbits are again shown. No acute orbital abnormality is seen.  Old left lens replacement. There is partial opacification again in the left ethmoid air cells. Other sinuses are clear. There is chronic left mastoid effusion and partial opacification left middle ear cavity. The right mastoids and middle ear are clear. Other: Left-sided deviation and spurring nasal septum. CT CERVICAL SPINE FINDINGS Alignment: Chronic straightened slightly reversed cervical lordosis, with minimal chronic grade 1 C4-5 anterolisthesis likely discogenic. No traumatic or further listhesis is seen. There is bone-on-bone anterior atlantodental joint space loss with osteophytes. Skull base and vertebrae: No acute fracture. No primary bone lesion or focal pathologic process. There is old C5-7 ACDF plating with solid interbody fusions, with again noted cerclage wiring over the spinous processes. There is no evidence of hardware failure or loosening. Facet joints are ankylosed on right at C5-6. Soft tissues and spinal canal: No prevertebral fluid or swelling. No visible canal hematoma. There is moderate calcific plaque of the proximal cervical ICAs. There is a 1.6 cm heterogeneous nodule in the inferior pole of the left lobe of the thyroid  gland. Nonemergent ultrasound follow-up is recommended. No laryngeal mass. Disc levels: There is adjacent-segment partial disc space loss and small marginal osteophytes at C4-5 and C7-T1. At C4-5 there is a mild nonstenosing posterior disc bulge without herniation. The C2-3 and C3-4 discs are normal  in height. No significant soft tissue or bony encroachment on the thecal sac is seen. There are facet spurs at most levels, but no levels with significant foraminal narrowing. Upper chest: Negative. Other: None. CT LUMBAR SPINE FINDINGS Segmentation: Standard. Alignment: Mild levoscoliosis apex L2-3. Grade 1 chronic fused L5-S1 anterolisthesis. Trace grade 1 L2-3 discogenic retrolisthesis. No new alignment abnormality or traumatic malalignment. Vertebrae: Osteopenia. The vertebra are normal in  heights. There is no evidence of fractures or focal pathologic or destructive lesion. There is old L5-S1 fusion hardware with posterior rods and pedicle screws, decompression laminectomies and solid interbody fusion with interbody punctate metallic densities. Paraspinal and other soft tissues: Aortic atherosclerosis. No acute abnormality. Moderate-sized hiatal hernia. There is an implanted left flank neurostimulator battery. Disc levels: Moderate lumbar spondylosis. This is greatest at L2-3. There is partial disc space loss at L2-3 and L3-4 with vacuum phenomenon, normal disc heights of L1-2 and L4-5 with vacuum phenomenon at L1-2. No significant findings at T10-11, T11-12 or T12-L1. At L1-2, there are trace facet spurs without significant disc bulge or herniation. The foramina are clear. At L2-3, there is a circumferential disc osteophyte complex mildly encroaching on the subarticular zones without spinal canal stenosis or herniation. There are mild facet spurs and moderate foraminal stenosis. At L3-4, there is mild disc bulging, ligamentous and facet hypertrophy encroaching on the subarticular zones, without herniation or canal zone stenosis. Mild foraminal stenosis. At L4-5, spinal canal is widely patent. Laminectomy defect. No significant disc bulge, no herniation. Moderate facet spurring noted and mild foraminal stenosis. At L5-S1, mature fusion.  Foramina and spinal canal widely patent. There is asymmetric sclerosis at  right SI joint, spurring and vacuum phenomenon both SI joints without ankylosis. Other: None. IMPRESSION: 1. No acute intracranial CT findings or depressed skull fractures. 2. Chronic right ACA territory infarct, atrophy and small-vessel disease. 3. Chronic left mastoid effusion and partial opacification of the left middle ear cavity. 4. Chronic depressed fractures of the medial wall of both orbits and left nasal bone. 5. Osteopenia and degenerative change without evidence of cervical or lumbar fractures. 6. Old C5-7 ACDF plating with solid interbody fusions and no evidence of hardware failure or loosening. 7. 1.6 cm heterogeneous nodule in the inferior pole of the left lobe of the thyroid  gland. Nonemergent ultrasound follow-up is recommended. 8. Chronic L5-S1 fusion with decompression laminectomies and solid interbody fusion. 9. Scoliosis and degenerative changes lumbar spine.  Details above. 10. Aortic atherosclerosis. 11. Moderate-sized hiatal hernia. Aortic Atherosclerosis (ICD10-I70.0). Electronically Signed   By: Francis Quam M.D.   On: 04/11/2024 21:48   CT Cervical Spine Wo Contrast Result Date: 04/11/2024 CLINICAL DATA:  Clemens out of chair and hit head, with head and neck trauma and back pain. EXAM: CT HEAD WITHOUT CONTRAST CT CERVICAL SPINE WITHOUT CONTRAST CT LUMBAR SPINE WITHOUT CONTRAST TECHNIQUE: Multidetector CT imaging of the head, cervical and lumbar spine was performed following the standard protocol without intravenous contrast. Multiplanar CT image reconstructions were also generated. RADIATION DOSE REDUCTION: This exam was performed according to the departmental dose-optimization program which includes automated exposure control, adjustment of the mA and/or kV according to patient size and/or use of iterative reconstruction technique. COMPARISON:  CT scan head and cervical spine both 03/09/2024, CT lumbar spine 03/31/2024. FINDINGS: CT HEAD FINDINGS Brain: A chronic parasagittal right  frontoparietal ACA territory infarct is again noted with gliosis extending down to the ventricle. Background mild global atrophy, mild small-vessel disease and atrophic ventriculomegaly. No midline shift is seen. Basal cisterns are clear. No cortical based acute infarct, hemorrhage or mass effect identified. Vascular: The carotid siphons are heavily calcified. No hyperdense central vessel is seen. Skull: There is slight swelling in the left forehead scalp without space-occupying hematoma. Chronic left-sided nasal bone fracture deformity and no evidence of depressed skull fractures. Sinuses/Orbits: Chronic depressed fractures of the medial wall of both orbits are again shown. No acute orbital abnormality  is seen.  Old left lens replacement. There is partial opacification again in the left ethmoid air cells. Other sinuses are clear. There is chronic left mastoid effusion and partial opacification left middle ear cavity. The right mastoids and middle ear are clear. Other: Left-sided deviation and spurring nasal septum. CT CERVICAL SPINE FINDINGS Alignment: Chronic straightened slightly reversed cervical lordosis, with minimal chronic grade 1 C4-5 anterolisthesis likely discogenic. No traumatic or further listhesis is seen. There is bone-on-bone anterior atlantodental joint space loss with osteophytes. Skull base and vertebrae: No acute fracture. No primary bone lesion or focal pathologic process. There is old C5-7 ACDF plating with solid interbody fusions, with again noted cerclage wiring over the spinous processes. There is no evidence of hardware failure or loosening. Facet joints are ankylosed on right at C5-6. Soft tissues and spinal canal: No prevertebral fluid or swelling. No visible canal hematoma. There is moderate calcific plaque of the proximal cervical ICAs. There is a 1.6 cm heterogeneous nodule in the inferior pole of the left lobe of the thyroid  gland. Nonemergent ultrasound follow-up is recommended. No  laryngeal mass. Disc levels: There is adjacent-segment partial disc space loss and small marginal osteophytes at C4-5 and C7-T1. At C4-5 there is a mild nonstenosing posterior disc bulge without herniation. The C2-3 and C3-4 discs are normal in height. No significant soft tissue or bony encroachment on the thecal sac is seen. There are facet spurs at most levels, but no levels with significant foraminal narrowing. Upper chest: Negative. Other: None. CT LUMBAR SPINE FINDINGS Segmentation: Standard. Alignment: Mild levoscoliosis apex L2-3. Grade 1 chronic fused L5-S1 anterolisthesis. Trace grade 1 L2-3 discogenic retrolisthesis. No new alignment abnormality or traumatic malalignment. Vertebrae: Osteopenia. The vertebra are normal in heights. There is no evidence of fractures or focal pathologic or destructive lesion. There is old L5-S1 fusion hardware with posterior rods and pedicle screws, decompression laminectomies and solid interbody fusion with interbody punctate metallic densities. Paraspinal and other soft tissues: Aortic atherosclerosis. No acute abnormality. Moderate-sized hiatal hernia. There is an implanted left flank neurostimulator battery. Disc levels: Moderate lumbar spondylosis. This is greatest at L2-3. There is partial disc space loss at L2-3 and L3-4 with vacuum phenomenon, normal disc heights of L1-2 and L4-5 with vacuum phenomenon at L1-2. No significant findings at T10-11, T11-12 or T12-L1. At L1-2, there are trace facet spurs without significant disc bulge or herniation. The foramina are clear. At L2-3, there is a circumferential disc osteophyte complex mildly encroaching on the subarticular zones without spinal canal stenosis or herniation. There are mild facet spurs and moderate foraminal stenosis. At L3-4, there is mild disc bulging, ligamentous and facet hypertrophy encroaching on the subarticular zones, without herniation or canal zone stenosis. Mild foraminal stenosis. At L4-5, spinal  canal is widely patent. Laminectomy defect. No significant disc bulge, no herniation. Moderate facet spurring noted and mild foraminal stenosis. At L5-S1, mature fusion.  Foramina and spinal canal widely patent. There is asymmetric sclerosis at right SI joint, spurring and vacuum phenomenon both SI joints without ankylosis. Other: None. IMPRESSION: 1. No acute intracranial CT findings or depressed skull fractures. 2. Chronic right ACA territory infarct, atrophy and small-vessel disease. 3. Chronic left mastoid effusion and partial opacification of the left middle ear cavity. 4. Chronic depressed fractures of the medial wall of both orbits and left nasal bone. 5. Osteopenia and degenerative change without evidence of cervical or lumbar fractures. 6. Old C5-7 ACDF plating with solid interbody fusions and no evidence of hardware failure  or loosening. 7. 1.6 cm heterogeneous nodule in the inferior pole of the left lobe of the thyroid  gland. Nonemergent ultrasound follow-up is recommended. 8. Chronic L5-S1 fusion with decompression laminectomies and solid interbody fusion. 9. Scoliosis and degenerative changes lumbar spine.  Details above. 10. Aortic atherosclerosis. 11. Moderate-sized hiatal hernia. Aortic Atherosclerosis (ICD10-I70.0). Electronically Signed   By: Francis Quam M.D.   On: 04/11/2024 21:48   CT LUMBAR SPINE WO CONTRAST Result Date: 04/11/2024 CLINICAL DATA:  Clemens out of chair and hit head, with head and neck trauma and back pain. EXAM: CT HEAD WITHOUT CONTRAST CT CERVICAL SPINE WITHOUT CONTRAST CT LUMBAR SPINE WITHOUT CONTRAST TECHNIQUE: Multidetector CT imaging of the head, cervical and lumbar spine was performed following the standard protocol without intravenous contrast. Multiplanar CT image reconstructions were also generated. RADIATION DOSE REDUCTION: This exam was performed according to the departmental dose-optimization program which includes automated exposure control, adjustment of the mA  and/or kV according to patient size and/or use of iterative reconstruction technique. COMPARISON:  CT scan head and cervical spine both 03/09/2024, CT lumbar spine 03/31/2024. FINDINGS: CT HEAD FINDINGS Brain: A chronic parasagittal right frontoparietal ACA territory infarct is again noted with gliosis extending down to the ventricle. Background mild global atrophy, mild small-vessel disease and atrophic ventriculomegaly. No midline shift is seen. Basal cisterns are clear. No cortical based acute infarct, hemorrhage or mass effect identified. Vascular: The carotid siphons are heavily calcified. No hyperdense central vessel is seen. Skull: There is slight swelling in the left forehead scalp without space-occupying hematoma. Chronic left-sided nasal bone fracture deformity and no evidence of depressed skull fractures. Sinuses/Orbits: Chronic depressed fractures of the medial wall of both orbits are again shown. No acute orbital abnormality is seen.  Old left lens replacement. There is partial opacification again in the left ethmoid air cells. Other sinuses are clear. There is chronic left mastoid effusion and partial opacification left middle ear cavity. The right mastoids and middle ear are clear. Other: Left-sided deviation and spurring nasal septum. CT CERVICAL SPINE FINDINGS Alignment: Chronic straightened slightly reversed cervical lordosis, with minimal chronic grade 1 C4-5 anterolisthesis likely discogenic. No traumatic or further listhesis is seen. There is bone-on-bone anterior atlantodental joint space loss with osteophytes. Skull base and vertebrae: No acute fracture. No primary bone lesion or focal pathologic process. There is old C5-7 ACDF plating with solid interbody fusions, with again noted cerclage wiring over the spinous processes. There is no evidence of hardware failure or loosening. Facet joints are ankylosed on right at C5-6. Soft tissues and spinal canal: No prevertebral fluid or swelling. No  visible canal hematoma. There is moderate calcific plaque of the proximal cervical ICAs. There is a 1.6 cm heterogeneous nodule in the inferior pole of the left lobe of the thyroid  gland. Nonemergent ultrasound follow-up is recommended. No laryngeal mass. Disc levels: There is adjacent-segment partial disc space loss and small marginal osteophytes at C4-5 and C7-T1. At C4-5 there is a mild nonstenosing posterior disc bulge without herniation. The C2-3 and C3-4 discs are normal in height. No significant soft tissue or bony encroachment on the thecal sac is seen. There are facet spurs at most levels, but no levels with significant foraminal narrowing. Upper chest: Negative. Other: None. CT LUMBAR SPINE FINDINGS Segmentation: Standard. Alignment: Mild levoscoliosis apex L2-3. Grade 1 chronic fused L5-S1 anterolisthesis. Trace grade 1 L2-3 discogenic retrolisthesis. No new alignment abnormality or traumatic malalignment. Vertebrae: Osteopenia. The vertebra are normal in heights. There is no evidence of  fractures or focal pathologic or destructive lesion. There is old L5-S1 fusion hardware with posterior rods and pedicle screws, decompression laminectomies and solid interbody fusion with interbody punctate metallic densities. Paraspinal and other soft tissues: Aortic atherosclerosis. No acute abnormality. Moderate-sized hiatal hernia. There is an implanted left flank neurostimulator battery. Disc levels: Moderate lumbar spondylosis. This is greatest at L2-3. There is partial disc space loss at L2-3 and L3-4 with vacuum phenomenon, normal disc heights of L1-2 and L4-5 with vacuum phenomenon at L1-2. No significant findings at T10-11, T11-12 or T12-L1. At L1-2, there are trace facet spurs without significant disc bulge or herniation. The foramina are clear. At L2-3, there is a circumferential disc osteophyte complex mildly encroaching on the subarticular zones without spinal canal stenosis or herniation. There are mild  facet spurs and moderate foraminal stenosis. At L3-4, there is mild disc bulging, ligamentous and facet hypertrophy encroaching on the subarticular zones, without herniation or canal zone stenosis. Mild foraminal stenosis. At L4-5, spinal canal is widely patent. Laminectomy defect. No significant disc bulge, no herniation. Moderate facet spurring noted and mild foraminal stenosis. At L5-S1, mature fusion.  Foramina and spinal canal widely patent. There is asymmetric sclerosis at right SI joint, spurring and vacuum phenomenon both SI joints without ankylosis. Other: None. IMPRESSION: 1. No acute intracranial CT findings or depressed skull fractures. 2. Chronic right ACA territory infarct, atrophy and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

## 2024-04-11 NOTE — Discharge Instructions (Signed)
 It was a pleasure taking care of you here today your imaging did not show any evidence of broken bones.  Make sure to follow-up outpatient for your chronic pain.  I have filled a short course of additional pain medicine.  Return for any worsening symptoms

## 2024-04-12 NOTE — ED Notes (Signed)
 Patient was given graham crackers, peanut butter and cup of ice and a lemon lime soda.

## 2024-04-13 ENCOUNTER — Other Ambulatory Visit: Payer: Self-pay

## 2024-04-13 ENCOUNTER — Encounter (HOSPITAL_COMMUNITY): Payer: Self-pay

## 2024-04-13 ENCOUNTER — Emergency Department (HOSPITAL_COMMUNITY)

## 2024-04-13 ENCOUNTER — Emergency Department (HOSPITAL_COMMUNITY)
Admission: EM | Admit: 2024-04-13 | Discharge: 2024-04-13 | Disposition: A | Discharge status: Home / Self Care | Attending: Emergency Medicine | Admitting: Emergency Medicine

## 2024-04-13 DIAGNOSIS — R001 Bradycardia, unspecified: Secondary | ICD-10-CM | POA: Insufficient documentation

## 2024-04-13 DIAGNOSIS — M25552 Pain in left hip: Secondary | ICD-10-CM | POA: Diagnosis not present

## 2024-04-13 DIAGNOSIS — E119 Type 2 diabetes mellitus without complications: Secondary | ICD-10-CM | POA: Diagnosis not present

## 2024-04-13 DIAGNOSIS — Z87891 Personal history of nicotine dependence: Secondary | ICD-10-CM | POA: Diagnosis not present

## 2024-04-13 DIAGNOSIS — M542 Cervicalgia: Secondary | ICD-10-CM | POA: Insufficient documentation

## 2024-04-13 DIAGNOSIS — I10 Essential (primary) hypertension: Secondary | ICD-10-CM | POA: Insufficient documentation

## 2024-04-13 DIAGNOSIS — M25512 Pain in left shoulder: Secondary | ICD-10-CM | POA: Diagnosis not present

## 2024-04-13 DIAGNOSIS — M25562 Pain in left knee: Secondary | ICD-10-CM | POA: Insufficient documentation

## 2024-04-13 DIAGNOSIS — R600 Localized edema: Secondary | ICD-10-CM | POA: Insufficient documentation

## 2024-04-13 DIAGNOSIS — Z79899 Other long term (current) drug therapy: Secondary | ICD-10-CM | POA: Insufficient documentation

## 2024-04-13 DIAGNOSIS — Z7982 Long term (current) use of aspirin: Secondary | ICD-10-CM | POA: Diagnosis not present

## 2024-04-13 DIAGNOSIS — W050XXA Fall from non-moving wheelchair, initial encounter: Secondary | ICD-10-CM | POA: Diagnosis not present

## 2024-04-13 DIAGNOSIS — W19XXXA Unspecified fall, initial encounter: Secondary | ICD-10-CM

## 2024-04-13 NOTE — ED Triage Notes (Signed)
 BIB EMS from home, pt son calls EMS almost everyday for pt to be taken to hospital. Pt has no complaints when EMS arrives, but then tells EMS his entire body hurts. Pt seen at [facility] ED 2 days ago.
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
## 2024-04-13 NOTE — ED Triage Notes (Signed)
 Pt now states that he fell last night and hurt his sacrum, denies head injury, head hit his mattress on top of the bed.

## 2024-04-13 NOTE — Discharge Instructions (Signed)
 Return to the emergency department if your symptoms worsen.  Follow-up with your primary care provider and your pain management specialist in regard to your chronic pain.

## 2024-04-13 NOTE — ED Notes (Signed)
Called [facility] to transport patient home.  

## 2024-04-13 NOTE — ED Notes (Signed)
 Dr Garrick made aware of rectal temp of 96.0 F.

## 2024-04-24 ENCOUNTER — Other Ambulatory Visit: Payer: Self-pay

## 2024-04-24 ENCOUNTER — Emergency Department (HOSPITAL_BASED_OUTPATIENT_CLINIC_OR_DEPARTMENT_OTHER)

## 2024-04-24 ENCOUNTER — Emergency Department (HOSPITAL_BASED_OUTPATIENT_CLINIC_OR_DEPARTMENT_OTHER)
Admission: EM | Admit: 2024-04-24 | Discharge: 2024-04-24 | Disposition: A | Discharge status: Home / Self Care | Attending: Emergency Medicine | Admitting: Emergency Medicine

## 2024-04-24 ENCOUNTER — Emergency Department (HOSPITAL_BASED_OUTPATIENT_CLINIC_OR_DEPARTMENT_OTHER): Admitting: Radiology

## 2024-04-24 ENCOUNTER — Encounter (HOSPITAL_BASED_OUTPATIENT_CLINIC_OR_DEPARTMENT_OTHER): Payer: Self-pay

## 2024-04-24 DIAGNOSIS — I70202 Unspecified atherosclerosis of native arteries of extremities, left leg: Secondary | ICD-10-CM | POA: Insufficient documentation

## 2024-04-24 DIAGNOSIS — M545 Low back pain, unspecified: Secondary | ICD-10-CM | POA: Insufficient documentation

## 2024-04-24 DIAGNOSIS — Z981 Arthrodesis status: Secondary | ICD-10-CM | POA: Diagnosis not present

## 2024-04-24 DIAGNOSIS — K4090 Unilateral inguinal hernia, without obstruction or gangrene, not specified as recurrent: Secondary | ICD-10-CM | POA: Diagnosis not present

## 2024-04-24 DIAGNOSIS — E041 Nontoxic single thyroid nodule: Secondary | ICD-10-CM | POA: Diagnosis not present

## 2024-04-24 DIAGNOSIS — W06XXXA Fall from bed, initial encounter: Secondary | ICD-10-CM | POA: Insufficient documentation

## 2024-04-24 DIAGNOSIS — Z96652 Presence of left artificial knee joint: Secondary | ICD-10-CM | POA: Insufficient documentation

## 2024-04-24 DIAGNOSIS — M25562 Pain in left knee: Secondary | ICD-10-CM | POA: Insufficient documentation

## 2024-04-24 DIAGNOSIS — W19XXXA Unspecified fall, initial encounter: Secondary | ICD-10-CM

## 2024-04-24 DIAGNOSIS — S0990XA Unspecified injury of head, initial encounter: Secondary | ICD-10-CM | POA: Diagnosis present

## 2024-04-24 DIAGNOSIS — M1612 Unilateral primary osteoarthritis, left hip: Secondary | ICD-10-CM | POA: Diagnosis not present

## 2024-04-24 DIAGNOSIS — Z8673 Personal history of transient ischemic attack (TIA), and cerebral infarction without residual deficits: Secondary | ICD-10-CM | POA: Diagnosis not present

## 2024-04-24 DIAGNOSIS — S7002XA Contusion of left hip, initial encounter: Secondary | ICD-10-CM | POA: Diagnosis not present

## 2024-04-24 DIAGNOSIS — Z9682 Presence of neurostimulator: Secondary | ICD-10-CM | POA: Diagnosis not present

## 2024-04-24 DIAGNOSIS — S79912A Unspecified injury of left hip, initial encounter: Secondary | ICD-10-CM | POA: Diagnosis present

## 2024-04-24 NOTE — Discharge Instructions (Addendum)
 X-rays negative for fracture.  Does show chronic changes involving your lumbar spine.  You have a thyroid  nodule which needs ultrasound follow-up by your primary doctor Return to the ED with new or worsening symptoms

## 2024-04-24 NOTE — ED Triage Notes (Signed)
 Arrives via EMS. States he slid out of bed onto his bottom. Denies hitting his head, LOC or any other injuries. No thinners. States he has lower back pain but states that was present before the fall.

## 2024-04-24 NOTE — ED Notes (Signed)
 [facility] called for transport home.

## 2024-04-24 NOTE — ED Notes (Signed)
[facility] arrived to transport patient home 
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
## 2024-05-07 ENCOUNTER — Other Ambulatory Visit: Payer: Self-pay

## 2024-05-07 ENCOUNTER — Emergency Department (HOSPITAL_COMMUNITY)

## 2024-05-07 ENCOUNTER — Encounter (HOSPITAL_COMMUNITY): Payer: Self-pay

## 2024-05-07 ENCOUNTER — Emergency Department (HOSPITAL_COMMUNITY)
Admission: EM | Admit: 2024-05-07 | Discharge: 2024-05-08 | Disposition: A | Discharge status: Home / Self Care | Attending: Emergency Medicine | Admitting: Emergency Medicine

## 2024-05-07 DIAGNOSIS — I130 Hypertensive heart and chronic kidney disease with heart failure and stage 1 through stage 4 chronic kidney disease, or unspecified chronic kidney disease: Secondary | ICD-10-CM | POA: Insufficient documentation

## 2024-05-07 DIAGNOSIS — M5442 Lumbago with sciatica, left side: Secondary | ICD-10-CM | POA: Diagnosis present

## 2024-05-07 DIAGNOSIS — N189 Chronic kidney disease, unspecified: Secondary | ICD-10-CM | POA: Insufficient documentation

## 2024-05-07 DIAGNOSIS — Z79899 Other long term (current) drug therapy: Secondary | ICD-10-CM | POA: Insufficient documentation

## 2024-05-07 DIAGNOSIS — Z7982 Long term (current) use of aspirin: Secondary | ICD-10-CM | POA: Diagnosis not present

## 2024-05-07 DIAGNOSIS — E876 Hypokalemia: Secondary | ICD-10-CM | POA: Diagnosis not present

## 2024-05-07 DIAGNOSIS — I509 Heart failure, unspecified: Secondary | ICD-10-CM | POA: Insufficient documentation

## 2024-05-07 DIAGNOSIS — R2242 Localized swelling, mass and lump, left lower limb: Secondary | ICD-10-CM | POA: Diagnosis not present

## 2024-05-07 DIAGNOSIS — G8929 Other chronic pain: Secondary | ICD-10-CM | POA: Insufficient documentation

## 2024-05-07 DIAGNOSIS — E1122 Type 2 diabetes mellitus with diabetic chronic kidney disease: Secondary | ICD-10-CM | POA: Diagnosis not present

## 2024-05-07 LAB — CBC
HCT: 36.1 % — ABNORMAL LOW (ref 39.0–52.0)
Hemoglobin: 11.5 g/dL — ABNORMAL LOW (ref 13.0–17.0)
MCH: 28 pg (ref 26.0–34.0)
MCHC: 31.9 g/dL (ref 30.0–36.0)
MCV: 88 fL (ref 80.0–100.0)
Platelets: 225 K/uL (ref 150–400)
RBC: 4.1 MIL/uL — ABNORMAL LOW (ref 4.22–5.81)
RDW: 14.4 % (ref 11.5–15.5)
WBC: 5.3 K/uL (ref 4.0–10.5)
nRBC: 0 % (ref 0.0–0.2)

## 2024-05-07 LAB — BASIC METABOLIC PANEL WITH GFR
Anion gap: 11 (ref 5–15)
BUN: 23 mg/dL (ref 8–23)
CO2: 23 mmol/L (ref 22–32)
Calcium: 9.2 mg/dL (ref 8.9–10.3)
Chloride: 107 mmol/L (ref 98–111)
Creatinine, Ser: 1.64 mg/dL — ABNORMAL HIGH (ref 0.61–1.24)
GFR, Estimated: 42 mL/min — ABNORMAL LOW (ref >60)
Glucose, Bld: 94 mg/dL (ref 70–99)
Potassium: 3.4 mmol/L — ABNORMAL LOW (ref 3.5–5.1)
Sodium: 141 mmol/L (ref 135–145)

## 2024-05-07 LAB — BRAIN NATRIURETIC PEPTIDE: B Natriuretic Peptide: 58.4 pg/mL (ref 0.0–100.0)

## 2024-05-07 NOTE — ED Provider Triage Note (Signed)
 Emergency Medicine Provider Triage Evaluation Note  MAVRIC CORTRIGHT , a 83 y.o. male  was evaluated in triage.  Pt complains of L leg swelling. L leg swelling for several months. Was seen here a week ago and was sent home. No trauma or injury recently   Review of Systems  Positive: L leg swelling  Negative:   Physical Exam  BP 123/64 (BP Location: Right Arm)   Pulse 64   Temp 98.7 F (37.1 C) (Oral)   Resp 18   Ht 5' 7 (1.702 m)   Wt 83 kg   SpO2 100%   BMI 28.66 kg/m  Gen:   Awake, no distress   Resp:  Normal effort  MSK:   Moves extremities without difficulty  Other:  L pedal edema  Medical Decision Making  Medically screening exam initiated at 11:00 PM.  Appropriate orders placed.  [name] was informed that the remainder of the evaluation will be completed by another provider, this initial triage assessment does not replace that evaluation, and the importance of remaining in the ED until their evaluation is complete.  KEONTRE DEFINO is a 83 y.o. male here with L leg swelling. Will check cbc, bmp, bnp.     Patt Alm Macho, MD 05/07/24 [id number]

## 2024-05-07 NOTE — ED Triage Notes (Signed)
 Pt coming in via [facility] from home pt has some edema in both feet worse on the left. Pt states I have a hole in my foot. Pt came to the hospital for his leg swelling and his high blood pressure. Pt having pain that the stimulate in his back is not helping. Pt having pain in his left leg as well.  Ems vitals Bp 130/68 Hr 61 Spo2 98% Cbg 131

## 2024-05-08 ENCOUNTER — Emergency Department (HOSPITAL_COMMUNITY)

## 2024-05-08 MED ORDER — HYDROMORPHONE HCL 1 MG/ML IJ SOLN
0.5000 mg | Freq: Once | INTRAMUSCULAR | Status: AC
  Administered 2024-05-08: 0.5 mg via INTRAMUSCULAR
  Filled 2024-05-08: qty 1

## 2024-05-08 NOTE — ED Notes (Signed)
 Called [facility] for pt pick up

## 2024-05-08 NOTE — Discharge Instructions (Addendum)
 Return if any problems.

## 2024-05-08 NOTE — ED Provider Notes (Signed)
 [facility] Provider Note   CSN: [id number] Arrival date & time: 05/07/24  2209     Patient presents with: Leg Swelling and Back Pain   Luis Poole is a 83 y.o. male with history of anemia, CHF, CKD, diabetes, foot drop on the left, hypertension, lumbar fusion, chronic back pain with stimulator.  Patient presents to ED for evaluation of left side low back pain as well as left-sided leg swelling.  Reports that he has had leg swelling on the left side since 2012 as well as low back pain.  He reports that tonight there is no difference to his leg swelling but states that it will not go down.  He reports that typically his leg swelling does not go down even with elevation.  He reports that he is also complaining of low back pain on the left side radiating into his left lower extremity.  He denies any trauma to his low back.  He reports that he has weakness on the left side but denies any bladder bladder incontinence, groin numbness.  Denies IV drug use, fevers.  States that he does not follow with pain medicine clinic.  Patient also concerned about blood pressure.  Denies chest pain, shortness of breath, headache, blurred vision.   Back Pain      Prior to Admission medications   Medication Sig Start Date End Date Taking? Authorizing Provider  allopurinol  (ZYLOPRIM ) 100 MG tablet Take 100 mg by mouth daily.    [provider]  amLODipine  (NORVASC ) 10 MG tablet Take 0.5 tablets (5 mg total) by mouth daily. 08/12/23   Danford, Lonni SHAUNNA, MD  aspirin  EC 81 MG tablet Take 81 mg by mouth daily.    [provider]  atorvastatin  (LIPITOR) 20 MG tablet Take 1 tablet (20 mg total) by mouth daily. Patient taking differently: Take 20 mg by mouth daily as needed (High Cholesterol). 10/09/17   Jillian Buttery, MD  colchicine  0.6 MG tablet Take 1 tablet (0.6 mg total) daily by mouth. Patient taking differently: Take 0.6 mg by mouth daily as  needed (gout pain). 06/29/17   Dolan Mateo Larger, MD  cyclobenzaprine  (FLEXERIL ) 5 MG tablet Take 1 tablet (5 mg total) by mouth 2 (two) times daily as needed for muscle spasms. 03/21/24   Curatolo, Adam, DO  ferrous sulfate  325 (65 FE) MG tablet Take 325 mg by mouth daily with breakfast.    [provider]  lidocaine  (LIDODERM ) 5 % Place 1 patch onto the skin daily. Remove & Discard patch within 12 hours or as directed by MD 03/31/24   Barrett, Jamie N, PA-C  meclizine  (ANTIVERT ) 12.5 MG tablet Take 1 tablet (12.5 mg total) by mouth 3 (three) times daily as needed for dizziness. 08/02/23   Ula Prentice SAUNDERS, MD  methylPREDNISolone  (MEDROL  DOSEPAK) 4 MG TBPK tablet Follow package insert 03/21/24   Curatolo, Adam, DO  oxyCODONE  (ROXICODONE ) 5 MG immediate release tablet Take 1 tablet (5 mg total) by mouth every 6 (six) hours as needed for severe pain (pain score 7-10). 04/11/24   Henderly, Britni A, PA-C  oxyCODONE -acetaminophen  (PERCOCET/ROXICET) 5-325 MG tablet Take 1 tablet by mouth every 8 (eight) hours as needed for severe pain (pain score 7-10). 04/03/24   Elnor Savant A, DO  polyethylene glycol (MIRALAX  / GLYCOLAX ) 17 g packet Take 17 g by mouth daily as needed for mild constipation. 11/29/22   Rizwan, Saima, MD  valsartan  (DIOVAN ) 160 MG tablet Take 1  tablet (160 mg total) by mouth daily. 04/10/23   Lucien Orren SAILOR, PA-C    Allergies: Ibuprofen , Lisinopril, Pregabalin , and Gabapentin     Review of Systems  Cardiovascular:  Positive for leg swelling.  Musculoskeletal:  Positive for back pain.  All other systems reviewed and are negative.   Updated Vital Signs BP (!) 140/83 (BP Location: Right Arm)   Pulse (!) 56   Temp 97.6 F (36.4 C)   Resp (!) 21   Ht 5' 7 (1.702 m)   Wt 83 kg   SpO2 100%   BMI 28.66 kg/m   Physical Exam Vitals and nursing note reviewed.  Constitutional:      General: He is not in acute distress.    Appearance: He is well-developed.  HENT:     Head:  Normocephalic and atraumatic.  Eyes:     Conjunctiva/sclera: Conjunctivae normal.  Cardiovascular:     Rate and Rhythm: Normal rate and regular rhythm.     Heart sounds: No murmur heard.    Comments: Left lower extremity edema present, 1+ nonpitting Pulmonary:     Effort: Pulmonary effort is normal. No respiratory distress.     Breath sounds: Normal breath sounds.  Abdominal:     Palpations: Abdomen is soft.     Tenderness: There is no abdominal tenderness.  Musculoskeletal:        General: No swelling.     Cervical back: Neck supple.       Back:     Left lower leg: 1+ Edema present.  Skin:    General: Skin is warm and dry.     Capillary Refill: Capillary refill takes less than 2 seconds.  Neurological:     Mental Status: He is alert and oriented to person, place, and time. Mental status is at baseline.     Comments: 5 out of 5 strength to right lower extremity.  2 out of 5 strength left lower extremity.   Psychiatric:        Mood and Affect: Mood normal.     (all labs ordered are listed, but only abnormal results are displayed) Labs Reviewed  BASIC METABOLIC PANEL WITH GFR - Abnormal; Notable for the following components:      Result Value   Potassium 3.4 (*)    Creatinine, Ser 1.64 (*)    GFR, Estimated 42 (*)    All other components within normal limits  CBC - Abnormal; Notable for the following components:   RBC 4.10 (*)    Hemoglobin 11.5 (*)    HCT 36.1 (*)    All other components within normal limits  BRAIN NATRIURETIC PEPTIDE    EKG: None  Radiology: DG Chest 2 View Result Date: 05/07/2024 CLINICAL DATA:  Chest pain, back pain EXAM: CHEST - 2 VIEW COMPARISON:  01/07/2024 FINDINGS: Moderate-sized hiatal hernia. Heart and mediastinal contours are within normal limits. No focal opacities or effusions. No acute bony abnormality. Spinal stimulator noted in the midthoracic spine. IMPRESSION: No active cardiopulmonary disease. Electronically Signed   By: Franky Crease M.D.   On: 05/07/2024 22:50     Procedures   Medications Ordered in the ED  HYDROmorphone  (DILAUDID ) injection 0.5 mg (0.5 mg Intramuscular Given 05/08/24 0545)     Medical Decision Making Amount and/or Complexity of Data Reviewed Labs: ordered. Radiology: ordered.   This is an 83 year old presenting to the ED out of concern of low back pain, left leg swelling.  He reports that his left leg is swollen  since 2012.  He denies any new features tonight.  He has complains of left side low back pain that has been present since 2012, he has a spinal stimulator in place.  He reports that over the last 1 week his low back pain has worsened and he reports new leg weakness in the left leg.  He denies fevers or IV drug use.  He denies chest pain or shortness of breath.  He denies any falls.  He denies any bowel or bladder incontinence or groin numbness.  Patient does have 2 out of 5 strength left lower extremity.  5 out of 5 strength right lower extremity.  Tenderness to left paralumbar spinal area however no centralized lumbar spinal tenderness.  Due to weakness in left leg as well as low back pain, have ordered MRI patient back at this time.  Labs are collected to include CBC which is unremarkable without leukocytosis, baseline hemoglobin.  BMP with baseline creatinine, potassium 3.4, BNP not elevated.  Chest x-ray unremarkable without effusions.  MRI ordered at this time.  At end of shift patient workup not complete.  Signed out to oncoming provider [facility].  Plan of management discussed.   Final diagnoses:  Chronic left-sided low back pain with left-sided sciatica    ED Discharge Orders     None          Ruthell Lonni FALCON, PA-C 05/08/24 0700    Palumbo, April, MD 05/08/24 [phone]

## 2024-05-08 NOTE — ED Provider Notes (Signed)
 Patient states that he cannot have an MRI.  Patient's spinal stimulator cannot be turned off today.  Patient states that he has been told in the past that he could not have an MRI.  He states he does not want to have an MRI because it could kill him.  Patient states that he is pain is similar to his chronic pain.  Patient states he is in a wheelchair at home he is able to stand and get in and out of his wheelchair.  Patient states he does not ambulate.  He reports his leg does feel weaker than usual.  He denies any loss of bowel control.  Patient is chronically incontinent.  I discussed with patient his recent CT scans and the extent of his chronic back disease.  Patient has an appointment to follow-up with Dr. Pura.  Patient states that he is comfortable going home.  He has people who check on him and a son who lives with him.  He does not need anything for pain.  Patient does request transportation by ambulance home.  Patient discharged in stable condition.   Luis Sonny POUR, PA-C 05/08/24 [id number]    Dean Clarity, MD 05/08/24 1425

## 2024-05-11 ENCOUNTER — Emergency Department (HOSPITAL_COMMUNITY)
Admission: EM | Admit: 2024-05-11 | Discharge: 2024-05-12 | Disposition: A | Discharge status: Home / Self Care | Attending: Emergency Medicine | Admitting: Emergency Medicine

## 2024-05-11 DIAGNOSIS — Z7982 Long term (current) use of aspirin: Secondary | ICD-10-CM | POA: Insufficient documentation

## 2024-05-11 DIAGNOSIS — K2970 Gastritis, unspecified, without bleeding: Secondary | ICD-10-CM | POA: Insufficient documentation

## 2024-05-11 DIAGNOSIS — R7989 Other specified abnormal findings of blood chemistry: Secondary | ICD-10-CM | POA: Insufficient documentation

## 2024-05-11 DIAGNOSIS — R1084 Generalized abdominal pain: Secondary | ICD-10-CM | POA: Diagnosis present

## 2024-05-11 LAB — CBC
HCT: 33.7 % — ABNORMAL LOW (ref 39.0–52.0)
Hemoglobin: 10.5 g/dL — ABNORMAL LOW (ref 13.0–17.0)
MCH: 27.9 pg (ref 26.0–34.0)
MCHC: 31.2 g/dL (ref 30.0–36.0)
MCV: 89.4 fL (ref 80.0–100.0)
Platelets: 209 K/uL (ref 150–400)
RBC: 3.77 MIL/uL — ABNORMAL LOW (ref 4.22–5.81)
RDW: 14.5 % (ref 11.5–15.5)
WBC: 5.3 K/uL (ref 4.0–10.5)
nRBC: 0 % (ref 0.0–0.2)

## 2024-05-11 LAB — COMPREHENSIVE METABOLIC PANEL WITH GFR
ALT: 10 U/L (ref 0–44)
AST: 23 U/L (ref 15–41)
Albumin: 3.8 g/dL (ref 3.5–5.0)
Alkaline Phosphatase: 89 U/L (ref 38–126)
Anion gap: 13 (ref 5–15)
BUN: 16 mg/dL (ref 8–23)
CO2: 20 mmol/L — ABNORMAL LOW (ref 22–32)
Calcium: 9.1 mg/dL (ref 8.9–10.3)
Chloride: 107 mmol/L (ref 98–111)
Creatinine, Ser: 1.3 mg/dL — ABNORMAL HIGH (ref 0.61–1.24)
GFR, Estimated: 55 mL/min — ABNORMAL LOW (ref >60)
Glucose, Bld: 110 mg/dL — ABNORMAL HIGH (ref 70–99)
Potassium: 3.5 mmol/L (ref 3.5–5.1)
Sodium: 140 mmol/L (ref 135–145)
Total Bilirubin: 0.4 mg/dL (ref 0.0–1.2)
Total Protein: 7.1 g/dL (ref 6.5–8.1)

## 2024-05-11 LAB — LIPASE, BLOOD: Lipase: 25 U/L (ref 11–51)

## 2024-05-11 MED ORDER — ONDANSETRON 4 MG PO TBDP: 4.0000 mg | ORAL_TABLET | Freq: Once | ORAL | Status: DC | PRN

## 2024-05-11 NOTE — ED Triage Notes (Signed)
 Pt BIB [facility] c/o generalized abdominal pain that started today around 9 am. Pain worsening lower abd and radiating bilateral flanks. Denies urinary s/s. No NVD. Reports having to strain for a BM this morning.   [facility] VS 148/72, 18, 98% RA, PR 74

## 2024-05-12 ENCOUNTER — Emergency Department (HOSPITAL_COMMUNITY)

## 2024-05-12 LAB — URINALYSIS, ROUTINE W REFLEX MICROSCOPIC
Bilirubin Urine: NEGATIVE
Glucose, UA: NEGATIVE mg/dL
Hgb urine dipstick: NEGATIVE
Ketones, ur: NEGATIVE mg/dL
Leukocytes,Ua: NEGATIVE
Nitrite: NEGATIVE
Protein, ur: NEGATIVE mg/dL
Specific Gravity, Urine: 1.01 (ref 1.005–1.030)
pH: 5.5 (ref 5.0–8.0)

## 2024-05-12 MED ORDER — OMEPRAZOLE 40 MG PO CPDR: 40.0000 mg | DELAYED_RELEASE_CAPSULE | Freq: Every day | ORAL | 0 refills | Status: DC

## 2024-05-12 MED ORDER — ALUM & MAG HYDROXIDE-SIMETH 200-200-20 MG/5ML PO SUSP
30.0000 mL | Freq: Once | ORAL | Status: AC
  Administered 2024-05-12: 30 mL via ORAL
  Filled 2024-05-12: qty 30

## 2024-05-12 NOTE — ED Notes (Signed)
 PTAR @0401 
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
## 2024-05-18 ENCOUNTER — Emergency Department (HOSPITAL_COMMUNITY)
Admission: EM | Admit: 2024-05-18 | Discharge: 2024-05-19 | Disposition: A | Discharge status: Home / Self Care | Attending: Emergency Medicine | Admitting: Emergency Medicine

## 2024-05-18 ENCOUNTER — Other Ambulatory Visit: Payer: Self-pay

## 2024-05-18 DIAGNOSIS — G8929 Other chronic pain: Secondary | ICD-10-CM | POA: Insufficient documentation

## 2024-05-18 DIAGNOSIS — Z7982 Long term (current) use of aspirin: Secondary | ICD-10-CM | POA: Insufficient documentation

## 2024-05-18 DIAGNOSIS — I509 Heart failure, unspecified: Secondary | ICD-10-CM | POA: Diagnosis not present

## 2024-05-18 DIAGNOSIS — M5442 Lumbago with sciatica, left side: Secondary | ICD-10-CM | POA: Insufficient documentation

## 2024-05-18 DIAGNOSIS — M25551 Pain in right hip: Secondary | ICD-10-CM | POA: Insufficient documentation

## 2024-05-18 DIAGNOSIS — S0990XA Unspecified injury of head, initial encounter: Secondary | ICD-10-CM | POA: Insufficient documentation

## 2024-05-18 DIAGNOSIS — Z79899 Other long term (current) drug therapy: Secondary | ICD-10-CM | POA: Diagnosis not present

## 2024-05-18 DIAGNOSIS — W19XXXA Unspecified fall, initial encounter: Secondary | ICD-10-CM | POA: Insufficient documentation

## 2024-05-18 DIAGNOSIS — N189 Chronic kidney disease, unspecified: Secondary | ICD-10-CM | POA: Diagnosis not present

## 2024-05-18 DIAGNOSIS — E119 Type 2 diabetes mellitus without complications: Secondary | ICD-10-CM | POA: Diagnosis not present

## 2024-05-18 DIAGNOSIS — I130 Hypertensive heart and chronic kidney disease with heart failure and stage 1 through stage 4 chronic kidney disease, or unspecified chronic kidney disease: Secondary | ICD-10-CM | POA: Insufficient documentation
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
## 2024-05-19 ENCOUNTER — Emergency Department (HOSPITAL_COMMUNITY)

## 2024-05-19 MED ORDER — LIDOCAINE 5 % EX PTCH
1.0000 | MEDICATED_PATCH | CUTANEOUS | Status: DC
  Administered 2024-05-19: 1 via TRANSDERMAL
  Filled 2024-05-19: qty 1

## 2024-05-19 MED ORDER — ACETAMINOPHEN 325 MG PO TABS
650.0000 mg | ORAL_TABLET | Freq: Once | ORAL | Status: AC
  Administered 2024-05-19: 650 mg via ORAL
  Filled 2024-05-19: qty 2

## 2024-05-19 NOTE — Discharge Instructions (Addendum)
 As discussed, you will need to follow-up with your neurosurgeon or whatever provider performed back surgery on you in the past.  Seek emergency care if experiencing any new or worsening symptoms.  Lumbar spine xray results: No evidence of lumbar spine fractures. L5-S1 fusion hardware with 6 mm of lucency adjacent the left S1 pedicle screw consistent with loosening.

## 2024-05-19 NOTE — ED Notes (Signed)
 Ptar set up

## 2024-05-19 NOTE — ED Notes (Signed)
Pt provided a ginger ale and crackers.
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
## 2024-05-22 ENCOUNTER — Emergency Department (HOSPITAL_COMMUNITY)

## 2024-05-22 ENCOUNTER — Emergency Department (HOSPITAL_COMMUNITY)
Admission: EM | Admit: 2024-05-22 | Discharge: 2024-05-23 | Disposition: A | Attending: Emergency Medicine | Admitting: Emergency Medicine

## 2024-05-22 ENCOUNTER — Other Ambulatory Visit: Payer: Self-pay

## 2024-05-22 ENCOUNTER — Encounter (HOSPITAL_COMMUNITY): Payer: Self-pay

## 2024-05-22 DIAGNOSIS — Z7982 Long term (current) use of aspirin: Secondary | ICD-10-CM | POA: Insufficient documentation

## 2024-05-22 DIAGNOSIS — K4090 Unilateral inguinal hernia, without obstruction or gangrene, not specified as recurrent: Secondary | ICD-10-CM | POA: Insufficient documentation

## 2024-05-22 DIAGNOSIS — Z79899 Other long term (current) drug therapy: Secondary | ICD-10-CM | POA: Insufficient documentation

## 2024-05-22 DIAGNOSIS — E1122 Type 2 diabetes mellitus with diabetic chronic kidney disease: Secondary | ICD-10-CM | POA: Insufficient documentation

## 2024-05-22 DIAGNOSIS — K449 Diaphragmatic hernia without obstruction or gangrene: Secondary | ICD-10-CM | POA: Diagnosis not present

## 2024-05-22 DIAGNOSIS — I509 Heart failure, unspecified: Secondary | ICD-10-CM | POA: Diagnosis not present

## 2024-05-22 DIAGNOSIS — I130 Hypertensive heart and chronic kidney disease with heart failure and stage 1 through stage 4 chronic kidney disease, or unspecified chronic kidney disease: Secondary | ICD-10-CM | POA: Insufficient documentation

## 2024-05-22 DIAGNOSIS — R1013 Epigastric pain: Secondary | ICD-10-CM | POA: Diagnosis present

## 2024-05-22 DIAGNOSIS — N189 Chronic kidney disease, unspecified: Secondary | ICD-10-CM | POA: Insufficient documentation

## 2024-05-22 DIAGNOSIS — R079 Chest pain, unspecified: Secondary | ICD-10-CM

## 2024-05-22 LAB — BASIC METABOLIC PANEL WITH GFR
Anion gap: 10 (ref 5–15)
BUN: 11 mg/dL (ref 8–23)
CO2: 21 mmol/L — ABNORMAL LOW (ref 22–32)
Calcium: 9 mg/dL (ref 8.9–10.3)
Chloride: 108 mmol/L (ref 98–111)
Creatinine, Ser: 1.04 mg/dL (ref 0.61–1.24)
GFR, Estimated: >60 mL/min (ref >60)
Glucose, Bld: 95 mg/dL (ref 70–99)
Potassium: 3.7 mmol/L (ref 3.5–5.1)
Sodium: 139 mmol/L (ref 135–145)

## 2024-05-22 LAB — CBC
HCT: 35.6 % — ABNORMAL LOW (ref 39.0–52.0)
Hemoglobin: 11.3 g/dL — ABNORMAL LOW (ref 13.0–17.0)
MCH: 28.2 pg (ref 26.0–34.0)
MCHC: 31.7 g/dL (ref 30.0–36.0)
MCV: 88.8 fL (ref 80.0–100.0)
Platelets: 191 K/uL (ref 150–400)
RBC: 4.01 MIL/uL — ABNORMAL LOW (ref 4.22–5.81)
RDW: 14.3 % (ref 11.5–15.5)
WBC: 4.5 K/uL (ref 4.0–10.5)
nRBC: 0 % (ref 0.0–0.2)

## 2024-05-22 LAB — TROPONIN I (HIGH SENSITIVITY): Troponin I (High Sensitivity): 7 ng/L (ref <18)

## 2024-05-22 NOTE — ED Provider Notes (Signed)
 [place] EMERGENCY DEPARTMENT AT [facility] Provider Note   CSN: [id number] Arrival date & time: 05/22/24  2235     Patient presents with: Chest Pain   Luis Poole is a 83 y.o. male.  {Add pertinent medical, surgical, social history, OB history to HPI:32947}  Chest Pain Associated symptoms: shortness of breath      83 year old male with medical history significant for HTN, DM 2, CVA, CHF, CKD, PAD presenting to the emergency department with sudden onset chest pain that came on suddenly at around 5 PM.  The patient states that he was having an argument with his family and then went outside and sat down and suddenly experienced epigastric pain.  He initially described it as a burning GERD like sensation in his epigastrium.  He also stated that it was swollen somewhat ripping and tearing.  It did radiate to the back and between his shoulder blades.  He had associated nausea.  It radiated up to both sides of his neck as well and to the left side of his chest.  He endorsed shortness of breath.  On arrival his pain was 7 out of 10.  He took nitroglycerin sublingual as well as aspirin  324 mg given by EMS.  He states that the chest discomfort has eased off some but still persists in the epigastrium.  Prior to Admission medications   Medication Sig Start Date End Date Taking? Authorizing Provider  allopurinol  (ZYLOPRIM ) 100 MG tablet Take 100 mg by mouth daily.    [provider]  amLODipine  (NORVASC ) 10 MG tablet Take 0.5 tablets (5 mg total) by mouth daily. 08/12/23   Danford, Lonni SHAUNNA, MD  aspirin  EC 81 MG tablet Take 81 mg by mouth daily.    [provider]  atorvastatin  (LIPITOR) 20 MG tablet Take 1 tablet (20 mg total) by mouth daily. Patient taking differently: Take 20 mg by mouth daily as needed (High Cholesterol). 10/09/17   Jillian Buttery, MD  colchicine  0.6 MG tablet Take 1 tablet (0.6 mg total) daily by mouth. Patient taking differently: Take 0.6  mg by mouth daily as needed (gout pain). 06/29/17   Dolan Mateo Larger, MD  cyclobenzaprine  (FLEXERIL ) 5 MG tablet Take 1 tablet (5 mg total) by mouth 2 (two) times daily as needed for muscle spasms. 03/21/24   Curatolo, Adam, DO  ferrous sulfate  325 (65 FE) MG tablet Take 325 mg by mouth daily with breakfast.    [provider]  lidocaine  (LIDODERM ) 5 % Place 1 patch onto the skin daily. Remove & Discard patch within 12 hours or as directed by MD 03/31/24   Barrett, Jamie N, PA-C  meclizine  (ANTIVERT ) 12.5 MG tablet Take 1 tablet (12.5 mg total) by mouth 3 (three) times daily as needed for dizziness. 08/02/23   Ula Prentice SAUNDERS, MD  methylPREDNISolone  (MEDROL  DOSEPAK) 4 MG TBPK tablet Follow package insert 03/21/24   Curatolo, Adam, DO  omeprazole  (PRILOSEC) 40 MG capsule Take 1 capsule (40 mg total) by mouth daily. 05/12/24   Palumbo, April, MD  oxyCODONE  (ROXICODONE ) 5 MG immediate release tablet Take 1 tablet (5 mg total) by mouth every 6 (six) hours as needed for severe pain (pain score 7-10). 04/11/24   Henderly, Britni A, PA-C  oxyCODONE -acetaminophen  (PERCOCET/ROXICET) 5-325 MG tablet Take 1 tablet by mouth every 8 (eight) hours as needed for severe pain (pain score 7-10). 04/03/24   Elnor Savant A, DO  polyethylene glycol (MIRALAX  / GLYCOLAX ) 17 g packet Take 17 g  by mouth daily as needed for mild constipation. 11/29/22   Rizwan, Saima, MD  valsartan  (DIOVAN ) 160 MG tablet Take 1 tablet (160 mg total) by mouth daily. 04/10/23   Lucien Orren SAILOR, PA-C    Allergies: Ibuprofen , Lisinopril, Pregabalin , and Gabapentin     Review of Systems  Respiratory:  Positive for shortness of breath.   Cardiovascular:  Positive for chest pain.  All other systems reviewed and are negative.   Updated Vital Signs BP (!) 153/80 (BP Location: Right Arm)   Pulse 65   Temp 98 F (36.7 C) (Oral)   Resp (!) 8   Ht 5' 7 (1.702 m)   Wt 89.8 kg   SpO2 100%   BMI 31.01 kg/m   Physical Exam Vitals and  nursing note reviewed.  Constitutional:      General: He is not in acute distress.    Appearance: He is well-developed.  HENT:     Head: Normocephalic and atraumatic.  Eyes:     Conjunctiva/sclera: Conjunctivae normal.  Cardiovascular:     Rate and Rhythm: Normal rate and regular rhythm.     Pulses: Normal pulses.  Pulmonary:     Effort: Pulmonary effort is normal. No respiratory distress.     Breath sounds: Normal breath sounds.  Abdominal:     Palpations: Abdomen is soft.     Tenderness: There is abdominal tenderness in the epigastric area.     Comments: Generalized abdominal TTP with no rebound or guarding, some focality to the epigastrium  Musculoskeletal:        General: No swelling.     Cervical back: Neck supple.  Skin:    General: Skin is warm and dry.     Capillary Refill: Capillary refill takes less than 2 seconds.  Neurological:     Mental Status: He is alert.  Psychiatric:        Mood and Affect: Mood normal.     (all labs ordered are listed, but only abnormal results are displayed) Labs Reviewed  BASIC METABOLIC PANEL WITH GFR  CBC  TROPONIN I (HIGH SENSITIVITY)    EKG: EKG Interpretation Date/Time:  Saturday May 22 2024 22:43:07 EDT Ventricular Rate:  66 PR Interval:  219 QRS Duration:  158 QT Interval:  425 QTC Calculation: 446 R Axis:   63  Text Interpretation: Sinus rhythm Borderline prolonged PR interval Right bundle branch block No significant change since last tracing No STEMI Confirmed by Jerrol Agent ([id number]) on 05/22/2024 11:00:45 PM  Radiology: No results found.  {Document cardiac monitor, telemetry assessment procedure when appropriate:32947} Procedures   Medications Ordered in the ED - No data to display    {Click here for ABCD2, HEART and other calculators REFRESH Note before signing:1}                              Medical Decision Making Amount and/or Complexity of Data Reviewed Labs: ordered. Radiology: ordered.      83 year old male with medical history significant for HTN, DM 2, CVA, CHF, CKD, PAD presenting to the emergency department with sudden onset chest pain that came on suddenly at around 5 PM.  The patient states that he was having an argument with his family and then went outside and sat down and suddenly experienced epigastric pain.  He initially described it as a burning GERD like sensation in his epigastrium.  He also stated that it was swollen somewhat ripping and tearing.  It did radiate to the back and between his shoulder blades.  He had associated nausea.  It radiated up to both sides of his neck as well and to the left side of his chest.  He endorsed shortness of breath.  On arrival his pain was 7 out of 10.  He took nitroglycerin sublingual as well as aspirin  324 mg given by EMS.  He states that the chest discomfort has eased off some but still persists in the epigastrium.  ***  {Document critical care time when appropriate  Document review of labs and clinical decision tools ie CHADS2VASC2, etc  Document your independent review of radiology images and any outside records  Document your discussion with family members, caretakers and with consultants  Document social determinants of health affecting pt's care  Document your decision making why or why not admission, treatments were needed:32947:::1}   Final diagnoses:  None    ED Discharge Orders     None

## 2024-05-22 NOTE — ED Triage Notes (Signed)
 Pt bib[facility] from home c/o chest pain starting at 5 pm. States he was sitting down having an argument with family, went outside and thern started feeling pan in chest radiating to epigastric area, left side of chest, both shoulders and both sides of neck. Pt currently rates pain 7/10. 0.5 mg nitroglycerin, 324mg  asa  given with ems  Bp 120/90 Hr 60 99% RA

## 2024-05-23 LAB — HEPATIC FUNCTION PANEL
ALT: 10 U/L (ref 0–44)
AST: 18 U/L (ref 15–41)
Albumin: 3.4 g/dL — ABNORMAL LOW (ref 3.5–5.0)
Alkaline Phosphatase: 63 U/L (ref 38–126)
Bilirubin, Direct: <0.1 mg/dL (ref 0.0–0.2)
Total Bilirubin: 0.7 mg/dL (ref 0.0–1.2)
Total Protein: 7.4 g/dL (ref 6.5–8.1)

## 2024-05-23 LAB — TROPONIN I (HIGH SENSITIVITY): Troponin I (High Sensitivity): 7 ng/L (ref <18)

## 2024-05-23 LAB — LIPASE, BLOOD: Lipase: 26 U/L (ref 11–51)

## 2024-05-23 MED ORDER — MORPHINE SULFATE (PF) 4 MG/ML IV SOLN
4.0000 mg | Freq: Once | INTRAVENOUS | Status: AC
  Administered 2024-05-23: 4 mg via INTRAVENOUS
  Filled 2024-05-23: qty 1

## 2024-05-23 MED ORDER — LIDOCAINE VISCOUS HCL 2 % MT SOLN
15.0000 mL | Freq: Once | OROMUCOSAL | Status: AC
  Administered 2024-05-23: 15 mL via ORAL
  Filled 2024-05-23: qty 15

## 2024-05-23 MED ORDER — ALUM & MAG HYDROXIDE-SIMETH 200-200-20 MG/5ML PO SUSP
30.0000 mL | Freq: Once | ORAL | Status: AC
  Administered 2024-05-23: 30 mL via ORAL
  Filled 2024-05-23: qty 30

## 2024-05-23 MED ORDER — IOHEXOL 350 MG/ML SOLN
100.0000 mL | Freq: Once | INTRAVENOUS | Status: AC | PRN
  Administered 2024-05-23: 100 mL via INTRAVENOUS

## 2024-05-23 NOTE — ED Notes (Signed)
 [facility] called for transport.

## 2024-05-23 NOTE — Discharge Instructions (Addendum)
 Your workup was overall reassuring with normal cardiac enzymes and reassuring CT imaging.  Your CT imaging did show evidence of a hiatal hernia which can sometimes cause pain and discomfort.  Additionally reflux can cause discomfort as you described.  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  Follow-up with your primary care provider for continued outpatient management, return for any severe worsening symptoms.

## 2024-05-24 ENCOUNTER — Other Ambulatory Visit: Payer: Self-pay

## 2024-05-24 ENCOUNTER — Emergency Department (HOSPITAL_COMMUNITY)
Admission: EM | Admit: 2024-05-24 | Discharge: 2024-05-25 | Disposition: A | Discharge status: Home / Self Care | Attending: Emergency Medicine | Admitting: Emergency Medicine

## 2024-05-24 DIAGNOSIS — Z7982 Long term (current) use of aspirin: Secondary | ICD-10-CM | POA: Diagnosis not present

## 2024-05-24 DIAGNOSIS — I509 Heart failure, unspecified: Secondary | ICD-10-CM | POA: Insufficient documentation

## 2024-05-24 DIAGNOSIS — M5442 Lumbago with sciatica, left side: Secondary | ICD-10-CM | POA: Diagnosis present

## 2024-05-24 DIAGNOSIS — I130 Hypertensive heart and chronic kidney disease with heart failure and stage 1 through stage 4 chronic kidney disease, or unspecified chronic kidney disease: Secondary | ICD-10-CM | POA: Diagnosis not present

## 2024-05-24 DIAGNOSIS — N183 Chronic kidney disease, stage 3 unspecified: Secondary | ICD-10-CM | POA: Diagnosis not present

## 2024-05-24 DIAGNOSIS — Z79899 Other long term (current) drug therapy: Secondary | ICD-10-CM | POA: Diagnosis not present

## 2024-05-24 DIAGNOSIS — Z8673 Personal history of transient ischemic attack (TIA), and cerebral infarction without residual deficits: Secondary | ICD-10-CM | POA: Insufficient documentation

## 2024-05-24 DIAGNOSIS — E1122 Type 2 diabetes mellitus with diabetic chronic kidney disease: Secondary | ICD-10-CM | POA: Insufficient documentation

## 2024-05-24 DIAGNOSIS — G8929 Other chronic pain: Secondary | ICD-10-CM | POA: Diagnosis not present

## 2024-05-24 NOTE — ED Triage Notes (Signed)
 Pt BIB [facility] c/o worsening chronic back pain. Sts back pain mid upper between the shoulders down into his lower back. Sts this pain has been ongoing since 2012. Worsening since a fall when missing his wheelchair approx 3 weeks ago. No recent injury/fall.

## 2024-05-25 MED ORDER — CYCLOBENZAPRINE HCL 5 MG PO TABS: 5.0000 mg | ORAL_TABLET | Freq: Two times a day (BID) | ORAL | 0 refills | Status: AC | PRN

## 2024-05-25 MED ORDER — LIDOCAINE 5 % EX PTCH
1.0000 | MEDICATED_PATCH | CUTANEOUS | Status: DC
  Administered 2024-05-25: 1 via TRANSDERMAL
  Filled 2024-05-25: qty 1

## 2024-05-25 MED ORDER — OXYCODONE HCL 5 MG PO TABS: 2.5000 mg | ORAL_TABLET | Freq: Four times a day (QID) | ORAL | 0 refills | Status: AC | PRN

## 2024-05-25 MED ORDER — OXYCODONE-ACETAMINOPHEN 5-325 MG PO TABS
1.0000 | ORAL_TABLET | ORAL | Status: DC | PRN
  Administered 2024-05-25: 1 via ORAL
  Filled 2024-05-25: qty 1

## 2024-05-25 MED ORDER — DEXAMETHASONE SODIUM PHOSPHATE 4 MG/ML IJ SOLN
4.0000 mg | Freq: Once | INTRAMUSCULAR | Status: AC
  Administered 2024-05-25: 4 mg via INTRAMUSCULAR
  Filled 2024-05-25: qty 1

## 2024-05-25 NOTE — ED Provider Notes (Signed)
 [place] EMERGENCY DEPARTMENT AT [facility] Provider Note  CSN: [id number] Arrival date & time: 05/24/24 1952  Chief Complaint(s) Back Pain  HPI Luis Poole is a 83 y.o. male with a past medical history listed below including chronic back pain here for exacerbation of his pain.  Patient denies any recent falls other than a fall several weeks ago where he had a x-ray showing loosening of his hardware from lumbar fusion.  He was trying to get an appointment with a spine doctor.  Patient reports that while sitting in his wheelchair and bending forward, he felt a back spasm causing shooting pain down his left leg.  He denies any lower extremity weakness.  No bladder/bowel incontinence.  No fevers.  No dysuria.   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 angina pectoris 08/12/2023   Pressure injury of skin 08/12/2023   Hypokalemia 08/12/2023   AKI (acute kidney injury) 08/11/2023   Near syncope 11/26/2022   Syncope 10/06/2017   Chest pain 10/06/2017   Low back pain 06/28/2017   Leg swelling 08/23/2013   Left leg swelling 08/23/2013   Neuropathy 08/23/2013   Acute gout 08/23/2013   D-dimer, elevated 08/23/2013   CKD (chronic kidney disease), stage III (HCC) 08/23/2013   Chronic diastolic heart failure (HCC) 08/23/2013   Normocytic anemia 06/08/2013   Hypertension 08/29/2012   Diabetes mellitus type 2 in nonobese (HCC) 08/29/2012   History of CVA (cerebrovascular  accident) 08/28/2012    Class: Acute   L-S radiculopathy 08/28/2012   Lumbar post-laminectomy syndrome 01/15/2012   Thoracic or lumbosacral neuritis or radiculitis, unspecified 01/15/2012   Left hemiparesis (HCC) 01/15/2012   Diabetic peripheral neuropathy (HCC) 01/15/2012   Home Medication(s) Prior to Admission medications   Medication Sig Start Date End Date Taking? Authorizing Provider  oxyCODONE  (ROXICODONE ) 5 MG immediate release tablet Take 0.5-1 tablets (2.5-5 mg total) by mouth every 6 (six) hours as needed for up to 5 days for severe pain (pain score 7-10). 05/25/24 05/30/24 Yes Shadoe Bethel, Raynell Moder, MD  allopurinol  (ZYLOPRIM ) 100 MG tablet Take 100 mg by mouth daily.    [provider]  amLODipine  (NORVASC ) 10 MG tablet Take 0.5 tablets (5 mg total) by mouth daily. 08/12/23   Danford, Lonni SHAUNNA, MD  aspirin  EC 81 MG tablet Take 81 mg by mouth daily.    [provider]  atorvastatin  (LIPITOR) 20 MG tablet Take 1 tablet (20 mg total) by mouth daily. Patient taking differently: Take 20 mg by mouth daily as needed (High Cholesterol). 10/09/17   Jillian Buttery, MD  colchicine  0.6 MG tablet Take 1 tablet (0.6 mg total) daily by mouth. Patient taking differently: Take 0.6 mg by mouth daily as needed (gout pain). 06/29/17   Dolan Mateo Larger, MD  cyclobenzaprine  (FLEXERIL ) 5 MG tablet Take 1 tablet (5 mg total) by mouth 2 (two) times daily as needed  for muscle spasms. 05/25/24   Trine Raynell Moder, MD  ferrous sulfate  325 (65 FE) MG tablet Take 325 mg by mouth daily with breakfast.    [provider]  lidocaine  (LIDODERM ) 5 % Place 1 patch onto the skin daily. Remove & Discard patch within 12 hours or as directed by MD 03/31/24   Barrett, Jamie N, PA-C  meclizine  (ANTIVERT ) 12.5 MG tablet Take 1 tablet (12.5 mg total) by mouth 3 (three) times daily as needed for dizziness. 08/02/23   Ula Prentice SAUNDERS, MD  methylPREDNISolone  (MEDROL  DOSEPAK) 4 MG TBPK tablet  Follow package insert 03/21/24   Curatolo, Adam, DO  omeprazole  (PRILOSEC) 40 MG capsule Take 1 capsule (40 mg total) by mouth daily. 05/12/24   Palumbo, April, MD  oxyCODONE -acetaminophen  (PERCOCET/ROXICET) 5-325 MG tablet Take 1 tablet by mouth every 8 (eight) hours as needed for severe pain (pain score 7-10). 04/03/24   Elnor Savant A, DO  polyethylene glycol (MIRALAX  / GLYCOLAX ) 17 g packet Take 17 g by mouth daily as needed for mild constipation. 11/29/22   Rizwan, Saima, MD  valsartan  (DIOVAN ) 160 MG tablet Take 1 tablet (160 mg total) by mouth daily. 04/10/23   Lucien Orren SAILOR, PA-C                                                                                                                                    Allergies Ibuprofen , Lisinopril, Pregabalin , and Gabapentin   Review of Systems Review of Systems  Musculoskeletal:  Positive for back pain.   As noted in HPI  Physical Exam Vital Signs  I have reviewed the triage vital signs BP (!) 158/83   Pulse 71   Temp 98.2 F (36.8 C) (Oral)   Resp 14   SpO2 100%   Physical Exam Vitals reviewed.  Constitutional:      General: He is not in acute distress.    Appearance: He is well-developed. He is not diaphoretic.  HENT:     Head: Normocephalic and atraumatic.     Right Ear: External ear normal.     Left Ear: External ear normal.     Nose: Nose normal.     Mouth/Throat:     Mouth: Mucous membranes are moist.  Eyes:     General: No scleral icterus.    Conjunctiva/sclera: Conjunctivae normal.  Neck:     Trachea: Phonation normal.  Cardiovascular:     Rate and Rhythm: Normal rate and regular rhythm.  Pulmonary:     Effort: Pulmonary effort is normal. No respiratory distress.     Breath sounds: No stridor.  Abdominal:     General: There is no distension.  Musculoskeletal:        General: Normal range of motion.     Cervical back: Normal range of motion.     Comments: Spine Exam: Left lumbar paraspinal muscles are tender to  palpation. Strength: 5/5 in RLE. Chronic 4/5  strength in LLE.   Sensation: Intact to light touch in proximal and distal LE bilaterally    Neurological:     Mental Status: He is alert and oriented to person, place, and time.  Psychiatric:        Behavior: Behavior normal.     ED Results and Treatments Labs (all labs ordered are listed, but only abnormal results are displayed) Labs Reviewed - No data to display                                                                                                                       EKG  EKG Interpretation Date/Time:    Ventricular Rate:    PR Interval:    QRS Duration:    QT Interval:    QTC Calculation:   R Axis:      Text Interpretation:         Radiology No results found.  Medications Ordered in ED Medications  oxyCODONE -acetaminophen  (PERCOCET/ROXICET) 5-325 MG per tablet 1 tablet (1 tablet Oral Given 05/25/24 0205)  lidocaine  (LIDODERM ) 5 % 1 patch (1 patch Transdermal Patch Applied 05/25/24 0540)  dexamethasone (DECADRON) injection 4 mg (4 mg Intramuscular Given 05/25/24 0540)   Procedures Procedures  (including critical care time) Medical Decision Making / ED Course   Medical Decision Making Risk Prescription drug management.    Patient presents with exacerbation of lower back pain.  No recent trauma.  No red flags concerning for cauda equina, requiring advanced imaging.  Pain improved with oral meds, IM steroids and Lidoderm  patch.  Patient already attempting to follow-up with spine surgeon.    Final Clinical Impression(s) / ED Diagnoses Final diagnoses:  Chronic bilateral low back pain with left-sided sciatica   The patient appears reasonably screened and/or stabilized for discharge and I doubt any other medical condition or other [facility] requiring further screening, evaluation, or treatment in the ED at this time. I have discussed the findings, Dx and Tx plan with the patient/family who expressed understanding  and agree(s) with the plan. Discharge instructions discussed at length. The patient/family was given strict return precautions who verbalized understanding of the instructions. No further questions at time of discharge.  Disposition: Discharge  Condition: Good  ED Discharge Orders          Ordered    oxyCODONE  (ROXICODONE ) 5 MG immediate release tablet  Every 6 hours PRN        05/25/24 0710    cyclobenzaprine  (FLEXERIL ) 5 MG tablet  2 times daily PRN        05/25/24 0710            [place]  narcotic database reviewed and no active prescriptions noted.   Follow Up: Pura Lenis, MD [address] [place] [address] [phone]        This chart was dictated using voice recognition software.  Despite best efforts to proofread,  errors can occur which can change the documentation meaning.  Trine Raynell Moder, MD 05/25/24 [phone]

## 2024-05-25 NOTE — Discharge Instructions (Addendum)
 For pain control you may take 1000 mg of Tylenol every 8 hours scheduled.  In addition you can take 0.5 to 1 tablet of Oxycodone every 6 hours as needed for pain not controlled with the scheduled Tylenol.

## 2024-05-27 ENCOUNTER — Other Ambulatory Visit: Payer: Self-pay

## 2024-05-27 ENCOUNTER — Encounter (HOSPITAL_COMMUNITY): Payer: Self-pay

## 2024-05-27 ENCOUNTER — Emergency Department (HOSPITAL_COMMUNITY)
Admission: EM | Admit: 2024-05-27 | Discharge: 2024-05-28 | Disposition: A | Discharge status: Home / Self Care | Attending: Emergency Medicine | Admitting: Emergency Medicine

## 2024-05-27 DIAGNOSIS — M545 Low back pain, unspecified: Secondary | ICD-10-CM | POA: Insufficient documentation

## 2024-05-27 DIAGNOSIS — Z7982 Long term (current) use of aspirin: Secondary | ICD-10-CM | POA: Diagnosis not present

## 2024-05-27 DIAGNOSIS — G8929 Other chronic pain: Secondary | ICD-10-CM | POA: Diagnosis not present

## 2024-05-27 NOTE — ED Triage Notes (Signed)
 BIB EMS from home for chronic back pain. Pt states there have been no changes in back pain, pt son called EMS and wanted him evaluated.

## 2024-05-27 NOTE — ED Provider Notes (Signed)
 [place] EMERGENCY DEPARTMENT AT [facility] Provider Note   CSN: [id number] Arrival date & time: 05/27/24  1734     Patient presents with: Back Pain   Luis Poole is a 83 y.o. male.   This is a 83 year old male here today because his son was concerned about him having pain in his back.  The patient says that he bent over, and had a small amount of pain in his back, it stopped, it since resolved.  He did not want to come to the emergency room but his son insisted.  He denies any complaints at this time.   Back Pain      Prior to Admission medications   Medication Sig Start Date End Date Taking? Authorizing Provider  allopurinol  (ZYLOPRIM ) 100 MG tablet Take 100 mg by mouth daily.    [provider]  amLODipine  (NORVASC ) 10 MG tablet Take 0.5 tablets (5 mg total) by mouth daily. 08/12/23   Danford, Lonni SHAUNNA, MD  aspirin  EC 81 MG tablet Take 81 mg by mouth daily.    [provider]  atorvastatin  (LIPITOR) 20 MG tablet Take 1 tablet (20 mg total) by mouth daily. Patient taking differently: Take 20 mg by mouth daily as needed (High Cholesterol). 10/09/17   Jillian Buttery, MD  colchicine  0.6 MG tablet Take 1 tablet (0.6 mg total) daily by mouth. Patient taking differently: Take 0.6 mg by mouth daily as needed (gout pain). 06/29/17   Dolan Mateo Larger, MD  cyclobenzaprine  (FLEXERIL ) 5 MG tablet Take 1 tablet (5 mg total) by mouth 2 (two) times daily as needed for muscle spasms. 05/25/24   Trine Raynell Moder, MD  ferrous sulfate  325 (65 FE) MG tablet Take 325 mg by mouth daily with breakfast.    [provider]  lidocaine  (LIDODERM ) 5 % Place 1 patch onto the skin daily. Remove & Discard patch within 12 hours or as directed by MD 03/31/24   Barrett, Jamie N, PA-C  meclizine  (ANTIVERT ) 12.5 MG tablet Take 1 tablet (12.5 mg total) by mouth 3 (three) times daily as needed for dizziness. 08/02/23   Ula Prentice SAUNDERS, MD  methylPREDNISolone   (MEDROL  DOSEPAK) 4 MG TBPK tablet Follow package insert 03/21/24   Curatolo, Adam, DO  omeprazole  (PRILOSEC) 40 MG capsule Take 1 capsule (40 mg total) by mouth daily. 05/12/24   Palumbo, April, MD  oxyCODONE  (ROXICODONE ) 5 MG immediate release tablet Take 0.5-1 tablets (2.5-5 mg total) by mouth every 6 (six) hours as needed for up to 5 days for severe pain (pain score 7-10). 05/25/24 05/30/24  Trine Raynell Moder, MD  oxyCODONE -acetaminophen  (PERCOCET/ROXICET) 5-325 MG tablet Take 1 tablet by mouth every 8 (eight) hours as needed for severe pain (pain score 7-10). 04/03/24   Elnor Savant A, DO  polyethylene glycol (MIRALAX  / GLYCOLAX ) 17 g packet Take 17 g by mouth daily as needed for mild constipation. 11/29/22   Rizwan, Saima, MD  valsartan  (DIOVAN ) 160 MG tablet Take 1 tablet (160 mg total) by mouth daily. 04/10/23   Lucien Orren SAILOR, PA-C    Allergies: Ibuprofen , Lisinopril, Pregabalin , and Gabapentin     Review of Systems  Musculoskeletal:  Positive for back pain.    Updated Vital Signs BP 132/88   Pulse (!) 58   Temp 97.8 F (36.6 C) (Oral)   Resp 16   Ht 5' 7 (1.702 m)   Wt 89.8 kg   SpO2 100%   BMI 31.01 kg/m   Physical Exam Vitals and  nursing note reviewed.  Cardiovascular:     Rate and Rhythm: Normal rate.  Musculoskeletal:        General: No swelling, tenderness or deformity. Normal range of motion.  Skin:    General: Skin is warm and dry.  Neurological:     General: No focal deficit present.     Mental Status: He is alert.     Comments: No numbness, no tingling, no saddle anesthesia.  Patient ambulatory.     (all labs ordered are listed, but only abnormal results are displayed) Labs Reviewed - No data to display  EKG: None  Radiology: No results found.   Procedures   Medications Ordered in the ED - No data to display                                  Medical Decision Making 83 year old male here today for an episode of back pain which has resolved.   Differential diagnoses include back pain, chronic back pain, less likely fracture, less likely malignancy, less likely renal pathology.    Plan -patient looks well.  He is not having any pain at this time.  Patient alert, oriented x 3.  I do not believe requires any blood work or imaging.  Will discharge patient.  He is ambulatory here in the ED.        Final diagnoses:  Chronic low back pain without sciatica, unspecified back pain laterality    ED Discharge Orders     None          Mannie Fairy DASEN, DO 05/27/24 2104

## 2024-05-27 NOTE — ED Notes (Signed)
 [facility] called

## 2024-05-27 NOTE — Discharge Instructions (Addendum)
 Follow-up with your primary care doctor regarding your back pain.

## 2024-06-01 ENCOUNTER — Emergency Department (HOSPITAL_COMMUNITY)
Admission: EM | Admit: 2024-06-01 | Discharge: 2024-06-02 | Disposition: A | Discharge status: Home / Self Care | Attending: Emergency Medicine | Admitting: Emergency Medicine

## 2024-06-01 ENCOUNTER — Other Ambulatory Visit: Payer: Self-pay

## 2024-06-01 ENCOUNTER — Encounter (HOSPITAL_COMMUNITY): Payer: Self-pay

## 2024-06-01 DIAGNOSIS — M5442 Lumbago with sciatica, left side: Secondary | ICD-10-CM | POA: Diagnosis not present

## 2024-06-01 DIAGNOSIS — M5441 Lumbago with sciatica, right side: Secondary | ICD-10-CM | POA: Insufficient documentation

## 2024-06-01 DIAGNOSIS — M5440 Lumbago with sciatica, unspecified side: Secondary | ICD-10-CM

## 2024-06-01 DIAGNOSIS — M545 Low back pain, unspecified: Secondary | ICD-10-CM | POA: Diagnosis present

## 2024-06-01 DIAGNOSIS — Z7982 Long term (current) use of aspirin: Secondary | ICD-10-CM | POA: Insufficient documentation

## 2024-06-01 MED ORDER — OXYCODONE-ACETAMINOPHEN 5-325 MG PO TABS: 1.0000 | ORAL_TABLET | Freq: Three times a day (TID) | ORAL | 0 refills | Status: DC | PRN

## 2024-06-01 MED ORDER — OXYCODONE-ACETAMINOPHEN 5-325 MG PO TABS
1.0000 | ORAL_TABLET | Freq: Once | ORAL | Status: AC
  Administered 2024-06-01: 1 via ORAL
  Filled 2024-06-01: qty 1

## 2024-06-01 NOTE — ED Triage Notes (Addendum)
 Pt BIB EMS from home with reports of lower back pain and left leg pain. Pt states that he was in an [employer] going home and had to sit in the back and he bent his leg.

## 2024-06-01 NOTE — ED Notes (Signed)
 Patient is sitting in wheelchair in room. Patient has no needs expressed at this time.

## 2024-06-01 NOTE — ED Provider Notes (Signed)
 [place] EMERGENCY DEPARTMENT AT [facility] Provider Note   CSN: [id number] Arrival date & time: 06/01/24  1820     Patient presents with: Back Pain   Luis Poole is a 83 y.o. male here with acute on chronic low back pain.  Patient was seen a few times this month after reporting having sharp radiating pain from his low back after getting into a cramped car.  He was prescribed a short course of oxycodone  for this which he completed at home and has run out of.  He says he has not been able to schedule an appointment with his orthopedic spine clinic and is frustrated because they will he try to give me injections and that does not help.  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   HPI     Prior to Admission medications   Medication Sig Start Date End Date Taking? Authorizing Provider  oxyCODONE -acetaminophen  (PERCOCET/ROXICET) 5-325 MG tablet Take 1 tablet by mouth every 8 (eight) hours as needed for up to 10 doses for severe pain (pain score 7-10). 06/01/24  Yes Cinch Ormond, Donnice PARAS, MD  allopurinol  (ZYLOPRIM ) 100 MG tablet Take 100 mg by mouth daily.    [provider]  amLODipine  (NORVASC ) 10 MG tablet Take 0.5 tablets (5 mg total) by mouth daily. 08/12/23   Danford, Lonni SHAUNNA, MD  aspirin  EC 81 MG tablet Take 81 mg by mouth daily.    [provider]  atorvastatin  (LIPITOR) 20 MG tablet Take 1 tablet (20 mg total) by mouth daily. Patient taking differently: Take 20 mg by mouth daily as needed (High Cholesterol). 10/09/17   Jillian Buttery, MD  colchicine  0.6 MG tablet Take 1 tablet (0.6 mg total) daily by mouth. Patient taking differently: Take 0.6 mg by mouth daily as needed (gout pain). 06/29/17   Dolan Mateo Larger, MD  cyclobenzaprine  (FLEXERIL ) 5 MG tablet Take 1 tablet (5 mg total) by mouth 2 (two) times daily as needed for muscle spasms. 05/25/24   Trine Raynell Moder, MD  ferrous sulfate  325 (65 FE) MG tablet  Take 325 mg by mouth daily with breakfast.    [provider]  lidocaine  (LIDODERM ) 5 % Place 1 patch onto the skin daily. Remove & Discard patch within 12 hours or as directed by MD 03/31/24   Barrett, Jamie N, PA-C  meclizine  (ANTIVERT ) 12.5 MG tablet Take 1 tablet (12.5 mg total) by mouth 3 (three) times daily as needed for dizziness. 08/02/23   Ula Prentice SAUNDERS, MD  methylPREDNISolone  (MEDROL  DOSEPAK) 4 MG TBPK tablet Follow package insert 03/21/24   Curatolo, Adam, DO  omeprazole  (PRILOSEC) 40 MG capsule Take 1 capsule (40 mg total) by mouth daily. 05/12/24   Palumbo, April, MD  oxyCODONE -acetaminophen  (PERCOCET/ROXICET) 5-325 MG tablet Take 1 tablet by mouth every 8 (eight) hours as needed for severe pain (pain score 7-10). 04/03/24   Elnor Savant A, DO  polyethylene glycol (MIRALAX  / GLYCOLAX ) 17 g packet Take 17 g by mouth daily as needed for mild constipation. 11/29/22   Rizwan, Saima, MD  valsartan  (DIOVAN ) 160 MG tablet Take 1 tablet (160 mg total) by mouth daily. 04/10/23   Lucien Orren SAILOR, PA-C    Allergies: Ibuprofen , Lisinopril, Pregabalin , and Gabapentin     Review of Systems  Updated Vital Signs BP (!) 159/75 (BP Location: Left Arm)   Pulse 60   Temp 98.3 F (36.8 C) (Oral)   Resp 18   SpO2 100%  Physical Exam Constitutional:      General: He is not in acute distress. HENT:     Head: Normocephalic and atraumatic.  Eyes:     Conjunctiva/sclera: Conjunctivae normal.     Pupils: Pupils are equal, round, and reactive to light.  Cardiovascular:     Rate and Rhythm: Normal rate and regular rhythm.  Pulmonary:     Effort: Pulmonary effort is normal. No respiratory distress.  Abdominal:     General: There is no distension.     Tenderness: There is no abdominal tenderness.  Skin:    General: Skin is warm and dry.  Neurological:     General: No focal deficit present.     Mental Status: He is alert and oriented to person, place, and time. Mental status is at baseline.   Psychiatric:        Mood and Affect: Mood normal.        Behavior: Behavior normal.     (all labs ordered are listed, but only abnormal results are displayed) Labs Reviewed - No data to display  EKG: None  Radiology: No results found.   Procedures   Medications Ordered in the ED  oxyCODONE -acetaminophen  (PERCOCET/ROXICET) 5-325 MG per tablet 1 tablet (1 tablet Oral Given 06/01/24 2101)                                    Medical Decision Making Risk Prescription drug management.   Patient is here with encounter chronic low back pain.  Low suspicion for cauda equina syndrome.  Doubt compression fracture or other traumatic injury from this mechanism.  He is here for pain control.  We discussed another short course of oxycodone , 10 tablets, but explained to him that the ED cannot prescribe long-term narcotic pain medicines, and that he is best served by following up with his prior spine clinic who knows him well.  He verbalized understanding.  Stable for discharge     Final diagnoses:  Bilateral low back pain with sciatica, sciatica laterality unspecified, unspecified chronicity    ED Discharge Orders          Ordered    oxyCODONE -acetaminophen  (PERCOCET/ROXICET) 5-325 MG tablet  Every 8 hours PRN        06/01/24 2107               Cottie Donnice PARAS, MD 06/01/24 2109

## 2024-06-02 NOTE — ED Notes (Signed)
 Declined vitals at this time.

## 2024-06-04 ENCOUNTER — Encounter (HOSPITAL_COMMUNITY): Payer: Self-pay | Admitting: Emergency Medicine

## 2024-06-04 ENCOUNTER — Emergency Department (HOSPITAL_COMMUNITY)

## 2024-06-04 ENCOUNTER — Emergency Department (HOSPITAL_COMMUNITY)
Admission: EM | Admit: 2024-06-04 | Discharge: 2024-06-05 | Disposition: A | Attending: Emergency Medicine | Admitting: Emergency Medicine

## 2024-06-04 ENCOUNTER — Other Ambulatory Visit: Payer: Self-pay

## 2024-06-04 DIAGNOSIS — Y92009 Unspecified place in unspecified non-institutional (private) residence as the place of occurrence of the external cause: Secondary | ICD-10-CM | POA: Insufficient documentation

## 2024-06-04 DIAGNOSIS — I130 Hypertensive heart and chronic kidney disease with heart failure and stage 1 through stage 4 chronic kidney disease, or unspecified chronic kidney disease: Secondary | ICD-10-CM | POA: Diagnosis not present

## 2024-06-04 DIAGNOSIS — Z7982 Long term (current) use of aspirin: Secondary | ICD-10-CM | POA: Diagnosis not present

## 2024-06-04 DIAGNOSIS — Z79899 Other long term (current) drug therapy: Secondary | ICD-10-CM | POA: Diagnosis not present

## 2024-06-04 DIAGNOSIS — Y93E5 Activity, floor mopping and cleaning: Secondary | ICD-10-CM | POA: Diagnosis not present

## 2024-06-04 DIAGNOSIS — N183 Chronic kidney disease, stage 3 unspecified: Secondary | ICD-10-CM | POA: Insufficient documentation

## 2024-06-04 DIAGNOSIS — W01198A Fall on same level from slipping, tripping and stumbling with subsequent striking against other object, initial encounter: Secondary | ICD-10-CM | POA: Insufficient documentation

## 2024-06-04 DIAGNOSIS — I509 Heart failure, unspecified: Secondary | ICD-10-CM | POA: Insufficient documentation

## 2024-06-04 DIAGNOSIS — M25552 Pain in left hip: Secondary | ICD-10-CM | POA: Insufficient documentation

## 2024-06-04 DIAGNOSIS — M545 Low back pain, unspecified: Secondary | ICD-10-CM | POA: Diagnosis not present

## 2024-06-04 DIAGNOSIS — S0990XA Unspecified injury of head, initial encounter: Secondary | ICD-10-CM | POA: Diagnosis present

## 2024-06-04 DIAGNOSIS — W19XXXA Unspecified fall, initial encounter: Secondary | ICD-10-CM

## 2024-06-04 DIAGNOSIS — E119 Type 2 diabetes mellitus without complications: Secondary | ICD-10-CM | POA: Insufficient documentation

## 2024-06-04 MED ORDER — MELOXICAM 7.5 MG PO TABS: 7.5000 mg | ORAL_TABLET | Freq: Every day | ORAL | 0 refills | Status: DC

## 2024-06-04 NOTE — ED Provider Notes (Signed)
 [place] EMERGENCY DEPARTMENT AT [facility] Provider Note   CSN: [id number] Arrival date & time: 06/04/24  1724     Patient presents with: Luis Poole is a 83 y.o. male with past medical history of CVA, T2DM, CKD stage III, HF, CAD, HTN, lumbar fusion presents to Emergency Department for evaluation of head injury, left hip pain, low back pain following fall today.  Reports that he was cleaning a pan at home when water fell out of the pan onto the floor and he slipped on it.  He fell onto his left side buttocks, and head.  No LOC, thinners, complaints prior to falls   {Add pertinent medical, surgical, social history, OB history to YEP:67052}  Fall       Prior to Admission medications   Medication Sig Start Date End Date Taking? Authorizing Provider  meloxicam (MOBIC) 7.5 MG tablet Take 1 tablet (7.5 mg total) by mouth daily. 06/04/24  Yes Minnie Tinnie BRAVO, PA  allopurinol  (ZYLOPRIM ) 100 MG tablet Take 100 mg by mouth daily.    [provider]  amLODipine  (NORVASC ) 10 MG tablet Take 0.5 tablets (5 mg total) by mouth daily. 08/12/23   Danford, Lonni SHAUNNA, MD  aspirin  EC 81 MG tablet Take 81 mg by mouth daily.    [provider]  atorvastatin  (LIPITOR) 20 MG tablet Take 1 tablet (20 mg total) by mouth daily. Patient taking differently: Take 20 mg by mouth daily as needed (High Cholesterol). 10/09/17   Jillian Buttery, MD  colchicine  0.6 MG tablet Take 1 tablet (0.6 mg total) daily by mouth. Patient taking differently: Take 0.6 mg by mouth daily as needed (gout pain). 06/29/17   Dolan Mateo Larger, MD  cyclobenzaprine  (FLEXERIL ) 5 MG tablet Take 1 tablet (5 mg total) by mouth 2 (two) times daily as needed for muscle spasms. 05/25/24   Trine Raynell Moder, MD  ferrous sulfate  325 (65 FE) MG tablet Take 325 mg by mouth daily with breakfast.    [provider]  lidocaine  (LIDODERM ) 5 % Place 1 patch onto the skin daily. Remove &  Discard patch within 12 hours or as directed by MD 03/31/24   Barrett, Jamie N, PA-C  meclizine  (ANTIVERT ) 12.5 MG tablet Take 1 tablet (12.5 mg total) by mouth 3 (three) times daily as needed for dizziness. 08/02/23   Ula Prentice SAUNDERS, MD  methylPREDNISolone  (MEDROL  DOSEPAK) 4 MG TBPK tablet Follow package insert 03/21/24   Curatolo, Adam, DO  omeprazole  (PRILOSEC) 40 MG capsule Take 1 capsule (40 mg total) by mouth daily. 05/12/24   Palumbo, April, MD  oxyCODONE -acetaminophen  (PERCOCET/ROXICET) 5-325 MG tablet Take 1 tablet by mouth every 8 (eight) hours as needed for severe pain (pain score 7-10). 04/03/24   Elnor Savant A, DO  oxyCODONE -acetaminophen  (PERCOCET/ROXICET) 5-325 MG tablet Take 1 tablet by mouth every 8 (eight) hours as needed for up to 10 doses for severe pain (pain score 7-10). 06/01/24   Cottie Donnice PARAS, MD  polyethylene glycol (MIRALAX  / GLYCOLAX ) 17 g packet Take 17 g by mouth daily as needed for mild constipation. 11/29/22   Rizwan, Saima, MD  valsartan  (DIOVAN ) 160 MG tablet Take 1 tablet (160 mg total) by mouth daily. 04/10/23   Lucien Orren SAILOR, PA-C    Allergies: Ibuprofen , Lisinopril, Pregabalin , and Gabapentin     Review of Systems  Musculoskeletal:  Positive for back pain.    Updated Vital Signs BP 121/71 (BP Location: Right Arm)   Pulse 65  Temp 98.1 F (36.7 C) (Oral)   Resp 18   Ht 5' 7 (1.702 m)   Wt 90.7 kg   SpO2 100%   BMI 31.32 kg/m   Physical Exam Vitals and nursing note reviewed.  Constitutional:      General: He is not in acute distress.    Appearance: Normal appearance. He is not ill-appearing or diaphoretic.  HENT:     Head: Normocephalic and atraumatic. No raccoon eyes or Battle's sign.     Comments: No hematoma nor TTP of cranium. No crepitus to facial bones    Right Ear: External ear normal. No hemotympanum.     Left Ear: External ear normal. No hemotympanum.     Nose: Nose normal.     Right Nostril: No epistaxis or septal hematoma.     Left  Nostril: No epistaxis or septal hematoma.     Mouth/Throat:     Mouth: Mucous membranes are moist. No injury or lacerations.  Eyes:     General: Lids are normal. Vision grossly intact. No visual field deficit.       Right eye: No discharge.        Left eye: No discharge.     Extraocular Movements: Extraocular movements intact.     Right eye: Normal extraocular motion and no nystagmus.     Left eye: Normal extraocular motion and no nystagmus.     Conjunctiva/sclera: Conjunctivae normal.     Pupils: Pupils are equal, round, and reactive to light.     Comments: No subconjunctival hemorrhage, hyphema, tear drop pupil, or fluid leakage bilaterally.  EOM intact.  Neck:     Vascular: No carotid bruit.  Cardiovascular:     Rate and Rhythm: Normal rate.     Pulses: Normal pulses.          Radial pulses are 2+ on the right side and 2+ on the left side.       Dorsalis pedis pulses are 2+ on the right side and 2+ on the left side.     Heart sounds: Normal heart sounds.  Pulmonary:     Effort: Pulmonary effort is normal. No respiratory distress.     Breath sounds: Normal breath sounds. No wheezing.  Chest:     Chest wall: No tenderness.  Abdominal:     General: Bowel sounds are normal. There is no distension.     Palpations: Abdomen is soft.     Tenderness: There is no abdominal tenderness. There is no guarding or rebound.  Musculoskeletal:     Cervical back: Full passive range of motion without pain, normal range of motion and neck supple. No deformity, rigidity or bony tenderness. Normal range of motion.     Thoracic back: No deformity or bony tenderness. Normal range of motion.     Lumbar back: Bony tenderness present. No deformity. Normal range of motion.     Right hip: No bony tenderness or crepitus.     Left hip: Bony tenderness present. No crepitus.     Right lower leg: No edema.     Left lower leg: No edema.     Comments: No obvious deformity to joints or long bones. Pelvis stable  with no shortening or rotation of LE bilaterally  Skin:    General: Skin is warm and dry.     Capillary Refill: Capillary refill takes less than 2 seconds.     Coloration: Skin is not jaundiced or pale.     Comments: No ecchymosis  nor hemorrhage to chest, abdomen, back  Neurological:     General: No focal deficit present.     Mental Status: He is alert and oriented to person, place, and time. Mental status is at baseline.     GCS: GCS eye subscore is 4. GCS verbal subscore is 5. GCS motor subscore is 6.     Cranial Nerves: Cranial nerves 2-12 are intact. No cranial nerve deficit, dysarthria or facial asymmetry.     Sensory: Sensation is intact. No sensory deficit.     Motor: Motor function is intact. No weakness, tremor, abnormal muscle tone, seizure activity or pronator drift.     Coordination: Coordination is intact. Coordination normal. Finger-Nose-Finger Test and Heel to [facility] Test normal.     Gait: Gait is intact. Gait normal.     Deep Tendon Reflexes: Reflexes are normal and symmetric. Reflexes normal.     Comments: following commands appropriately.  No slurred speech or aphasia.  Sensation 2/2 in BUE and BLE     (all labs ordered are listed, but only abnormal results are displayed) Labs Reviewed - No data to display  EKG: None  Radiology: DG Lumbar Spine Complete Result Date: 06/04/2024 EXAM: 4 VIEW(S) XRAY OF THE LUMBAR SPINE 06/04/2024 10:08:00 PM COMPARISON: X-ray left hip and pelvis 06/04/2024, CT lumbar spine 04/11/2024. CLINICAL HISTORY: TTP. Per chart - BIB EMS from home. Stepped back and sat down on a table. Leg pain bilaterally x 12 years. No head trauma. No thinners. FINDINGS: LUMBAR SPINE: BONES: L5-S1 surgical hardware with redemonstration of periprosthetic lucency along the S1 screws. Slight levocurvature of the lower lumbar spine likely positional in etiology. Poorly visualized grade 1 anterolisthesis of L5 on S1. Question T12 superior endplate compression fracture.  No aggressive appearing osseous lesion. Limited evaluation due to overlapping osseous structures and overlying soft tissues. DISCS AND DEGENERATIVE CHANGES: Multilevel moderate degenerative changes of the spine. SOFT TISSUES: Right upper quadrant surgical clips. Neurostimulator is noted overlying the upper abdomen. Chronic retained shrapnel overlying the right hip. IMPRESSION: 1. Question T12 superior endplate compression fracture. 2. Limited evaluation due to overlapping osseous structures and overlying soft tissues. 3. Poorly visualized grade 1 anterolisthesis of L4 on L5. Electronically signed by: Morgane Naveau MD 06/04/2024 10:37 PM EDT RP Workstation: [id number]   DG Hip Unilat W or Wo Pelvis 2-3 Views Left Result Date: 06/04/2024 EXAM: 2 or 3 VIEW(S) XRAY OF THE LEFT HIP 06/04/2024 10:08:00 PM COMPARISON: CT left hip 04/24/2024. X-ray pelvis 04/24/2024 CLINICAL HISTORY: ttp. Stepped back and sat down on a table. Leg pain bilaterally x 12 years. No head trauma. No thinners. FINDINGS: BONES AND JOINTS: L5-S1 surgical hardware with similar appearing periprosthetic lucency along the S1 screws. Chronic retained shrapnel overlying the right hip. No acute displaced fracture or dislocation of either hips. No acute displaced fracture or dislocation of the bones of the pelvis. Vascular calcifications. No significant degenerative changes. SOFT TISSUES: Chronic retained intraabdominal overlying the right hip. Vascular calcifications. The soft tissues are otherwise unremarkable. IMPRESSION: 1. No acute displaced fracture or dislocation of either hip or the bones of the pelvis. Electronically signed by: Morgane Naveau MD 06/04/2024 10:34 PM EDT RP Workstation: [id number]   CT Cervical Spine Wo Contrast Result Date: 06/04/2024 CLINICAL DATA:  Neck trauma, leg pain EXAM: CT CERVICAL SPINE WITHOUT CONTRAST TECHNIQUE: Multidetector CT imaging of the cervical spine was performed without intravenous contrast. Multiplanar  CT image reconstructions were also generated. RADIATION DOSE REDUCTION: This exam was performed according to the departmental  dose-optimization program which includes automated exposure control, adjustment of the mA and/or kV according to patient size and/or use of iterative reconstruction technique. COMPARISON:  05/19/2024 FINDINGS: Alignment: Stable straightening of the cervical spine from C5 through C7 related to prior discectomy infusion. Skull base and vertebrae: No acute or destructive bony abnormalities. Stable left mastoid effusion incidentally noted. Soft tissues and spinal canal: No prevertebral fluid or swelling. No visible canal hematoma. Stable atherosclerosis. Disc levels: Stable hypertrophic changes at the C1-C2 interface. Stable multilevel facet hypertrophy. Prior ACDF spanning C5 through C7, with cerclage wire seen posteriorly between the spinous processes. No change since prior exam. Upper chest: Airway is patent.  Lung apices are clear. Other: Reconstructed images demonstrate no additional findings. IMPRESSION: 1. Stable multilevel degenerative and postsurgical changes as above. 2. No acute cervical spine fracture. Electronically Signed   By: Ozell Daring M.D.   On: 06/04/2024 22:20   CT Head Wo Contrast Result Date: 06/04/2024 EXAM: CT HEAD WITHOUT CONTRAST 06/04/2024 09:48:08 PM TECHNIQUE: CT of the head was performed without the administration of intravenous contrast. Automated exposure control, iterative reconstruction, and/or weight based adjustment of the mA/kV was utilized to reduce the radiation dose to as low as reasonably achievable. COMPARISON: Comparison made with prior CT from 05/19/2024. CLINICAL HISTORY: Head trauma, moderate-severe. BIB EMS from home. Stepped back and sat down on a table. Leg pain bilaterally x 12 years. No head trauma. No thinners VSS. FINDINGS: BRAIN AND VENTRICLES: No acute hemorrhage. No evidence of acute infarct. No hydrocephalus. No extra-axial  collection. No mass effect or midline shift. Age related tubercle atrophy. Mild chronic microvascular ischemic disease. Encephalomalacia involving the peripheral seen right frontal parietal region, consistent with a chronic right ACA distribution infarct. ORBITS: No acute abnormality. SINUSES: No acute abnormality. SOFT TISSUES AND SKULL: Large left mastoid effusion. No skull fracture. IMPRESSION: 1. No acute intracranial abnormality. 2. Chronic right ACA distribution infarct. 3. Large left mastoid effusion. Electronically signed by: Morene Hoard MD 06/04/2024 10:17 PM EDT RP Workstation: [id number]    {Document cardiac monitor, telemetry assessment procedure when appropriate:32947} .Ortho Injury Treatment  Date/Time: 06/04/2024 11:58 PM  Performed by: Minnie Tinnie BRAVO, PA Authorized by: Minnie Tinnie BRAVO, PA   Consent:    Consent obtained:  Verbal   Consent given by:  Patient   Risks discussed:  Fracture   Alternatives discussed:  No treatmentInjury location: spine Pre-procedure neurovascular assessment: neurovascularly intact Pre-procedure distal perfusion: normal Pre-procedure neurological function: normal Pre-procedure range of motion: normal Post-procedure neurovascular assessment: post-procedure neurovascularly intact      Medications Ordered in the ED - No data to display    {Click here for ABCD2, HEART and other calculators REFRESH Note before signing:1}                              Medical Decision Making Amount and/or Complexity of Data Reviewed Radiology: ordered.   Patient presents to the ED for concern of left hip pain, head injury following fall today, this involves an extensive number of treatment options, and is a complaint that carries with it a high risk of complications and morbidity.  The differential diagnosis includes fracture, contusion, dislocation, ICH, neck injury   Co morbidities that complicate the patient evaluation  See HPI   Additional  history obtained:  Additional history obtained from  Nursing   External records from outside source obtained and reviewed including triage note    Imaging Studies ordered:  I ordered imaging studies including CT head, cervical spine, right hip x-ray, lumbar spine x-ray I independently visualized and interpreted imaging which showed  CT: No acute intracranial abnormality. Chronic right ACA distribution infarct. Large left mastoid effusion - similar to previous CT No acute cervical spine fracture Hip XR No acute displaced fracture or dislocation of either hip or the bones of the pelvis Lumbar spine x-ray Question T12 superior endplate compression fracture I agree with the radiologist interpretation See individual report for full report    Medicines ordered and prescription drug management:  I ordered medication including meloxicam  for pain  Reevaluation of the patient after these medicines showed that the patient stayed the same I have reviewed the patients home medicines and have made adjustments as needed     Problem List / ED Course:  Fall at home Mechanical nature with no complaints prior.  Slipped on water.  Do not feel that lab work is required.  Overall is well-appearing with vital signs hemodynamically stable with no fever no tachycardia nor hypotension Will provide meloxicam for anti-inflammatory properties, pain management at home Patient does have frequent falls and frequent ED visits.  He states that he is working on home health with PCP.  Does live with son at home  Minor head injury No signs of basilar skull injury.  No signs of traumatic injury to head.  No Battle sign nor raccoon eyes CT head and cervical spine without acute traumatic injury. Neurologically intact with no motor or neurosensory deficits. A&Ox3.  No complaints of dizziness, lightheadedness, visual disturbances Vital signs hemodynamically stable  Lumbar spine pain No complaints of saddle  paresthesia, urinary incontinence.  No history of IVDU nor malignancy.  No fever here in Emergency Department.  Low suspicion for cauda equina, epidural abscess X-ray questions T12 superior endplate compression fracture.  Patient is neurologically intact with no sensory nor motor deficits to BLE.  Patient has no complaints of sensory nor motor deficits to BLE.   Will provide patient with LSO and have him follow up with his spine surgeon whom he is very well established with   Reevaluation:  After the interventions noted above, I reevaluated the patient and found that they have :improved    Dispostion:  After consideration of the diagnostic results and the patients response to treatment, I feel that the patent would benefit from outpatient management with symptomatic care and PCP follow-up as.   Discussed ED workup, disposition, return to ED precautions with patient who expresses understanding agrees with plan.  All questions answered to their satisfaction.  They are agreeable to plan.  Discharge instructions provided on paperwork  Final diagnoses:  Fall in home, initial encounter  Minor head injury, initial encounter  Lumbar spine pain    ED Discharge Orders          Ordered    meloxicam (MOBIC) 7.5 MG tablet  Daily        06/04/24 2352

## 2024-06-04 NOTE — ED Triage Notes (Signed)
 BIB EMS from home.  Stepped back and sat down on a table.  Leg pain bilaterally x 12 years.  No head trauma. No thinners VSS

## 2024-06-05 NOTE — Progress Notes (Signed)
 Orthopedic Tech Progress Note Patient Details:  Luis Poole 12/04/1940 [id number] RN requested [facility] LSO. Brought it over from [place] to patient in triage.  Ortho Devices Type of Ortho Device: Lumbar corsett Ortho Device/Splint Location: L SPINE Ortho Device/Splint Interventions: Ordered, Application, Adjustment   Post Interventions Instructions Provided: Care of device  [name] L Johnnie Goynes 06/05/2024, 2:33 AM

## 2024-06-05 NOTE — ED Notes (Signed)
 [facility] called for transport.

## 2024-06-05 NOTE — Discharge Instructions (Signed)
 [facility] evaluate you today.  Your CT imaging did not show any bleeding on your brain or injury to your neck.  Your left hip is without fracture or dislocation.  Your x-ray of your lower spine did show questionable fracture.  We have given you a brace to wear.  Please follow-up with your spine surgeon.  Return to Emergency Department if you experience loss of urinary continence, loss of sensation in genital region, inability of your legs, inability to walk, worsening symptoms

## 2024-06-07 ENCOUNTER — Inpatient Hospital Stay (HOSPITAL_COMMUNITY)
Admission: EM | Admit: 2024-06-07 | Discharge: 2024-06-18 | DRG: 322 | Disposition: A | Discharge status: Skilled Nursing Facility | Attending: Student | Admitting: Student

## 2024-06-07 ENCOUNTER — Other Ambulatory Visit: Payer: Self-pay

## 2024-06-07 ENCOUNTER — Emergency Department (HOSPITAL_COMMUNITY)

## 2024-06-07 DIAGNOSIS — E7841 Elevated Lipoprotein(a): Secondary | ICD-10-CM | POA: Diagnosis present

## 2024-06-07 DIAGNOSIS — Z7982 Long term (current) use of aspirin: Secondary | ICD-10-CM

## 2024-06-07 DIAGNOSIS — Z823 Family history of stroke: Secondary | ICD-10-CM

## 2024-06-07 DIAGNOSIS — R42 Dizziness and giddiness: Secondary | ICD-10-CM | POA: Diagnosis not present

## 2024-06-07 DIAGNOSIS — I440 Atrioventricular block, first degree: Secondary | ICD-10-CM | POA: Diagnosis present

## 2024-06-07 DIAGNOSIS — N182 Chronic kidney disease, stage 2 (mild): Secondary | ICD-10-CM | POA: Diagnosis present

## 2024-06-07 DIAGNOSIS — Z981 Arthrodesis status: Secondary | ICD-10-CM

## 2024-06-07 DIAGNOSIS — K219 Gastro-esophageal reflux disease without esophagitis: Secondary | ICD-10-CM | POA: Diagnosis present

## 2024-06-07 DIAGNOSIS — Q250 Patent ductus arteriosus: Secondary | ICD-10-CM

## 2024-06-07 DIAGNOSIS — Z993 Dependence on wheelchair: Secondary | ICD-10-CM

## 2024-06-07 DIAGNOSIS — I2510 Atherosclerotic heart disease of native coronary artery without angina pectoris: Secondary | ICD-10-CM | POA: Diagnosis present

## 2024-06-07 DIAGNOSIS — I5032 Chronic diastolic (congestive) heart failure: Secondary | ICD-10-CM | POA: Diagnosis present

## 2024-06-07 DIAGNOSIS — I214 Non-ST elevation (NSTEMI) myocardial infarction: Secondary | ICD-10-CM | POA: Diagnosis not present

## 2024-06-07 DIAGNOSIS — Z9181 History of falling: Secondary | ICD-10-CM

## 2024-06-07 DIAGNOSIS — R262 Difficulty in walking, not elsewhere classified: Secondary | ICD-10-CM | POA: Diagnosis present

## 2024-06-07 DIAGNOSIS — I455 Other specified heart block: Secondary | ICD-10-CM | POA: Diagnosis present

## 2024-06-07 DIAGNOSIS — Z7902 Long term (current) use of antithrombotics/antiplatelets: Secondary | ICD-10-CM

## 2024-06-07 DIAGNOSIS — Z7409 Other reduced mobility: Secondary | ICD-10-CM | POA: Diagnosis present

## 2024-06-07 DIAGNOSIS — E1151 Type 2 diabetes mellitus with diabetic peripheral angiopathy without gangrene: Secondary | ICD-10-CM | POA: Diagnosis present

## 2024-06-07 DIAGNOSIS — Z8249 Family history of ischemic heart disease and other diseases of the circulatory system: Secondary | ICD-10-CM

## 2024-06-07 DIAGNOSIS — Z87891 Personal history of nicotine dependence: Secondary | ICD-10-CM

## 2024-06-07 DIAGNOSIS — I10 Essential (primary) hypertension: Secondary | ICD-10-CM | POA: Diagnosis present

## 2024-06-07 DIAGNOSIS — G8929 Other chronic pain: Secondary | ICD-10-CM | POA: Diagnosis present

## 2024-06-07 DIAGNOSIS — I130 Hypertensive heart and chronic kidney disease with heart failure and stage 1 through stage 4 chronic kidney disease, or unspecified chronic kidney disease: Secondary | ICD-10-CM | POA: Diagnosis present

## 2024-06-07 DIAGNOSIS — Z8673 Personal history of transient ischemic attack (TIA), and cerebral infarction without residual deficits: Secondary | ICD-10-CM

## 2024-06-07 DIAGNOSIS — E876 Hypokalemia: Secondary | ICD-10-CM | POA: Diagnosis present

## 2024-06-07 DIAGNOSIS — I4510 Unspecified right bundle-branch block: Secondary | ICD-10-CM | POA: Diagnosis present

## 2024-06-07 DIAGNOSIS — M109 Gout, unspecified: Secondary | ICD-10-CM | POA: Diagnosis present

## 2024-06-07 DIAGNOSIS — R531 Weakness: Secondary | ICD-10-CM | POA: Diagnosis present

## 2024-06-07 DIAGNOSIS — R079 Chest pain, unspecified: Secondary | ICD-10-CM | POA: Diagnosis present

## 2024-06-07 DIAGNOSIS — I252 Old myocardial infarction: Secondary | ICD-10-CM

## 2024-06-07 DIAGNOSIS — Z955 Presence of coronary angioplasty implant and graft: Secondary | ICD-10-CM

## 2024-06-07 DIAGNOSIS — E1122 Type 2 diabetes mellitus with diabetic chronic kidney disease: Secondary | ICD-10-CM | POA: Diagnosis present

## 2024-06-07 DIAGNOSIS — Z79899 Other long term (current) drug therapy: Secondary | ICD-10-CM

## 2024-06-07 LAB — CBC
HCT: 36.4 % — ABNORMAL LOW (ref 39.0–52.0)
Hemoglobin: 11.7 g/dL — ABNORMAL LOW (ref 13.0–17.0)
MCH: 28.4 pg (ref 26.0–34.0)
MCHC: 32.1 g/dL (ref 30.0–36.0)
MCV: 88.3 fL (ref 80.0–100.0)
Platelets: 202 K/uL (ref 150–400)
RBC: 4.12 MIL/uL — ABNORMAL LOW (ref 4.22–5.81)
RDW: 14 % (ref 11.5–15.5)
WBC: 4.4 K/uL (ref 4.0–10.5)
nRBC: 0 % (ref 0.0–0.2)

## 2024-06-07 LAB — BASIC METABOLIC PANEL WITH GFR
Anion gap: 12 (ref 5–15)
BUN: 16 mg/dL (ref 8–23)
CO2: 21 mmol/L — ABNORMAL LOW (ref 22–32)
Calcium: 9.1 mg/dL (ref 8.9–10.3)
Chloride: 105 mmol/L (ref 98–111)
Creatinine, Ser: 1.24 mg/dL (ref 0.61–1.24)
GFR, Estimated: 58 mL/min — ABNORMAL LOW (ref >60)
Glucose, Bld: 110 mg/dL — ABNORMAL HIGH (ref 70–99)
Potassium: 3.5 mmol/L (ref 3.5–5.1)
Sodium: 138 mmol/L (ref 135–145)

## 2024-06-07 LAB — TROPONIN I (HIGH SENSITIVITY): Troponin I (High Sensitivity): 20 ng/L — ABNORMAL HIGH (ref <18)

## 2024-06-07 LAB — CBG MONITORING, ED: Glucose-Capillary: 96 mg/dL (ref 70–99)
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
## 2024-06-08 ENCOUNTER — Encounter (HOSPITAL_COMMUNITY): Payer: Self-pay | Admitting: Family Medicine

## 2024-06-08 ENCOUNTER — Encounter (HOSPITAL_COMMUNITY)
Admission: EM | Disposition: A | Discharge status: Skilled Nursing Facility | Payer: Self-pay | Source: Home / Self Care | Attending: Family Medicine

## 2024-06-08 ENCOUNTER — Observation Stay (HOSPITAL_COMMUNITY)

## 2024-06-08 DIAGNOSIS — R079 Chest pain, unspecified: Secondary | ICD-10-CM

## 2024-06-08 DIAGNOSIS — R7989 Other specified abnormal findings of blood chemistry: Secondary | ICD-10-CM

## 2024-06-08 DIAGNOSIS — I214 Non-ST elevation (NSTEMI) myocardial infarction: Secondary | ICD-10-CM | POA: Diagnosis not present

## 2024-06-08 DIAGNOSIS — I2510 Atherosclerotic heart disease of native coronary artery without angina pectoris: Secondary | ICD-10-CM

## 2024-06-08 DIAGNOSIS — G8929 Other chronic pain: Secondary | ICD-10-CM

## 2024-06-08 DIAGNOSIS — Z8673 Personal history of transient ischemic attack (TIA), and cerebral infarction without residual deficits: Secondary | ICD-10-CM

## 2024-06-08 DIAGNOSIS — I10 Essential (primary) hypertension: Secondary | ICD-10-CM | POA: Diagnosis not present

## 2024-06-08 HISTORY — PX: LEFT HEART CATH AND CORONARY ANGIOGRAPHY: CATH118249

## 2024-06-08 LAB — ECHOCARDIOGRAM LIMITED
AR max vel: 2.86 cm2
AV Area VTI: 2.74 cm2
AV Area mean vel: 2.87 cm2
AV Mean grad: 4 mmHg
AV Peak grad: 6.9 mmHg
Ao pk vel: 1.31 m/s
Height: 67 in
S' Lateral: 3.2 cm
Weight: 3120 [oz_av]

## 2024-06-08 LAB — LIPID PANEL
Cholesterol: 159 mg/dL (ref 0–200)
HDL: 40 mg/dL — ABNORMAL LOW (ref >40)
LDL Cholesterol: 113 mg/dL — ABNORMAL HIGH (ref 0–99)
Total CHOL/HDL Ratio: 4 ratio
Triglycerides: 28 mg/dL (ref <150)
VLDL: 6 mg/dL (ref 0–40)

## 2024-06-08 LAB — BASIC METABOLIC PANEL WITH GFR
Anion gap: 8 (ref 5–15)
BUN: 13 mg/dL (ref 8–23)
CO2: 22 mmol/L (ref 22–32)
Calcium: 8.5 mg/dL — ABNORMAL LOW (ref 8.9–10.3)
Chloride: 107 mmol/L (ref 98–111)
Creatinine, Ser: 1.06 mg/dL (ref 0.61–1.24)
GFR, Estimated: >60 mL/min (ref >60)
Glucose, Bld: 105 mg/dL — ABNORMAL HIGH (ref 70–99)
Potassium: 3.2 mmol/L — ABNORMAL LOW (ref 3.5–5.1)
Sodium: 137 mmol/L (ref 135–145)

## 2024-06-08 LAB — TROPONIN I (HIGH SENSITIVITY): Troponin I (High Sensitivity): 116 ng/L (ref <18)

## 2024-06-08 LAB — HEPARIN LEVEL (UNFRACTIONATED): Heparin Unfractionated: 0.44 [IU]/mL (ref 0.30–0.70)

## 2024-06-08 SURGERY — LEFT HEART CATH AND CORONARY ANGIOGRAPHY
Anesthesia: LOCAL

## 2024-06-08 MED ORDER — VERAPAMIL HCL 2.5 MG/ML IV SOLN
INTRAVENOUS | Status: AC
  Filled 2024-06-08: qty 2

## 2024-06-08 MED ORDER — ATORVASTATIN CALCIUM 40 MG PO TABS
40.0000 mg | ORAL_TABLET | Freq: Every day | ORAL | Status: DC
  Administered 2024-06-08 – 2024-06-09 (×2): 40 mg via ORAL
  Filled 2024-06-08 (×2): qty 1

## 2024-06-08 MED ORDER — HEPARIN (PORCINE) 25000 UT/250ML-% IV SOLN
1100.0000 [IU]/h | INTRAVENOUS | Status: DC
  Administered 2024-06-08: 1100 [IU]/h via INTRAVENOUS
  Filled 2024-06-08: qty 250

## 2024-06-08 MED ORDER — HEPARIN SODIUM (PORCINE) 1000 UNIT/ML IJ SOLN
INTRAMUSCULAR | Status: AC
  Filled 2024-06-08: qty 10

## 2024-06-08 MED ORDER — FENTANYL CITRATE (PF) 100 MCG/2ML IJ SOLN
INTRAMUSCULAR | Status: AC
  Filled 2024-06-08: qty 2

## 2024-06-08 MED ORDER — VERAPAMIL HCL 2.5 MG/ML IV SOLN
INTRAVENOUS | Status: DC | PRN
  Administered 2024-06-08: 10 mL via INTRA_ARTERIAL

## 2024-06-08 MED ORDER — POTASSIUM CHLORIDE CRYS ER 20 MEQ PO TBCR
40.0000 meq | EXTENDED_RELEASE_TABLET | Freq: Once | ORAL | Status: AC
  Administered 2024-06-08: 40 meq via ORAL
  Filled 2024-06-08: qty 2

## 2024-06-08 MED ORDER — LIDOCAINE HCL (PF) 1 % IJ SOLN
INTRAMUSCULAR | Status: AC
Start: 2024-06-08 — End: 2024-06-08
  Filled 2024-06-08: qty 30

## 2024-06-08 MED ORDER — SODIUM CHLORIDE 0.9% FLUSH
3.0000 mL | Freq: Two times a day (BID) | INTRAVENOUS | Status: DC
  Administered 2024-06-08 – 2024-06-18 (×13): 3 mL via INTRAVENOUS

## 2024-06-08 MED ORDER — MIDAZOLAM HCL 2 MG/2ML IJ SOLN
INTRAMUSCULAR | Status: AC
  Filled 2024-06-08: qty 2

## 2024-06-08 MED ORDER — ONDANSETRON HCL 4 MG/2ML IJ SOLN
4.0000 mg | Freq: Four times a day (QID) | INTRAMUSCULAR | Status: DC | PRN
  Administered 2024-06-11 – 2024-06-15 (×2): 4 mg via INTRAVENOUS
  Filled 2024-06-08 (×2): qty 2

## 2024-06-08 MED ORDER — OXYCODONE-ACETAMINOPHEN 5-325 MG PO TABS
1.0000 | ORAL_TABLET | Freq: Three times a day (TID) | ORAL | Status: DC | PRN
Start: 2024-06-08 — End: 2024-06-08

## 2024-06-08 MED ORDER — FENTANYL CITRATE (PF) 100 MCG/2ML IJ SOLN
INTRAMUSCULAR | Status: DC | PRN
  Administered 2024-06-08: 25 ug via INTRAVENOUS

## 2024-06-08 MED ORDER — ASPIRIN 81 MG PO TBEC
81.0000 mg | DELAYED_RELEASE_TABLET | Freq: Every day | ORAL | Status: DC
  Administered 2024-06-08 – 2024-06-18 (×11): 81 mg via ORAL
  Filled 2024-06-08 (×11): qty 1

## 2024-06-08 MED ORDER — HEPARIN BOLUS VIA INFUSION
4000.0000 [IU] | Freq: Once | INTRAVENOUS | Status: AC
  Administered 2024-06-08: 4000 [IU] via INTRAVENOUS
  Filled 2024-06-08: qty 4000

## 2024-06-08 MED ORDER — HEPARIN SODIUM (PORCINE) 1000 UNIT/ML IJ SOLN
INTRAMUSCULAR | Status: DC | PRN
  Administered 2024-06-08: 4000 [IU] via INTRAVENOUS

## 2024-06-08 MED ORDER — IRBESARTAN 150 MG PO TABS
300.0000 mg | ORAL_TABLET | Freq: Every day | ORAL | Status: DC
  Administered 2024-06-08 – 2024-06-18 (×11): 300 mg via ORAL
  Filled 2024-06-08: qty 1
  Filled 2024-06-08 (×3): qty 2
  Filled 2024-06-08: qty 1
  Filled 2024-06-08 (×2): qty 2
  Filled 2024-06-08: qty 1
  Filled 2024-06-08: qty 2
  Filled 2024-06-08: qty 1
  Filled 2024-06-08: qty 2

## 2024-06-08 MED ORDER — ASPIRIN 81 MG PO CHEW
324.0000 mg | CHEWABLE_TABLET | ORAL | Status: AC
  Administered 2024-06-08: 324 mg via ORAL
  Filled 2024-06-08: qty 4

## 2024-06-08 MED ORDER — SODIUM CHLORIDE 0.9% FLUSH: 3.0000 mL | INTRAVENOUS | Status: DC | PRN

## 2024-06-08 MED ORDER — NITROGLYCERIN 0.4 MG SL SUBL: 0.4000 mg | SUBLINGUAL_TABLET | SUBLINGUAL | Status: DC | PRN

## 2024-06-08 MED ORDER — FREE WATER
500.0000 mL | Freq: Once | Status: AC
  Administered 2024-06-08: 500 mL via ORAL

## 2024-06-08 MED ORDER — AMLODIPINE BESYLATE 5 MG PO TABS
5.0000 mg | ORAL_TABLET | Freq: Every day | ORAL | Status: DC
  Administered 2024-06-08 – 2024-06-18 (×11): 5 mg via ORAL
  Filled 2024-06-08 (×11): qty 1

## 2024-06-08 MED ORDER — HEPARIN (PORCINE) 25000 UT/250ML-% IV SOLN: 12.0000 [IU]/kg/h | INTRAVENOUS | Status: DC

## 2024-06-08 MED ORDER — HEPARIN (PORCINE) 25000 UT/250ML-% IV SOLN
1100.0000 [IU]/h | INTRAVENOUS | Status: DC
  Administered 2024-06-08 – 2024-06-09 (×2): 1100 [IU]/h via INTRAVENOUS
  Administered 2024-06-10: 1000 [IU]/h via INTRAVENOUS
  Filled 2024-06-08 (×3): qty 250

## 2024-06-08 MED ORDER — OXYCODONE HCL 5 MG PO TABS
5.0000 mg | ORAL_TABLET | ORAL | Status: DC | PRN
Start: 2024-06-08 — End: 2024-06-16
  Administered 2024-06-12 – 2024-06-14 (×3): 5 mg via ORAL
  Filled 2024-06-08 (×6): qty 1

## 2024-06-08 MED ORDER — SODIUM CHLORIDE 0.9 % IV SOLN: 250.0000 mL | INTRAVENOUS | Status: AC | PRN

## 2024-06-08 MED ORDER — LIDOCAINE HCL (PF) 1 % IJ SOLN
INTRAMUSCULAR | Status: DC | PRN
  Administered 2024-06-08: 5 mL

## 2024-06-08 MED ORDER — IOHEXOL 350 MG/ML SOLN
INTRAVENOUS | Status: DC | PRN
  Administered 2024-06-08: 50 mL via INTRA_ARTERIAL

## 2024-06-08 MED ORDER — POTASSIUM CHLORIDE CRYS ER 10 MEQ PO TBCR: 10.0000 meq | EXTENDED_RELEASE_TABLET | Freq: Every day | ORAL | Status: DC

## 2024-06-08 MED ORDER — NITROGLYCERIN 2 % TD OINT
0.5000 [in_us] | TOPICAL_OINTMENT | Freq: Once | TRANSDERMAL | Status: AC
  Administered 2024-06-08: 0.5 [in_us] via TOPICAL
  Filled 2024-06-08: qty 1

## 2024-06-08 MED ORDER — CYCLOBENZAPRINE HCL 10 MG PO TABS
5.0000 mg | ORAL_TABLET | Freq: Two times a day (BID) | ORAL | Status: DC | PRN
  Administered 2024-06-13: 5 mg via ORAL
  Filled 2024-06-08: qty 1

## 2024-06-08 MED ORDER — LIDOCAINE 5 % EX PTCH
1.0000 | MEDICATED_PATCH | CUTANEOUS | Status: DC
  Administered 2024-06-08 – 2024-06-18 (×11): 1 via TRANSDERMAL
  Filled 2024-06-08 (×11): qty 1

## 2024-06-08 MED ORDER — MIDAZOLAM HCL (PF) 2 MG/2ML IJ SOLN
INTRAMUSCULAR | Status: DC | PRN
  Administered 2024-06-08: 2 mg via INTRAVENOUS

## 2024-06-08 MED ORDER — ASPIRIN 300 MG RE SUPP: 300.0000 mg | RECTAL | Status: AC

## 2024-06-08 MED ORDER — POLYETHYLENE GLYCOL 3350 17 G PO PACK
17.0000 g | PACK | Freq: Every day | ORAL | Status: DC | PRN
  Administered 2024-06-09: 17 g via ORAL
  Filled 2024-06-08: qty 1

## 2024-06-08 MED ORDER — ASPIRIN 81 MG PO TBEC
81.0000 mg | DELAYED_RELEASE_TABLET | Freq: Every day | ORAL | Status: DC
Start: 2024-06-09 — End: 2024-06-08

## 2024-06-08 MED ORDER — PANTOPRAZOLE SODIUM 40 MG PO TBEC
40.0000 mg | DELAYED_RELEASE_TABLET | Freq: Every day | ORAL | Status: DC
  Administered 2024-06-08 – 2024-06-18 (×11): 40 mg via ORAL
  Filled 2024-06-08 (×11): qty 1

## 2024-06-08 MED ORDER — CYCLOBENZAPRINE HCL 10 MG PO TABS: 5.0000 mg | ORAL_TABLET | Freq: Two times a day (BID) | ORAL | Status: DC | PRN

## 2024-06-08 MED ORDER — ACETAMINOPHEN 325 MG PO TABS
650.0000 mg | ORAL_TABLET | ORAL | Status: DC | PRN
  Administered 2024-06-15: 650 mg via ORAL
  Filled 2024-06-08 (×2): qty 2

## 2024-06-08 SURGICAL SUPPLY — 7 items
CATH INFINITI AMBI 5FR JK (CATHETERS) IMPLANT
CATH INFINITI JR4 5F (CATHETERS) IMPLANT
DEVICE RAD COMP TR BAND LRG (VASCULAR PRODUCTS) IMPLANT
GLIDESHEATH SLEND A-KIT 6F 22G (SHEATH) IMPLANT
GUIDEWIRE INQWIRE 1.5J.035X260 (WIRE) IMPLANT
PACK CARDIAC CATHETERIZATION (CUSTOM PROCEDURE TRAY) ×1 IMPLANT
SET ATX-X65L (MISCELLANEOUS) IMPLANT

## 2024-06-08 NOTE — Interval H&P Note (Signed)
 History and Physical Interval Note:  06/08/2024 3:26 PM  Luis Poole  has presented today for surgery, with the diagnosis of nstemi.  The various methods of treatment have been discussed with the patient and family. After consideration of risks, benefits and other options for treatment, the patient has consented to  Procedure(s): LEFT HEART CATH AND CORONARY ANGIOGRAPHY (N/A) and possible coronary intervention for NSTEMI as a surgical intervention.  The patient's history has been reviewed, patient examined, no change in status, stable for surgery.  I have reviewed the patient's chart and labs.  Questions were answered to the patient's satisfaction.     Gordy Bergamo

## 2024-06-08 NOTE — ED Provider Notes (Signed)
 Received  Physical Exam  BP 139/79   Pulse 71   Resp 15   Ht 5' 7 (1.702 m)   Wt 88.5 kg   SpO2 100%   BMI 30.54 kg/m   Physical Exam Vitals and nursing note reviewed.  Constitutional:      General: He is not in acute distress.    Appearance: Normal appearance. He is well-developed. He is not diaphoretic.  HENT:     Head: Normocephalic and atraumatic.     Nose: Nose normal.  Eyes:     Conjunctiva/sclera: Conjunctivae normal.     Pupils: Pupils are equal, round, and reactive to light.  Cardiovascular:     Rate and Rhythm: Normal rate and regular rhythm.     Pulses: Normal pulses.     Heart sounds: Normal heart sounds.  Pulmonary:     Effort: Pulmonary effort is normal.     Breath sounds: Normal breath sounds. No wheezing or rales.  Abdominal:     General: Bowel sounds are normal.     Palpations: Abdomen is soft.     Tenderness: There is no abdominal tenderness. There is no guarding or rebound.  Musculoskeletal:        General: Normal range of motion.     Cervical back: Normal range of motion and neck supple.  Skin:    General: Skin is warm and dry.     Capillary Refill: Capillary refill takes less than 2 seconds.  Neurological:     General: No focal deficit present.     Mental Status: He is alert and oriented to person, place, and time.     Deep Tendon Reflexes: Reflexes normal.  Psychiatric:        Mood and Affect: Mood normal.     Procedures  .Critical Care  Performed by: Nettie Earing, MD Authorized by: Nettie Earing, MD   Critical care provider statement:    Critical care time (minutes):  30   Critical care end time:  06/08/2024 1:01 AM   Critical care was necessary to treat or prevent imminent or life-threatening deterioration of the following conditions:  Cardiac failure   Critical care was time spent personally by me on the following activities:  Development of treatment plan with patient or surrogate, discussions with consultants, evaluation of  patient's response to treatment, examination of patient, ordering and review of laboratory studies, ordering and review of radiographic studies, ordering and performing treatments and interventions, pulse oximetry, re-evaluation of patient's condition and review of old charts Comments:     Case d/w Dr. Gail of cardiology admit to medicine   Heparin  initiated    ED Course / MDM    Medical Decision Making Amount and/or Complexity of Data Reviewed Labs: ordered. Radiology: ordered.  Risk Prescription drug management.          Laray Corbit, MD 06/08/24 [phone]
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
## 2024-06-08 NOTE — ED Notes (Signed)
 CCMD called.

## 2024-06-08 NOTE — Progress Notes (Signed)
 Echocardiogram Attempted exam at 1230pm - patient not in room. Will try again later.  Luis Poole 06/08/2024, 12:33 PM

## 2024-06-08 NOTE — Progress Notes (Signed)
 PHARMACY - ANTICOAGULATION CONSULT NOTE  Pharmacy Consult for Heparin  Indication: chest pain/ACS  Allergies  Allergen Reactions   Ibuprofen  Itching   Lisinopril Other (See Comments)    Swollen lips   Pregabalin  Swelling    Recently filled 11/24 for 90ds and reports taking on 08/11/23   Gabapentin  Other (See Comments)    Dizziness    Patient Measurements: Height: 5' 7 (170.2 cm) Weight: 88.5 kg (195 lb) IBW/kg (Calculated) : 66.1 HEPARIN  DW (KG): 84.4  Vital Signs: BP: 139/79 (10/20 2245) Pulse Rate: 71 (10/20 2245)  Labs: Recent Labs    06/07/24 2022 06/07/24 2244  HGB 11.7*  --   HCT 36.4*  --   PLT 202  --   CREATININE 1.24  --   TROPONINIHS 20* 116*    Estimated Creatinine Clearance: 49.6 mL/min (by C-G formula based on SCr of 1.24 mg/dL).   Medical History: Past Medical History:  Diagnosis Date   Anemia, unspecified 06/08/2013   Cataract    CHF (congestive heart failure) (HCC)    Chronic kidney disease    Diabetes mellitus    Foot drop, left foot    AFO   Hypertension    PAD (peripheral artery disease)    S/P lumbar fusion    Sequelae of cerebral infarction    Stroke [facility])     Medications:  No current facility-administered medications on file prior to encounter.   Current Outpatient Medications on File Prior to Encounter  Medication Sig Dispense Refill   allopurinol  (ZYLOPRIM ) 100 MG tablet Take 100 mg by mouth daily.     amLODipine  (NORVASC ) 10 MG tablet Take 0.5 tablets (5 mg total) by mouth daily.     aspirin  EC 81 MG tablet Take 81 mg by mouth daily.     atorvastatin  (LIPITOR) 20 MG tablet Take 1 tablet (20 mg total) by mouth daily. (Patient taking differently: Take 20 mg by mouth daily as needed (High Cholesterol).) 30 tablet 0   colchicine  0.6 MG tablet Take 1 tablet (0.6 mg total) daily by mouth. (Patient taking differently: Take 0.6 mg by mouth daily as needed (gout pain).) 30 tablet 0   cyclobenzaprine  (FLEXERIL ) 5 MG tablet Take 1  tablet (5 mg total) by mouth 2 (two) times daily as needed for muscle spasms. 20 tablet 0   ferrous sulfate  325 (65 FE) MG tablet Take 325 mg by mouth daily with breakfast.     lidocaine  (LIDODERM ) 5 % Place 1 patch onto the skin daily. Remove & Discard patch within 12 hours or as directed by MD 30 patch 0   meclizine  (ANTIVERT ) 12.5 MG tablet Take 1 tablet (12.5 mg total) by mouth 3 (three) times daily as needed for dizziness. 30 tablet 0   meloxicam (MOBIC) 7.5 MG tablet Take 1 tablet (7.5 mg total) by mouth daily. 30 tablet 0   methylPREDNISolone  (MEDROL  DOSEPAK) 4 MG TBPK tablet Follow package insert 21 each 0   omeprazole  (PRILOSEC) 40 MG capsule Take 1 capsule (40 mg total) by mouth daily. 30 capsule 0   oxyCODONE -acetaminophen  (PERCOCET/ROXICET) 5-325 MG tablet Take 1 tablet by mouth every 8 (eight) hours as needed for severe pain (pain score 7-10). 5 tablet 0   oxyCODONE -acetaminophen  (PERCOCET/ROXICET) 5-325 MG tablet Take 1 tablet by mouth every 8 (eight) hours as needed for up to 10 doses for severe pain (pain score 7-10). 10 tablet 0   polyethylene glycol (MIRALAX  / GLYCOLAX ) 17 g packet Take 17 g by mouth daily as needed for  mild constipation. 14 each 0   valsartan  (DIOVAN ) 160 MG tablet Take 1 tablet (160 mg total) by mouth daily. 90 tablet 3     Assessment: 83 y.o. male with chest pain for heparin   Goal of Therapy:  Heparin  level 0.3-0.7 units/ml Monitor platelets by anticoagulation protocol: Yes   Plan:  Heparin  4000 units IV bolus, then start heparin  1100 units/hr Check heparin  level in 8 hours.   Dail Cordella Misty 06/08/2024,12:24 AM

## 2024-06-08 NOTE — Progress Notes (Signed)
 Echocardiogram 2D Echocardiogram has been performed.  Juliene JINNY Rucks 06/08/2024, 3:21 PM
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
## 2024-06-08 NOTE — Progress Notes (Addendum)
 PHARMACY - ANTICOAGULATION CONSULT NOTE  Pharmacy Consult for Heparin  Indication: chest pain/ACS  Allergies  Allergen Reactions   Ibuprofen  Itching   Lisinopril Other (See Comments)    Swollen lips   Pregabalin  Swelling    Recently filled 11/24 for 90ds and reports taking on 08/11/23   Gabapentin  Other (See Comments)    Dizziness    Patient Measurements: Height: 5' 7 (170.2 cm) Weight: 88.5 kg (195 lb) IBW/kg (Calculated) : 66.1 HEPARIN  DW (KG): 84.4  Vital Signs: Temp: 98.3 F (36.8 C) (10/21 0909) Temp Source: Oral (10/21 0909) BP: 142/96 (10/21 0900) Pulse Rate: 55 (10/21 0900)  Labs: Recent Labs    06/07/24 2022 06/07/24 2244 06/08/24 0618 06/08/24 0909  HGB 11.7*  --   --   --   HCT 36.4*  --   --   --   PLT 202  --   --   --   HEPARINUNFRC  --   --   --  0.44  CREATININE 1.24  --  1.06  --   TROPONINIHS 20* 116*  --   --     Estimated Creatinine Clearance: 58.1 mL/min (by C-G formula based on SCr of 1.06 mg/dL).   Medical History: Past Medical History:  Diagnosis Date   Anemia, unspecified 06/08/2013   Cataract    CHF (congestive heart failure) (HCC)    Chronic kidney disease    Diabetes mellitus    Foot drop, left foot    AFO   Hypertension    PAD (peripheral artery disease)    S/P lumbar fusion    Sequelae of cerebral infarction    Stroke [facility])     Medications:  No current facility-administered medications on file prior to encounter.   Current Outpatient Medications on File Prior to Encounter  Medication Sig Dispense Refill   allopurinol  (ZYLOPRIM ) 100 MG tablet Take 100 mg by mouth daily as needed (for gout flareups).     amLODipine  (NORVASC ) 10 MG tablet Take 0.5 tablets (5 mg total) by mouth daily.     aspirin  EC 81 MG tablet Take 81 mg by mouth daily.     atorvastatin  (LIPITOR) 20 MG tablet Take 1 tablet (20 mg total) by mouth daily. (Patient taking differently: Take 20 mg by mouth at bedtime.) 30 tablet 0   colchicine  0.6 MG  tablet Take 1 tablet (0.6 mg total) daily by mouth. (Patient taking differently: Take 0.6 mg by mouth daily as needed (gout pain).) 30 tablet 0   cyclobenzaprine  (FLEXERIL ) 5 MG tablet Take 1 tablet (5 mg total) by mouth 2 (two) times daily as needed for muscle spasms. 20 tablet 0   ferrous sulfate  325 (65 FE) MG tablet Take 325 mg by mouth daily with breakfast.     lidocaine  (LIDODERM ) 5 % Place 1 patch onto the skin daily. Remove & Discard patch within 12 hours or as directed by MD 30 patch 0   meclizine  (ANTIVERT ) 12.5 MG tablet Take 1 tablet (12.5 mg total) by mouth 3 (three) times daily as needed for dizziness. 30 tablet 0   meloxicam (MOBIC) 7.5 MG tablet Take 1 tablet (7.5 mg total) by mouth daily. 30 tablet 0   oxyCODONE -acetaminophen  (PERCOCET/ROXICET) 5-325 MG tablet Take 1 tablet by mouth every 8 (eight) hours as needed for up to 10 doses for severe pain (pain score 7-10). 10 tablet 0   polyethylene glycol (MIRALAX  / GLYCOLAX ) 17 g packet Take 17 g by mouth daily as needed for mild constipation. 14  each 0   potassium chloride  (KLOR-CON  M) 10 MEQ tablet Take 10 mEq by mouth daily.     valsartan  (DIOVAN ) 320 MG tablet Take 320 mg by mouth daily.     omeprazole  (PRILOSEC) 40 MG capsule Take 1 capsule (40 mg total) by mouth daily. 30 capsule 0   [DISCONTINUED] methylPREDNISolone  (MEDROL  DOSEPAK) 4 MG TBPK tablet Follow package insert 21 each 0   [DISCONTINUED] oxyCODONE -acetaminophen  (PERCOCET/ROXICET) 5-325 MG tablet Take 1 tablet by mouth every 8 (eight) hours as needed for severe pain (pain score 7-10). 5 tablet 0   [DISCONTINUED] valsartan  (DIOVAN ) 160 MG tablet Take 1 tablet (160 mg total) by mouth daily. 90 tablet 3     Assessment: 83 y.o. male with chest pain (Type INSTEMI) with PMH of CHF, HTN, CKD, PAD, DM, and stroke. Patient reports that he began having episodes of chest pain 2 or 3 weeks ago, and EKG demonstrates sinus rhythm with first-degree AV nodal block. Pt has evidence of  acute myocardial injury and currently NPO. He is currently chest pain free but has a high risk for obstructive coronary artery disease. Given his risk factors such as advanced age, Type II DM and HTN, coronary angiography would be performed for further evaluation.   Current labs: Troponin I (10/20): 116 (elevated) Estimated GFR: >60 mL/min Heparin  level (10/21): 0.44  RBC: 4.12 (low) Hgb: 11.7 (low) HCT: 35.4 (low)  Goal of Therapy:  Heparin  level 0.3-0.7 units/ml Monitor platelets by anticoagulation protocol: Yes   Plan:  Currently on heparin  1100 units/hr with hepatin level: 0.44 within therapeutic range. No change on heparin  dosing.  Check heparin  level in 8 hours or depending on cath lab.  Donda Friedli 06/08/2024,10:25 AM

## 2024-06-08 NOTE — ED Notes (Signed)
 Floor notified patient coming up

## 2024-06-09 ENCOUNTER — Encounter (HOSPITAL_COMMUNITY): Payer: Self-pay | Admitting: Cardiology

## 2024-06-09 ENCOUNTER — Observation Stay (HOSPITAL_COMMUNITY)

## 2024-06-09 DIAGNOSIS — R7989 Other specified abnormal findings of blood chemistry: Secondary | ICD-10-CM | POA: Diagnosis not present

## 2024-06-09 DIAGNOSIS — I440 Atrioventricular block, first degree: Secondary | ICD-10-CM | POA: Diagnosis present

## 2024-06-09 DIAGNOSIS — Z79899 Other long term (current) drug therapy: Secondary | ICD-10-CM | POA: Diagnosis not present

## 2024-06-09 DIAGNOSIS — I455 Other specified heart block: Secondary | ICD-10-CM | POA: Diagnosis present

## 2024-06-09 DIAGNOSIS — Z01810 Encounter for preprocedural cardiovascular examination: Secondary | ICD-10-CM

## 2024-06-09 DIAGNOSIS — Z7409 Other reduced mobility: Secondary | ICD-10-CM | POA: Diagnosis present

## 2024-06-09 DIAGNOSIS — N182 Chronic kidney disease, stage 2 (mild): Secondary | ICD-10-CM | POA: Diagnosis present

## 2024-06-09 DIAGNOSIS — Z7902 Long term (current) use of antithrombotics/antiplatelets: Secondary | ICD-10-CM | POA: Diagnosis not present

## 2024-06-09 DIAGNOSIS — R42 Dizziness and giddiness: Secondary | ICD-10-CM | POA: Diagnosis present

## 2024-06-09 DIAGNOSIS — I214 Non-ST elevation (NSTEMI) myocardial infarction: Secondary | ICD-10-CM | POA: Diagnosis not present

## 2024-06-09 DIAGNOSIS — M7989 Other specified soft tissue disorders: Secondary | ICD-10-CM | POA: Diagnosis not present

## 2024-06-09 DIAGNOSIS — Z993 Dependence on wheelchair: Secondary | ICD-10-CM | POA: Diagnosis not present

## 2024-06-09 DIAGNOSIS — E7841 Elevated Lipoprotein(a): Secondary | ICD-10-CM | POA: Diagnosis present

## 2024-06-09 DIAGNOSIS — G8929 Other chronic pain: Secondary | ICD-10-CM | POA: Diagnosis present

## 2024-06-09 DIAGNOSIS — E1122 Type 2 diabetes mellitus with diabetic chronic kidney disease: Secondary | ICD-10-CM | POA: Diagnosis present

## 2024-06-09 DIAGNOSIS — Z87891 Personal history of nicotine dependence: Secondary | ICD-10-CM | POA: Diagnosis not present

## 2024-06-09 DIAGNOSIS — I5030 Unspecified diastolic (congestive) heart failure: Secondary | ICD-10-CM

## 2024-06-09 DIAGNOSIS — I130 Hypertensive heart and chronic kidney disease with heart failure and stage 1 through stage 4 chronic kidney disease, or unspecified chronic kidney disease: Secondary | ICD-10-CM | POA: Diagnosis not present

## 2024-06-09 DIAGNOSIS — E876 Hypokalemia: Secondary | ICD-10-CM | POA: Diagnosis present

## 2024-06-09 DIAGNOSIS — R079 Chest pain, unspecified: Secondary | ICD-10-CM | POA: Diagnosis not present

## 2024-06-09 DIAGNOSIS — I4510 Unspecified right bundle-branch block: Secondary | ICD-10-CM | POA: Diagnosis present

## 2024-06-09 DIAGNOSIS — I10 Essential (primary) hypertension: Secondary | ICD-10-CM | POA: Diagnosis not present

## 2024-06-09 DIAGNOSIS — Z8673 Personal history of transient ischemic attack (TIA), and cerebral infarction without residual deficits: Secondary | ICD-10-CM | POA: Diagnosis not present

## 2024-06-09 DIAGNOSIS — E1151 Type 2 diabetes mellitus with diabetic peripheral angiopathy without gangrene: Secondary | ICD-10-CM | POA: Diagnosis not present

## 2024-06-09 DIAGNOSIS — M109 Gout, unspecified: Secondary | ICD-10-CM | POA: Diagnosis present

## 2024-06-09 DIAGNOSIS — I2510 Atherosclerotic heart disease of native coronary artery without angina pectoris: Secondary | ICD-10-CM | POA: Diagnosis present

## 2024-06-09 DIAGNOSIS — I739 Peripheral vascular disease, unspecified: Secondary | ICD-10-CM

## 2024-06-09 DIAGNOSIS — N189 Chronic kidney disease, unspecified: Secondary | ICD-10-CM

## 2024-06-09 DIAGNOSIS — I25110 Atherosclerotic heart disease of native coronary artery with unstable angina pectoris: Secondary | ICD-10-CM | POA: Diagnosis not present

## 2024-06-09 DIAGNOSIS — Z9181 History of falling: Secondary | ICD-10-CM | POA: Diagnosis not present

## 2024-06-09 DIAGNOSIS — E1159 Type 2 diabetes mellitus with other circulatory complications: Secondary | ICD-10-CM | POA: Diagnosis not present

## 2024-06-09 DIAGNOSIS — K219 Gastro-esophageal reflux disease without esophagitis: Secondary | ICD-10-CM | POA: Diagnosis present

## 2024-06-09 DIAGNOSIS — I252 Old myocardial infarction: Secondary | ICD-10-CM | POA: Diagnosis not present

## 2024-06-09 DIAGNOSIS — Q250 Patent ductus arteriosus: Secondary | ICD-10-CM | POA: Diagnosis not present

## 2024-06-09 DIAGNOSIS — Z8249 Family history of ischemic heart disease and other diseases of the circulatory system: Secondary | ICD-10-CM | POA: Diagnosis not present

## 2024-06-09 DIAGNOSIS — I5032 Chronic diastolic (congestive) heart failure: Secondary | ICD-10-CM | POA: Diagnosis present

## 2024-06-09 LAB — BASIC METABOLIC PANEL WITH GFR
Anion gap: 11 (ref 5–15)
BUN: 11 mg/dL (ref 8–23)
CO2: 23 mmol/L (ref 22–32)
Calcium: 8.7 mg/dL — ABNORMAL LOW (ref 8.9–10.3)
Chloride: 102 mmol/L (ref 98–111)
Creatinine, Ser: 1.19 mg/dL (ref 0.61–1.24)
GFR, Estimated: >60 mL/min (ref >60)
Glucose, Bld: 126 mg/dL — ABNORMAL HIGH (ref 70–99)
Potassium: 4 mmol/L (ref 3.5–5.1)
Sodium: 136 mmol/L (ref 135–145)

## 2024-06-09 LAB — CBC
HCT: 33.3 % — ABNORMAL LOW (ref 39.0–52.0)
Hemoglobin: 10.9 g/dL — ABNORMAL LOW (ref 13.0–17.0)
MCH: 28.5 pg (ref 26.0–34.0)
MCHC: 32.7 g/dL (ref 30.0–36.0)
MCV: 87.2 fL (ref 80.0–100.0)
Platelets: 194 K/uL (ref 150–400)
RBC: 3.82 MIL/uL — ABNORMAL LOW (ref 4.22–5.81)
RDW: 14.2 % (ref 11.5–15.5)
WBC: 5.2 K/uL (ref 4.0–10.5)
nRBC: 0 % (ref 0.0–0.2)

## 2024-06-09 LAB — HEPARIN LEVEL (UNFRACTIONATED): Heparin Unfractionated: 0.52 [IU]/mL (ref 0.30–0.70)

## 2024-06-09 MED ORDER — COLCHICINE 0.6 MG PO TABS
0.6000 mg | ORAL_TABLET | Freq: Every day | ORAL | Status: DC | PRN
  Administered 2024-06-11 – 2024-06-12 (×2): 0.6 mg via ORAL
  Filled 2024-06-09 (×2): qty 1

## 2024-06-09 MED ORDER — ALLOPURINOL 100 MG PO TABS
100.0000 mg | ORAL_TABLET | Freq: Every day | ORAL | Status: DC | PRN
  Administered 2024-06-09 – 2024-06-10 (×2): 100 mg via ORAL
  Filled 2024-06-09 (×2): qty 1

## 2024-06-09 MED ORDER — ATORVASTATIN CALCIUM 80 MG PO TABS
80.0000 mg | ORAL_TABLET | Freq: Every day | ORAL | Status: DC
  Administered 2024-06-10 – 2024-06-18 (×9): 80 mg via ORAL
  Filled 2024-06-09 (×9): qty 1

## 2024-06-09 NOTE — Progress Notes (Signed)
 Rounding Note   Patient Name: Luis Poole Date of Encounter: 06/09/2024  [facility] Cardiologist: Maude Emmer, MD   Subjective - Underwent LHC yesterday demonstrating severe multivessel disease - Patient says that he feels well today without chest pain or SOB  Scheduled Meds:  amLODipine   5 mg Oral Daily   aspirin  EC  81 mg Oral Daily   atorvastatin   40 mg Oral Daily   irbesartan   300 mg Oral Daily   lidocaine   1 patch Transdermal Q24H   pantoprazole   40 mg Oral Daily   sodium chloride  flush  3 mL Intravenous Q12H   Continuous Infusions:  sodium chloride      heparin  1,100 Units/hr (06/08/24 2123)   PRN Meds: sodium chloride , acetaminophen , allopurinol , colchicine , cyclobenzaprine , nitroGLYCERIN, ondansetron  (ZOFRAN ) IV, oxyCODONE , polyethylene glycol, sodium chloride  flush   Vital Signs  Vitals:   06/08/24 1715 06/08/24 1940 06/08/24 2330 06/09/24 0400  BP: (!) 150/70 (!) 159/86 124/88 125/87  Pulse: (!) 50 (!) 52  63  Resp: (!) 21 18 17 20   Temp:  (!) 97.5 F (36.4 C) 97.7 F (36.5 C) 98.8 F (37.1 C)  TempSrc:  Oral Oral Oral  SpO2: 100% 100% 99% 100%  Weight:    85.3 kg  Height:    5' 7 (1.702 m)    Intake/Output Summary (Last 24 hours) at 06/09/2024 0909 Last data filed at 06/09/2024 0844 Gross per 24 hour  Intake 1517.98 ml  Output 2660 ml  Net -1142.02 ml      06/09/2024    4:00 AM 06/07/2024    8:15 PM 06/04/2024    5:53 PM  Last 3 Weights  Weight (lbs) 188 lb 0.8 oz 195 lb 200 lb  Weight (kg) 85.3 kg 88.451 kg 90.719 kg      Telemetry NSR- Personally Reviewed  ECG  NSR with RBBB- Personally Reviewed  Physical Exam  GEN: No acute distress.   Neck: No JVD Cardiac: RRR, no murmurs, rubs, or gallops.  Respiratory: Clear to auscultation bilaterally. GI: Soft, nontender, non-distended  MS: No edema; No deformity. Neuro:  Nonfocal  Psych: Normal affect   Labs High Sensitivity Troponin:   Recent Labs  Lab  05/22/24 2248 05/23/24 0045 06/07/24 2022 06/07/24 2244  TROPONINIHS 7 7 20* 116*     Chemistry Recent Labs  Lab 06/07/24 2022 06/08/24 0618 06/09/24 0627  NA 138 137 136  K 3.5 3.2* 4.0  CL 105 107 102  CO2 21* 22 23  GLUCOSE 110* 105* 126*  BUN 16 13 11   CREATININE 1.24 1.06 1.19  CALCIUM  9.1 8.5* 8.7*  GFRNONAA 58* >60 >60  ANIONGAP 12 8 11     Lipids  Recent Labs  Lab 06/08/24 0620  CHOL 159  TRIG 28  HDL 40*  LDLCALC 113*  CHOLHDL 4.0    Hematology Recent Labs  Lab 06/07/24 2022 06/09/24 0627  WBC 4.4 5.2  RBC 4.12* 3.82*  HGB 11.7* 10.9*  HCT 36.4* 33.3*  MCV 88.3 87.2  MCH 28.4 28.5  MCHC 32.1 32.7  RDW 14.0 14.2  PLT 202 194   Thyroid  No results for input(s): TSH, FREET4 in the last 168 hours.  BNPNo results for input(s): BNP, PROBNP in the last 168 hours.  DDimer No results for input(s): DDIMER in the last 168 hours.   Radiology  CARDIAC CATHETERIZATION Result Date: 06/08/2024 Images from the original result were not included.   Prox RCA lesion is 40% stenosed. Cardiac Catheterization 06/08/24: Hemodynamic data: LVEDP  9 mmHg.  There is no pressure gradient across the aortic valve. Angiographic data: LM: Logical vessel, smooth and normal. LAD: Is a large vessel, gives origin to a very large D1 with secondary branch.  D1 has a proximal focal 90% stenosis.  Mid LAD has a focal 90% stenosis.  Rest of the LAD has mild disease. LCx: Gives origin to moderate-sized OM1 with ostial 95% stenosis followed by subtotally occluded mid CX and gives origin to a very small OM 2. RCA: Dominant vessel.  Proximal 30-40% eccentric calcific stenosis. Small PDA with severe diffuse disease.  Moderate-sized PL branch without significant disease. Impression and recommendations: Patient has 3 focal lesions in a moderate-sized OM1, large D1 and mid LAD.  Patient may benefit from CT surgical evaluation to see if would be a good candidate for surgery.  Restart IV heparin   in view of ACS 4 to 6 hours after sheath pull. Could consider multivessel PCI as an option as well if not felt to be a surgical candidate.   ECHOCARDIOGRAM LIMITED Result Date: 06/08/2024    ECHOCARDIOGRAM LIMITED REPORT   Patient Name:   Luis Poole Date of Exam: 06/08/2024 Medical Rec #:  [id number]     Height:       67.0 in Accession #:    [id number]    Weight:       195.0 lb Date of Birth:  Dec 20, 1940      BSA:          2.001 m Patient Age:    83 years      BP:           159/75 mmHg Patient Gender: M             HR:           60 bpm. Exam Location:  Inpatient Procedure: Limited Echo, Cardiac Doppler and Color Doppler (Both Spectral and            Color Flow Doppler were utilized during procedure). Indications:    NStemi  History:        Patient has prior history of Echocardiogram examinations, most                 recent 12/25/2023.  Sonographer:    VALENTE, ADAM Referring Phys: Noriah Osgood IMPRESSIONS  1. Suboptimal evaluation of the left ventricular wall motion. Cannot exclude basal inferior and inferolateral hypokinesis, but otherwise appears to have normal wall motion. Left ventricular ejection fraction, by estimation, is 65 to 70%. The left ventricle has normal function. Left ventricular endocardial border not optimally defined to evaluate regional wall motion. Left ventricular diastolic function could not be evaluated.  2. Right ventricular systolic function is normal. The right ventricular size is normal.  3. The aortic valve is tricuspid. There is mild calcification of the aortic valve. Aortic valve regurgitation is not visualized. Aortic valve sclerosis/calcification is present, without any evidence of aortic stenosis. FINDINGS  Left Ventricle: Suboptimal evaluation of the left ventricular wall motion. Cannot exclude basal inferior and inferolateral hypokinesis, but otherwise appears to have normal wall motion. Left ventricular ejection fraction, by estimation, is 65 to 70%. The left ventricle has  normal function. Left ventricular endocardial border not optimally defined to evaluate regional wall motion. The left ventricular internal cavity size was normal in size. There is no left ventricular hypertrophy. Left ventricular diastolic function could not be evaluated. Right Ventricle: The right ventricular size is normal. Right vetricular wall thickness was not well visualized. Right  ventricular systolic function is normal. Right Atrium: Prominent Eustachian valve. Pericardium: Trivial pericardial effusion is present. Tricuspid Valve: The tricuspid valve is grossly normal. Tricuspid valve regurgitation is trivial. Aortic Valve: The aortic valve is tricuspid. There is mild calcification of the aortic valve. Aortic valve regurgitation is not visualized. Aortic valve sclerosis/calcification is present, without any evidence of aortic stenosis. Aortic valve mean gradient measures 4.0 mmHg. Aortic valve peak gradient measures 6.9 mmHg. Aortic valve area, by VTI measures 2.74 cm. Pulmonic Valve: The pulmonic valve was not well visualized. Pulmonic valve regurgitation is not visualized. Venous: The inferior vena cava was not well visualized. LEFT VENTRICLE PLAX 2D LVIDd:         4.40 cm LVIDs:         3.20 cm LV PW:         1.10 cm LV IVS:        1.10 cm LVOT diam:     2.10 cm LV SV:         86 LV SV Index:   43 LVOT Area:     3.46 cm  LEFT ATRIUM         Index LA diam:    3.80 cm 1.90 cm/m  AORTIC VALVE AV Area (Vmax):    2.86 cm AV Area (Vmean):   2.87 cm AV Area (VTI):     2.74 cm AV Vmax:           131.00 cm/s AV Vmean:          85.300 cm/s AV VTI:            0.314 m AV Peak Grad:      6.9 mmHg AV Mean Grad:      4.0 mmHg LVOT Vmax:         108.00 cm/s LVOT Vmean:        70.700 cm/s LVOT VTI:          0.248 m LVOT/AV VTI ratio: 0.79  AORTA Ao Root diam: 3.00 cm TRICUSPID VALVE TR Peak grad:   16.0 mmHg TR Vmax:        200.00 cm/s  SHUNTS Systemic VTI:  0.25 m Systemic Diam: 2.10 cm Jerel Croitoru MD  Electronically signed by Jerel Balding MD Signature Date/Time: 06/08/2024/4:02:50 PM    Final    DG Chest 2 View Result Date: 06/07/2024 CLINICAL DATA:  Chest pain EXAM: CHEST - 2 VIEW COMPARISON:  05/22/2024 FINDINGS: The heart size and mediastinal contours are within normal limits. Both lungs are clear. The visualized skeletal structures show changes of prior fusion in the cervical spine. Spinal stimulator is noted as well. IMPRESSION: No active cardiopulmonary disease. Electronically Signed   By: Oneil Devonshire M.D.   On: 06/07/2024 21:09    Cardiac Studies   TTE 06/08/24:  IMPRESSIONS     1. Suboptimal evaluation of the left ventricular wall motion. Cannot  exclude basal inferior and inferolateral hypokinesis, but otherwise  appears to have normal wall motion. Left ventricular ejection fraction, by  estimation, is 65 to 70%. The left  ventricle has normal function. Left ventricular endocardial border not  optimally defined to evaluate regional wall motion. Left ventricular  diastolic function could not be evaluated.   2. Right ventricular systolic function is normal. The right ventricular  size is normal.   3. The aortic valve is tricuspid. There is mild calcification of the  aortic valve. Aortic valve regurgitation is not visualized. Aortic valve  sclerosis/calcification is present, without any evidence  of aortic  stenosis.    LHC 06/08/24:  Angiographic data: LM: Logical vessel, smooth and normal. LAD: Is a large vessel, gives origin to a very large D1 with secondary branch.  D1 has a proximal focal 90% stenosis.  Mid LAD has a focal 90% stenosis.  Rest of the LAD has mild disease. LCx: Gives origin to moderate-sized OM1 with ostial 95% stenosis followed by subtotally occluded mid CX and gives origin to a very small OM 2. RCA: Dominant vessel.  Proximal 30-40% eccentric calcific stenosis. Small PDA with severe diffuse disease.  Moderate-sized PL branch without significant  disease.  Patient Profile   Luis Poole is a 83 y.o. male with a hx of hypertension, type II diabetes, hyperlipidemia and chronic back pain who is being seen 06/08/2024 for the evaluation of elevated chest at the request of the Emergency Department.   Assessment & Plan    #Type 1 NSTEMI #Severe Multivessel CAD #HLD ::Patient presented with chest pain and found to have elevated troponins.  LHC performed on 10/21 demonstrated severe multivessel CAD. We will get in contact with CT surgery for surgical evaluation.  I do worry about his surgical candidacy given his mobility issues, but I will defer that assessment to the surgeons.  Will continue heparin  for now. - Continue heparin  GTT - Continue baby aspirin  daily - Continue atorvastatin  40 mg daily - Continue irbesartan  300 mg daily - Follow-up CT surgery recommendations  #HTN - BP is adequately controlled. - Continue irbesartan       For questions or updates, please contact [facility] Please consult [email or web address] for contact info under       Signed, Georganna Archer, MD  06/09/2024, 9:09 AM
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
## 2024-06-09 NOTE — Care Management Obs Status (Signed)
 MEDICARE OBSERVATION STATUS NOTIFICATION   Patient Details  Name: Luis Poole MRN: [id number] Date of Birth: [birth year]   Medicare Observation Status Notification Given:  Yes    [name] 06/09/2024, 9:03 AM
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
## 2024-06-09 NOTE — Plan of Care (Signed)
  Problem: Cardiac: Goal: Ability to achieve and maintain adequate cardiovascular perfusion will improve Outcome: Progressing   Problem: Clinical Measurements: Goal: Respiratory complications will improve Outcome: Progressing Goal: Cardiovascular complication will be avoided Outcome: Progressing   Problem: Elimination: Goal: Will not experience complications related to urinary retention Outcome: Progressing   Problem: Skin Integrity: Goal: Risk for impaired skin integrity will decrease Outcome: Progressing   Problem: Cardiovascular: Goal: Vascular access site(s) Level 0-1 will be maintained Outcome: Progressing

## 2024-06-09 NOTE — Plan of Care (Signed)
  Problem: Health Behavior/Discharge Planning: Goal: Ability to safely manage health-related needs after discharge will improve Outcome: Progressing   Problem: Education: Goal: Knowledge of General Education information will improve Description: Including pain rating scale, medication(s)/side effects and non-pharmacologic comfort measures Outcome: Progressing   Problem: Activity: Goal: Ability to tolerate increased activity will improve Outcome: Progressing

## 2024-06-09 NOTE — Progress Notes (Signed)
 Pre-CABG has been completed.   Results can be found under chart review under CV PROC. 06/09/2024 4:53 PM Lindwood Mogel RVT, RDMS

## 2024-06-09 NOTE — TOC CM/SW Note (Signed)
 Transition of Care [facility]) - Inpatient Brief Assessment   Patient Details  Name: Luis Poole MRN: [id number] Date of Birth: [birth year]  [facility] CM/SW Contact:    [name], LCSWA Phone Number: 06/09/2024, 8:37 AM   Clinical Narrative: Patient is from home with son. Patient has reliable transportation to and from medical appointments. Patient has a PCP and uses Development worker, community. Patient has previous SNF hx at [facility] and [facility] hx with [facility]. No DME hx at this time.   ICM (inpatient care management) will continue to monitor/follow through daily progression meetings.    Transition of Care Asessment: Insurance and Status: Insurance coverage has been reviewed Patient has primary care physician: Yes Home environment has been reviewed: Home with son Prior level of function:: Indpendent Prior/Current Home Services: Current home services Social Drivers of Health Review: SDOH reviewed no interventions necessary Readmission risk has been reviewed: Yes Transition of care needs: no transition of care needs at this time

## 2024-06-09 NOTE — Progress Notes (Signed)
 PHARMACY - ANTICOAGULATION CONSULT NOTE  Pharmacy Consult for Heparin  Indication: chest pain/ACS  Allergies  Allergen Reactions   Ibuprofen  Itching   Lisinopril Other (See Comments)    Swollen lips   Pregabalin  Swelling    Recently filled 11/24 for 90ds and reports taking on 08/11/23   Gabapentin  Other (See Comments)    Dizziness    Patient Measurements: Height: 5' 7 (170.2 cm) Weight: 85.3 kg (188 lb 0.8 oz) IBW/kg (Calculated) : 66.1 HEPARIN  DW (KG): 83.4  Vital Signs: Temp: 98.8 F (37.1 C) (10/22 0400) Temp Source: Oral (10/22 0400) BP: 125/87 (10/22 0400) Pulse Rate: 63 (10/22 0400)  Labs: Recent Labs    06/07/24 2022 06/07/24 2244 06/08/24 0618 06/08/24 0909 06/09/24 0627  HGB 11.7*  --   --   --  10.9*  HCT 36.4*  --   --   --  33.3*  PLT 202  --   --   --  194  HEPARINUNFRC  --   --   --  0.44 0.52  CREATININE 1.24  --  1.06  --  1.19  TROPONINIHS 20* 116*  --   --   --     Estimated Creatinine Clearance: 50.8 mL/min (by C-G formula based on SCr of 1.19 mg/dL).   Medical History: Past Medical History:  Diagnosis Date   Anemia, unspecified 06/08/2013   Cataract    CHF (congestive heart failure) (HCC)    Chronic kidney disease    Diabetes mellitus    Foot drop, left foot    AFO   Hypertension    PAD (peripheral artery disease)    S/P lumbar fusion    Sequelae of cerebral infarction    Stroke [facility])     Medications:  No current facility-administered medications on file prior to encounter.   Current Outpatient Medications on File Prior to Encounter  Medication Sig Dispense Refill   allopurinol  (ZYLOPRIM ) 100 MG tablet Take 100 mg by mouth daily as needed (for gout flareups).     amLODipine  (NORVASC ) 10 MG tablet Take 0.5 tablets (5 mg total) by mouth daily.     aspirin  EC 81 MG tablet Take 81 mg by mouth daily.     atorvastatin  (LIPITOR) 20 MG tablet Take 1 tablet (20 mg total) by mouth daily. (Patient taking differently: Take 20 mg by  mouth at bedtime.) 30 tablet 0   colchicine  0.6 MG tablet Take 1 tablet (0.6 mg total) daily by mouth. (Patient taking differently: Take 0.6 mg by mouth daily as needed (gout pain).) 30 tablet 0   cyclobenzaprine  (FLEXERIL ) 5 MG tablet Take 1 tablet (5 mg total) by mouth 2 (two) times daily as needed for muscle spasms. 20 tablet 0   ferrous sulfate  325 (65 FE) MG tablet Take 325 mg by mouth daily with breakfast.     lidocaine  (LIDODERM ) 5 % Place 1 patch onto the skin daily. Remove & Discard patch within 12 hours or as directed by MD 30 patch 0   meclizine  (ANTIVERT ) 12.5 MG tablet Take 1 tablet (12.5 mg total) by mouth 3 (three) times daily as needed for dizziness. 30 tablet 0   meloxicam (MOBIC) 7.5 MG tablet Take 1 tablet (7.5 mg total) by mouth daily. 30 tablet 0   oxyCODONE -acetaminophen  (PERCOCET/ROXICET) 5-325 MG tablet Take 1 tablet by mouth every 8 (eight) hours as needed for up to 10 doses for severe pain (pain score 7-10). 10 tablet 0   polyethylene glycol (MIRALAX  / GLYCOLAX ) 17 g packet  Take 17 g by mouth daily as needed for mild constipation. 14 each 0   potassium chloride  (KLOR-CON  M) 10 MEQ tablet Take 10 mEq by mouth daily.     valsartan  (DIOVAN ) 320 MG tablet Take 320 mg by mouth daily.     omeprazole  (PRILOSEC) 40 MG capsule Take 1 capsule (40 mg total) by mouth daily. 30 capsule 0     Assessment: 83 y.o. male with chest pain with PMH of CHF, HTN, CKD, PAD, DM, and stroke.   He is now s/p cath with multivessel CAD for CABG evaluation -heparin  level= 0.53 and at goal on 1100 units/hr   Goal of Therapy:  Heparin  level 0.3-0.7 units/ml Monitor platelets by anticoagulation protocol: Yes   Plan:  -Continue heparin  1100 units/hr -Daily heparin  level and CBC  Prentice Poisson, PharmD Clinical Pharmacist **Pharmacist phone directory can now be found on [email or web address] ([email or web address]).  Listed under [facility].

## 2024-06-10 DIAGNOSIS — I214 Non-ST elevation (NSTEMI) myocardial infarction: Secondary | ICD-10-CM

## 2024-06-10 DIAGNOSIS — G8929 Other chronic pain: Secondary | ICD-10-CM | POA: Diagnosis not present

## 2024-06-10 DIAGNOSIS — Z8673 Personal history of transient ischemic attack (TIA), and cerebral infarction without residual deficits: Secondary | ICD-10-CM | POA: Diagnosis not present

## 2024-06-10 LAB — BASIC METABOLIC PANEL WITH GFR
Anion gap: 9 (ref 5–15)
BUN: 13 mg/dL (ref 8–23)
CO2: 23 mmol/L (ref 22–32)
Calcium: 8.9 mg/dL (ref 8.9–10.3)
Chloride: 105 mmol/L (ref 98–111)
Creatinine, Ser: 1.21 mg/dL (ref 0.61–1.24)
GFR, Estimated: >60 mL/min (ref >60)
Glucose, Bld: 111 mg/dL — ABNORMAL HIGH (ref 70–99)
Potassium: 3.8 mmol/L (ref 3.5–5.1)
Sodium: 137 mmol/L (ref 135–145)

## 2024-06-10 LAB — CBC
HCT: 31.6 % — ABNORMAL LOW (ref 39.0–52.0)
Hemoglobin: 10.3 g/dL — ABNORMAL LOW (ref 13.0–17.0)
MCH: 28.2 pg (ref 26.0–34.0)
MCHC: 32.6 g/dL (ref 30.0–36.0)
MCV: 86.6 fL (ref 80.0–100.0)
Platelets: 169 K/uL (ref 150–400)
RBC: 3.65 MIL/uL — ABNORMAL LOW (ref 4.22–5.81)
RDW: 13.8 % (ref 11.5–15.5)
WBC: 6 K/uL (ref 4.0–10.5)
nRBC: 0 % (ref 0.0–0.2)

## 2024-06-10 LAB — HEPARIN LEVEL (UNFRACTIONATED)
Heparin Unfractionated: 0.71 [IU]/mL — ABNORMAL HIGH (ref 0.30–0.70)
Heparin Unfractionated: 0.42 [IU]/mL (ref 0.30–0.70)

## 2024-06-10 LAB — LIPOPROTEIN A (LPA): Lipoprotein (a): 199.4 nmol/L — ABNORMAL HIGH (ref <75.0)

## 2024-06-10 NOTE — Progress Notes (Signed)
 Discussed NSTEMI, restrictions, managing risk factors (HTN, HLD, complete cessation from tobacco products, and reducing stress. Pt does participate in exercise (mainly upper-body) and would like referral to cardiac rehab at [facility]. Pt received materials on exercise guidelines and HH diet.   Garen FORBES Candy MS, ACSM-CEP  06/10/2024 2:53 PM

## 2024-06-10 NOTE — Progress Notes (Signed)
 PHARMACY - ANTICOAGULATION CONSULT NOTE  Pharmacy Consult for Heparin  Indication: chest pain/ACS  Allergies  Allergen Reactions   Ibuprofen  Itching   Lisinopril Other (See Comments)    Swollen lips   Pregabalin  Swelling    Recently filled 11/24 for 90ds and reports taking on 08/11/23   Gabapentin  Other (See Comments)    Dizziness    Patient Measurements: Height: 5' 7 (170.2 cm) Weight: 85.3 kg (188 lb 0.8 oz) IBW/kg (Calculated) : 66.1 HEPARIN  DW (KG): 83.4  Vital Signs: Temp: 98.6 F (37 C) (10/23 1142) Temp Source: Oral (10/23 1142) BP: 135/92 (10/23 1142) Pulse Rate: 71 (10/23 1142)  Labs: Recent Labs    06/07/24 2022 06/07/24 2244 06/08/24 0618 06/08/24 0909 06/09/24 0627 06/10/24 0247 06/10/24 1607  HGB 11.7*  --   --   --  10.9* 10.3*  --   HCT 36.4*  --   --   --  33.3* 31.6*  --   PLT 202  --   --   --  194 169  --   HEPARINUNFRC  --   --   --    < > 0.52 0.71* 0.42  CREATININE 1.24  --  1.06  --  1.19 1.21  --   TROPONINIHS 20* 116*  --   --   --   --   --    < > = values in this interval not displayed.    Estimated Creatinine Clearance: 50 mL/min (by C-G formula based on SCr of 1.21 mg/dL).   Medical History: Past Medical History:  Diagnosis Date   Anemia, unspecified 06/08/2013   Cataract    CHF (congestive heart failure) (HCC)    Chronic kidney disease    Diabetes mellitus    Foot drop, left foot    AFO   Hypertension    PAD (peripheral artery disease)    S/P lumbar fusion    Sequelae of cerebral infarction    Stroke [facility])     Medications:  No current facility-administered medications on file prior to encounter.   Current Outpatient Medications on File Prior to Encounter  Medication Sig Dispense Refill   allopurinol  (ZYLOPRIM ) 100 MG tablet Take 100 mg by mouth daily as needed (for gout flareups).     amLODipine  (NORVASC ) 10 MG tablet Take 0.5 tablets (5 mg total) by mouth daily.     aspirin  EC 81 MG tablet Take 81 mg by mouth  daily.     atorvastatin  (LIPITOR) 20 MG tablet Take 1 tablet (20 mg total) by mouth daily. (Patient taking differently: Take 20 mg by mouth at bedtime.) 30 tablet 0   colchicine  0.6 MG tablet Take 1 tablet (0.6 mg total) daily by mouth. (Patient taking differently: Take 0.6 mg by mouth daily as needed (gout pain).) 30 tablet 0   cyclobenzaprine  (FLEXERIL ) 5 MG tablet Take 1 tablet (5 mg total) by mouth 2 (two) times daily as needed for muscle spasms. 20 tablet 0   ferrous sulfate  325 (65 FE) MG tablet Take 325 mg by mouth daily with breakfast.     lidocaine  (LIDODERM ) 5 % Place 1 patch onto the skin daily. Remove & Discard patch within 12 hours or as directed by MD 30 patch 0   meclizine  (ANTIVERT ) 12.5 MG tablet Take 1 tablet (12.5 mg total) by mouth 3 (three) times daily as needed for dizziness. 30 tablet 0   meloxicam (MOBIC) 7.5 MG tablet Take 1 tablet (7.5 mg total) by mouth daily. 30 tablet 0  oxyCODONE -acetaminophen  (PERCOCET/ROXICET) 5-325 MG tablet Take 1 tablet by mouth every 8 (eight) hours as needed for up to 10 doses for severe pain (pain score 7-10). 10 tablet 0   polyethylene glycol (MIRALAX  / GLYCOLAX ) 17 g packet Take 17 g by mouth daily as needed for mild constipation. 14 each 0   potassium chloride  (KLOR-CON  M) 10 MEQ tablet Take 10 mEq by mouth daily.     valsartan  (DIOVAN ) 320 MG tablet Take 320 mg by mouth daily.     omeprazole  (PRILOSEC) 40 MG capsule Take 1 capsule (40 mg total) by mouth daily. 30 capsule 0     Assessment: 83 y.o. male with chest pain with PMH of CHF, HTN, CKD, PAD, DM, and stroke.   He is now s/p cath with multivessel CAD for CABG evaluation  Heparin  level is therapeutic this afternoon at 0.42. No issues with infusion or bleeding reported per RN.   Goal of Therapy:  Heparin  level 0.3-0.7 units/ml Monitor platelets by anticoagulation protocol: Yes   Plan:  Continue heparin  at 1000 units/hr Daily heparin  level and CBC Monitor for signs of  bleeding / pauses w/ gtt    Thank you for allowing pharmacy to be a part of this patient's care.   Bascom JAYSON Louder, PharmD 06/10/2024 5:35 PM  **Pharmacist phone directory can be found on [email or web address] listed under [facility]**

## 2024-06-10 NOTE — Progress Notes (Addendum)
 Rounding Note   Patient Name: Luis Poole Date of Encounter: 06/10/2024  [facility] Cardiologist: Maude Emmer, MD   Subjective - No acute events overnight - Patient without complaints today  Scheduled Meds:  amLODipine   5 mg Oral Daily   aspirin  EC  81 mg Oral Daily   atorvastatin   80 mg Oral Daily   irbesartan   300 mg Oral Daily   lidocaine   1 patch Transdermal Q24H   pantoprazole   40 mg Oral Daily   sodium chloride  flush  3 mL Intravenous Q12H   Continuous Infusions:  heparin  1,000 Units/hr (06/10/24 0747)   PRN Meds: acetaminophen , allopurinol , colchicine , cyclobenzaprine , nitroGLYCERIN, ondansetron  (ZOFRAN ) IV, oxyCODONE , polyethylene glycol, sodium chloride  flush   Vital Signs  Vitals:   06/10/24 0342 06/10/24 0427 06/10/24 0745 06/10/24 0800  BP:  (!) 146/76 (!) 152/81 (!) 163/69  Pulse:  67 77 76  Resp:  18 20 16   Temp:  99.2 F (37.3 C) 98.6 F (37 C) 98.6 F (37 C)  TempSrc:  Oral Oral Oral  SpO2:  97% 100%   Weight: 85.3 kg     Height:        Intake/Output Summary (Last 24 hours) at 06/10/2024 1109 Last data filed at 06/10/2024 0900 Gross per 24 hour  Intake 460 ml  Output 1905 ml  Net -1445 ml      06/10/2024    3:42 AM 06/09/2024    4:00 AM 06/07/2024    8:15 PM  Last 3 Weights  Weight (lbs) 188 lb 0.8 oz 188 lb 0.8 oz 195 lb  Weight (kg) 85.3 kg 85.3 kg 88.451 kg      Telemetry NSR- Personally Reviewed  ECG  No new ECG  Physical Exam  GEN: No acute distress.   Neck: No JVD Cardiac: RRR, no murmurs, rubs, or gallops.  Respiratory: Clear to auscultation bilaterally. GI: Soft, nontender, non-distended  MS: No edema; No deformity. Neuro:  Nonfocal  Psych: Normal affect   Labs High Sensitivity Troponin:   Recent Labs  Lab 05/22/24 2248 05/23/24 0045 06/07/24 2022 06/07/24 2244  TROPONINIHS 7 7 20* 116*     Chemistry Recent Labs  Lab 06/08/24 0618 06/09/24 0627 06/10/24 0247  NA 137 136 137  K 3.2*  4.0 3.8  CL 107 102 105  CO2 22 23 23   GLUCOSE 105* 126* 111*  BUN 13 11 13   CREATININE 1.06 1.19 1.21  CALCIUM  8.5* 8.7* 8.9  GFRNONAA >60 >60 >60  ANIONGAP 8 11 9     Lipids  Recent Labs  Lab 06/08/24 0620  CHOL 159  TRIG 28  HDL 40*  LDLCALC 113*  CHOLHDL 4.0    Hematology Recent Labs  Lab 06/07/24 2022 06/09/24 0627 06/10/24 0247  WBC 4.4 5.2 6.0  RBC 4.12* 3.82* 3.65*  HGB 11.7* 10.9* 10.3*  HCT 36.4* 33.3* 31.6*  MCV 88.3 87.2 86.6  MCH 28.4 28.5 28.2  MCHC 32.1 32.7 32.6  RDW 14.0 14.2 13.8  PLT 202 194 169   Thyroid  No results for input(s): TSH, FREET4 in the last 168 hours.  BNPNo results for input(s): BNP, PROBNP in the last 168 hours.  DDimer No results for input(s): DDIMER in the last 168 hours.   Radiology  VAS US  DOPPLER PRE CABG Result Date: 06/09/2024 PREOPERATIVE VASCULAR EVALUATION Patient Name:  Luis Poole  Date of Exam:   06/09/2024 Medical Rec #: [id number]      Accession #:    [id number] Date of  Birth: [birth year]       Patient Gender: M Patient Age:   83 years Exam Location:  [facility] Procedure:      VAS US  DOPPLER PRE CABG Referring Phys: HARRELL LIGHTFOOT --------------------------------------------------------------------------------  Indications:      Pre-CABG. Risk Factors:     Hypertension, Diabetes, past history of smoking, prior MI,                   coronary artery disease, prior CVA, PAD. Other Factors:    CHF, CKD. Comparison Study: Previous carotid duplex on 10/07/2017 bilateral ICAs 1-39% Performing Technologist: Leigh Rom RVT, RDMS  Examination Guidelines: A complete evaluation includes B-mode imaging, spectral Doppler, color Doppler, and power Doppler as needed of all accessible portions of each vessel. Bilateral testing is considered an integral part of a complete examination. Limited examinations for reoccurring indications may be performed as noted.  Right Carotid Findings:  +----------+--------+--------+--------+-------------------------+--------+           PSV cm/sEDV cm/sStenosisDescribe                 Comments +----------+--------+--------+--------+-------------------------+--------+ CCA Prox  46      7               heterogenous                      +----------+--------+--------+--------+-------------------------+--------+ CCA Distal54      11              heterogenous                      +----------+--------+--------+--------+-------------------------+--------+ ICA Prox  52      16              calcific and heterogenous         +----------+--------+--------+--------+-------------------------+--------+ ICA Distal49      16                                                +----------+--------+--------+--------+-------------------------+--------+ ECA       260     11      >50%    calcific                          +----------+--------+--------+--------+-------------------------+--------+ +----------+--------+-------+----------------+------------+           PSV cm/sEDV cmsDescribe        Arm Pressure +----------+--------+-------+----------------+------------+ Subclavian62             Multiphasic, TWO840          +----------+--------+-------+----------------+------------+ +---------+--------+--+--------+-+---------+ VertebralPSV cm/s22EDV cm/s5Antegrade +---------+--------+--+--------+-+---------+ Left Carotid Findings: +----------+-------+--------+--------+-----------------------+-----------------+           PSV    EDV cm/sStenosisDescribe               Comments                    cm/s                                                            +----------+-------+--------+--------+-----------------------+-----------------+ CCA Prox  123    17  heterogenous                             +----------+-------+--------+--------+-----------------------+-----------------+ CCA Distal43     8                heterogenous           intimal                                                                   thickening        +----------+-------+--------+--------+-----------------------+-----------------+ ICA Prox  28     9               heterogenous and                                                          calcific                                 +----------+-------+--------+--------+-----------------------+-----------------+ ICA Distal59     17                                                       +----------+-------+--------+--------+-----------------------+-----------------+ ECA       83     0                                                        +----------+-------+--------+--------+-----------------------+-----------------+  +----------+--------+--------+----------------+------------+ SubclavianPSV cm/sEDV cm/sDescribe        Arm Pressure +----------+--------+--------+----------------+------------+           136             Multiphasic, [id number]          +----------+--------+--------+----------------+------------+ +---------+--------+--+--------+--+---------+ VertebralPSV cm/s38EDV cm/s12Antegrade +---------+--------+--+--------+--+---------+  ABI Findings: +------------------+-----+----------+ Rt Pressure (mmHg)IndexWaveform   +------------------+-----+----------+ 159                    triphasic  +------------------+-----+----------+ 102               0.64 monophasic +------------------+-----+----------+ 55                0.35 monophasic +------------------+-----+----------+ 44                0.28 Abnormal   +------------------+-----+----------+ +------------------+-----+----------+ Lt Pressure (mmHg)IndexWaveform   +------------------+-----+----------+ 153                    triphasic  +------------------+-----+----------+ 122               0.77 monophasic +------------------+-----+----------+ 61                0.38  monophasic +------------------+-----+----------+  32                0.20 Abnormal   +------------------+-----+----------+  Right Doppler Findings: +--------+--------+---------+ Site    PressureDoppler   +--------+--------+---------+ [id number]     triphasic +--------+--------+---------+ Radial          triphasic +--------+--------+---------+ Ulnar           triphasic +--------+--------+---------+  Left Doppler Findings: +--------+--------+---------+ Site    PressureDoppler   +--------+--------+---------+ [id number]     triphasic +--------+--------+---------+ Radial          triphasic +--------+--------+---------+ Ulnar           triphasic +--------+--------+---------+   Summary: Right Carotid: Velocities in the right ICA are consistent with a 1-39% stenosis. Left Carotid: Velocities in the left ICA are consistent with a 1-39% stenosis. Vertebrals:  Bilateral vertebral arteries demonstrate antegrade flow. Subclavians: Normal flow hemodynamics were seen in bilateral subclavian              arteries. Right ABI: Resting right ankle-brachial index indicates moderate right lower extremity arterial disease. The right toe-brachial index is abnormal. Left ABI: Resting left ankle-brachial index indicates moderate left lower extremity arterial disease. The left toe-brachial index is abnormal. Bilateral Extremity: Doppler waveforms remain within normal limits with compression bilaterally for the radial arteries. Doppler waveforms remain within normal limits with compression bilaterally for the ulnar arteries.  Electronically signed by Gaile New MD on 06/09/2024 at 7:28:17 PM.    Final    CARDIAC CATHETERIZATION Result Date: 06/08/2024 Images from the original result were not included.   Prox RCA lesion is 40% stenosed. Cardiac Catheterization 06/08/24: Hemodynamic data: LVEDP 9 mmHg.  There is no pressure gradient across the aortic valve. Angiographic data: LM: Logical vessel,  smooth and normal. LAD: Is a large vessel, gives origin to a very large D1 with secondary branch.  D1 has a proximal focal 90% stenosis.  Mid LAD has a focal 90% stenosis.  Rest of the LAD has mild disease. LCx: Gives origin to moderate-sized OM1 with ostial 95% stenosis followed by subtotally occluded mid CX and gives origin to a very small OM 2. RCA: Dominant vessel.  Proximal 30-40% eccentric calcific stenosis. Small PDA with severe diffuse disease.  Moderate-sized PL branch without significant disease. Impression and recommendations: Patient has 3 focal lesions in a moderate-sized OM1, large D1 and mid LAD.  Patient may benefit from CT surgical evaluation to see if would be a good candidate for surgery.  Restart IV heparin  in view of ACS 4 to 6 hours after sheath pull. Could consider multivessel PCI as an option as well if not felt to be a surgical candidate.   ECHOCARDIOGRAM LIMITED Result Date: 06/08/2024    ECHOCARDIOGRAM LIMITED REPORT   Patient Name:   Luis Poole Date of Exam: 06/08/2024 Medical Rec #:  [id number]     Height:       67.0 in Accession #:    [id number]    Weight:       195.0 lb Date of Birth:  10-29-1940      BSA:          2.001 m Patient Age:    83 years      BP:           159/75 mmHg Patient Gender: M             HR:           60 bpm. Exam Location:  Inpatient Procedure: Limited  Echo, Cardiac Doppler and Color Doppler (Both Spectral and            Color Flow Doppler were utilized during procedure). Indications:    NStemi  History:        Patient has prior history of Echocardiogram examinations, most                 recent 12/25/2023.  Sonographer:    VALENTE, ADAM Referring Phys: Foye Damron IMPRESSIONS  1. Suboptimal evaluation of the left ventricular wall motion. Cannot exclude basal inferior and inferolateral hypokinesis, but otherwise appears to have normal wall motion. Left ventricular ejection fraction, by estimation, is 65 to 70%. The left ventricle has normal function. Left  ventricular endocardial border not optimally defined to evaluate regional wall motion. Left ventricular diastolic function could not be evaluated.  2. Right ventricular systolic function is normal. The right ventricular size is normal.  3. The aortic valve is tricuspid. There is mild calcification of the aortic valve. Aortic valve regurgitation is not visualized. Aortic valve sclerosis/calcification is present, without any evidence of aortic stenosis. FINDINGS  Left Ventricle: Suboptimal evaluation of the left ventricular wall motion. Cannot exclude basal inferior and inferolateral hypokinesis, but otherwise appears to have normal wall motion. Left ventricular ejection fraction, by estimation, is 65 to 70%. The left ventricle has normal function. Left ventricular endocardial border not optimally defined to evaluate regional wall motion. The left ventricular internal cavity size was normal in size. There is no left ventricular hypertrophy. Left ventricular diastolic function could not be evaluated. Right Ventricle: The right ventricular size is normal. Right vetricular wall thickness was not well visualized. Right ventricular systolic function is normal. Right Atrium: Prominent Eustachian valve. Pericardium: Trivial pericardial effusion is present. Tricuspid Valve: The tricuspid valve is grossly normal. Tricuspid valve regurgitation is trivial. Aortic Valve: The aortic valve is tricuspid. There is mild calcification of the aortic valve. Aortic valve regurgitation is not visualized. Aortic valve sclerosis/calcification is present, without any evidence of aortic stenosis. Aortic valve mean gradient measures 4.0 mmHg. Aortic valve peak gradient measures 6.9 mmHg. Aortic valve area, by VTI measures 2.74 cm. Pulmonic Valve: The pulmonic valve was not well visualized. Pulmonic valve regurgitation is not visualized. Venous: The inferior vena cava was not well visualized. LEFT VENTRICLE PLAX 2D LVIDd:         4.40 cm LVIDs:          3.20 cm LV PW:         1.10 cm LV IVS:        1.10 cm LVOT diam:     2.10 cm LV SV:         86 LV SV Index:   43 LVOT Area:     3.46 cm  LEFT ATRIUM         Index LA diam:    3.80 cm 1.90 cm/m  AORTIC VALVE AV Area (Vmax):    2.86 cm AV Area (Vmean):   2.87 cm AV Area (VTI):     2.74 cm AV Vmax:           131.00 cm/s AV Vmean:          85.300 cm/s AV VTI:            0.314 m AV Peak Grad:      6.9 mmHg AV Mean Grad:      4.0 mmHg LVOT Vmax:         108.00 cm/s LVOT Vmean:  70.700 cm/s LVOT VTI:          0.248 m LVOT/AV VTI ratio: 0.79  AORTA Ao Root diam: 3.00 cm TRICUSPID VALVE TR Peak grad:   16.0 mmHg TR Vmax:        200.00 cm/s  SHUNTS Systemic VTI:  0.25 m Systemic Diam: 2.10 cm Jerel Balding MD Electronically signed by Jerel Balding MD Signature Date/Time: 06/08/2024/4:02:50 PM    Final     Cardiac Studies   TTE 06/08/24:  IMPRESSIONS     1. Suboptimal evaluation of the left ventricular wall motion. Cannot  exclude basal inferior and inferolateral hypokinesis, but otherwise  appears to have normal wall motion. Left ventricular ejection fraction, by  estimation, is 65 to 70%. The left  ventricle has normal function. Left ventricular endocardial border not  optimally defined to evaluate regional wall motion. Left ventricular  diastolic function could not be evaluated.   2. Right ventricular systolic function is normal. The right ventricular  size is normal.   3. The aortic valve is tricuspid. There is mild calcification of the  aortic valve. Aortic valve regurgitation is not visualized. Aortic valve  sclerosis/calcification is present, without any evidence of aortic  stenosis.    LHC 06/08/24:  Angiographic data: LM: Logical vessel, smooth and normal. LAD: Is a large vessel, gives origin to a very large D1 with secondary branch.  D1 has a proximal focal 90% stenosis.  Mid LAD has a focal 90% stenosis.  Rest of the LAD has mild disease. LCx: Gives origin to  moderate-sized OM1 with ostial 95% stenosis followed by subtotally occluded mid CX and gives origin to a very small OM 2. RCA: Dominant vessel.  Proximal 30-40% eccentric calcific stenosis. Small PDA with severe diffuse disease.  Moderate-sized PL branch without significant disease.  Patient Profile   Luis Poole is a 83 y.o. male with a hx of hypertension, type II diabetes, hyperlipidemia and chronic back pain who is being seen 06/08/2024 for the evaluation of elevated chest at the request of the Emergency Department.   Assessment & Plan    #Type 1 NSTEMI #Severe Multivessel CAD #HLD #Elevated Lp(a) ::Patient presented with chest pain and found to have elevated troponins.  LHC performed on 10/21 demonstrated severe multivessel CAD.  Currently awaiting final recommendations from CT surgery regarding his surgical candidacy.  If he is not a surgical candidate for CABG then he will need multivessel PCI. - Continue heparin  GTT - Continue baby aspirin  daily - Continue atorvastatin  40 mg daily - Continue irbesartan  300 mg daily - Follow-up CT surgery recommendations - Increased atorvastatin  to 80 mg daily  #HTN - BP is adequately controlled. - Continue irbesartan       For questions or updates, please contact [facility] Please consult [email or web address] for contact info under       Signed, Georganna Archer, MD  06/10/2024, 11:09 AM

## 2024-06-10 NOTE — Plan of Care (Signed)

## 2024-06-10 NOTE — Plan of Care (Signed)
  Problem: Cardiac: Goal: Ability to achieve and maintain adequate cardiovascular perfusion will improve Outcome: Progressing   Problem: Clinical Measurements: Goal: Will remain free from infection Outcome: Progressing Goal: Respiratory complications will improve Outcome: Progressing Goal: Cardiovascular complication will be avoided Outcome: Progressing   Problem: Elimination: Goal: Will not experience complications related to bowel motility Outcome: Progressing Goal: Will not experience complications related to urinary retention Outcome: Progressing   Problem: Safety: Goal: Ability to remain free from injury will improve Outcome: Progressing

## 2024-06-10 NOTE — Plan of Care (Signed)
   Problem: Activity: Goal: Ability to tolerate increased activity will improve Outcome: Progressing
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
## 2024-06-10 NOTE — Progress Notes (Signed)
 PHARMACY - ANTICOAGULATION CONSULT NOTE  Pharmacy Consult for Heparin  Indication: chest pain/ACS  Allergies  Allergen Reactions   Ibuprofen  Itching   Lisinopril Other (See Comments)    Swollen lips   Pregabalin  Swelling    Recently filled 11/24 for 90ds and reports taking on 08/11/23   Gabapentin  Other (See Comments)    Dizziness    Patient Measurements: Height: 5' 7 (170.2 cm) Weight: 85.3 kg (188 lb 0.8 oz) IBW/kg (Calculated) : 66.1 HEPARIN  DW (KG): 83.4  Vital Signs: Temp: 99.2 F (37.3 C) (10/23 0427) Temp Source: Oral (10/23 0427) BP: 146/76 (10/23 0427) Pulse Rate: 67 (10/23 0427)  Labs: Recent Labs    06/07/24 2022 06/07/24 2244 06/08/24 0618 06/08/24 0909 06/09/24 0627 06/10/24 0247  HGB 11.7*  --   --   --  10.9* 10.3*  HCT 36.4*  --   --   --  33.3* 31.6*  PLT 202  --   --   --  194 169  HEPARINUNFRC  --   --   --  0.44 0.52 0.71*  CREATININE 1.24  --  1.06  --  1.19 1.21  TROPONINIHS 20* 116*  --   --   --   --     Estimated Creatinine Clearance: 50 mL/min (by C-G formula based on SCr of 1.21 mg/dL).   Medical History: Past Medical History:  Diagnosis Date   Anemia, unspecified 06/08/2013   Cataract    CHF (congestive heart failure) (HCC)    Chronic kidney disease    Diabetes mellitus    Foot drop, left foot    AFO   Hypertension    PAD (peripheral artery disease)    S/P lumbar fusion    Sequelae of cerebral infarction    Stroke [facility])     Medications:  No current facility-administered medications on file prior to encounter.   Current Outpatient Medications on File Prior to Encounter  Medication Sig Dispense Refill   allopurinol  (ZYLOPRIM ) 100 MG tablet Take 100 mg by mouth daily as needed (for gout flareups).     amLODipine  (NORVASC ) 10 MG tablet Take 0.5 tablets (5 mg total) by mouth daily.     aspirin  EC 81 MG tablet Take 81 mg by mouth daily.     atorvastatin  (LIPITOR) 20 MG tablet Take 1 tablet (20 mg total) by mouth daily.  (Patient taking differently: Take 20 mg by mouth at bedtime.) 30 tablet 0   colchicine  0.6 MG tablet Take 1 tablet (0.6 mg total) daily by mouth. (Patient taking differently: Take 0.6 mg by mouth daily as needed (gout pain).) 30 tablet 0   cyclobenzaprine  (FLEXERIL ) 5 MG tablet Take 1 tablet (5 mg total) by mouth 2 (two) times daily as needed for muscle spasms. 20 tablet 0   ferrous sulfate  325 (65 FE) MG tablet Take 325 mg by mouth daily with breakfast.     lidocaine  (LIDODERM ) 5 % Place 1 patch onto the skin daily. Remove & Discard patch within 12 hours or as directed by MD 30 patch 0   meclizine  (ANTIVERT ) 12.5 MG tablet Take 1 tablet (12.5 mg total) by mouth 3 (three) times daily as needed for dizziness. 30 tablet 0   meloxicam (MOBIC) 7.5 MG tablet Take 1 tablet (7.5 mg total) by mouth daily. 30 tablet 0   oxyCODONE -acetaminophen  (PERCOCET/ROXICET) 5-325 MG tablet Take 1 tablet by mouth every 8 (eight) hours as needed for up to 10 doses for severe pain (pain score 7-10). 10 tablet 0  polyethylene glycol (MIRALAX  / GLYCOLAX ) 17 g packet Take 17 g by mouth daily as needed for mild constipation. 14 each 0   potassium chloride  (KLOR-CON  M) 10 MEQ tablet Take 10 mEq by mouth daily.     valsartan  (DIOVAN ) 320 MG tablet Take 320 mg by mouth daily.     omeprazole  (PRILOSEC) 40 MG capsule Take 1 capsule (40 mg total) by mouth daily. 30 capsule 0     Assessment: 83 y.o. male with chest pain with PMH of CHF, HTN, CKD, PAD, DM, and stroke.   He is now s/p cath with multivessel CAD for CABG evaluation  10/23 AM update: HL 0.71 Hgb 10.3 PLT wnl No signs of bleeding Paused gtt for ~30 min during bag change last evening   Goal of Therapy:  Heparin  level 0.3-0.7 units/ml Monitor platelets by anticoagulation protocol: Yes   Plan:  -decrease heparin  1000 units/hr -HL 8 hours -Daily heparin  level and CBC -monitor for signs of bleeding / pauses w/ gtt   Benedetta Heath BS, PharmD, BCPS Clinical  Pharmacist 06/10/2024 7:18 AM  Contact: [phone] after 3 PM

## 2024-06-10 NOTE — Progress Notes (Signed)
 Pt's son, Leafy, called unit for update on father. This RN confirmed from pt that he was okay with release of information over the phone and then updated Solomon at [phone]. Pt's son endorsed that he would like the cardiologist to call him tomorrow regarding pt's anticipated heart cath with PCI.

## 2024-06-11 ENCOUNTER — Inpatient Hospital Stay (HOSPITAL_COMMUNITY)
Admission: EM | Disposition: A | Discharge status: Skilled Nursing Facility | Payer: Self-pay | Source: Home / Self Care | Attending: Family Medicine

## 2024-06-11 DIAGNOSIS — I25110 Atherosclerotic heart disease of native coronary artery with unstable angina pectoris: Secondary | ICD-10-CM

## 2024-06-11 DIAGNOSIS — G8929 Other chronic pain: Secondary | ICD-10-CM | POA: Diagnosis not present

## 2024-06-11 DIAGNOSIS — R079 Chest pain, unspecified: Secondary | ICD-10-CM | POA: Diagnosis present

## 2024-06-11 DIAGNOSIS — I214 Non-ST elevation (NSTEMI) myocardial infarction: Secondary | ICD-10-CM | POA: Diagnosis not present

## 2024-06-11 DIAGNOSIS — I10 Essential (primary) hypertension: Secondary | ICD-10-CM | POA: Diagnosis not present

## 2024-06-11 HISTORY — PX: CORONARY STENT INTERVENTION: CATH118234

## 2024-06-11 LAB — CBC
HCT: 31.8 % — ABNORMAL LOW (ref 39.0–52.0)
Hemoglobin: 10.3 g/dL — ABNORMAL LOW (ref 13.0–17.0)
MCH: 28.1 pg (ref 26.0–34.0)
MCHC: 32.4 g/dL (ref 30.0–36.0)
MCV: 86.9 fL (ref 80.0–100.0)
Platelets: 171 K/uL (ref 150–400)
RBC: 3.66 MIL/uL — ABNORMAL LOW (ref 4.22–5.81)
RDW: 14 % (ref 11.5–15.5)
WBC: 5.5 K/uL (ref 4.0–10.5)
nRBC: 0 % (ref 0.0–0.2)

## 2024-06-11 LAB — BASIC METABOLIC PANEL WITH GFR
Anion gap: 9 (ref 5–15)
BUN: 12 mg/dL (ref 8–23)
CO2: 22 mmol/L (ref 22–32)
Calcium: 8.6 mg/dL — ABNORMAL LOW (ref 8.9–10.3)
Chloride: 105 mmol/L (ref 98–111)
Creatinine, Ser: 1.34 mg/dL — ABNORMAL HIGH (ref 0.61–1.24)
GFR, Estimated: 53 mL/min — ABNORMAL LOW (ref >60)
Glucose, Bld: 106 mg/dL — ABNORMAL HIGH (ref 70–99)
Potassium: 3.9 mmol/L (ref 3.5–5.1)
Sodium: 136 mmol/L (ref 135–145)

## 2024-06-11 LAB — HEPARIN LEVEL (UNFRACTIONATED)
Heparin Unfractionated: 0.27 [IU]/mL — ABNORMAL LOW (ref 0.30–0.70)
Heparin Unfractionated: >1.1 [IU]/mL — ABNORMAL HIGH (ref 0.30–0.70)

## 2024-06-11 LAB — POCT ACTIVATED CLOTTING TIME
Activated Clotting Time: 331 s
Activated Clotting Time: 251 s
Activated Clotting Time: 297 s

## 2024-06-11 SURGERY — CORONARY STENT INTERVENTION
Anesthesia: LOCAL

## 2024-06-11 MED ORDER — VERAPAMIL HCL 2.5 MG/ML IV SOLN
INTRAVENOUS | Status: AC
  Filled 2024-06-11: qty 2

## 2024-06-11 MED ORDER — FENTANYL CITRATE (PF) 100 MCG/2ML IJ SOLN
INTRAMUSCULAR | Status: AC
  Filled 2024-06-11: qty 2

## 2024-06-11 MED ORDER — SODIUM CHLORIDE 0.9% FLUSH
3.0000 mL | Freq: Two times a day (BID) | INTRAVENOUS | Status: DC
  Administered 2024-06-12 – 2024-06-18 (×12): 3 mL via INTRAVENOUS

## 2024-06-11 MED ORDER — LIDOCAINE HCL (PF) 1 % IJ SOLN
INTRAMUSCULAR | Status: DC | PRN
  Administered 2024-06-11: 5 mL via INTRADERMAL

## 2024-06-11 MED ORDER — FREE WATER: 500.0000 mL | Freq: Once | Status: DC

## 2024-06-11 MED ORDER — CLOPIDOGREL BISULFATE 300 MG PO TABS
ORAL_TABLET | ORAL | Status: AC
  Filled 2024-06-11: qty 2

## 2024-06-11 MED ORDER — VERAPAMIL HCL 2.5 MG/ML IV SOLN
INTRAVENOUS | Status: DC | PRN
  Administered 2024-06-11: 10 mL via INTRA_ARTERIAL

## 2024-06-11 MED ORDER — HYDRALAZINE HCL 20 MG/ML IJ SOLN: 10.0000 mg | INTRAMUSCULAR | Status: AC | PRN

## 2024-06-11 MED ORDER — ONDANSETRON 4 MG PO TBDP
ORAL_TABLET | ORAL | Status: DC | PRN
  Administered 2024-06-11: 4 mg via ORAL

## 2024-06-11 MED ORDER — SODIUM CHLORIDE 0.9% FLUSH: 3.0000 mL | INTRAVENOUS | Status: DC | PRN

## 2024-06-11 MED ORDER — SODIUM CHLORIDE 0.9 % IV SOLN: INTRAVENOUS | Status: AC

## 2024-06-11 MED ORDER — MIDAZOLAM HCL 2 MG/2ML IJ SOLN
INTRAMUSCULAR | Status: AC
  Filled 2024-06-11: qty 2

## 2024-06-11 MED ORDER — PHENYLEPHRINE 80 MCG/ML (10ML) SYRINGE FOR IV PUSH (FOR BLOOD PRESSURE SUPPORT)
PREFILLED_SYRINGE | INTRAVENOUS | Status: AC
  Filled 2024-06-11: qty 10

## 2024-06-11 MED ORDER — MIDAZOLAM HCL (PF) 2 MG/2ML IJ SOLN
INTRAMUSCULAR | Status: DC | PRN
  Administered 2024-06-11: 1 mg via INTRAVENOUS

## 2024-06-11 MED ORDER — PHENYLEPHRINE 80 MCG/ML (10ML) SYRINGE FOR IV PUSH (FOR BLOOD PRESSURE SUPPORT)
PREFILLED_SYRINGE | INTRAVENOUS | Status: DC | PRN
  Administered 2024-06-11: 240 ug via INTRAVENOUS

## 2024-06-11 MED ORDER — HEPARIN (PORCINE) IN NACL 1000-0.9 UT/500ML-% IV SOLN
INTRAVENOUS | Status: DC | PRN
  Administered 2024-06-11 (×2): 500 mL

## 2024-06-11 MED ORDER — ONDANSETRON HCL 4 MG/2ML IJ SOLN
INTRAMUSCULAR | Status: AC
  Filled 2024-06-11: qty 2

## 2024-06-11 MED ORDER — CLOPIDOGREL BISULFATE 75 MG PO TABS: 600.0000 mg | ORAL_TABLET | Freq: Once | ORAL | Status: DC

## 2024-06-11 MED ORDER — SODIUM CHLORIDE 0.9 % IV SOLN: 250.0000 mL | INTRAVENOUS | Status: AC | PRN

## 2024-06-11 MED ORDER — CLOPIDOGREL BISULFATE 300 MG PO TABS
ORAL_TABLET | ORAL | Status: DC | PRN
  Administered 2024-06-11: 600 mg via ORAL

## 2024-06-11 MED ORDER — HEPARIN SODIUM (PORCINE) 1000 UNIT/ML IJ SOLN
INTRAMUSCULAR | Status: AC
  Filled 2024-06-11: qty 10

## 2024-06-11 MED ORDER — NITROGLYCERIN 1 MG/10 ML FOR IR/CATH LAB
INTRA_ARTERIAL | Status: AC
  Filled 2024-06-11: qty 10

## 2024-06-11 MED ORDER — FENTANYL CITRATE (PF) 100 MCG/2ML IJ SOLN
INTRAMUSCULAR | Status: DC | PRN
  Administered 2024-06-11 (×2): 25 ug via INTRAVENOUS

## 2024-06-11 MED ORDER — CLOPIDOGREL BISULFATE 75 MG PO TABS
75.0000 mg | ORAL_TABLET | Freq: Every day | ORAL | Status: DC
  Administered 2024-06-12 – 2024-06-18 (×7): 75 mg via ORAL
  Filled 2024-06-11 (×7): qty 1

## 2024-06-11 MED ORDER — HEPARIN SODIUM (PORCINE) 1000 UNIT/ML IJ SOLN
INTRAMUSCULAR | Status: DC | PRN
  Administered 2024-06-11: 3000 [IU] via INTRAVENOUS
  Administered 2024-06-11: 8000 [IU] via INTRAVENOUS
  Administered 2024-06-11: 3000 [IU] via INTRAVENOUS

## 2024-06-11 MED ORDER — NOREPINEPHRINE 4 MG/250ML-% IV SOLN
INTRAVENOUS | Status: AC
  Filled 2024-06-11: qty 250

## 2024-06-11 MED ORDER — SODIUM CHLORIDE 0.9 % IV SOLN
INTRAVENOUS | Status: AC | PRN
  Administered 2024-06-11: 999 mL/h via INTRAVENOUS
  Administered 2024-06-11: 75 mL/h via INTRAVENOUS
  Administered 2024-06-11: 10 mL/h via INTRAVENOUS

## 2024-06-11 MED ORDER — LABETALOL HCL 5 MG/ML IV SOLN: 10.0000 mg | INTRAVENOUS | Status: AC | PRN

## 2024-06-11 MED ORDER — IOHEXOL 350 MG/ML SOLN
INTRAVENOUS | Status: DC | PRN
  Administered 2024-06-11: 200 mL

## 2024-06-11 SURGICAL SUPPLY — 17 items
BALLOON EMERGE MR 2.25X12 (BALLOONS) IMPLANT
BALLOON EMERGE MR PUSH 1.5X15 (BALLOONS) IMPLANT
BALLOON EMERGE MR PUSH 1.5X8 (BALLOONS) IMPLANT
CATH VISTA GUIDE 6FR XBLD 3.5 (CATHETERS) IMPLANT
DEVICE RAD COMP TR BAND LRG (VASCULAR PRODUCTS) IMPLANT
GLIDESHEATH SLEND SS 6F .021 (SHEATH) IMPLANT
GUIDEWIRE INQWIRE 1.5J.035X260 (WIRE) IMPLANT
KIT ENCORE 26 ADVANTAGE (KITS) IMPLANT
KIT HEMO VALVE WATCHDOG (MISCELLANEOUS) IMPLANT
PACK CARDIAC CATHETERIZATION (CUSTOM PROCEDURE TRAY) ×1 IMPLANT
SET ATX-X65L (MISCELLANEOUS) IMPLANT
SHEATH PROBE COVER 6X72 (BAG) IMPLANT
STENT SYNERGY XD 2.25X12 (Permanent Stent) IMPLANT
STENT SYNERGY XD 2.50X16 (Permanent Stent) IMPLANT
WIRE ASAHI PROWATER 180CM (WIRE) IMPLANT
WIRE HI TORQ BMW 190CM (WIRE) IMPLANT
WIRE RUNTHROUGH IZANAI 014 180 (WIRE) IMPLANT

## 2024-06-11 NOTE — Progress Notes (Signed)
   06/11/24 1001  Spiritual Encounters  Type of Visit Initial  Care provided to: Patient  Referral source Clinical staff  Reason for visit Routine spiritual support  OnCall Visit No  Spiritual Framework  Presenting Themes Impactful experiences and emotions  Patient Stress Factors Health changes  Family Stress Factors None identified  Interventions  Spiritual Care Interventions Made Prayer;Compassionate presence;Established relationship of care and support  Intervention Outcomes  Outcomes Awareness of support;Awareness of health;Reduced anxiety   Chaplain offered a word of prayer  per the Pt's request, providing emotional and spiritual support.

## 2024-06-11 NOTE — Progress Notes (Signed)
 PHARMACY - ANTICOAGULATION CONSULT NOTE  Pharmacy Consult for heparin  Indication: CAD awaiting CABG vs staged PCI  Labs: Recent Labs    06/09/24 0627 06/10/24 0247 06/10/24 1607 06/11/24 0239  HGB 10.9* 10.3*  --  10.3*  HCT 33.3* 31.6*  --  31.8*  PLT 194 169  --  171  HEPARINUNFRC 0.52 0.71* 0.42 0.27*  CREATININE 1.19 1.21  --  1.34*   Assessment: 83yo male slightly subtherapeutic on heparin  after one level at goal; no infusion issues or signs of bleeding per RN.  Goal of Therapy:  Heparin  level 0.3-0.7 units/ml   Plan:  Increase heparin  infusion slightly to 1100 units/hr. Check level in 8 hours.   Marvetta Dauphin, PharmD, BCPS 06/11/2024 5:22 AM

## 2024-06-11 NOTE — Progress Notes (Signed)
 Rounding Note   Patient Name: Luis Poole Date of Encounter: 06/11/2024  [facility] Cardiologist: Maude Emmer, MD   Subjective - No acute events overnight - Patient denies all symptoms  Scheduled Meds:  amLODipine   5 mg Oral Daily   aspirin  EC  81 mg Oral Daily   atorvastatin   80 mg Oral Daily   clopidogrel  600 mg Oral Once   free water  500 mL Oral Once   irbesartan   300 mg Oral Daily   lidocaine   1 patch Transdermal Q24H   pantoprazole   40 mg Oral Daily   sodium chloride  flush  3 mL Intravenous Q12H   Continuous Infusions:  heparin  1,100 Units/hr (06/11/24 0600)   PRN Meds: acetaminophen , allopurinol , colchicine , cyclobenzaprine , nitroGLYCERIN, ondansetron  (ZOFRAN ) IV, oxyCODONE , polyethylene glycol, sodium chloride  flush   Vital Signs  Vitals:   06/11/24 0450 06/11/24 0451 06/11/24 0735 06/11/24 0835  BP:  119/66 (!) 146/73 (!) 146/73  Pulse:  66 68   Resp:  15 20   Temp:  98.4 F (36.9 C) 98.2 F (36.8 C)   TempSrc:  Oral Oral   SpO2:  99% 100%   Weight: 84 kg     Height:        Intake/Output Summary (Last 24 hours) at 06/11/2024 1009 Last data filed at 06/11/2024 0630 Gross per 24 hour  Intake 854.88 ml  Output 1050 ml  Net -195.12 ml      06/11/2024    4:50 AM 06/10/2024    3:42 AM 06/09/2024    4:00 AM  Last 3 Weights  Weight (lbs) 185 lb 3 oz 188 lb 0.8 oz 188 lb 0.8 oz  Weight (kg) 84 kg 85.3 kg 85.3 kg      Telemetry NSR- Personally Reviewed  ECG  No new ECG  Physical Exam  GEN: No acute distress.   Neck: No JVD Cardiac: RRR, no murmurs, rubs, or gallops.  Respiratory: Clear to auscultation bilaterally. GI: Soft, nontender, non-distended  MS: No edema; No deformity. Neuro:  Nonfocal  Psych: Normal affect   Labs High Sensitivity Troponin:   Recent Labs  Lab 05/22/24 2248 05/23/24 0045 06/07/24 2022 06/07/24 2244  TROPONINIHS 7 7 20* 116*     Chemistry Recent Labs  Lab 06/09/24 0627 06/10/24 0247  06/11/24 0239  NA 136 137 136  K 4.0 3.8 3.9  CL 102 105 105  CO2 23 23 22   GLUCOSE 126* 111* 106*  BUN 11 13 12   CREATININE 1.19 1.21 1.34*  CALCIUM  8.7* 8.9 8.6*  GFRNONAA >60 >60 53*  ANIONGAP 11 9 9     Lipids  Recent Labs  Lab 06/08/24 0620  CHOL 159  TRIG 28  HDL 40*  LDLCALC 113*  CHOLHDL 4.0    Hematology Recent Labs  Lab 06/09/24 0627 06/10/24 0247 06/11/24 0239  WBC 5.2 6.0 5.5  RBC 3.82* 3.65* 3.66*  HGB 10.9* 10.3* 10.3*  HCT 33.3* 31.6* 31.8*  MCV 87.2 86.6 86.9  MCH 28.5 28.2 28.1  MCHC 32.7 32.6 32.4  RDW 14.2 13.8 14.0  PLT 194 169 171   Thyroid  No results for input(s): TSH, FREET4 in the last 168 hours.  BNPNo results for input(s): BNP, PROBNP in the last 168 hours.  DDimer No results for input(s): DDIMER in the last 168 hours.   Radiology  VAS US  DOPPLER PRE CABG Result Date: 06/09/2024 PREOPERATIVE VASCULAR EVALUATION Patient Name:  DELMER KOWALSKI  Date of Exam:   06/09/2024 Medical Rec #:  [id number]      Accession #:    [id number] Date of Birth: 03/07/1941       Patient Gender: M Patient Age:   18 years Exam Location:  [facility] Procedure:      VAS US  DOPPLER PRE CABG Referring Phys: HARRELL LIGHTFOOT --------------------------------------------------------------------------------  Indications:      Pre-CABG. Risk Factors:     Hypertension, Diabetes, past history of smoking, prior MI,                   coronary artery disease, prior CVA, PAD. Other Factors:    CHF, CKD. Comparison Study: Previous carotid duplex on 10/07/2017 bilateral ICAs 1-39% Performing Technologist: Leigh Rom RVT, RDMS  Examination Guidelines: A complete evaluation includes B-mode imaging, spectral Doppler, color Doppler, and power Doppler as needed of all accessible portions of each vessel. Bilateral testing is considered an integral part of a complete examination. Limited examinations for reoccurring indications may be performed as noted.  Right Carotid Findings:  +----------+--------+--------+--------+-------------------------+--------+           PSV cm/sEDV cm/sStenosisDescribe                 Comments +----------+--------+--------+--------+-------------------------+--------+ CCA Prox  46      7               heterogenous                      +----------+--------+--------+--------+-------------------------+--------+ CCA Distal54      11              heterogenous                      +----------+--------+--------+--------+-------------------------+--------+ ICA Prox  52      16              calcific and heterogenous         +----------+--------+--------+--------+-------------------------+--------+ ICA Distal49      16                                                +----------+--------+--------+--------+-------------------------+--------+ ECA       260     11      >50%    calcific                          +----------+--------+--------+--------+-------------------------+--------+ +----------+--------+-------+----------------+------------+           PSV cm/sEDV cmsDescribe        Arm Pressure +----------+--------+-------+----------------+------------+ Subclavian62             Multiphasic, [id number]          +----------+--------+-------+----------------+------------+ +---------+--------+--+--------+-+---------+ VertebralPSV cm/s22EDV cm/s5Antegrade +---------+--------+--+--------+-+---------+ Left Carotid Findings: +----------+-------+--------+--------+-----------------------+-----------------+           PSV    EDV cm/sStenosisDescribe               Comments                    cm/s                                                            +----------+-------+--------+--------+-----------------------+-----------------+  CCA Prox  123    17              heterogenous                             +----------+-------+--------+--------+-----------------------+-----------------+ CCA Distal43     8                heterogenous           intimal                                                                   thickening        +----------+-------+--------+--------+-----------------------+-----------------+ ICA Prox  28     9               heterogenous and                                                          calcific                                 +----------+-------+--------+--------+-----------------------+-----------------+ ICA Distal59     17                                                       +----------+-------+--------+--------+-----------------------+-----------------+ ECA       83     0                                                        +----------+-------+--------+--------+-----------------------+-----------------+  +----------+--------+--------+----------------+------------+ SubclavianPSV cm/sEDV cm/sDescribe        Arm Pressure +----------+--------+--------+----------------+------------+           136             Multiphasic, [id number]          +----------+--------+--------+----------------+------------+ +---------+--------+--+--------+--+---------+ VertebralPSV cm/s38EDV cm/s12Antegrade +---------+--------+--+--------+--+---------+  ABI Findings: +------------------+-----+----------+ Rt Pressure (mmHg)IndexWaveform   +------------------+-----+----------+ 159                    triphasic  +------------------+-----+----------+ 102               0.64 monophasic +------------------+-----+----------+ 55                0.35 monophasic +------------------+-----+----------+ 44                0.28 Abnormal   +------------------+-----+----------+ +------------------+-----+----------+ Lt Pressure (mmHg)IndexWaveform   +------------------+-----+----------+ 153                    triphasic  +------------------+-----+----------+ 122               0.77 monophasic +------------------+-----+----------+  61                0.38  monophasic +------------------+-----+----------+ 32                0.20 Abnormal   +------------------+-----+----------+  Right Doppler Findings: +--------+--------+---------+ Site    PressureDoppler   +--------+--------+---------+ [id number]     triphasic +--------+--------+---------+ Radial          triphasic +--------+--------+---------+ Ulnar           triphasic +--------+--------+---------+  Left Doppler Findings: +--------+--------+---------+ Site    PressureDoppler   +--------+--------+---------+ [id number]     triphasic +--------+--------+---------+ Radial          triphasic +--------+--------+---------+ Ulnar           triphasic +--------+--------+---------+   Summary: Right Carotid: Velocities in the right ICA are consistent with a 1-39% stenosis. Left Carotid: Velocities in the left ICA are consistent with a 1-39% stenosis. Vertebrals:  Bilateral vertebral arteries demonstrate antegrade flow. Subclavians: Normal flow hemodynamics were seen in bilateral subclavian              arteries. Right ABI: Resting right ankle-brachial index indicates moderate right lower extremity arterial disease. The right toe-brachial index is abnormal. Left ABI: Resting left ankle-brachial index indicates moderate left lower extremity arterial disease. The left toe-brachial index is abnormal. Bilateral Extremity: Doppler waveforms remain within normal limits with compression bilaterally for the radial arteries. Doppler waveforms remain within normal limits with compression bilaterally for the ulnar arteries.  Electronically signed by Gaile New MD on 06/09/2024 at 7:28:17 PM.    Final     Cardiac Studies TTE 06/08/24:   IMPRESSIONS     1. Suboptimal evaluation of the left ventricular wall motion. Cannot  exclude basal inferior and inferolateral hypokinesis, but otherwise  appears to have normal wall motion. Left ventricular ejection fraction, by  estimation, is 65 to 70%.  The left  ventricle has normal function. Left ventricular endocardial border not  optimally defined to evaluate regional wall motion. Left ventricular  diastolic function could not be evaluated.   2. Right ventricular systolic function is normal. The right ventricular  size is normal.   3. The aortic valve is tricuspid. There is mild calcification of the  aortic valve. Aortic valve regurgitation is not visualized. Aortic valve  sclerosis/calcification is present, without any evidence of aortic  stenosis.      LHC 06/08/24:   Angiographic data: LM: Logical vessel, smooth and normal. LAD: Is a large vessel, gives origin to a very large D1 with secondary branch.  D1 has a proximal focal 90% stenosis.  Mid LAD has a focal 90% stenosis.  Rest of the LAD has mild disease. LCx: Gives origin to moderate-sized OM1 with ostial 95% stenosis followed by subtotally occluded mid CX and gives origin to a very small OM 2. RCA: Dominant vessel.  Proximal 30-40% eccentric calcific stenosis. Small PDA with severe diffuse disease.  Moderate-sized PL branch without significant disease.  Patient Profile   BARTH TRELLA is a 83 y.o. male with a hx of hypertension, type II diabetes, hyperlipidemia and chronic back pain who is being seen 06/08/2024 for the evaluation of elevated chest at the request of the Emergency Department.   Assessment & Plan    #Type 1 NSTEMI #Severe Multivessel CAD #HLD #Elevated Lp(a) ::Patient presented with chest pain and found to have elevated troponins.  LHC performed on 10/21 demonstrated severe multivessel CAD.  CT surgery evaluated the patient and felt that  he was not a great surgical candidate due to his physical limitations.  I discussed this patient's case with Dr. Anner and he is agreeable to perform multivessel PCI.  Patient will undergo PCI today. - LHC today with PCI - Continue heparin  GTT - Continue baby aspirin  daily - Continue atorvastatin  40 mg daily - Continue  irbesartan  300 mg daily - Increased atorvastatin  to 80 mg daily   #HTN - BP is adequately controlled. - Continue irbesartan        For questions or updates, please contact [facility] Please consult [email or web address] for contact info under       Signed, Georganna Archer, MD  06/11/2024, 10:09 AM

## 2024-06-11 NOTE — H&P (View-Only) (Signed)
 Rounding Note   Patient Name: Luis Poole Date of Encounter: 06/11/2024  [facility] Cardiologist: Maude Emmer, MD   Subjective - No acute events overnight - Patient denies all symptoms  Scheduled Meds:  amLODipine   5 mg Oral Daily   aspirin  EC  81 mg Oral Daily   atorvastatin   80 mg Oral Daily   clopidogrel  600 mg Oral Once   free water  500 mL Oral Once   irbesartan   300 mg Oral Daily   lidocaine   1 patch Transdermal Q24H   pantoprazole   40 mg Oral Daily   sodium chloride  flush  3 mL Intravenous Q12H   Continuous Infusions:  heparin  1,100 Units/hr (06/11/24 0600)   PRN Meds: acetaminophen , allopurinol , colchicine , cyclobenzaprine , nitroGLYCERIN, ondansetron  (ZOFRAN ) IV, oxyCODONE , polyethylene glycol, sodium chloride  flush   Vital Signs  Vitals:   06/11/24 0450 06/11/24 0451 06/11/24 0735 06/11/24 0835  BP:  119/66 (!) 146/73 (!) 146/73  Pulse:  66 68   Resp:  15 20   Temp:  98.4 F (36.9 C) 98.2 F (36.8 C)   TempSrc:  Oral Oral   SpO2:  99% 100%   Weight: 84 kg     Height:        Intake/Output Summary (Last 24 hours) at 06/11/2024 1009 Last data filed at 06/11/2024 0630 Gross per 24 hour  Intake 854.88 ml  Output 1050 ml  Net -195.12 ml      06/11/2024    4:50 AM 06/10/2024    3:42 AM 06/09/2024    4:00 AM  Last 3 Weights  Weight (lbs) 185 lb 3 oz 188 lb 0.8 oz 188 lb 0.8 oz  Weight (kg) 84 kg 85.3 kg 85.3 kg      Telemetry NSR- Personally Reviewed  ECG  No new ECG  Physical Exam  GEN: No acute distress.   Neck: No JVD Cardiac: RRR, no murmurs, rubs, or gallops.  Respiratory: Clear to auscultation bilaterally. GI: Soft, nontender, non-distended  MS: No edema; No deformity. Neuro:  Nonfocal  Psych: Normal affect   Labs High Sensitivity Troponin:   Recent Labs  Lab 05/22/24 2248 05/23/24 0045 06/07/24 2022 06/07/24 2244  TROPONINIHS 7 7 20* 116*     Chemistry Recent Labs  Lab 06/09/24 0627 06/10/24 0247  06/11/24 0239  NA 136 137 136  K 4.0 3.8 3.9  CL 102 105 105  CO2 23 23 22   GLUCOSE 126* 111* 106*  BUN 11 13 12   CREATININE 1.19 1.21 1.34*  CALCIUM  8.7* 8.9 8.6*  GFRNONAA >60 >60 53*  ANIONGAP 11 9 9     Lipids  Recent Labs  Lab 06/08/24 0620  CHOL 159  TRIG 28  HDL 40*  LDLCALC 113*  CHOLHDL 4.0    Hematology Recent Labs  Lab 06/09/24 0627 06/10/24 0247 06/11/24 0239  WBC 5.2 6.0 5.5  RBC 3.82* 3.65* 3.66*  HGB 10.9* 10.3* 10.3*  HCT 33.3* 31.6* 31.8*  MCV 87.2 86.6 86.9  MCH 28.5 28.2 28.1  MCHC 32.7 32.6 32.4  RDW 14.2 13.8 14.0  PLT 194 169 171   Thyroid  No results for input(s): TSH, FREET4 in the last 168 hours.  BNPNo results for input(s): BNP, PROBNP in the last 168 hours.  DDimer No results for input(s): DDIMER in the last 168 hours.   Radiology  VAS US  DOPPLER PRE CABG Result Date: 06/09/2024 PREOPERATIVE VASCULAR EVALUATION Patient Name:  Luis Poole  Date of Exam:   06/09/2024 Medical Rec #:  [id number]      Accession #:    [id number] Date of Birth: 03/07/1941       Patient Gender: M Patient Age:   83 years Exam Location:  [facility] Procedure:      VAS US  DOPPLER PRE CABG Referring Phys: HARRELL LIGHTFOOT --------------------------------------------------------------------------------  Indications:      Pre-CABG. Risk Factors:     Hypertension, Diabetes, past history of smoking, prior MI,                   coronary artery disease, prior CVA, PAD. Other Factors:    CHF, CKD. Comparison Study: Previous carotid duplex on 10/07/2017 bilateral ICAs 1-39% Performing Technologist: Leigh Rom RVT, RDMS  Examination Guidelines: A complete evaluation includes B-mode imaging, spectral Doppler, color Doppler, and power Doppler as needed of all accessible portions of each vessel. Bilateral testing is considered an integral part of a complete examination. Limited examinations for reoccurring indications may be performed as noted.  Right Carotid Findings:  +----------+--------+--------+--------+-------------------------+--------+           PSV cm/sEDV cm/sStenosisDescribe                 Comments +----------+--------+--------+--------+-------------------------+--------+ CCA Prox  46      7               heterogenous                      +----------+--------+--------+--------+-------------------------+--------+ CCA Distal54      11              heterogenous                      +----------+--------+--------+--------+-------------------------+--------+ ICA Prox  52      16              calcific and heterogenous         +----------+--------+--------+--------+-------------------------+--------+ ICA Distal49      16                                                +----------+--------+--------+--------+-------------------------+--------+ ECA       260     11      >50%    calcific                          +----------+--------+--------+--------+-------------------------+--------+ +----------+--------+-------+----------------+------------+           PSV cm/sEDV cmsDescribe        Arm Pressure +----------+--------+-------+----------------+------------+ Subclavian62             Multiphasic, TWO840          +----------+--------+-------+----------------+------------+ +---------+--------+--+--------+-+---------+ VertebralPSV cm/s22EDV cm/s5Antegrade +---------+--------+--+--------+-+---------+ Left Carotid Findings: +----------+-------+--------+--------+-----------------------+-----------------+           PSV    EDV cm/sStenosisDescribe               Comments                    cm/s                                                            +----------+-------+--------+--------+-----------------------+-----------------+  CCA Prox  123    17              heterogenous                             +----------+-------+--------+--------+-----------------------+-----------------+ CCA Distal43     8                heterogenous           intimal                                                                   thickening        +----------+-------+--------+--------+-----------------------+-----------------+ ICA Prox  28     9               heterogenous and                                                          calcific                                 +----------+-------+--------+--------+-----------------------+-----------------+ ICA Distal59     17                                                       +----------+-------+--------+--------+-----------------------+-----------------+ ECA       83     0                                                        +----------+-------+--------+--------+-----------------------+-----------------+  +----------+--------+--------+----------------+------------+ SubclavianPSV cm/sEDV cm/sDescribe        Arm Pressure +----------+--------+--------+----------------+------------+           136             Multiphasic, [id number]          +----------+--------+--------+----------------+------------+ +---------+--------+--+--------+--+---------+ VertebralPSV cm/s38EDV cm/s12Antegrade +---------+--------+--+--------+--+---------+  ABI Findings: +------------------+-----+----------+ Rt Pressure (mmHg)IndexWaveform   +------------------+-----+----------+ 159                    triphasic  +------------------+-----+----------+ 102               0.64 monophasic +------------------+-----+----------+ 55                0.35 monophasic +------------------+-----+----------+ 44                0.28 Abnormal   +------------------+-----+----------+ +------------------+-----+----------+ Lt Pressure (mmHg)IndexWaveform   +------------------+-----+----------+ 153                    triphasic  +------------------+-----+----------+ 122               0.77 monophasic +------------------+-----+----------+  61                0.38  monophasic +------------------+-----+----------+ 32                0.20 Abnormal   +------------------+-----+----------+  Right Doppler Findings: +--------+--------+---------+ Site    PressureDoppler   +--------+--------+---------+ [id number]     triphasic +--------+--------+---------+ Radial          triphasic +--------+--------+---------+ Ulnar           triphasic +--------+--------+---------+  Left Doppler Findings: +--------+--------+---------+ Site    PressureDoppler   +--------+--------+---------+ [id number]     triphasic +--------+--------+---------+ Radial          triphasic +--------+--------+---------+ Ulnar           triphasic +--------+--------+---------+   Summary: Right Carotid: Velocities in the right ICA are consistent with a 1-39% stenosis. Left Carotid: Velocities in the left ICA are consistent with a 1-39% stenosis. Vertebrals:  Bilateral vertebral arteries demonstrate antegrade flow. Subclavians: Normal flow hemodynamics were seen in bilateral subclavian              arteries. Right ABI: Resting right ankle-brachial index indicates moderate right lower extremity arterial disease. The right toe-brachial index is abnormal. Left ABI: Resting left ankle-brachial index indicates moderate left lower extremity arterial disease. The left toe-brachial index is abnormal. Bilateral Extremity: Doppler waveforms remain within normal limits with compression bilaterally for the radial arteries. Doppler waveforms remain within normal limits with compression bilaterally for the ulnar arteries.  Electronically signed by Gaile New MD on 06/09/2024 at 7:28:17 PM.    Final     Cardiac Studies TTE 06/08/24:   IMPRESSIONS     1. Suboptimal evaluation of the left ventricular wall motion. Cannot  exclude basal inferior and inferolateral hypokinesis, but otherwise  appears to have normal wall motion. Left ventricular ejection fraction, by  estimation, is 65 to 70%.  The left  ventricle has normal function. Left ventricular endocardial border not  optimally defined to evaluate regional wall motion. Left ventricular  diastolic function could not be evaluated.   2. Right ventricular systolic function is normal. The right ventricular  size is normal.   3. The aortic valve is tricuspid. There is mild calcification of the  aortic valve. Aortic valve regurgitation is not visualized. Aortic valve  sclerosis/calcification is present, without any evidence of aortic  stenosis.      LHC 06/08/24:   Angiographic data: LM: Logical vessel, smooth and normal. LAD: Is a large vessel, gives origin to a very large D1 with secondary branch.  D1 has a proximal focal 90% stenosis.  Mid LAD has a focal 90% stenosis.  Rest of the LAD has mild disease. LCx: Gives origin to moderate-sized OM1 with ostial 95% stenosis followed by subtotally occluded mid CX and gives origin to a very small OM 2. RCA: Dominant vessel.  Proximal 30-40% eccentric calcific stenosis. Small PDA with severe diffuse disease.  Moderate-sized PL branch without significant disease.  Patient Profile   Luis Poole is a 83 y.o. male with a hx of hypertension, type II diabetes, hyperlipidemia and chronic back pain who is being seen 06/08/2024 for the evaluation of elevated chest at the request of the Emergency Department.   Assessment & Plan    #Type 1 NSTEMI #Severe Multivessel CAD #HLD #Elevated Lp(a) ::Patient presented with chest pain and found to have elevated troponins.  LHC performed on 10/21 demonstrated severe multivessel CAD.  CT surgery evaluated the patient and felt that  he was not a great surgical candidate due to his physical limitations.  I discussed this patient's case with Dr. Anner and he is agreeable to perform multivessel PCI.  Patient will undergo PCI today. - LHC today with PCI - Continue heparin  GTT - Continue baby aspirin  daily - Continue atorvastatin  40 mg daily - Continue  irbesartan  300 mg daily - Increased atorvastatin  to 80 mg daily   #HTN - BP is adequately controlled. - Continue irbesartan        For questions or updates, please contact [facility] Please consult [email or web address] for contact info under       Signed, Georganna Archer, MD  06/11/2024, 10:09 AM

## 2024-06-11 NOTE — Progress Notes (Signed)
 Upon reviewing orders, pt is supposed to have UNNA boots bilaterally (they were removed yesterday morning). When this was conveyed to pt, he endorsed he wanted to leave them off until the AM despite being educated on their importance. Will notify Ortho Tech at shift change.
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
## 2024-06-11 NOTE — Interval H&P Note (Signed)
 History and Physical Interval Note:  06/11/2024 10:31 AM  Luis Poole Brought  has presented today for surgery, with the diagnosis of CAD.  The various methods of treatment have been discussed with the patient and family. After consideration of risks, benefits and other options for treatment, the patient has consented to  Procedure(s): CORONARY STENT INTERVENTION (N/A) as a surgical intervention.  The patient's history has been reviewed, patient examined, no change in status, stable for surgery.  I have reviewed the patient's chart and labs.  Questions were answered to the patient's satisfaction.     Cath Lab Visit (complete for each Cath Lab visit)  Clinical Evaluation Leading to the Procedure:   ACS: Yes.    Non-ACS:    Anginal Classification: CCS IV  Anti-ischemic medical therapy: Minimal Therapy (1 class of medications)  Non-Invasive Test Results: No non-invasive testing performed  Prior CABG: No previous CABG    Luis Poole

## 2024-06-11 NOTE — Progress Notes (Signed)
 Pt received MI book, will see again post-procedure.

## 2024-06-12 DIAGNOSIS — Z8673 Personal history of transient ischemic attack (TIA), and cerebral infarction without residual deficits: Secondary | ICD-10-CM | POA: Diagnosis not present

## 2024-06-12 DIAGNOSIS — I5032 Chronic diastolic (congestive) heart failure: Secondary | ICD-10-CM

## 2024-06-12 DIAGNOSIS — R079 Chest pain, unspecified: Secondary | ICD-10-CM | POA: Diagnosis not present

## 2024-06-12 DIAGNOSIS — I214 Non-ST elevation (NSTEMI) myocardial infarction: Secondary | ICD-10-CM | POA: Diagnosis not present

## 2024-06-12 LAB — BASIC METABOLIC PANEL WITH GFR
Anion gap: 10 (ref 5–15)
BUN: 12 mg/dL (ref 8–23)
CO2: 20 mmol/L — ABNORMAL LOW (ref 22–32)
Calcium: 8.4 mg/dL — ABNORMAL LOW (ref 8.9–10.3)
Chloride: 101 mmol/L (ref 98–111)
Creatinine, Ser: 1.2 mg/dL (ref 0.61–1.24)
GFR, Estimated: >60 mL/min (ref >60)
Glucose, Bld: 111 mg/dL — ABNORMAL HIGH (ref 70–99)
Potassium: 4 mmol/L (ref 3.5–5.1)
Sodium: 131 mmol/L — ABNORMAL LOW (ref 135–145)

## 2024-06-12 LAB — CBC
HCT: 32.2 % — ABNORMAL LOW (ref 39.0–52.0)
Hemoglobin: 10.4 g/dL — ABNORMAL LOW (ref 13.0–17.0)
MCH: 28.2 pg (ref 26.0–34.0)
MCHC: 32.3 g/dL (ref 30.0–36.0)
MCV: 87.3 fL (ref 80.0–100.0)
Platelets: 178 K/uL (ref 150–400)
RBC: 3.69 MIL/uL — ABNORMAL LOW (ref 4.22–5.81)
RDW: 13.9 % (ref 11.5–15.5)
WBC: 4.8 K/uL (ref 4.0–10.5)
nRBC: 0 % (ref 0.0–0.2)

## 2024-06-12 MED ORDER — CLOPIDOGREL BISULFATE 75 MG PO TABS: 75.0000 mg | ORAL_TABLET | Freq: Every day | ORAL | 11 refills | Status: AC

## 2024-06-12 MED ORDER — ATORVASTATIN CALCIUM 80 MG PO TABS: 80.0000 mg | ORAL_TABLET | Freq: Every day | ORAL | 3 refills | Status: AC

## 2024-06-12 MED ORDER — ASPIRIN EC 81 MG PO TBEC: 81.0000 mg | DELAYED_RELEASE_TABLET | Freq: Every day | ORAL | 11 refills | Status: AC
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
## 2024-06-12 NOTE — Progress Notes (Signed)
 Updated primary cardiologist to Dr. Loni per Dr. Claiborne request (she saw in consult 2024, he last saw 2018).

## 2024-06-12 NOTE — Plan of Care (Signed)

## 2024-06-12 NOTE — Progress Notes (Signed)
 Progress Note  Patient Name: Luis Poole Date of Encounter: 06/12/2024  Primary Cardiologist: Soyla DELENA Merck, MD   Subjective   Patient seen and examined at his bedside. He is status post PCI   Inpatient Medications    Scheduled Meds:  amLODipine   5 mg Oral Daily   aspirin  EC  81 mg Oral Daily   atorvastatin   80 mg Oral Daily   clopidogrel  75 mg Oral Q breakfast   free water  500 mL Oral Once   irbesartan   300 mg Oral Daily   lidocaine   1 patch Transdermal Q24H   pantoprazole   40 mg Oral Daily   sodium chloride  flush  3 mL Intravenous Q12H   sodium chloride  flush  3 mL Intravenous Q12H   Continuous Infusions:  sodium chloride      PRN Meds: sodium chloride , acetaminophen , allopurinol , colchicine , cyclobenzaprine , nitroGLYCERIN, ondansetron  (ZOFRAN ) IV, oxyCODONE , polyethylene glycol, sodium chloride  flush, sodium chloride  flush   Vital Signs    Vitals:   06/11/24 2311 06/12/24 0300 06/12/24 0434 06/12/24 0748  BP: (!) 141/71 134/74  (!) 147/71  Pulse: 79 68 65 65  Resp: 18 16 16 19   Temp: 97.6 F (36.4 C) 98.4 F (36.9 C)  98.7 F (37.1 C)  TempSrc: Oral Oral  Oral  SpO2: 96% 94% 94% 96%  Weight:   83.6 kg   Height:        Intake/Output Summary (Last 24 hours) at 06/12/2024 9092 Last data filed at 06/12/2024 0237 Gross per 24 hour  Intake 2461.59 ml  Output 1200 ml  Net 1261.59 ml   Filed Weights   06/10/24 0342 06/11/24 0450 06/12/24 0434  Weight: 85.3 kg 84 kg 83.6 kg    Telemetry     Sinus rhythm - Personally Reviewed  ECG     - Personally Reviewed  Physical Exam    General: Comfortable, Head: Atraumatic, normal size  Eyes: PEERLA, EOMI  Neck: Supple, normal JVD Cardiac: Normal S1, S2; RRR; no murmurs, rubs, or gallops Lungs: Clear to auscultation bilaterally Abd: Soft, nontender, no hepatomegaly  Ext: warm, no edema Musculoskeletal: No deformities, BUE and BLE strength normal and equal Skin: Warm and dry, no rashes   Neuro:  Alert and oriented to person, place, time, and situation, CNII-XII grossly intact, no focal deficits  Psych: Normal mood and affect   Labs    Chemistry Recent Labs  Lab 06/10/24 0247 06/11/24 0239 06/12/24 0214  NA 137 136 131*  K 3.8 3.9 4.0  CL 105 105 101  CO2 23 22 20*  GLUCOSE 111* 106* 111*  BUN 13 12 12   CREATININE 1.21 1.34* 1.20  CALCIUM  8.9 8.6* 8.4*  GFRNONAA >60 53* >60  ANIONGAP 9 9 10      Hematology Recent Labs  Lab 06/10/24 0247 06/11/24 0239 06/12/24 0214  WBC 6.0 5.5 4.8  RBC 3.65* 3.66* 3.69*  HGB 10.3* 10.3* 10.4*  HCT 31.6* 31.8* 32.2*  MCV 86.6 86.9 87.3  MCH 28.2 28.1 28.2  MCHC 32.6 32.4 32.3  RDW 13.8 14.0 13.9  PLT 169 171 178    Cardiac EnzymesNo results for input(s): TROPONINI in the last 168 hours. No results for input(s): TROPIPOC in the last 168 hours.   BNPNo results for input(s): BNP, PROBNP in the last 168 hours.   DDimer No results for input(s): DDIMER in the last 168 hours.   Radiology    CARDIAC CATHETERIZATION Result Date: 06/11/2024 Images from the original result were not included.  Lesion #1 mid LAD lesion is 90% stenosed.   A drug-eluting stent was successfully placed using a STENT SYNERGY XD 2.50X16 deployed to 2.75 mm. Post intervention, there is a 0% residual stenosis.  TIMI-3 flow maintained.   Lesion #2 1st Diag lesion is 90% stenosed.  TIMI-3 flow   A drug-eluting stent was successfully placed using a STENT SYNERGY XD 2.25X12 => deployed to 2.4 mm.. Post intervention, there is a 0% residual stenosis.  TIMI-3 flow maintained   Lesion #3 mid Cx lesion is 99% stenosed.   Balloon angioplasty was performed using a BALLOON EMERGE MR PUSH 1.5X15. Post intervention, there is a 50% residual stenosis.  TIMI-3 flow maintained   Lesion #4 1st Mrg lesion is 99% stenosed.   Balloon angioplasty was attempted using a BALLOON EMERGE MR PUSH 1.5X8, however were not able to cross the lesion.  Post intervention, there is a 99%  residual stenosis. . Was not able to advance a second wire across the lesion.   Dist Cx lesion is 100% stenosed beyond 2nd Mrg.   --------------------------------------------------------------------------- --------------------   Anticipated discharge date to be determined.   Continue to titrate GDMT Monitor for symptoms, could consider reattempt at PCI of the OM using a different guide catheter and perhaps a more robust wire.   Recommend uninterrupted dual antiplatelet therapy with Aspirin  81mg  daily and Clopidogrel 75mg  daily for a minimum of 12 months (ACS-Class I recommendation).   Would continue Plavix monotherapy after the first year but okay to interrupt. Diagnostic Dominance: Right      Intervention Successful DES PCI to the mid LAD with a 2.5 mm x 16 mm Synergy XD deployed a 2.75 mm reduced into the lesion to 0% Successful direct stent DES PCI of diagonal branch with Synergy XD 2.25 mm x 12 mm deployed to 2.4 mm. PTCA only of mid LCx into OM1 wrist reducing lesion to 50% Unsuccessful PTCA/PCI of OM1-unable to cross lesion with a 1 5 balloon no C due to tortuosity and the actual location of the lesion. Was not able to advance a second wire across the lesion. RECOMMENDATIONS   Anticipated discharge date to be determined.   Continue to titrate GDMT Monitor for symptoms, could consider reattempt at PCI of the OM using a different guide catheter and perhaps a more robust wire.   Recommend uninterrupted dual antiplatelet therapy with Aspirin  81mg  daily and Clopidogrel 75mg  daily for a minimum of 12 months (ACS-Class I recommendation).   Would continue Plavix monotherapy after the first year but okay to interrupt. Alm Clay, MD   Cardiac Studies  Echocardiogram , Cardiac catheterization    Patient Profile     83 y.o. male with presented with NSTEMI, note multivessel CAD , hyperlipidemia and elevated Lpa  Assessment & Plan    NSTEMI Severe multivessel CAD  Elevated Lpa  Hypertension   He is  status post PCI to diagonal and LCX. Doing well post procedures. Will be on DAPT ideally 12 months with Aspirin  81 mg daily and Plavix 75 mg daily. Continue current dose of atorvastatin .   Blood pressure slightly elevated but still less the SBP 150 though, I would prefer he stay on current dose and if elevated in his outpatient follow up we can adjust then.   He complaints of thumb joint pain - no evidence of hematoma at radial puncture site, exam is not consistent with suspicion of pseudoaneurysm. I suspect this is exacerbation of arthritic pain due the the position of hand placement during  cath.   From a CV standpoint he can be discharged to home.   For questions or updates, please contact [facility] Please consult [email or web address] for contact info under Cardiology/STEMI.      Signed, Varian Innes, DO  06/12/2024, 9:07 AM

## 2024-06-12 NOTE — Evaluation (Signed)
 Physical Therapy Evaluation  Patient Details Name: Luis Poole MRN: [id number] DOB: 07-Feb-1941 Today's Date: 06/12/2024  History of Present Illness  Pt is an 83 y/o male who presents 06/07/2024 from home with chest pain and dizziness. Troponin was elevated and found to have NSTEMI, now s/p PCI on 10/24. PMH significant for CHF, CKD, DM, L foot drop with AFO, HTN, PAD, lumbar fusion, CVA.   Clinical Impression  Pt admitted with above diagnosis. Pt currently with functional limitations due to the deficits listed below (see PT Problem List). At the time of PT eval pt was able to sit EOB but unable to progress any further without +2 assistance. Max effort by pt and PT to achieve transfer at EOB but unable to clear hips to stand or laterally scoot. Pt's main complaint is pain - specifically at R hand and B feet (neuropathy). Pt reports limited assistance available at d/c. Recommend skilled PT follow up <3 hours/day at d/c. Pt will benefit from acute skilled PT to increase their independence and safety with mobility to allow discharge.           If plan is discharge home, recommend the following: Two people to help with walking and/or transfers;Two people to help with bathing/dressing/bathroom;Assistance with cooking/housework;Assist for transportation;Help with stairs or ramp for entrance   Can travel by private vehicle   No    Equipment Recommendations None recommended by PT  Recommendations for Other Services  OT consult    Functional Status Assessment Patient has had a recent decline in their functional status and demonstrates the ability to make significant improvements in function in a reasonable and predictable amount of time.     Precautions / Restrictions Precautions Precautions: Fall Recall of Precautions/Restrictions: Intact Required Braces or Orthoses:  (Pt reports having an AFO for L foot drop by I did not see it in the room at the time of eval.) Restrictions Weight Bearing  Restrictions Per Provider Order: No      Mobility  Bed Mobility Overal bed mobility: Needs Assistance Bed Mobility: Supine to Sit, Sit to Supine     Supine to sit: Mod assist Sit to supine: Max assist   General bed mobility comments: Heavy use of rails for support with LUE. Increased time and assist with bed pad to scoot fully to EOB and get feet on the floor. Pt required max assist for return to supine and to scoot up to [facility]. Bed trendelenberged and bed pad utilized to scoot pt up, along with pt reaching overhead to pull himself up with LUE.    Transfers Overall transfer level: Needs assistance Equipment used: Rolling walker (2 wheels), 1 person hand held assist Transfers: Sit to/from Stand, Bed to chair/wheelchair/BSC Sit to Stand: Total assist          Lateral/Scoot Transfers: Total assist General transfer comment: Unable to clear hips to attempt stand at EOB. Also tried scooting laterally up towards [facility] and pt unable to make any advancement with PT's max effort to assist.    Ambulation/Gait                  Stairs            Wheelchair Mobility     Tilt Bed    Modified Rankin (Stroke Patients Only)       Balance Overall balance assessment: Needs assistance Sitting-balance support: Feet supported, Single extremity supported Sitting balance-Leahy Scale: Fair  Pertinent Vitals/Pain Pain Assessment Pain Assessment: 0-10 Pain Score: 7  Pain Location: R hand/wrist Pain Descriptors / Indicators: Numbness, Sharp, Aching Pain Intervention(s): Limited activity within patient's tolerance, Monitored during session, Repositioned    Home Living Family/patient expects to be discharged to:: Private residence Living Arrangements: Children Available Help at Discharge: Family;Available PRN/intermittently Type of Home: House Home Access: Ramped entrance       Home Layout: One level Home Equipment:  Rollator (4 wheels);Shower seat - built in;Grab bars - tub/shower;Wheelchair - manual;Toilet riser;Grab bars - toilet;Rolling Environmental Consultant (2 wheels);Hospital bed      Prior Function Prior Level of Function : Independent/Modified Independent             Mobility Comments: Wheelchair ADLs Comments: Gets in the shower occasionally (a couple times a month) and sponge bathes every other day.     Extremity/Trunk Assessment   Upper Extremity Assessment Upper Extremity Assessment: RUE deficits/detail RUE Deficits / Details: Pt reports significant pain in the R hand that has continued since his procedure. Active and passive ROM of thumb equally painful per pt report. Pt unable to make a fist. He reports pain through the wrist and into the forearm. RUE: Unable to fully assess due to pain    Lower Extremity Assessment Lower Extremity Assessment: RLE deficits/detail;LLE deficits/detail RLE Deficits / Details: Pt reports pain in bottom of his feet and tender R heel from laying in the bed. Able to extend knee somewhat but unable to gain full ROM. LLE Deficits / Details: Foot drop. Noted spontaneous movement of the LLE which pt states has been happening since his spinal cord stimulator was placed. When attempting to stand L foot hovering over the ground and pt reports unable to put it down. Reports pain at bottom of feet.    Cervical / Trunk Assessment Cervical / Trunk Assessment: Other exceptions Cervical / Trunk Exceptions: Spinal cord stimulator present.  Communication   Communication Communication: No apparent difficulties    Cognition Arousal: Alert Behavior During Therapy: WFL for tasks assessed/performed   PT - Cognitive impairments: No apparent impairments                         Following commands: Intact       Cueing Cueing Techniques: Verbal cues, Gestural cues     General Comments      Exercises     Assessment/Plan    PT Assessment Patient needs continued PT  services  PT Problem List Decreased strength;Decreased range of motion;Decreased activity tolerance;Decreased balance;Decreased mobility;Decreased knowledge of use of DME;Decreased safety awareness;Decreased knowledge of precautions;Pain       PT Treatment Interventions DME instruction;Gait training;Functional mobility training;Therapeutic activities;Therapeutic exercise;Balance training;Neuromuscular re-education;Patient/family education;Wheelchair mobility training    PT Goals (Current goals can be found in the Care Plan section)  Acute Rehab PT Goals Patient Stated Goal: Be able to return home PT Goal Formulation: With patient Time For Goal Achievement: 06/26/24 Potential to Achieve Goals: Good    Frequency Min 2X/week     Co-evaluation               AM-PAC PT 6 Clicks Mobility  Outcome Measure Help needed turning from your back to your side while in a flat bed without using bedrails?: A Lot Help needed moving from lying on your back to sitting on the side of a flat bed without using bedrails?: A Lot Help needed moving to and from a bed to a chair (including a wheelchair)?:  Total Help needed standing up from a chair using your arms (e.g., wheelchair or bedside chair)?: Total Help needed to walk in hospital room?: Total Help needed climbing 3-5 steps with a railing? : Total 6 Click Score: 8    End of Session Equipment Utilized During Treatment: Gait belt Activity Tolerance: Patient limited by fatigue;Patient limited by pain Patient left: in bed;with call bell/phone within reach;with bed alarm set Nurse Communication: Mobility status PT Visit Diagnosis: Muscle weakness (generalized) (M62.81);Difficulty in walking, not elsewhere classified (R26.2);Pain Pain - part of body: Hand;Ankle and joints of foot    Time: 1337-1410 PT Time Calculation (min) (ACUTE ONLY): 33 min   Charges:   PT Evaluation $PT Eval Moderate Complexity: 1 Mod PT Treatments $Gait Training: 8-22  mins PT General Charges $$ ACUTE PT VISIT: 1 Visit         Leita Sable, PT, DPT Acute Rehabilitation Services Secure Chat Preferred Office: [phone]   Leita JONETTA Sable 06/12/2024, 2:26 PM

## 2024-06-12 NOTE — Progress Notes (Signed)
 Discussed with pt MI, restrictions, NTG, Plavix, and CRPII. Pt receptive but talkative. Sts he is unsure if his son will help him at home and he might have to rely on himself in his w/c. His right hand is painful to the touch (? Gout). He has not been out of bed since admit per pt. Would benefit from PT c/s. Will refer to [facility] CRPII outpatient. [id number] Aliene Aris BS, ACSM-CEP 06/12/2024 1:01 PM
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
## 2024-06-13 ENCOUNTER — Inpatient Hospital Stay (HOSPITAL_COMMUNITY)

## 2024-06-13 DIAGNOSIS — M7989 Other specified soft tissue disorders: Secondary | ICD-10-CM | POA: Diagnosis not present

## 2024-06-13 DIAGNOSIS — I214 Non-ST elevation (NSTEMI) myocardial infarction: Secondary | ICD-10-CM | POA: Diagnosis not present

## 2024-06-13 LAB — CBC
HCT: 30.3 % — ABNORMAL LOW (ref 39.0–52.0)
Hemoglobin: 9.7 g/dL — ABNORMAL LOW (ref 13.0–17.0)
MCH: 28.2 pg (ref 26.0–34.0)
MCHC: 32 g/dL (ref 30.0–36.0)
MCV: 88.1 fL (ref 80.0–100.0)
Platelets: 174 K/uL (ref 150–400)
RBC: 3.44 MIL/uL — ABNORMAL LOW (ref 4.22–5.81)
RDW: 13.8 % (ref 11.5–15.5)
WBC: 5.3 K/uL (ref 4.0–10.5)
nRBC: 0 % (ref 0.0–0.2)

## 2024-06-13 MED ORDER — FAMOTIDINE 20 MG PO TABS
20.0000 mg | ORAL_TABLET | Freq: Once | ORAL | Status: AC
  Administered 2024-06-13: 20 mg via ORAL
  Filled 2024-06-13: qty 1

## 2024-06-13 MED ORDER — COLCHICINE 0.6 MG PO TABS
0.6000 mg | ORAL_TABLET | Freq: Every day | ORAL | Status: DC
  Administered 2024-06-14 – 2024-06-18 (×4): 0.6 mg via ORAL
  Filled 2024-06-13 (×5): qty 1

## 2024-06-13 MED ORDER — COLCHICINE 0.6 MG PO TABS
0.6000 mg | ORAL_TABLET | Freq: Two times a day (BID) | ORAL | Status: DC
  Administered 2024-06-13: 0.6 mg via ORAL
  Filled 2024-06-13: qty 1

## 2024-06-13 NOTE — Progress Notes (Signed)
 RUE arterial duplex negative for pseudoaneurysm or complication per tech.

## 2024-06-13 NOTE — Plan of Care (Signed)
  Problem: Activity: Goal: Ability to tolerate increased activity will improve Outcome: Progressing   Problem: Education: Goal: Knowledge of General Education information will improve Description: Including pain rating scale, medication(s)/side effects and non-pharmacologic comfort measures Outcome: Progressing   Problem: Health Behavior/Discharge Planning: Goal: Ability to manage health-related needs will improve Outcome: Progressing   Problem: Clinical Measurements: Goal: Will remain free from infection Outcome: Progressing Goal: Diagnostic test results will improve Outcome: Progressing

## 2024-06-13 NOTE — TOC Initial Note (Signed)
 Transition of Care [facility]) - Initial/Assessment Note    Patient Details  Name: Luis Poole MRN: [id number] Date of Birth: [birth year]  [facility] CM/SW Contact:    [name], LCSWA Phone Number: 06/13/2024, 5:05 PM  Clinical Narrative:                  CSW received consult for possible SNF placement at time of discharge. CSW spoke with patient regarding PT recommendation of SNF placement at time of discharge. Patient reports PTA he comes from home alone.Patient expressed understanding of PT recommendation and is agreeable to SNF placement at time of discharge. Patient reports preference for [facility] or [facility] . Patient gave CSW permission to fax out initial referral for SNF placement. CSW discussed insurance authorization process and will provide Medicare SNF ratings list with accepted SNF bed offers when available. All questions answered. No further questions reported at this time.  CSW to continue to follow and assist with discharge planning needs.    Expected Discharge Plan: Skilled Nursing Facility Barriers to Discharge: Continued Medical Work up   Patient Goals and CMS Choice Patient states their goals for this hospitalization and ongoing recovery are:: SNF   Choice offered to / list presented to : Patient      Expected Discharge Plan and Services In-house Referral: Clinical Social Work     Living arrangements for the past 2 months: Single Family Home Expected Discharge Date: 06/12/24                                    Prior Living Arrangements/Services Living arrangements for the past 2 months: Single Family Home Lives with:: Self Patient language and need for interpreter reviewed:: Yes Do you feel safe going back to the place where you live?: No   SNF  Need for Family Participation in Patient Care: Yes (Comment) Care giver support system in place?: Yes (comment)   Criminal Activity/Legal Involvement Pertinent to Current  Situation/Hospitalization: No - Comment as needed  Activities of Daily Living   ADL Screening (condition at time of admission) Independently performs ADLs?: No Does the patient have a NEW difficulty with bathing/dressing/toileting/self-feeding that is expected to last >3 days?: No Does the patient have a NEW difficulty with getting in/out of bed, walking, or climbing stairs that is expected to last >3 days?: No Does the patient have a NEW difficulty with communication that is expected to last >3 days?: No Is the patient deaf or have difficulty hearing?: No Does the patient have difficulty seeing, even when wearing glasses/contacts?: No Does the patient have difficulty concentrating, remembering, or making decisions?: No  Permission Sought/Granted Permission sought to share information with : Case Manager, Magazine Features Editor, Family Supports Permission granted to share information with : Yes, Verbal Permission Granted  Share Information with NAME: scherita  Permission granted to share info w AGENCY: SNF  Permission granted to share info w Relationship: daughter  Permission granted to share info w Contact Information: [name]  Emotional Assessment   Attitude/Demeanor/Rapport: Gracious Affect (typically observed): Calm Orientation: : Oriented to Self, Oriented to Place, Oriented to  Time, Oriented to Situation Alcohol / Substance Use: Not Applicable Psych Involvement: No (comment)  Admission diagnosis:  Dizziness [R42] Chest pain [R07.9] Nonspecific chest pain [R07.9] NSTEMI (non-ST elevated myocardial infarction) [facility]) [I21.4] Patient Active Problem List   Diagnosis Date Noted   Chest pain 06/11/2024   Chronic pain  06/08/2024   Debility 12/25/2023   Lightheadedness 12/24/2023   Coronary artery disease involving native coronary artery of native heart without angina pectoris 08/12/2023   Pressure injury of skin 08/12/2023   Hypokalemia 08/12/2023   AKI (acute  kidney injury) 08/11/2023   Near syncope 11/26/2022   Syncope 10/06/2017   NSTEMI (non-ST elevated myocardial infarction) (HCC) 10/06/2017   Low back pain 06/28/2017   Leg swelling 08/23/2013   Left leg swelling 08/23/2013   Neuropathy 08/23/2013   Acute gout 08/23/2013   D-dimer, elevated 08/23/2013   CKD (chronic kidney disease), stage III (HCC) 08/23/2013   Chronic diastolic heart failure (HCC) 08/23/2013   Normocytic anemia 06/08/2013   Hypertension 08/29/2012   Diabetes mellitus type 2 in nonobese (HCC) 08/29/2012   History of CVA (cerebrovascular accident) 08/28/2012    Class: Acute   L-S radiculopathy 08/28/2012   Lumbar post-laminectomy syndrome 01/15/2012   Thoracic or lumbosacral neuritis or radiculitis, unspecified 01/15/2012   Left hemiparesis (HCC) 01/15/2012   Diabetic peripheral neuropathy (HCC) 01/15/2012   PCP:  Pura Lenis, MD Pharmacy:   [facility] [phone] [place], [place] - [address] AT [facility] [address] [facility] [address] [place] [address] Phone: [phone] Fax: [phone]  [name] [address] [place] [address] Phone: [phone] Fax: [phone]     Social Drivers of Health (SDOH) Social History: SDOH Screenings   Food Insecurity: No Food Insecurity (06/08/2024)  Housing: Low Risk  (06/08/2024)  Transportation Needs: No Transportation Needs (06/08/2024)  Utilities: Not At Risk (06/08/2024)  Financial Resource Strain: Low Risk  (09/25/2023)   Received from [facility]  Physical Activity: Unknown (06/26/2023)   [facility] from [facility]  Social Connections: Moderately Isolated (06/08/2024)  Stress: Stress Concern Present (06/26/2023)   Received from [facility]  Tobacco Use: Medium Risk (06/08/2024)   SDOH Interventions:     Readmission Risk Interventions     No data to display

## 2024-06-13 NOTE — Progress Notes (Signed)
 Progress Note  Patient Name: Luis Poole Date of Encounter: 06/13/2024  Primary Cardiologist: Soyla DELENA Merck, MD   Subjective   Patient seen and examined at his bedside. He is status post PCI   Inpatient Medications    Scheduled Meds:  amLODipine   5 mg Oral Daily   aspirin  EC  81 mg Oral Daily   atorvastatin   80 mg Oral Daily   clopidogrel  75 mg Oral Q breakfast   colchicine   0.6 mg Oral BID   free water  500 mL Oral Once   irbesartan   300 mg Oral Daily   lidocaine   1 patch Transdermal Q24H   pantoprazole   40 mg Oral Daily   sodium chloride  flush  3 mL Intravenous Q12H   sodium chloride  flush  3 mL Intravenous Q12H   Continuous Infusions:   PRN Meds: acetaminophen , allopurinol , cyclobenzaprine , nitroGLYCERIN, ondansetron  (ZOFRAN ) IV, oxyCODONE , polyethylene glycol, sodium chloride  flush, sodium chloride  flush   Vital Signs    Vitals:   06/12/24 1915 06/12/24 2235 06/13/24 0304 06/13/24 0727  BP: 118/63 124/68 124/69 120/67  Pulse:    67  Resp:    19  Temp: 98.9 F (37.2 C) 98.8 F (37.1 C) 98.5 F (36.9 C) 98.9 F (37.2 C)  TempSrc: Oral Oral Oral Oral  SpO2:    97%  Weight:   83.4 kg   Height:        Intake/Output Summary (Last 24 hours) at 06/13/2024 0909 Last data filed at 06/13/2024 0308 Gross per 24 hour  Intake 120 ml  Output 200 ml  Net -80 ml   Filed Weights   06/11/24 0450 06/12/24 0434 06/13/24 0304  Weight: 84 kg 83.6 kg 83.4 kg    Telemetry     Sinus rhythm - Personally Reviewed  ECG     - Personally Reviewed  Physical Exam    General: Comfortable, Head: Atraumatic, normal size  Eyes: PEERLA, EOMI  Neck: Supple, normal JVD Cardiac: Normal S1, S2; RRR; no murmurs, rubs, or gallops Lungs: Clear to auscultation bilaterally Abd: Soft, nontender, no hepatomegaly  Ext: warm, no edema Musculoskeletal: No deformities, BUE and BLE strength normal and equal Skin: Warm and dry, no rashes   Neuro: Alert and oriented to  person, place, time, and situation, CNII-XII grossly intact, no focal deficits  Psych: Normal mood and affect   Labs    Chemistry Recent Labs  Lab 06/10/24 0247 06/11/24 0239 06/12/24 0214  NA 137 136 131*  K 3.8 3.9 4.0  CL 105 105 101  CO2 23 22 20*  GLUCOSE 111* 106* 111*  BUN 13 12 12   CREATININE 1.21 1.34* 1.20  CALCIUM  8.9 8.6* 8.4*  GFRNONAA >60 53* >60  ANIONGAP 9 9 10      Hematology Recent Labs  Lab 06/11/24 0239 06/12/24 0214 06/13/24 0404  WBC 5.5 4.8 5.3  RBC 3.66* 3.69* 3.44*  HGB 10.3* 10.4* 9.7*  HCT 31.8* 32.2* 30.3*  MCV 86.9 87.3 88.1  MCH 28.1 28.2 28.2  MCHC 32.4 32.3 32.0  RDW 14.0 13.9 13.8  PLT 171 178 174    Cardiac EnzymesNo results for input(s): TROPONINI in the last 168 hours. No results for input(s): TROPIPOC in the last 168 hours.   BNPNo results for input(s): BNP, PROBNP in the last 168 hours.   DDimer No results for input(s): DDIMER in the last 168 hours.   Radiology    CARDIAC CATHETERIZATION Result Date: 06/11/2024 Images from the original result were not  included.   Lesion #1 mid LAD lesion is 90% stenosed.   A drug-eluting stent was successfully placed using a STENT SYNERGY XD 2.50X16 deployed to 2.75 mm. Post intervention, there is a 0% residual stenosis.  TIMI-3 flow maintained.   Lesion #2 1st Diag lesion is 90% stenosed.  TIMI-3 flow   A drug-eluting stent was successfully placed using a STENT SYNERGY XD 2.25X12 => deployed to 2.4 mm.. Post intervention, there is a 0% residual stenosis.  TIMI-3 flow maintained   Lesion #3 mid Cx lesion is 99% stenosed.   Balloon angioplasty was performed using a BALLOON EMERGE MR PUSH 1.5X15. Post intervention, there is a 50% residual stenosis.  TIMI-3 flow maintained   Lesion #4 1st Mrg lesion is 99% stenosed.   Balloon angioplasty was attempted using a BALLOON EMERGE MR PUSH 1.5X8, however were not able to cross the lesion.  Post intervention, there is a 99% residual stenosis. . Was  not able to advance a second wire across the lesion.   Dist Cx lesion is 100% stenosed beyond 2nd Mrg.   --------------------------------------------------------------------------- --------------------   Anticipated discharge date to be determined.   Continue to titrate GDMT Monitor for symptoms, could consider reattempt at PCI of the OM using a different guide catheter and perhaps a more robust wire.   Recommend uninterrupted dual antiplatelet therapy with Aspirin  81mg  daily and Clopidogrel 75mg  daily for a minimum of 12 months (ACS-Class I recommendation).   Would continue Plavix monotherapy after the first year but okay to interrupt. Diagnostic Dominance: Right      Intervention Successful DES PCI to the mid LAD with a 2.5 mm x 16 mm Synergy XD deployed a 2.75 mm reduced into the lesion to 0% Successful direct stent DES PCI of diagonal branch with Synergy XD 2.25 mm x 12 mm deployed to 2.4 mm. PTCA only of mid LCx into OM1 wrist reducing lesion to 50% Unsuccessful PTCA/PCI of OM1-unable to cross lesion with a 1 5 balloon no C due to tortuosity and the actual location of the lesion. Was not able to advance a second wire across the lesion. RECOMMENDATIONS   Anticipated discharge date to be determined.   Continue to titrate GDMT Monitor for symptoms, could consider reattempt at PCI of the OM using a different guide catheter and perhaps a more robust wire.   Recommend uninterrupted dual antiplatelet therapy with Aspirin  81mg  daily and Clopidogrel 75mg  daily for a minimum of 12 months (ACS-Class I recommendation).   Would continue Plavix monotherapy after the first year but okay to interrupt. Alm Clay, MD   Cardiac Studies  Echocardiogram , Cardiac catheterization    Patient Profile     83 y.o. male with presented with NSTEMI, note multivessel CAD , hyperlipidemia and elevated Lpa  Assessment & Plan    NSTEMI Severe multivessel CAD  Elevated Lpa  Hypertension   He is status post PCI to diagonal  and LCX. Doing well post procedures. Will be on DAPT ideally 12 months with Aspirin  81 mg daily and Plavix 75 mg daily. Continue current dose of atorvastatin .   Blood pressure much more improved today  He is still complaining of the wrist and joint pain - no tender at the radial puncture site - will get arterial ultrasound to rule out pseudoaneurysm.  He is pending discharge to SNF  For questions or updates, please contact [facility] Please consult [email or web address] for contact info under Cardiology/STEMI.     Signed, Arah Aro, DO  06/13/2024, 9:09  AM

## 2024-06-13 NOTE — Plan of Care (Signed)
  Problem: Activity: Goal: Ability to tolerate increased activity will improve Outcome: Progressing   Problem: Cardiac: Goal: Ability to achieve and maintain adequate cardiovascular perfusion will improve Outcome: Progressing   Problem: Health Behavior/Discharge Planning: Goal: Ability to safely manage health-related needs after discharge will improve Outcome: Progressing   

## 2024-06-13 NOTE — NC FL2 (Signed)
 [place]  MEDICAID FL2 LEVEL OF CARE FORM     IDENTIFICATION  Patient Name: Luis Poole Birthdate: January 22, 1941 Sex: male Admission Date (Current Location): 06/07/2024  [facility] and [place] Number:  Producer, Television/film/video and Address:  [facility], [address], [place], [place] [address]      Provider Number: [id number]  Attending Physician Name and Address:  Drusilla Sabas RAMAN, MD  Relative Name and Phone Number:  Lajuanda Carol (daughter) [phone], Chiquita Poet (sister) [phone]    Current Level of Care: Hospital Recommended Level of Care: Skilled Nursing Facility Prior Approval Number:    Date Approved/Denied:   PASRR Number: [id number]  Discharge Plan: SNF    Current Diagnoses: Patient Active Problem List   Diagnosis Date Noted   Chest pain 06/11/2024   Chronic pain 06/08/2024   Debility 12/25/2023   Lightheadedness 12/24/2023   Coronary artery disease involving native coronary artery of native heart without angina pectoris 08/12/2023   Pressure injury of skin 08/12/2023   Hypokalemia 08/12/2023   AKI (acute kidney injury) 08/11/2023   Near syncope 11/26/2022   Syncope 10/06/2017   NSTEMI (non-ST elevated myocardial infarction) (HCC) 10/06/2017   Low back pain 06/28/2017   Leg swelling 08/23/2013   Left leg swelling 08/23/2013   Neuropathy 08/23/2013   Acute gout 08/23/2013   D-dimer, elevated 08/23/2013   CKD (chronic kidney disease), stage III (HCC) 08/23/2013   Chronic diastolic heart failure (HCC) 08/23/2013   Normocytic anemia 06/08/2013   Hypertension 08/29/2012   Diabetes mellitus type 2 in nonobese (HCC) 08/29/2012   History of CVA (cerebrovascular accident) 08/28/2012   L-S radiculopathy 08/28/2012   Lumbar post-laminectomy syndrome 01/15/2012   Thoracic or lumbosacral neuritis or radiculitis, unspecified 01/15/2012   Left hemiparesis (HCC) 01/15/2012   Diabetic peripheral neuropathy (HCC) 01/15/2012     Orientation RESPIRATION BLADDER Height & Weight     Self, Time, Situation, Place  Normal Continent Weight: 183 lb 13.8 oz (83.4 kg) Height:  5' 7 (170.2 cm)  BEHAVIORAL SYMPTOMS/MOOD NEUROLOGICAL BOWEL NUTRITION STATUS      Incontinent Diet (Please see discharge summary)  AMBULATORY STATUS COMMUNICATION OF NEEDS Skin   Extensive Assist Verbally Other (Comment) (WDL,scab on back, Wound/Incision LDAs)                       Personal Care Assistance Level of Assistance  Bathing, Feeding, Dressing Bathing Assistance: Maximum assistance Feeding assistance: Independent Dressing Assistance: Maximum assistance     Functional Limitations Info  Sight, Hearing, Speech Sight Info:  Financial Trader) Hearing Info: Adequate Speech Info: Adequate    SPECIAL CARE FACTORS FREQUENCY  PT (By licensed PT), OT (By licensed OT)     PT Frequency: 5x min weekly OT Frequency: 5x min weekly            Contractures Contractures Info: Not present    Additional Factors Info  Code Status, Allergies Code Status Info: FULL Allergies Info: Ibuprofen ,Lisinopril,Pregabalin ,Gabapentin            Current Medications (06/13/2024):  This is the current hospital active medication list Current Facility-Administered Medications  Medication Dose Route Frequency Provider Last Rate Last Admin   acetaminophen  (TYLENOL ) tablet 650 mg  650 mg Oral Q4H PRN Anner Alm ORN, MD       allopurinol  (ZYLOPRIM ) tablet 100 mg  100 mg Oral Daily PRN Anner Alm ORN, MD   100 mg at 06/10/24 0933   amLODipine  (NORVASC ) tablet 5 mg  5 mg Oral Daily Anner Alm ORN, MD   5 mg at 06/13/24 0850   aspirin  EC tablet 81 mg  81 mg Oral Daily Anner Alm ORN, MD   81 mg at 06/13/24 9148   atorvastatin  (LIPITOR) tablet 80 mg  80 mg Oral Daily Anner Alm ORN, MD   80 mg at 06/13/24 0851   clopidogrel (PLAVIX) tablet 75 mg  75 mg Oral Q breakfast Anner Alm ORN, MD   75 mg at 06/13/24 0851   [START ON 06/14/2024]  colchicine  tablet 0.6 mg  0.6 mg Oral Daily Drusilla Sabas RAMAN, MD       cyclobenzaprine  (FLEXERIL ) tablet 5 mg  5 mg Oral BID PRN Anner Alm ORN, MD       free water 500 mL  500 mL Oral Once Anner Alm ORN, MD       irbesartan  (AVAPRO ) tablet 300 mg  300 mg Oral Daily Anner Alm ORN, MD   300 mg at 06/13/24 9149   lidocaine  (LIDODERM ) 5 % 1 patch  1 patch Transdermal Q24H Anner Alm ORN, MD   1 patch at 06/13/24 9146   nitroGLYCERIN (NITROSTAT) SL tablet 0.4 mg  0.4 mg Sublingual Q5 Min x 3 PRN Anner Alm ORN, MD       ondansetron  (ZOFRAN ) injection 4 mg  4 mg Intravenous Q6H PRN Anner Alm ORN, MD   4 mg at 06/11/24 1521   oxyCODONE  (Oxy IR/ROXICODONE ) immediate release tablet 5 mg  5 mg Oral Q4H PRN Anner Alm ORN, MD   5 mg at 06/12/24 0908   pantoprazole  (PROTONIX ) EC tablet 40 mg  40 mg Oral Daily Anner Alm ORN, MD   40 mg at 06/13/24 9146   polyethylene glycol (MIRALAX  / GLYCOLAX ) packet 17 g  17 g Oral Daily PRN Anner Alm ORN, MD   17 g at 06/09/24 9389   sodium chloride  flush (NS) 0.9 % injection 3 mL  3 mL Intravenous Q12H Anner Alm ORN, MD   3 mL at 06/12/24 9090   sodium chloride  flush (NS) 0.9 % injection 3 mL  3 mL Intravenous PRN Anner Alm ORN, MD       sodium chloride  flush (NS) 0.9 % injection 3 mL  3 mL Intravenous Q12H Anner Alm ORN, MD   3 mL at 06/13/24 9146   sodium chloride  flush (NS) 0.9 % injection 3 mL  3 mL Intravenous PRN Anner Alm ORN, MD         Discharge Medications: Please see discharge summary for a list of discharge medications.  Relevant Imaging Results:  Relevant Lab Results:   Additional Information [id number]  [name], LCSWA
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
## 2024-06-14 ENCOUNTER — Encounter (HOSPITAL_COMMUNITY): Payer: Self-pay | Admitting: Cardiology

## 2024-06-14 DIAGNOSIS — R079 Chest pain, unspecified: Secondary | ICD-10-CM | POA: Diagnosis not present

## 2024-06-14 DIAGNOSIS — Z8673 Personal history of transient ischemic attack (TIA), and cerebral infarction without residual deficits: Secondary | ICD-10-CM | POA: Diagnosis not present

## 2024-06-14 DIAGNOSIS — R42 Dizziness and giddiness: Secondary | ICD-10-CM

## 2024-06-14 DIAGNOSIS — I214 Non-ST elevation (NSTEMI) myocardial infarction: Secondary | ICD-10-CM | POA: Diagnosis not present

## 2024-06-14 LAB — CBC
HCT: 28.4 % — ABNORMAL LOW (ref 39.0–52.0)
Hemoglobin: 9.2 g/dL — ABNORMAL LOW (ref 13.0–17.0)
MCH: 28.5 pg (ref 26.0–34.0)
MCHC: 32.4 g/dL (ref 30.0–36.0)
MCV: 87.9 fL (ref 80.0–100.0)
Platelets: 164 K/uL (ref 150–400)
RBC: 3.23 MIL/uL — ABNORMAL LOW (ref 4.22–5.81)
RDW: 13.6 % (ref 11.5–15.5)
WBC: 4.8 K/uL (ref 4.0–10.5)
nRBC: 0 % (ref 0.0–0.2)

## 2024-06-14 MED FILL — Nitroglycerin IV Soln 100 MCG/ML in D5W: INTRA_ARTERIAL | Qty: 10 | Status: AC

## 2024-06-14 MED FILL — Norepinephrine-NaCl IV Solution 4 MG/250ML-0.9%: INTRAVENOUS | Qty: 250 | Status: AC

## 2024-06-14 NOTE — Plan of Care (Signed)
  Problem: Activity: Goal: Ability to tolerate increased activity will improve Outcome: Progressing   Problem: Clinical Measurements: Goal: Respiratory complications will improve Outcome: Progressing Goal: Cardiovascular complication will be avoided Outcome: Progressing   Problem: Activity: Goal: Risk for activity intolerance will decrease Outcome: Progressing   Problem: Coping: Goal: Level of anxiety will decrease Outcome: Progressing   Problem: Pain Managment: Goal: General experience of comfort will improve and/or be controlled Outcome: Progressing   Problem: Safety: Goal: Ability to remain free from injury will improve Outcome: Progressing

## 2024-06-14 NOTE — TOC Progression Note (Signed)
 Transition of Care [facility]) - Progression Note    Patient Details  Name: Luis Poole MRN: [id number] Date of Birth: March 08, 1941  [facility]) CM/SW Contact  Lauraine FORBES Saa, LCSWA Phone Number: 06/14/2024, 3:46 PM  Clinical Narrative:     3:46 PM CSW introduced self and role to patient. CSW provided patient with current SNF ([facility], [facility], [facility], [facility]) offers and SNF quarterly Medicare ratings. CSW will continue to follow.  Expected Discharge Plan: Skilled Nursing Facility Barriers to Discharge: Continued Medical Work up               Expected Discharge Plan and Services In-house Referral: Clinical Social Work     Living arrangements for the past 2 months: Single Family Home Expected Discharge Date: 06/12/24                                     Social Drivers of Health (SDOH) Interventions SDOH Screenings   Food Insecurity: No Food Insecurity (06/08/2024)  Housing: Low Risk  (06/08/2024)  Transportation Needs: No Transportation Needs (06/08/2024)  Utilities: Not At Risk (06/08/2024)  Financial Resource Strain: Low Risk  (09/25/2023)   Received from [facility]  Physical Activity: Unknown (06/26/2023)   Received from [facility]  Social Connections: Moderately Isolated (06/08/2024)  Stress: Stress Concern Present (06/26/2023)   Received from [facility]  Tobacco Use: Medium Risk (06/08/2024)    Readmission Risk Interventions     No data to display

## 2024-06-14 NOTE — Evaluation (Signed)
 Occupational Therapy Evaluation Patient Details Name: Luis Poole MRN: [id number] DOB: [birth year] Today's Date: 06/14/2024   History of Present Illness   Pt is an 83 y/o male who presents 06/07/2024 from home with chest pain and dizziness. Troponin was elevated and found to have NSTEMI, now s/p PCI on 10/24. PMH significant for CHF, CKD, DM, L foot drop with AFO, HTN, PAD, lumbar fusion, CVA.     Clinical Impressions Pt reports having PRN assist from son at home, lives with son and typically uses w/c to mobilize, has assist for ADLs and sponge bathes at baseline. Pt currently needs up to max A for ADLs, mod A for bed mobility and mod +2 for transfers with RW. Pt with improved mobility with shoes donned, though still with RUE/wrist pain from heart cath,also with decr FMC and strength. Pt presenting with impairments listed below, will follow acutely. Patient will benefit from continued inpatient follow up therapy, <3 hours/day to maximize safety/ind with ADL/functional mobility.      If plan is discharge home, recommend the following:   Two people to help with walking and/or transfers;A lot of help with bathing/dressing/bathroom;Assistance with cooking/housework;Direct supervision/assist for medications management;Direct supervision/assist for financial management;Assist for transportation;Help with stairs or ramp for entrance     Functional Status Assessment   Patient has had a recent decline in their functional status and demonstrates the ability to make significant improvements in function in a reasonable and predictable amount of time.     Equipment Recommendations   Other (comment) (defer)     Recommendations for Other Services   PT consult     Precautions/Restrictions   Precautions Precautions: Fall Recall of Precautions/Restrictions: Intact Precaution/Restrictions Comments: LLE AFO for drop foot but not present at time of eval Restrictions Weight Bearing  Restrictions Per Provider Order: No     Mobility Bed Mobility Overal bed mobility: Needs Assistance Bed Mobility: Supine to Sit, Sit to Supine     Supine to sit: Mod assist          Transfers Overall transfer level: Needs assistance Equipment used: Rolling walker (2 wheels), 1 person hand held assist Transfers: Sit to/from Stand, Bed to chair/wheelchair/BSC Sit to Stand: Mod assist, +2 physical assistance Stand pivot transfers: Mod assist, +2 physical assistance                Balance Overall balance assessment: Needs assistance Sitting-balance support: Feet supported, Single extremity supported Sitting balance-Leahy Scale: Fair     Standing balance support: Reliant on assistive device for balance, During functional activity Standing balance-Leahy Scale: Poor Standing balance comment: reliant on external support                           ADL either performed or assessed with clinical judgement   ADL Overall ADL's : Needs assistance/impaired Eating/Feeding: Sitting;Minimal assistance   Grooming: Minimal assistance;Sitting   Upper Body Bathing: Moderate assistance;Sitting   Lower Body Bathing: Maximal assistance;Sitting/lateral leans;Sit to/from stand   Upper Body Dressing : Moderate assistance;Sitting   Lower Body Dressing: Maximal assistance;Sit to/from stand;Sitting/lateral leans   Toilet Transfer: Moderate assistance;+2 for physical assistance   Toileting- Clothing Manipulation and Hygiene: Maximal assistance       Functional mobility during ADLs: Moderate assistance;+2 for physical assistance;Rolling walker (2 wheels)       Vision Baseline Vision/History: 1 Wears glasses Vision Assessment?: No apparent visual deficits     Perception Perception: Not tested  Praxis Praxis: Not tested       Pertinent Vitals/Pain Pain Assessment Pain Assessment: Faces Pain Score: 4  Faces Pain Scale: Hurts little more Pain Location:  bottom Pain Descriptors / Indicators: Discomfort Pain Intervention(s): Limited activity within patient's tolerance, Monitored during session, Repositioned     Extremity/Trunk Assessment Upper Extremity Assessment Upper Extremity Assessment: RUE deficits/detail;Left hand dominant RUE Deficits / Details: incr pain, 2+/5 grasp, denies sensation changes, significant pain with attempts at wrist ROM, able to grasp bedrail to sit up RUE: Unable to fully assess due to pain RUE Coordination: decreased fine motor;decreased gross motor   Lower Extremity Assessment Lower Extremity Assessment: Defer to PT evaluation   Cervical / Trunk Assessment Cervical / Trunk Exceptions: Spinal cord stimulator present.   Communication Communication Communication: No apparent difficulties   Cognition Arousal: Alert Behavior During Therapy: WFL for tasks assessed/performed, Flat affect                                 Following commands: Intact       Cueing  General Comments   Cueing Techniques: Verbal cues;Gestural cues  VSS on RA   Exercises     Shoulder Instructions      Home Living Family/patient expects to be discharged to:: Private residence Living Arrangements: Children Available Help at Discharge: Family;Available PRN/intermittently Type of Home: House Home Access: Ramped entrance     Home Layout: One level     Bathroom Shower/Tub: Producer, Television/film/video: Standard     Home Equipment: Rollator (4 wheels);Shower seat - built in;Grab bars - tub/shower;Wheelchair - manual;Toilet riser;Grab bars - toilet;Rolling Environmental Consultant (2 wheels);Hospital bed   Additional Comments: has LLE AFO, not present in room but does well with shoes      Prior Functioning/Environment               Mobility Comments: w/c with gait belt around it, short distances with RW ADLs Comments: Gets in the shower occasionally (a couple times a month) and sponge bathes every other day.     OT Problem List: Decreased strength;Decreased range of motion;Decreased activity tolerance;Impaired balance (sitting and/or standing);Decreased coordination;Decreased cognition;Decreased safety awareness   OT Treatment/Interventions: Self-care/ADL training;Therapeutic exercise;Energy conservation;DME and/or AE instruction;Therapeutic activities;Patient/family education;Balance training      OT Goals(Current goals can be found in the care plan section)   Acute Rehab OT Goals Patient Stated Goal: none stated OT Goal Formulation: With patient Time For Goal Achievement: 06/28/24 Potential to Achieve Goals: Good ADL Goals Pt Will Perform Grooming: Independently;sitting Pt Will Perform Upper Body Dressing: with min assist;sitting Pt Will Perform Lower Body Dressing: with min assist;sit to/from stand;sitting/lateral leans Pt Will Transfer to Toilet: with min assist;ambulating;regular height toilet Pt/caregiver will Perform Home Exercise Program: Increased ROM;Increased strength;Right Upper extremity;With minimal assist;With written HEP provided   OT Frequency:  Min 2X/week    Co-evaluation              AM-PAC OT 6 Clicks Daily Activity     Outcome Measure Help from another person eating meals?: A Little Help from another person taking care of personal grooming?: A Little Help from another person toileting, which includes using toliet, bedpan, or urinal?: A Lot Help from another person bathing (including washing, rinsing, drying)?: A Lot Help from another person to put on and taking off regular upper body clothing?: A Lot Help from another person to put on and taking off  regular lower body clothing?: A Lot 6 Click Score: 14   End of Session Equipment Utilized During Treatment: Rolling walker (2 wheels);Gait belt Nurse Communication: Mobility status  Activity Tolerance: Patient tolerated treatment well Patient left: in chair;with call bell/phone within reach;with chair alarm  set  OT Visit Diagnosis: Unsteadiness on feet (R26.81);Other abnormalities of gait and mobility (R26.89);Muscle weakness (generalized) (M62.81)                Time: 9056-8997 OT Time Calculation (min): 19 min Charges:  OT General Charges $OT Visit: 1 Visit OT Evaluation $OT Eval Moderate Complexity: 1 Mod  Helaina Stefano K, OTD, OTR/L SecureChat Preferred Acute Rehab [phone]    Lorinda Copland K Koonce 06/14/2024, 10:15 AM
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
## 2024-06-14 NOTE — Progress Notes (Signed)
  Progress Note  Patient Name: Luis Poole Date of Encounter: 06/14/2024 [facility] Cardiologist: Soyla DELENA Merck, MD   Interval Summary   Some tenderness at right wrist, but no hematoma/pseudoaneurysm. No chest pain or dyspnea  Vital Signs Vitals:   06/13/24 2342 06/14/24 0257 06/14/24 0541 06/14/24 0738  BP: 102/70 117/67  (!) 140/76  Pulse: (!) 57 (!) 57  60  Resp: 16 16  17   Temp: 97.9 F (36.6 C) 98 F (36.7 C)  98 F (36.7 C)  TempSrc: Oral Oral  Oral  SpO2: 100% 99%  97%  Weight:   86 kg   Height:        Intake/Output Summary (Last 24 hours) at 06/14/2024 0853 Last data filed at 06/14/2024 0052 Gross per 24 hour  Intake --  Output 350 ml  Net -350 ml      06/14/2024    5:41 AM 06/13/2024    3:04 AM 06/12/2024    4:34 AM  Last 3 Weights  Weight (lbs) 189 lb 9.5 oz 183 lb 13.8 oz 184 lb 4.9 oz  Weight (kg) 86 kg 83.4 kg 83.6 kg      Telemetry/ECG  NSR- Personally Reviewed  Physical Exam  GEN: No acute distress.   Neck: No JVD Cardiac: RRR, no murmurs, rubs, or gallops.  No evidence of complications at the right radial access site. Respiratory: Clear to auscultation bilaterally. GI: Soft, nontender, non-distended  MS: No edema  Assessment & Plan  83 y.o. 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 Normal LV function.   [facility] will sign off.   Medication Recommendations:   Aspirin  81 mg daily Clopidogrel 75 mg daily Atorvastatin  80 mg daily Amlodipine  5 mg daily Irbesartan  300 mg daily Other recommendations (labs, testing, etc): Recheck a lipid profile in 3 months.   If LDL does not reach target less than 70, consider switching to PCSK9 inhibitor since he has markedly elevated LP(a). Follow up as an outpatient: 06/18/2024 with Orren Fabry, PA         Signed, Jerel Balding, MD

## 2024-06-14 NOTE — Care Management Important Message (Signed)
 Important Message  Patient Details  Name: Luis Poole MRN: [id number] Date of Birth: February 08, 1941   Important Message Given:  Yes - Medicare IM     Claretta Deed 06/14/2024, 1:15 PM

## 2024-06-15 DIAGNOSIS — R079 Chest pain, unspecified: Secondary | ICD-10-CM | POA: Diagnosis not present

## 2024-06-15 DIAGNOSIS — R42 Dizziness and giddiness: Secondary | ICD-10-CM | POA: Diagnosis not present

## 2024-06-15 DIAGNOSIS — I214 Non-ST elevation (NSTEMI) myocardial infarction: Secondary | ICD-10-CM | POA: Diagnosis not present

## 2024-06-15 DIAGNOSIS — Z8673 Personal history of transient ischemic attack (TIA), and cerebral infarction without residual deficits: Secondary | ICD-10-CM | POA: Diagnosis not present

## 2024-06-15 LAB — CBC
HCT: 32.7 % — ABNORMAL LOW (ref 39.0–52.0)
Hemoglobin: 10.5 g/dL — ABNORMAL LOW (ref 13.0–17.0)
MCH: 28.2 pg (ref 26.0–34.0)
MCHC: 32.1 g/dL (ref 30.0–36.0)
MCV: 87.7 fL (ref 80.0–100.0)
Platelets: 198 K/uL (ref 150–400)
RBC: 3.73 MIL/uL — ABNORMAL LOW (ref 4.22–5.81)
RDW: 13.6 % (ref 11.5–15.5)
WBC: 4.4 K/uL (ref 4.0–10.5)
nRBC: 0 % (ref 0.0–0.2)

## 2024-06-15 LAB — BASIC METABOLIC PANEL WITH GFR
Anion gap: 7 (ref 5–15)
BUN: 21 mg/dL (ref 8–23)
CO2: 23 mmol/L (ref 22–32)
Calcium: 8.6 mg/dL — ABNORMAL LOW (ref 8.9–10.3)
Chloride: 105 mmol/L (ref 98–111)
Creatinine, Ser: 1.46 mg/dL — ABNORMAL HIGH (ref 0.61–1.24)
GFR, Estimated: 48 mL/min — ABNORMAL LOW (ref >60)
Glucose, Bld: 112 mg/dL — ABNORMAL HIGH (ref 70–99)
Potassium: 4 mmol/L (ref 3.5–5.1)
Sodium: 135 mmol/L (ref 135–145)

## 2024-06-15 MED ORDER — ALUM & MAG HYDROXIDE-SIMETH 200-200-20 MG/5ML PO SUSP
15.0000 mL | ORAL | Status: DC | PRN
  Administered 2024-06-15: 15 mL via ORAL
  Filled 2024-06-15: qty 30

## 2024-06-15 MED ORDER — LOPERAMIDE HCL 2 MG PO CAPS
2.0000 mg | ORAL_CAPSULE | ORAL | Status: DC | PRN
  Administered 2024-06-15: 2 mg via ORAL
  Filled 2024-06-15 (×2): qty 1
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
## 2024-06-15 NOTE — TOC Progression Note (Addendum)
 Transition of Care [facility]) - Progression Note    Patient Details  Name: Luis Poole MRN: [id number] Date of Birth: 05/01/1941  [facility] CM/SW Contact  Lauraine FORBES Saa, LCSWA Phone Number: 06/15/2024, 12:45 PM  Clinical Narrative:     12:45 PM CSW provided patient with additional SNF bed offer [facility]) and inquired about SNF decision. Patient accepted bed offer at [facility]. CSW made SNF aware. CSW to submit SNF insurance authorization upon updated PT note. CSW will continue to follow.  2:36 PM CSW submitted SNF insurance authorization request which has been approved and is valid 06/16/2024-06/18/2024. CSW will continue to follow.  Expected Discharge Plan: Skilled Nursing Facility Barriers to Discharge: Continued Medical Work up               Expected Discharge Plan and Services In-house Referral: Clinical Social Work     Living arrangements for the past 2 months: Single Family Home Expected Discharge Date: 06/12/24                                     Social Drivers of Health (SDOH) Interventions SDOH Screenings   Food Insecurity: No Food Insecurity (06/08/2024)  Housing: Low Risk  (06/08/2024)  Transportation Needs: No Transportation Needs (06/08/2024)  Utilities: Not At Risk (06/08/2024)  Financial Resource Strain: Low Risk  (09/25/2023)   Received from [facility]  Physical Activity: Unknown (06/26/2023)   Received from [facility]  Social Connections: Moderately Isolated (06/08/2024)  Stress: Stress Concern Present (06/26/2023)   Received from [facility]  Tobacco Use: Medium Risk (06/08/2024)    Readmission Risk Interventions     No data to display

## 2024-06-15 NOTE — Progress Notes (Signed)
 Physical Therapy Treatment  Patient Details Name: Luis Poole MRN: [id number] DOB: [birth year] Today's Date: 06/15/2024   History of Present Illness Pt is an 83 y/o male who presents 06/07/2024 from home with chest pain and dizziness. Troponin was elevated and found to have NSTEMI, now s/p PCI on 10/24. PMH significant for CHF, CKD, DM, L foot drop with AFO, HTN, PAD, lumbar fusion, CVA.    PT Comments  Pt progressing towards physical therapy goals. Was able to perform transfers with up to +2 min assist for balance support and safety. Pt reports he sat up earlier in the day but was motivated to get up again to chair during PT session. Acute PT recommendations remain appropriate and unchanged. Will continue to follow.     If plan is discharge home, recommend the following: Two people to help with walking and/or transfers;Two people to help with bathing/dressing/bathroom;Assistance with cooking/housework;Assist for transportation;Help with stairs or ramp for entrance   Can travel by private vehicle     No  Equipment Recommendations  None recommended by PT    Recommendations for Other Services OT consult     Precautions / Restrictions Precautions Precautions: Fall Recall of Precautions/Restrictions: Intact Precaution/Restrictions Comments: Brace for L foot Restrictions Weight Bearing Restrictions Per Provider Order: No     Mobility  Bed Mobility Overal bed mobility: Needs Assistance Bed Mobility: Supine to Sit     Supine to sit: Min assist     General bed mobility comments: Increased time and effort to transition to EOB. Railing use required and assist to scoot out fully to get feet on the floor.    Transfers Overall transfer level: Needs assistance Equipment used: Rolling walker (2 wheels) Transfers: Sit to/from Stand, Bed to chair/wheelchair/BSC Sit to Stand: Min assist, +2 physical assistance, From elevated surface   Step pivot transfers: Min assist, +2 physical  assistance, +2 safety/equipment, From elevated surface       General transfer comment: VC's for hand placement on seated surface for safety. +2 for power up to full stand. Noted pt able to keep L foot on the ground easier this session. Pt was able to take a few pivotal steps around to the chair with assist for balance support and walker management. Pt with significantly flexed trunk and unable to make corrective changes with cues.    Ambulation/Gait               General Gait Details: Did not progress to gait training at this time.   Stairs             Wheelchair Mobility     Tilt Bed    Modified Rankin (Stroke Patients Only)       Balance Overall balance assessment: Needs assistance Sitting-balance support: Feet supported, Single extremity supported Sitting balance-Leahy Scale: Fair     Standing balance support: Reliant on assistive device for balance, During functional activity Standing balance-Leahy Scale: Poor Standing balance comment: reliant on external support                            Communication Communication Communication: No apparent difficulties  Cognition Arousal: Alert Behavior During Therapy: WFL for tasks assessed/performed   PT - Cognitive impairments: No apparent impairments                         Following commands: Intact      Cueing Cueing Techniques: Verbal cues,  Gestural cues  Exercises General Exercises - Lower Extremity Long Arc Quad: 10 reps Hip ABduction/ADduction: 10 reps (isometric adduction)    General Comments        Pertinent Vitals/Pain Pain Assessment Pain Assessment: 0-10 Pain Score: 7  Pain Location: back Pain Descriptors / Indicators: Sharp Pain Intervention(s): Limited activity within patient's tolerance, Monitored during session, Repositioned    Home Living                          Prior Function            PT Goals (current goals can now be found in the care  plan section) Acute Rehab PT Goals Patient Stated Goal: Be able to return home PT Goal Formulation: With patient Time For Goal Achievement: 06/26/24 Potential to Achieve Goals: Good Progress towards PT goals: Progressing toward goals    Frequency    Min 2X/week      PT Plan      Co-evaluation              AM-PAC PT 6 Clicks Mobility   Outcome Measure  Help needed turning from your back to your side while in a flat bed without using bedrails?: A Little Help needed moving from lying on your back to sitting on the side of a flat bed without using bedrails?: A Little Help needed moving to and from a bed to a chair (including a wheelchair)?: A Lot Help needed standing up from a chair using your arms (e.g., wheelchair or bedside chair)?: A Lot Help needed to walk in hospital room?: Total Help needed climbing 3-5 steps with a railing? : Total 6 Click Score: 12    End of Session Equipment Utilized During Treatment: Gait belt Activity Tolerance: Patient limited by fatigue;Patient limited by pain Patient left: in chair;with call bell/phone within reach;with chair alarm set Nurse Communication: Mobility status PT Visit Diagnosis: Muscle weakness (generalized) (M62.81);Difficulty in walking, not elsewhere classified (R26.2);Pain Pain - part of body:  (back)     Time: 8652-8585 PT Time Calculation (min) (ACUTE ONLY): 27 min  Charges:    $Gait Training: 23-37 mins PT General Charges $$ ACUTE PT VISIT: 1 Visit                     Leita Sable, PT, DPT Acute Rehabilitation Services Secure Chat Preferred Office: [phone]    Leita JONETTA Sable 06/15/2024, 2:21 PM

## 2024-06-15 NOTE — Plan of Care (Signed)
  Problem: Activity: Goal: Ability to tolerate increased activity will improve Outcome: Progressing   Problem: Health Behavior/Discharge Planning: Goal: Ability to safely manage health-related needs after discharge will improve Outcome: Progressing   Problem: Education: Goal: Knowledge of General Education information will improve Description: Including pain rating scale, medication(s)/side effects and non-pharmacologic comfort measures Outcome: Progressing   Problem: Pain Managment: Goal: General experience of comfort will improve and/or be controlled Outcome: Progressing   Problem: Safety: Goal: Ability to remain free from injury will improve Outcome: Progressing

## 2024-06-15 NOTE — Progress Notes (Signed)
@   1941 pt had an 11-beat-run of V-tach. Resting comfortably no complaints of CP or shortness of breath. Attempted to notify [place], MD.  Lonell LITTIE Lyme, RN

## 2024-06-15 NOTE — Progress Notes (Signed)
 Mobility Specialist Progress Note;   06/15/24 0923  Mobility  Activity Pivoted/transferred from bed to chair  Level of Assistance Moderate assist, patient does 50-74%  Assistive Device Front wheel walker  Distance Ambulated (ft) 3 ft  Activity Response Tolerated well  Mobility Referral Yes  Mobility visit 1 Mobility  Mobility Specialist Start Time (ACUTE ONLY) [id number]  Mobility Specialist Stop Time (ACUTE ONLY) 0935  Mobility Specialist Time Calculation (min) (ACUTE ONLY) 12 min   Pt agreeable to mobility. Bed linens soiled w/ BM requiring linen change and pericare. Required MinA for bed mobility, ModA+2 to stand and take a few steps to chair. VC required for foot progression as pt was having a difficult time lifting feet off of ground. VSS throughout and no c/o this session. Pt left in chair with all needs met, call bell in reach. RN notified.   Lauraine Erm Mobility Specialist Please contact via SecureChat or [employer] [phone]

## 2024-06-16 DIAGNOSIS — I10 Essential (primary) hypertension: Secondary | ICD-10-CM | POA: Diagnosis not present

## 2024-06-16 DIAGNOSIS — I214 Non-ST elevation (NSTEMI) myocardial infarction: Secondary | ICD-10-CM | POA: Diagnosis not present

## 2024-06-16 DIAGNOSIS — I5032 Chronic diastolic (congestive) heart failure: Secondary | ICD-10-CM | POA: Diagnosis not present

## 2024-06-16 DIAGNOSIS — G8929 Other chronic pain: Secondary | ICD-10-CM | POA: Diagnosis not present

## 2024-06-16 LAB — CBC
HCT: 29.7 % — ABNORMAL LOW (ref 39.0–52.0)
Hemoglobin: 9.6 g/dL — ABNORMAL LOW (ref 13.0–17.0)
MCH: 28.3 pg (ref 26.0–34.0)
MCHC: 32.3 g/dL (ref 30.0–36.0)
MCV: 87.6 fL (ref 80.0–100.0)
Platelets: 193 K/uL (ref 150–400)
RBC: 3.39 MIL/uL — ABNORMAL LOW (ref 4.22–5.81)
RDW: 13.3 % (ref 11.5–15.5)
WBC: 4.3 K/uL (ref 4.0–10.5)
nRBC: 0 % (ref 0.0–0.2)

## 2024-06-16 LAB — BASIC METABOLIC PANEL WITH GFR
Anion gap: 7 (ref 5–15)
BUN: 19 mg/dL (ref 8–23)
CO2: 21 mmol/L — ABNORMAL LOW (ref 22–32)
Calcium: 8.6 mg/dL — ABNORMAL LOW (ref 8.9–10.3)
Chloride: 107 mmol/L (ref 98–111)
Creatinine, Ser: 1.29 mg/dL — ABNORMAL HIGH (ref 0.61–1.24)
GFR, Estimated: 56 mL/min — ABNORMAL LOW (ref >60)
Glucose, Bld: 108 mg/dL — ABNORMAL HIGH (ref 70–99)
Potassium: 4.1 mmol/L (ref 3.5–5.1)
Sodium: 135 mmol/L (ref 135–145)

## 2024-06-16 LAB — MAGNESIUM: Magnesium: 2.3 mg/dL (ref 1.7–2.4)

## 2024-06-16 MED ORDER — ENOXAPARIN SODIUM 40 MG/0.4ML IJ SOSY
40.0000 mg | PREFILLED_SYRINGE | INTRAMUSCULAR | Status: DC
  Administered 2024-06-16 – 2024-06-17 (×2): 40 mg via SUBCUTANEOUS
  Filled 2024-06-16 (×2): qty 0.4

## 2024-06-16 MED ORDER — CALCIUM POLYCARBOPHIL 625 MG PO TABS
1250.0000 mg | ORAL_TABLET | Freq: Every day | ORAL | Status: DC
  Administered 2024-06-16 – 2024-06-18 (×3): 1250 mg via ORAL
  Filled 2024-06-16 (×3): qty 2

## 2024-06-16 NOTE — TOC Progression Note (Addendum)
 Transition of Care [facility]) - Progression Note    Patient Details  Name: Luis Poole MRN: [id number] Date of Birth: 01-02-1941  Transition of [facility]) CM/SW Contact  Lauraine FORBES Saa, LCSWA Phone Number: 06/16/2024, 9:19 AM  Clinical Narrative:     9:20 AM [facility] informed CSW that SNF does not currently have bed availability and do not know when next bed availability will be. CSW will continue to follow.  12:00 PM MD informed CSW that patient is medically ready for discharge. CSW relayed information to patient and inquired about other SNF options to discharge to. Patient expressed preference in waiting for bed at [facility] SNF vs discharging to other provided SNF options. CSW will continue to follow.  Expected Discharge Plan: Skilled Nursing Facility Barriers to Discharge: Continued Medical Work up               Expected Discharge Plan and Services In-house Referral: Clinical Social Work     Living arrangements for the past 2 months: Single Family Home Expected Discharge Date: 06/12/24                                     Social Drivers of Health (SDOH) Interventions SDOH Screenings   Food Insecurity: No Food Insecurity (06/08/2024)  Housing: Low Risk  (06/08/2024)  Transportation Needs: No Transportation Needs (06/08/2024)  Utilities: Not At Risk (06/08/2024)  Financial Resource Strain: Low Risk  (09/25/2023)   Received from [facility]  Physical Activity: Unknown (06/26/2023)   Received from [facility]  Social Connections: Moderately Isolated (06/08/2024)  Stress: Stress Concern Present (06/26/2023)   Received from [facility]  Tobacco Use: Medium Risk (06/08/2024)    Readmission Risk Interventions     No data to display

## 2024-06-16 NOTE — Progress Notes (Signed)
 Occupational Therapy Treatment Patient Details Name: Luis Poole MRN: [id number] DOB: 07-06-1941 Today's Date: 06/16/2024   History of present illness Pt is an 83 y/o male who presents 06/07/2024 from home with chest pain and dizziness. Troponin was elevated and found to have NSTEMI, now s/p PCI on 10/24. PMH significant for CHF, CKD, DM, L foot drop with AFO, HTN, PAD, lumbar fusion, CVA.   OT comments  Pt progressing toward goals this session, needs up to mod A for bed mobility and up to max A for ADLs. Pt needing mod-max A for standing from EOB, needs bed elevated, min A to step pivot to chair, and then able to take 4-5 steps forward with close chair follow. Pt keeping trunk flexed, incr cues to remain upright. Pt with improving RUE function, able to grasp RW and reports pain is better than prior session. Pt presenting with impairments listed below, will follow acutely. Patient will benefit from continued inpatient follow up therapy, <3 hours/day to maximize safety/ind with ADL/functional mobility.       If plan is discharge home, recommend the following:  Two people to help with walking and/or transfers;A lot of help with bathing/dressing/bathroom;Assistance with cooking/housework;Direct supervision/assist for medications management;Direct supervision/assist for financial management;Assist for transportation;Help with stairs or ramp for entrance   Equipment Recommendations  Other (comment) (defer)    Recommendations for Other Services PT consult    Precautions / Restrictions Precautions Precautions: Fall Recall of Precautions/Restrictions: Intact Precaution/Restrictions Comments: Brace for L foot Restrictions Weight Bearing Restrictions Per Provider Order: No       Mobility Bed Mobility Overal bed mobility: Needs Assistance Bed Mobility: Supine to Sit     Supine to sit: Mod assist     General bed mobility comments: mod A with use of bed pad to come to EOB     Transfers Overall transfer level: Needs assistance Equipment used: Rolling walker (2 wheels) Transfers: Sit to/from Stand, Bed to chair/wheelchair/BSC Sit to Stand: Mod assist, Max assist     Step pivot transfers: Min assist     General transfer comment: pt up to max A for initial standing attempts from EOB, needs bed elevated and then mod A to stand. Pt able to pivot min A with incr time, able to take 4-5 steps forward with close chair follow     Balance Overall balance assessment: Needs assistance Sitting-balance support: Feet supported, Single extremity supported Sitting balance-Leahy Scale: Fair     Standing balance support: Reliant on assistive device for balance, During functional activity Standing balance-Leahy Scale: Poor Standing balance comment: reliant on external support                           ADL either performed or assessed with clinical judgement   ADL Overall ADL's : Needs assistance/impaired     Grooming: Oral care;Sitting;Set up Grooming Details (indicate cue type and reason): mouthrinse             Lower Body Dressing: Maximal assistance Lower Body Dressing Details (indicate cue type and reason): donning shoes and L foot brace Toilet Transfer: Maximal assistance   Toileting- Clothing Manipulation and Hygiene: Maximal assistance       Functional mobility during ADLs: Maximal assistance      Extremity/Trunk Assessment Upper Extremity Assessment RUE Deficits / Details: 3+/5 grasp, denies sensation changes, mild pain with attempts at wrist ROM RUE Coordination: decreased fine motor;decreased gross motor   Lower Extremity Assessment Lower Extremity  Assessment: Defer to PT evaluation        Vision   Vision Assessment?: No apparent visual deficits   Perception Perception Perception: Not tested   Praxis Praxis Praxis: Not tested   Communication Communication Communication: No apparent difficulties   Cognition Arousal:  Alert Behavior During Therapy: WFL for tasks assessed/performed Cognition: No apparent impairments                               Following commands: Intact        Cueing   Cueing Techniques: Verbal cues, Gestural cues  Exercises      Shoulder Instructions       General Comments VSS on RA    Pertinent Vitals/ Pain       Pain Assessment Pain Assessment: No/denies pain  Home Living                                          Prior Functioning/Environment              Frequency  Min 2X/week        Progress Toward Goals  OT Goals(current goals can now be found in the care plan section)  Progress towards OT goals: Progressing toward goals  Acute Rehab OT Goals Patient Stated Goal: none stated OT Goal Formulation: With patient Time For Goal Achievement: 06/28/24 Potential to Achieve Goals: Good ADL Goals Pt Will Perform Grooming: Independently;sitting Pt Will Perform Upper Body Dressing: with min assist;sitting Pt Will Perform Lower Body Dressing: with min assist;sit to/from stand;sitting/lateral leans Pt Will Transfer to Toilet: with min assist;ambulating;regular height toilet Pt/caregiver will Perform Home Exercise Program: Increased ROM;Increased strength;Right Upper extremity;With minimal assist;With written HEP provided  Plan      Co-evaluation                 AM-PAC OT 6 Clicks Daily Activity     Outcome Measure   Help from another person eating meals?: A Little Help from another person taking care of personal grooming?: A Little Help from another person toileting, which includes using toliet, bedpan, or urinal?: A Lot Help from another person bathing (including washing, rinsing, drying)?: A Lot Help from another person to put on and taking off regular upper body clothing?: A Lot Help from another person to put on and taking off regular lower body clothing?: A Lot 6 Click Score: 14    End of Session Equipment  Utilized During Treatment: Rolling walker (2 wheels);Gait belt  OT Visit Diagnosis: Unsteadiness on feet (R26.81);Other abnormalities of gait and mobility (R26.89);Muscle weakness (generalized) (M62.81)   Activity Tolerance Patient tolerated treatment well   Patient Left in chair;with call bell/phone within reach;with chair alarm set   Nurse Communication Mobility status        Time: 1030-1057 OT Time Calculation (min): 27 min  Charges: OT General Charges $OT Visit: 1 Visit OT Treatments $Self Care/Home Management : 8-22 mins $Therapeutic Activity: 8-22 mins  Pharoah Goggins K, OTD, OTR/L SecureChat Preferred Acute Rehab [phone]   Laneta POUR Koonce 06/16/2024, 11:51 AM

## 2024-06-16 NOTE — Plan of Care (Signed)
   Problem: Education: Goal: Knowledge of General Education information will improve Description Including pain rating scale, medication(s)/side effects and non-pharmacologic comfort measures Outcome: Progressing   Problem: Clinical Measurements: Goal: Will remain free from infection Outcome: Progressing   Problem: Nutrition: Goal: Adequate nutrition will be maintained Outcome: Progressing   Problem: Coping: Goal: Level of anxiety will decrease Outcome: Progressing

## 2024-06-16 NOTE — Progress Notes (Signed)
 PROGRESS NOTE  Luis Poole FMW:[id number] DOB: [birth year]   PCP: Pura Lenis, MD  Patient is from: Home.  Lives with son.  Uses rolling walker at baseline.  DOA: 06/07/2024 LOS: 7  Chief complaints Chief Complaint  Patient presents with   Dizziness     Brief Narrative / Interim history: 83 year old M with PMH of HFpEF, prediabetes, HTN, HLD,  CVA, PAD and chronic back pain brought to ED by EMS on 10/20 with complaints of chest pain and dizziness, and admitted with non-STEMI.  Reported episodic chest pain for 2 to 3 weeks usually triggered on exacerbated by stress due to argument amongst son, son's girlfriend and patient   In ED, stable vitals.  Troponin trended from 20-116.  EKG showed sinus rhythm with first-degree AVB, RBBB with no acute ST or T wave change.  Cardiology consulted.  General started on IV heparin    Patient underwent LHC with PCI and stent to mid LAD and first diagonal.  Cardiology recommended aspirin  and Plavix at least for 12 months followed by Plavix monotherapy.  Therapy recommended SNF.  Subjective: Seen and examined earlier this morning.  No major events overnight or this morning.  No complaints.  Denies chest pain, shortness of breath, GI or UTI symptoms.   Assessment and plan: Non-STEMI: Presents with episodic chest pain for 2 to 3 weeks.  Troponin trended from 20-116.  EKG without acute ischemic finding but first-degree AVB and RBBB.  Significant risk factors.  Limited TTE with LVEF of 65 to 70% but not able to evaluate for RWMA. -S/p LHC, PCI and DES to mid LAD and first diagonal - Cardiology recs: Aspirin  81 mg daily Clopidogrel 75 mg daily Atorvastatin  80 mg daily Amlodipine  5 mg daily Irbesartan  300 mg daily Recheck a lipid profile in 3 months.  If LDL does not reach target less than 70, consider switching to PCSK9 inhibitor since he has markedly elevated LP(a). Follow up as an outpatient: 06/18/2024 with Orren Fabry, PA   Essential  hypertension: Normotensive. - Antihypertensive meds as above.  Bradycardia/first-degree AVB - Avoid nodal blocking agents.  Prediabetes: A1c 5.9%.  Not on meds.  Physical deconditioning -PT/OT-recommended SNF.  History of gout -Continue colchicine   Hyperlipidemia - Continue statin  Body mass index is 28.04 kg/m.          DVT prophylaxis:  enoxaparin  (LOVENOX ) injection 40 mg Start: 06/16/24 1430  Code Status: Full code Family Communication: None at bedside Level of care: Telemetry Cardiac Status is: Inpatient Remains inpatient appropriate because: SNF   Final disposition: SNF.  Medically stable for discharge.   35 minutes with more than 50% spent in reviewing records, counseling patient/family and coordinating care.  Consultants:  Cardiology  Procedures: 10/21-LHC-3 focal lesions in a moderate-sized OM1, large D1 and mid LAD.  10/24-successful PCI-DES mid-LAD and PCI-DES 1st Diagonal, unsuccessful attemt to PCI the OM1, also found to have CTO of small distal LCX and diffuse severe disease in small R-PDA   Microbiology summarized: None  Objective: Vitals:   06/16/24 0254 06/16/24 0328 06/16/24 0749 06/16/24 1158  BP: 122/69  122/61 110/62  Pulse: (!) 53 (!) 57 (!) 59 (!) 52  Resp: 14 15 12 14   Temp: 98.3 F (36.8 C)  (!) 97.5 F (36.4 C) 98.8 F (37.1 C)  TempSrc: Oral  Oral Oral  SpO2: 99% 97% 98% 99%  Weight:  81.2 kg    Height:        Examination:  GENERAL: No apparent distress.  Nontoxic.  HEENT: MMM.  Vision and hearing grossly intact.  NECK: Supple.  No apparent JVD.  RESP:  No IWOB.  Fair aeration bilaterally. CVS:  RRR. Heart sounds normal.  ABD/GI/GU: BS+. Abd soft, NTND.  MSK/EXT:  Moves extremities. No apparent deformity. No edema.  SKIN: no apparent skin lesion or wound NEURO: AA.  Oriented appropriately.  No apparent focal neuro deficit. PSYCH: Calm. Normal affect.   Sch Meds:  Scheduled Meds:  amLODipine   5 mg Oral Daily    aspirin  EC  81 mg Oral Daily   atorvastatin   80 mg Oral Daily   clopidogrel  75 mg Oral Q breakfast   colchicine   0.6 mg Oral Daily   enoxaparin  (LOVENOX ) injection  40 mg Subcutaneous Q24H   free water  500 mL Oral Once   irbesartan   300 mg Oral Daily   lidocaine   1 patch Transdermal Q24H   pantoprazole   40 mg Oral Daily   sodium chloride  flush  3 mL Intravenous Q12H   sodium chloride  flush  3 mL Intravenous Q12H   Continuous Infusions: PRN Meds:.acetaminophen , allopurinol , alum & mag hydroxide-simeth, cyclobenzaprine , loperamide, nitroGLYCERIN, ondansetron  (ZOFRAN ) IV, polyethylene glycol, sodium chloride  flush, sodium chloride  flush  Antimicrobials: Anti-infectives (From admission, onward)    None        I have personally reviewed the following labs and images: CBC: Recent Labs  Lab 06/12/24 0214 06/13/24 0404 06/14/24 0254 06/15/24 0240 06/16/24 0236  WBC 4.8 5.3 4.8 4.4 4.3  HGB 10.4* 9.7* 9.2* 10.5* 9.6*  HCT 32.2* 30.3* 28.4* 32.7* 29.7*  MCV 87.3 88.1 87.9 87.7 87.6  PLT 178 174 164 198 193   BMP &GFR Recent Labs  Lab 06/10/24 0247 06/11/24 0239 06/12/24 0214 06/15/24 0240 06/16/24 0236  NA 137 136 131* 135 135  K 3.8 3.9 4.0 4.0 4.1  CL 105 105 101 105 107  CO2 23 22 20* 23 21*  GLUCOSE 111* 106* 111* 112* 108*  BUN 13 12 12 21 19   CREATININE 1.21 1.34* 1.20 1.46* 1.29*  CALCIUM  8.9 8.6* 8.4* 8.6* 8.6*  MG  --   --   --   --  2.3   Estimated Creatinine Clearance: 45.8 mL/min (A) (by C-G formula based on SCr of 1.29 mg/dL (H)). Liver & Pancreas: No results for input(s): AST, ALT, ALKPHOS, BILITOT, PROT, ALBUMIN in the last 168 hours. No results for input(s): LIPASE, AMYLASE in the last 168 hours. No results for input(s): AMMONIA in the last 168 hours. Diabetic: No results for input(s): HGBA1C in the last 72 hours. No results for input(s): GLUCAP in the last 168 hours. Cardiac Enzymes: No results for input(s): CKTOTAL,  CKMB, CKMBINDEX, TROPONINI in the last 168 hours. No results for input(s): PROBNP in the last 8760 hours. Coagulation Profile: No results for input(s): INR, PROTIME in the last 168 hours. Thyroid  Function Tests: No results for input(s): TSH, T4TOTAL, FREET4, T3FREE, THYROIDAB in the last 72 hours. Lipid Profile: No results for input(s): CHOL, HDL, LDLCALC, TRIG, CHOLHDL, LDLDIRECT in the last 72 hours. Anemia Panel: No results for input(s): VITAMINB12, FOLATE, FERRITIN, TIBC, IRON, RETICCTPCT in the last 72 hours. Urine analysis:    Component Value Date/Time   COLORURINE YELLOW 05/11/2024 2338   APPEARANCEUR HAZY (A) 05/11/2024 2338   LABSPEC 1.010 05/11/2024 2338   PHURINE 5.5 05/11/2024 2338   GLUCOSEU NEGATIVE 05/11/2024 2338   HGBUR NEGATIVE 05/11/2024 2338   BILIRUBINUR NEGATIVE 05/11/2024 2338   KETONESUR NEGATIVE 05/11/2024 2338   PROTEINUR NEGATIVE  05/11/2024 2338   UROBILINOGEN 0.2 08/28/2012 1904   NITRITE NEGATIVE 05/11/2024 2338   LEUKOCYTESUR NEGATIVE 05/11/2024 2338   Sepsis Labs: Invalid input(s): PROCALCITONIN, LACTICIDVEN  Microbiology: No results found for this or any previous visit (from the past 240 hours).  Radiology Studies: No results found.    Baudelio Karnes T. Dillan Lunden [facility]  If 7PM-7AM, please contact night-coverage [email or web address] 06/16/2024, 1:35 PM

## 2024-06-17 DIAGNOSIS — I5032 Chronic diastolic (congestive) heart failure: Secondary | ICD-10-CM | POA: Diagnosis not present

## 2024-06-17 DIAGNOSIS — G8929 Other chronic pain: Secondary | ICD-10-CM | POA: Diagnosis not present

## 2024-06-17 DIAGNOSIS — I10 Essential (primary) hypertension: Secondary | ICD-10-CM | POA: Diagnosis not present

## 2024-06-17 DIAGNOSIS — I214 Non-ST elevation (NSTEMI) myocardial infarction: Secondary | ICD-10-CM | POA: Diagnosis not present

## 2024-06-17 LAB — IRON AND TIBC
Iron: 32 ug/dL — ABNORMAL LOW (ref 45–182)
Saturation Ratios: 15 % — ABNORMAL LOW (ref 17.9–39.5)
TIBC: 211 ug/dL — ABNORMAL LOW (ref 250–450)
UIBC: 179 ug/dL

## 2024-06-17 LAB — RETICULOCYTES
Immature Retic Fract: 17.2 % — ABNORMAL HIGH (ref 2.3–15.9)
RBC.: 3.37 MIL/uL — ABNORMAL LOW (ref 4.22–5.81)
Retic Count, Absolute: 38.1 K/uL (ref 19.0–186.0)
Retic Ct Pct: 1.1 % (ref 0.4–3.1)

## 2024-06-17 LAB — RENAL FUNCTION PANEL
Albumin: 2.6 g/dL — ABNORMAL LOW (ref 3.5–5.0)
Anion gap: 11 (ref 5–15)
BUN: 16 mg/dL (ref 8–23)
CO2: 22 mmol/L (ref 22–32)
Calcium: 8.6 mg/dL — ABNORMAL LOW (ref 8.9–10.3)
Chloride: 103 mmol/L (ref 98–111)
Creatinine, Ser: 1.15 mg/dL (ref 0.61–1.24)
GFR, Estimated: >60 mL/min (ref >60)
Glucose, Bld: 94 mg/dL (ref 70–99)
Phosphorus: 3.1 mg/dL (ref 2.5–4.6)
Potassium: 4.4 mmol/L (ref 3.5–5.1)
Sodium: 136 mmol/L (ref 135–145)

## 2024-06-17 LAB — CBC
HCT: 29.3 % — ABNORMAL LOW (ref 39.0–52.0)
Hemoglobin: 9.6 g/dL — ABNORMAL LOW (ref 13.0–17.0)
MCH: 28.2 pg (ref 26.0–34.0)
MCHC: 32.8 g/dL (ref 30.0–36.0)
MCV: 86.2 fL (ref 80.0–100.0)
Platelets: 215 K/uL (ref 150–400)
RBC: 3.4 MIL/uL — ABNORMAL LOW (ref 4.22–5.81)
RDW: 13.4 % (ref 11.5–15.5)
WBC: 4.1 K/uL (ref 4.0–10.5)
nRBC: 0 % (ref 0.0–0.2)

## 2024-06-17 LAB — MAGNESIUM: Magnesium: 2.1 mg/dL (ref 1.7–2.4)

## 2024-06-17 LAB — FERRITIN: Ferritin: 96 ng/mL (ref 24–336)

## 2024-06-17 LAB — VITAMIN B12: Vitamin B-12: 342 pg/mL (ref 180–914)

## 2024-06-17 LAB — FOLATE: Folate: 17.5 ng/mL (ref >5.9)

## 2024-06-17 NOTE — Progress Notes (Signed)
 PT Cancellation Note  Patient Details Name: Luis Poole MRN: [id number] DOB: Dec 21, 1940   Cancelled Treatment:    Reason Eval/Treat Not Completed: (P) Other (comment) (pt on bed pan at time of attempt) Will continue efforts per PT plan of care as schedule permits, but likely will not have time to reattempt until following date.    Veldon Wager M Eliasar Hlavaty 06/17/2024, 5:18 PM

## 2024-06-17 NOTE — Progress Notes (Signed)
 PROGRESS NOTE  Luis Poole FMW:[id number] DOB: June 03, 1941   PCP: Pura Lenis, MD  Patient is from: Home.  Lives with son.  Uses rolling walker at baseline.  DOA: 06/07/2024 LOS: 8  Chief complaints Chief Complaint  Patient presents with   Dizziness     Brief Narrative / Interim history: 83 year old M with PMH of HFpEF, prediabetes, HTN, HLD,  CVA, PAD and chronic back pain brought to ED by EMS on 10/20 with complaints of chest pain and dizziness, and admitted with non-STEMI.  Reported episodic chest pain for 2 to 3 weeks usually triggered on exacerbated by stress due to argument amongst son, son's girlfriend and patient   In ED, stable vitals.  Troponin trended from 20-116.  EKG showed sinus rhythm with first-degree AVB, RBBB with no acute ST or T wave change.  Cardiology consulted.  General started on IV heparin    Patient underwent LHC with PCI and stent to mid LAD and first diagonal.  Cardiology recommended aspirin  and Plavix at least for 12 months followed by Plavix monotherapy.  Therapy recommended SNF.  Medically stable for discharge.  Subjective: Seen and examined earlier this morning.  Had some loose stool last night.  No complaints.  Denies chest pain, shortness of breath, nausea, vomiting or abdominal pain.   Assessment and plan: Non-STEMI: Presents with episodic chest pain for 2 to 3 weeks.  Troponin trended from 20-116.  EKG without acute ischemic finding but first-degree AVB and RBBB.  Significant risk factors.  Limited TTE with LVEF of 65 to 70% but not able to evaluate for RWMA. -S/p LHC, PCI and DES to mid LAD and first diagonal - Cardiology recs: Aspirin  81 mg daily Clopidogrel 75 mg daily Atorvastatin  80 mg daily Amlodipine  5 mg daily Irbesartan  300 mg daily Recheck a lipid profile in 3 months.  If LDL does not reach target less than 70, consider switching to PCSK9 inhibitor since he has markedly elevated LP(a). Follow up as an outpatient: 06/18/2024 with  Orren Fabry, PA   Essential hypertension: Normotensive. - Antihypertensive meds as above.  Bradycardia/first-degree AVB: Has positive chronotropic effect to exertion. - Avoid nodal blocking agents.  Prediabetes: A1c 5.9%.  Not on meds.  Physical deconditioning -PT/OT-recommended SNF.  History of gout -Continue colchicine   Hyperlipidemia - Continue statin  Body mass index is 28.49 kg/m.          DVT prophylaxis:  enoxaparin  (LOVENOX ) injection 40 mg Start: 06/16/24 1500  Code Status: Full code Family Communication: None at bedside Level of care: Telemetry Cardiac Status is: Inpatient Remains inpatient appropriate because: SNF   Final disposition: SNF.  Medically stable for discharge.   35 minutes with more than 50% spent in reviewing records, counseling patient/family and coordinating care.  Consultants:  Cardiology  Procedures: 10/21-LHC-3 focal lesions in a moderate-sized OM1, large D1 and mid LAD.  10/24-successful PCI-DES mid-LAD and PCI-DES 1st Diagonal, unsuccessful attemt to PCI the OM1, also found to have CTO of small distal LCX and diffuse severe disease in small R-PDA   Microbiology summarized: None  Objective: Vitals:   06/17/24 0500 06/17/24 0725 06/17/24 1011 06/17/24 1138  BP:  (!) 135/59 118/85 (!) 145/76  Pulse:  (!) 53  (!) 54  Resp:  17  16  Temp:  98.2 F (36.8 C)  98 F (36.7 C)  TempSrc:  Oral  Oral  SpO2:  97%  99%  Weight: 82.5 kg     Height:        Examination:  GENERAL: No apparent distress.  Nontoxic. HEENT: MMM.  Vision and hearing grossly intact.  NECK: Supple.  No apparent JVD.  RESP:  No IWOB.  Fair aeration bilaterally. CVS:  RRR. Heart sounds normal.  ABD/GI/GU: BS+. Abd soft, NTND.  MSK/EXT:  Moves extremities. No apparent deformity. No edema.  SKIN: no apparent skin lesion or wound NEURO: AA.  Oriented appropriately.  No apparent focal neuro deficit. PSYCH: Calm. Normal affect.   Sch Meds:  Scheduled  Meds:  amLODipine   5 mg Oral Daily   aspirin  EC  81 mg Oral Daily   atorvastatin   80 mg Oral Daily   clopidogrel  75 mg Oral Q breakfast   colchicine   0.6 mg Oral Daily   enoxaparin  (LOVENOX ) injection  40 mg Subcutaneous Q24H   free water  500 mL Oral Once   irbesartan   300 mg Oral Daily   lidocaine   1 patch Transdermal Q24H   pantoprazole   40 mg Oral Daily   polycarbophil  1,250 mg Oral Daily   sodium chloride  flush  3 mL Intravenous Q12H   sodium chloride  flush  3 mL Intravenous Q12H   Continuous Infusions: PRN Meds:.acetaminophen , allopurinol , alum & mag hydroxide-simeth, cyclobenzaprine , loperamide, nitroGLYCERIN, ondansetron  (ZOFRAN ) IV, polyethylene glycol, sodium chloride  flush, sodium chloride  flush  Antimicrobials: Anti-infectives (From admission, onward)    None        I have personally reviewed the following labs and images: CBC: Recent Labs  Lab 06/13/24 0404 06/14/24 0254 06/15/24 0240 06/16/24 0236 06/17/24 0318  WBC 5.3 4.8 4.4 4.3 4.1  HGB 9.7* 9.2* 10.5* 9.6* 9.6*  HCT 30.3* 28.4* 32.7* 29.7* 29.3*  MCV 88.1 87.9 87.7 87.6 86.2  PLT 174 164 198 193 215   BMP &GFR Recent Labs  Lab 06/11/24 0239 06/12/24 0214 06/15/24 0240 06/16/24 0236 06/17/24 0318  NA 136 131* 135 135 136  K 3.9 4.0 4.0 4.1 4.4  CL 105 101 105 107 103  CO2 22 20* 23 21* 22  GLUCOSE 106* 111* 112* 108* 94  BUN 12 12 21 19 16   CREATININE 1.34* 1.20 1.46* 1.29* 1.15  CALCIUM  8.6* 8.4* 8.6* 8.6* 8.6*  MG  --   --   --  2.3 2.1  PHOS  --   --   --   --  3.1   Estimated Creatinine Clearance: 51.8 mL/min (by C-G formula based on SCr of 1.15 mg/dL). Liver & Pancreas: Recent Labs  Lab 06/17/24 0318  ALBUMIN 2.6*   No results for input(s): LIPASE, AMYLASE in the last 168 hours. No results for input(s): AMMONIA in the last 168 hours. Diabetic: No results for input(s): HGBA1C in the last 72 hours. No results for input(s): GLUCAP in the last 168 hours. Cardiac  Enzymes: No results for input(s): CKTOTAL, CKMB, CKMBINDEX, TROPONINI in the last 168 hours. No results for input(s): PROBNP in the last 8760 hours. Coagulation Profile: No results for input(s): INR, PROTIME in the last 168 hours. Thyroid  Function Tests: No results for input(s): TSH, T4TOTAL, FREET4, T3FREE, THYROIDAB in the last 72 hours. Lipid Profile: No results for input(s): CHOL, HDL, LDLCALC, TRIG, CHOLHDL, LDLDIRECT in the last 72 hours. Anemia Panel: Recent Labs    06/17/24 0318  VITAMINB12 342  FOLATE 17.5  FERRITIN 96  TIBC 211*  IRON 32*  RETICCTPCT 1.1   Urine analysis:    Component Value Date/Time   COLORURINE YELLOW 05/11/2024 2338   APPEARANCEUR HAZY (A) 05/11/2024 2338   LABSPEC 1.010 05/11/2024 2338  PHURINE 5.5 05/11/2024 2338   GLUCOSEU NEGATIVE 05/11/2024 2338   HGBUR NEGATIVE 05/11/2024 2338   BILIRUBINUR NEGATIVE 05/11/2024 2338   KETONESUR NEGATIVE 05/11/2024 2338   PROTEINUR NEGATIVE 05/11/2024 2338   UROBILINOGEN 0.2 08/28/2012 1904   NITRITE NEGATIVE 05/11/2024 2338   LEUKOCYTESUR NEGATIVE 05/11/2024 2338   Sepsis Labs: Invalid input(s): PROCALCITONIN, LACTICIDVEN  Microbiology: No results found for this or any previous visit (from the past 240 hours).  Radiology Studies: No results found.    Anola Mcgough T. Eldonna Neuenfeldt [facility]  If 7PM-7AM, please contact night-coverage [email or web address] 06/17/2024, 11:55 AM

## 2024-06-17 NOTE — TOC Progression Note (Addendum)
 Transition of Care [facility]) - Progression Note    Patient Details  Name: Luis Poole MRN: [id number] Date of Birth: 03-Jun-1941  Transition of Care [facility]) CM/SW Contact  Lauraine FORBES Saa, LCSWA Phone Number: 06/17/2024, 9:06 AM  Clinical Narrative:     9:06 AM [facility] SNF admissions informed CSW that they do not have bed availability for discharge today. MD and bedside RN made aware. CSW will continue to follow.  2:11 PM SNF informed CSW that patient can admit to them tomorrow. MD and bedside RN made aware.  Expected Discharge Plan: Skilled Nursing Facility Barriers to Discharge: Continued Medical Work up               Expected Discharge Plan and Services In-house Referral: Clinical Social Work     Living arrangements for the past 2 months: Single Family Home Expected Discharge Date: 06/17/24                                     Social Drivers of Health (SDOH) Interventions SDOH Screenings   Food Insecurity: No Food Insecurity (06/08/2024)  Housing: Low Risk  (06/08/2024)  Transportation Needs: No Transportation Needs (06/08/2024)  Utilities: Not At Risk (06/08/2024)  Financial Resource Strain: Low Risk  (09/25/2023)   Received from [facility]  Physical Activity: Unknown (06/26/2023)   Received from [facility]  Social Connections: Moderately Isolated (06/08/2024)  Stress: Stress Concern Present (06/26/2023)   [facility] from [facility]  Tobacco Use: Medium Risk (06/08/2024)    Readmission Risk Interventions     No data to display

## 2024-06-17 NOTE — Plan of Care (Signed)
  Problem: Activity: Goal: Ability to tolerate increased activity will improve Outcome: Progressing   Problem: Activity: Goal: Risk for activity intolerance will decrease Outcome: Progressing   Problem: Nutrition: Goal: Adequate nutrition will be maintained Outcome: Progressing   Problem: Pain Managment: Goal: General experience of comfort will improve and/or be controlled Outcome: Progressing

## 2024-06-17 NOTE — Progress Notes (Signed)
 [facility] night cross cover note:   I was notified by the patient's RN  that the pt is experiencing some loose stool, but refusing Imodium. No report of associated abd pain. Afebrile.  I subsequently added fiber via daily FiberCon.      Eva Pore, DO Hospitalist

## 2024-06-18 ENCOUNTER — Ambulatory Visit: Attending: Physician Assistant | Admitting: Physician Assistant

## 2024-06-18 DIAGNOSIS — Z8673 Personal history of transient ischemic attack (TIA), and cerebral infarction without residual deficits: Secondary | ICD-10-CM | POA: Diagnosis not present

## 2024-06-18 DIAGNOSIS — I5032 Chronic diastolic (congestive) heart failure: Secondary | ICD-10-CM | POA: Diagnosis not present

## 2024-06-18 DIAGNOSIS — G8929 Other chronic pain: Secondary | ICD-10-CM | POA: Diagnosis not present

## 2024-06-18 DIAGNOSIS — I214 Non-ST elevation (NSTEMI) myocardial infarction: Secondary | ICD-10-CM | POA: Diagnosis not present

## 2024-06-18 NOTE — Discharge Summary (Signed)
 Physician Discharge Summary  Luis Poole FMW:[id number] DOB: [birth year] DOA: 06/07/2024  PCP: Pura Lenis, MD  Admit date: 06/07/2024 Discharge date: 06/18/24  Admitted From: Home Disposition: SNF Recommendations for Outpatient Follow-up:  Outpatient follow-up with cardiology as below Check CMP and CBC in 1 week Please follow up on the following pending results: None   Discharge Condition: Stable CODE STATUS: Full code Diet Orders (From admission, onward)     Start     Ordered   06/12/24 0000  Diet - low sodium heart healthy        06/12/24 1132   06/11/24 1304  Diet Heart Room service appropriate? Yes; Fluid consistency: Thin  Diet effective now       Question Answer Comment  Room service appropriate? Yes   Fluid consistency: Thin      06/11/24 1304             Contact information for follow-up providers     Lucien Orren SAILOR, PA-C Follow up.   Specialty: Cardiology Why: Hospital follow-up with Cardiology scheduled for 06/18/2024 at 2:45pm. Please arrive 20 minutes early for check-in. If this date/ time does not work for you, please call our office to reschedule. Contact information: [address] [place] [address] [phone]              Contact information for after-discharge care     Destination     [facility] .   Service: Skilled Nursing Contact information: [address] [place]  [address] [phone]                     Hospital course 83 year old M with PMH of HFpEF, prediabetes, HTN, HLD,  CVA, PAD and chronic back pain brought to ED by EMS on 10/20 with complaints of chest pain and dizziness, and admitted with non-STEMI.   Reported episodic chest pain for 2 to 3 weeks usually triggered on exacerbated by stress due to argument amongst son, son's girlfriend and patient   In ED, stable vitals.  Troponin trended from 20-116.  EKG showed sinus rhythm with first-degree AVB, RBBB with no acute  ST or T wave change.  Cardiology consulted.  General started on IV heparin    Patient underwent LHC with PCI and stent to mid LAD and first diagonal.  Cardiology recommended aspirin  and Plavix at least for 12 months followed by Plavix monotherapy.  Outpatient follow-up with cardiology as above.   Therapy recommended SNF.  See individual problem list below for more.   Problems addressed during this hospitalization Non-STEMI: Presents with episodic chest pain for 2 to 3 weeks.  Troponin trended from 20-116.  EKG without acute ischemic finding but first-degree AVB and RBBB.  Significant risk factors.  Limited TTE with LVEF of 65 to 70% but not able to evaluate for RWMA. -S/p LHC, PCI and DES to mid LAD and first diagonal - Cardiology recs: Aspirin  81 mg daily Clopidogrel 75 mg daily Atorvastatin  80 mg daily Amlodipine  5 mg daily Irbesartan  300 mg daily Recheck a lipid profile in 3 months.  If LDL does not reach target less than 70, consider switching to PCSK9 inhibitor since he has markedly elevated LP(a). Follow up as an outpatient: 06/18/2024 with Orren Lucien, PA     Essential hypertension: Normotensive. - Antihypertensive meds as above.   Bradycardia/first-degree AVB: Has positive chronotropic effect to exertion. - Avoid nodal blocking agents.   Prediabetes: A1c 5.9%.  Not on meds.   Physical deconditioning -PT/OT-recommended  SNF.   History of gout -Continue colchicine    Hyperlipidemia - Continue statin  Body mass index is 28.31 kg/m.           Consultations: Cardiology  Time spent 35  minutes  Vital signs Vitals:   06/17/24 2303 06/18/24 0400 06/18/24 0438 06/18/24 0734  BP: (!) 144/68 121/67  131/81  Pulse: (!) 55 (!) 53  62  Temp: 97.9 F (36.6 C) 97.7 F (36.5 C)  98.4 F (36.9 C)  Resp: 15 17  15   Height:      Weight:   82 kg   SpO2: 97% 95%  100%  TempSrc: Oral Oral  Oral  BMI (Calculated):   28.31      Discharge exam  GENERAL: No apparent  distress.  Nontoxic. HEENT: MMM.  Vision and hearing grossly intact.  NECK: Supple.  No apparent JVD.  RESP:  No IWOB.  Fair aeration bilaterally. CVS:  RRR. Heart sounds normal.  ABD/GI/GU: BS+. Abd soft, NTND.  MSK/EXT:  Moves extremities. No apparent deformity. No edema.  SKIN: no apparent skin lesion or wound NEURO: Awake and alert. Oriented appropriately.  No apparent focal neuro deficit. PSYCH: Calm. Normal affect.   Discharge Instructions Discharge Instructions     Amb Referral to Cardiac Rehabilitation   Complete by: As directed    Diagnosis:  Coronary Stents PTCA NSTEMI     After initial evaluation and assessments completed: Virtual Based Care may be provided alone or in conjunction with Phase 2 Cardiac Rehab based on patient barriers.: Yes   Intensive Cardiac Rehabilitation (ICR) [facility] location only OR Traditional Cardiac Rehabilitation (TCR) *If criteria for ICR are not met will enroll in TCR [facility] only): Yes   Diet - low sodium heart healthy   Complete by: As directed    Discharge instructions   Complete by: As directed    -Plan to continue aspirin  and Plavix for minimum 12 months and then continue with Plavix monotherapy after first year - Continue Lipitor   Increase activity slowly   Complete by: As directed       Allergies as of 06/18/2024       Reactions   Ibuprofen  Itching   Lisinopril Other (See Comments)   Swollen lips   Pregabalin  Swelling   Recently filled 11/24 for 90ds and reports taking on 08/11/23   Gabapentin  Other (See Comments)   Dizziness        Medication List     STOP taking these medications    meclizine  12.5 MG tablet Commonly known as: ANTIVERT    meloxicam 7.5 MG tablet Commonly known as: Mobic   omeprazole  40 MG capsule Commonly known as: PRILOSEC   oxyCODONE -acetaminophen  5-325 MG tablet Commonly known as: PERCOCET/ROXICET   potassium chloride  10 MEQ tablet Commonly known as: KLOR-CON  M       TAKE these medications     allopurinol  100 MG tablet Commonly known as: ZYLOPRIM  Take 100 mg by mouth daily as needed (for gout flareups).   amLODipine  10 MG tablet Commonly known as: NORVASC  Take 0.5 tablets (5 mg total) by mouth daily.   aspirin  EC 81 MG tablet Take 1 tablet (81 mg total) by mouth daily.   atorvastatin  80 MG tablet Commonly known as: LIPITOR Take 1 tablet (80 mg total) by mouth daily. What changed:  medication strength how much to take   clopidogrel 75 MG tablet Commonly known as: PLAVIX Take 1 tablet (75 mg total) by mouth daily with breakfast.   colchicine  0.6  MG tablet Take 1 tablet (0.6 mg total) daily by mouth. What changed:  when to take this reasons to take this   cyclobenzaprine  5 MG tablet Commonly known as: FLEXERIL  Take 1 tablet (5 mg total) by mouth 2 (two) times daily as needed for muscle spasms.   ferrous sulfate  325 (65 FE) MG tablet Take 325 mg by mouth daily with breakfast.   lidocaine  5 % Commonly known as: Lidoderm  Place 1 patch onto the skin daily. Remove & Discard patch within 12 hours or as directed by MD   polyethylene glycol 17 g packet Commonly known as: MIRALAX  / GLYCOLAX  Take 17 g by mouth daily as needed for mild constipation.   valsartan  320 MG tablet Commonly known as: DIOVAN  Take 320 mg by mouth daily.         Procedures/Studies: 10/21-LHC-3 focal lesions in a moderate-sized OM1, large D1 and mid LAD.  10/24-successful PCI-DES mid-LAD and PCI-DES 1st Diagonal, unsuccessful attemt to PCI the OM1, also found to have CTO of small distal LCX and diffuse severe disease in small R-PDA    VAS US  UPPER EXTREMITY ARTERIAL DUPLEX Result Date: 06/14/2024  UPPER EXTREMITY DUPLEX STUDY Patient Name:  Luis Poole  Date of Exam:   06/13/2024 Medical Rec #: [id number]      Accession #:    [id number] Date of Birth: [birth year]       Patient Gender: M Patient Age:   89 years Exam Location:  [facility] Procedure:      VAS US  UPPER EXTREMITY  ARTERIAL DUPLEX Referring Phys: DAYNA DUNN --------------------------------------------------------------------------------  Indications: Pseudoaneurysm. History:     Patient has a history of catheterization via right radial artery              and upper extremity pain.  Limitations: Unable to move and properly position arm. Comparison Study: No prior exam. Performing Technologist: Edilia Elden Appl  Examination Guidelines: A complete evaluation includes B-mode imaging, spectral Doppler, color Doppler, and power Doppler as needed of all accessible portions of each vessel. Bilateral testing is considered an integral part of a complete examination. Limited examinations for reoccurring indications may be performed as noted.  Right Doppler Findings: +---------------+----------+---------+--------+--------+ Site           PSV (cm/s)Waveform StenosisComments +---------------+----------+---------+--------+--------+ Subclavian Dist          triphasic                 +---------------+----------+---------+--------+--------+ Radial Prox    76        triphasic                 +---------------+----------+---------+--------+--------+ Radial Mid     83        triphasic                 +---------------+----------+---------+--------+--------+ Radial Dist    114       triphasic                 +---------------+----------+---------+--------+--------+ Ulnar Prox     79        triphasic                 +---------------+----------+---------+--------+--------+ Ulnar Dist     101       triphasic                 +---------------+----------+---------+--------+--------+ Radial Wrist: 154cm/s-Triphasic. incision site, slightly elevated.   Summary:  Right: No pseudoaneurysms detected with in the radial or ulnar  arteries of the right upper extremity. Patent with triphasic        flow. *See table(s) above for measurements and observations. Electronically signed by Gaile New MD on 06/14/2024 at  10:25:29 AM.    Final    CARDIAC CATHETERIZATION Result Date: 06/11/2024 Images from the original result were not included.   Lesion #1 mid LAD lesion is 90% stenosed.   A drug-eluting stent was successfully placed using a STENT SYNERGY XD 2.50X16 deployed to 2.75 mm. Post intervention, there is a 0% residual stenosis.  TIMI-3 flow maintained.   Lesion #2 1st Diag lesion is 90% stenosed.  TIMI-3 flow   A drug-eluting stent was successfully placed using a STENT SYNERGY XD 2.25X12 => deployed to 2.4 mm.. Post intervention, there is a 0% residual stenosis.  TIMI-3 flow maintained   Lesion #3 mid Cx lesion is 99% stenosed.   Balloon angioplasty was performed using a BALLOON EMERGE MR PUSH 1.5X15. Post intervention, there is a 50% residual stenosis.  TIMI-3 flow maintained   Lesion #4 1st Mrg lesion is 99% stenosed.   Balloon angioplasty was attempted using a BALLOON EMERGE MR PUSH 1.5X8, however were not able to cross the lesion.  Post intervention, there is a 99% residual stenosis. . Was not able to advance a second wire across the lesion.   Dist Cx lesion is 100% stenosed beyond 2nd Mrg.   --------------------------------------------------------------------------- --------------------   Anticipated discharge date to be determined.   Continue to titrate GDMT Monitor for symptoms, could consider reattempt at PCI of the OM using a different guide catheter and perhaps a more robust wire.   Recommend uninterrupted dual antiplatelet therapy with Aspirin  81mg  daily and Clopidogrel 75mg  daily for a minimum of 12 months (ACS-Class I recommendation).   Would continue Plavix monotherapy after the first year but okay to interrupt. Diagnostic Dominance: Right      Intervention Successful DES PCI to the mid LAD with a 2.5 mm x 16 mm Synergy XD deployed a 2.75 mm reduced into the lesion to 0% Successful direct stent DES PCI of diagonal branch with Synergy XD 2.25 mm x 12 mm deployed to 2.4 mm. PTCA only of mid LCx into OM1 wrist  reducing lesion to 50% Unsuccessful PTCA/PCI of OM1-unable to cross lesion with a 1 5 balloon no C due to tortuosity and the actual location of the lesion. Was not able to advance a second wire across the lesion. RECOMMENDATIONS   Anticipated discharge date to be determined.   Continue to titrate GDMT Monitor for symptoms, could consider reattempt at PCI of the OM using a different guide catheter and perhaps a more robust wire.   Recommend uninterrupted dual antiplatelet therapy with Aspirin  81mg  daily and Clopidogrel 75mg  daily for a minimum of 12 months (ACS-Class I recommendation).   Would continue Plavix monotherapy after the first year but okay to interrupt. Alm Clay, MD  VAS US  DOPPLER PRE CABG Result Date: 06/09/2024 PREOPERATIVE VASCULAR EVALUATION Patient Name:  Luis Poole  Date of Exam:   06/09/2024 Medical Rec #: [id number]      Accession #:    [id number] Date of Birth: [birth year]       Patient Gender: M Patient Age:   23 years Exam Location:  [facility] Procedure:      VAS US  DOPPLER PRE CABG Referring Phys: HARRELL LIGHTFOOT --------------------------------------------------------------------------------  Indications:      Pre-CABG. Risk Factors:     Hypertension, Diabetes, past history of smoking, prior MI,  coronary artery disease, prior CVA, PAD. Other Factors:    CHF, CKD. Comparison Study: Previous carotid duplex on 10/07/2017 bilateral ICAs 1-39% Performing Technologist: Leigh Rom RVT, RDMS  Examination Guidelines: A complete evaluation includes B-mode imaging, spectral Doppler, color Doppler, and power Doppler as needed of all accessible portions of each vessel. Bilateral testing is considered an integral part of a complete examination. Limited examinations for reoccurring indications may be performed as noted.  Right Carotid Findings: +----------+--------+--------+--------+-------------------------+--------+           PSV cm/sEDV cm/sStenosisDescribe                  Comments +----------+--------+--------+--------+-------------------------+--------+ CCA Prox  46      7               heterogenous                      +----------+--------+--------+--------+-------------------------+--------+ CCA Distal54      11              heterogenous                      +----------+--------+--------+--------+-------------------------+--------+ ICA Prox  52      16              calcific and heterogenous         +----------+--------+--------+--------+-------------------------+--------+ ICA Distal49      16                                                +----------+--------+--------+--------+-------------------------+--------+ ECA       260     11      >50%    calcific                          +----------+--------+--------+--------+-------------------------+--------+ +----------+--------+-------+----------------+------------+           PSV cm/sEDV cmsDescribe        Arm Pressure +----------+--------+-------+----------------+------------+ Subclavian62             Multiphasic, TWO840          +----------+--------+-------+----------------+------------+ +---------+--------+--+--------+-+---------+ VertebralPSV cm/s22EDV cm/s5Antegrade +---------+--------+--+--------+-+---------+ Left Carotid Findings: +----------+-------+--------+--------+-----------------------+-----------------+           PSV    EDV cm/sStenosisDescribe               Comments                    cm/s                                                            +----------+-------+--------+--------+-----------------------+-----------------+ CCA Prox  123    17              heterogenous                             +----------+-------+--------+--------+-----------------------+-----------------+ CCA Distal43     8               heterogenous           intimal  thickening         +----------+-------+--------+--------+-----------------------+-----------------+ ICA Prox  28     9               heterogenous and                                                          calcific                                 +----------+-------+--------+--------+-----------------------+-----------------+ ICA Distal59     17                                                       +----------+-------+--------+--------+-----------------------+-----------------+ ECA       83     0                                                        +----------+-------+--------+--------+-----------------------+-----------------+  +----------+--------+--------+----------------+------------+ SubclavianPSV cm/sEDV cm/sDescribe        Arm Pressure +----------+--------+--------+----------------+------------+           136             Multiphasic, [id number]          +----------+--------+--------+----------------+------------+ +---------+--------+--+--------+--+---------+ VertebralPSV cm/s38EDV cm/s12Antegrade +---------+--------+--+--------+--+---------+  ABI Findings: +------------------+-----+----------+ Rt Pressure (mmHg)IndexWaveform   +------------------+-----+----------+ 159                    triphasic  +------------------+-----+----------+ 102               0.64 monophasic +------------------+-----+----------+ 55                0.35 monophasic +------------------+-----+----------+ 44                0.28 Abnormal   +------------------+-----+----------+ +------------------+-----+----------+ Lt Pressure (mmHg)IndexWaveform   +------------------+-----+----------+ 153                    triphasic  +------------------+-----+----------+ 122               0.77 monophasic +------------------+-----+----------+ 61                0.38 monophasic +------------------+-----+----------+ 32                0.20 Abnormal   +------------------+-----+----------+   Right Doppler Findings: +--------+--------+---------+ Site    PressureDoppler   +--------+--------+---------+ [id number]     triphasic +--------+--------+---------+ Radial          triphasic +--------+--------+---------+ Ulnar           triphasic +--------+--------+---------+  Left Doppler Findings: +--------+--------+---------+ Site    PressureDoppler   +--------+--------+---------+ [id number]     triphasic +--------+--------+---------+ Radial          triphasic +--------+--------+---------+ Ulnar           triphasic +--------+--------+---------+   Summary: Right Carotid: Velocities in the right ICA are consistent with a 1-39% stenosis. Left Carotid:  Velocities in the left ICA are consistent with a 1-39% stenosis. Vertebrals:  Bilateral vertebral arteries demonstrate antegrade flow. Subclavians: Normal flow hemodynamics were seen in bilateral subclavian              arteries. Right ABI: Resting right ankle-brachial index indicates moderate right lower extremity arterial disease. The right toe-brachial index is abnormal. Left ABI: Resting left ankle-brachial index indicates moderate left lower extremity arterial disease. The left toe-brachial index is abnormal. Bilateral Extremity: Doppler waveforms remain within normal limits with compression bilaterally for the radial arteries. Doppler waveforms remain within normal limits with compression bilaterally for the ulnar arteries.  Electronically signed by Gaile New MD on 06/09/2024 at 7:28:17 PM.    Final    CARDIAC CATHETERIZATION Result Date: 06/08/2024 Images from the original result were not included.   Prox RCA lesion is 40% stenosed. Cardiac Catheterization 06/08/24: Hemodynamic data: LVEDP 9 mmHg.  There is no pressure gradient across the aortic valve. Angiographic data: LM: Logical vessel, smooth and normal. LAD: Is a large vessel, gives origin to a very large D1 with secondary branch.  D1 has a proximal focal 90%  stenosis.  Mid LAD has a focal 90% stenosis.  Rest of the LAD has mild disease. LCx: Gives origin to moderate-sized OM1 with ostial 95% stenosis followed by subtotally occluded mid CX and gives origin to a very small OM 2. RCA: Dominant vessel.  Proximal 30-40% eccentric calcific stenosis. Small PDA with severe diffuse disease.  Moderate-sized PL branch without significant disease. Impression and recommendations: Patient has 3 focal lesions in a moderate-sized OM1, large D1 and mid LAD.  Patient may benefit from CT surgical evaluation to see if would be a good candidate for surgery.  Restart IV heparin  in view of ACS 4 to 6 hours after sheath pull. Could consider multivessel PCI as an option as well if not felt to be a surgical candidate.   ECHOCARDIOGRAM LIMITED Result Date: 06/08/2024    ECHOCARDIOGRAM LIMITED REPORT   Patient Name:   Luis Poole Date of Exam: 06/08/2024 Medical Rec #:  [id number]     Height:       67.0 in Accession #:    [id number]    Weight:       195.0 lb Date of Birth:  19-Sep-1940      BSA:          2.001 m Patient Age:    83 years      BP:           159/75 mmHg Patient Gender: M             HR:           60 bpm. Exam Location:  Inpatient Procedure: Limited Echo, Cardiac Doppler and Color Doppler (Both Spectral and            Color Flow Doppler were utilized during procedure). Indications:    NStemi  History:        Patient has prior history of Echocardiogram examinations, most                 recent 12/25/2023.  Sonographer:    VALENTE, ADAM Referring Phys: LONNIE SULLIVAN IMPRESSIONS  1. Suboptimal evaluation of the left ventricular wall motion. Cannot exclude basal inferior and inferolateral hypokinesis, but otherwise appears to have normal wall motion. Left ventricular ejection fraction, by estimation, is 65 to 70%. The left ventricle has normal function. Left ventricular endocardial border not optimally defined to evaluate  regional wall motion. Left ventricular diastolic function could  not be evaluated.  2. Right ventricular systolic function is normal. The right ventricular size is normal.  3. The aortic valve is tricuspid. There is mild calcification of the aortic valve. Aortic valve regurgitation is not visualized. Aortic valve sclerosis/calcification is present, without any evidence of aortic stenosis. FINDINGS  Left Ventricle: Suboptimal evaluation of the left ventricular wall motion. Cannot exclude basal inferior and inferolateral hypokinesis, but otherwise appears to have normal wall motion. Left ventricular ejection fraction, by estimation, is 65 to 70%. The left ventricle has normal function. Left ventricular endocardial border not optimally defined to evaluate regional wall motion. The left ventricular internal cavity size was normal in size. There is no left ventricular hypertrophy. Left ventricular diastolic function could not be evaluated. Right Ventricle: The right ventricular size is normal. Right vetricular wall thickness was not well visualized. Right ventricular systolic function is normal. Right Atrium: Prominent Eustachian valve. Pericardium: Trivial pericardial effusion is present. Tricuspid Valve: The tricuspid valve is grossly normal. Tricuspid valve regurgitation is trivial. Aortic Valve: The aortic valve is tricuspid. There is mild calcification of the aortic valve. Aortic valve regurgitation is not visualized. Aortic valve sclerosis/calcification is present, without any evidence of aortic stenosis. Aortic valve mean gradient measures 4.0 mmHg. Aortic valve peak gradient measures 6.9 mmHg. Aortic valve area, by VTI measures 2.74 cm. Pulmonic Valve: The pulmonic valve was not well visualized. Pulmonic valve regurgitation is not visualized. Venous: The inferior vena cava was not well visualized. LEFT VENTRICLE PLAX 2D LVIDd:         4.40 cm LVIDs:         3.20 cm LV PW:         1.10 cm LV IVS:        1.10 cm LVOT diam:     2.10 cm LV SV:         86 LV SV Index:   43 LVOT  Area:     3.46 cm  LEFT ATRIUM         Index LA diam:    3.80 cm 1.90 cm/m  AORTIC VALVE AV Area (Vmax):    2.86 cm AV Area (Vmean):   2.87 cm AV Area (VTI):     2.74 cm AV Vmax:           131.00 cm/s AV Vmean:          85.300 cm/s AV VTI:            0.314 m AV Peak Grad:      6.9 mmHg AV Mean Grad:      4.0 mmHg LVOT Vmax:         108.00 cm/s LVOT Vmean:        70.700 cm/s LVOT VTI:          0.248 m LVOT/AV VTI ratio: 0.79  AORTA Ao Root diam: 3.00 cm TRICUSPID VALVE TR Peak grad:   16.0 mmHg TR Vmax:        200.00 cm/s  SHUNTS Systemic VTI:  0.25 m Systemic Diam: 2.10 cm Jerel Croitoru MD Electronically signed by Jerel Balding MD Signature Date/Time: 06/08/2024/4:02:50 PM    Final    DG Chest 2 View Result Date: 06/07/2024 CLINICAL DATA:  Chest pain EXAM: CHEST - 2 VIEW COMPARISON:  05/22/2024 FINDINGS: The heart size and mediastinal contours are within normal limits. Both lungs are clear. The visualized skeletal structures show changes of prior fusion in the cervical spine.  Spinal stimulator is noted as well. IMPRESSION: No active cardiopulmonary disease. Electronically Signed   By: Oneil Devonshire M.D.   On: 06/07/2024 21:09   DG Lumbar Spine Complete Result Date: 06/04/2024 EXAM: 4 VIEW(S)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

## 2024-06-18 NOTE — TOC Transition Note (Signed)
 Transition of Care [facility]) - Discharge Note   Patient Details  Name: Luis Poole MRN: [id number] Date of Birth: 27-Aug-1940  [facility] CM/SW Contact:  Mai Longnecker E Esther Broyles, LCSW Phone Number: 06/18/2024, 10:55 AM   Clinical Narrative:    Discharge to [facility] today. Room 115. Confirmed with Admissions Worker Kia.  Updated MD, RN, patient, and sister Chiquita.  Asked RN to call report. EMS paperwork completed. [facility] called at 10:55.    Final next level of care: Skilled Nursing Facility Barriers to Discharge: Barriers Resolved   Patient Goals and CMS Choice Patient states their goals for this hospitalization and ongoing recovery are:: SNF CMS Medicare.gov Compare Post Acute Care list provided to:: Patient Choice offered to / list presented to : Patient, Sibling      Discharge Placement              Patient chooses bed at: [facility] Patient to be transferred to facility by: [facility] Name of family member notified: Chiquita - sister Patient and family notified of of transfer: 06/18/24  Discharge Plan and Services Additional resources added to the After Visit Summary for   In-house Referral: Clinical Social Work                                   Social Drivers of Health (SDOH) Interventions SDOH Screenings   Food Insecurity: No Food Insecurity (06/08/2024)  Housing: Low Risk  (06/08/2024)  Transportation Needs: No Transportation Needs (06/08/2024)  Utilities: Not At Risk (06/08/2024)  Financial Resource Strain: Low Risk  (09/25/2023)   Received from [facility]  Physical Activity: Unknown (06/26/2023)   Received from [facility]  Social Connections: Moderately Isolated (06/08/2024)  Stress: Stress Concern Present (06/26/2023)   Received from [facility]  Tobacco Use: Medium Risk (06/08/2024)     Readmission Risk Interventions     No data to display
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
## 2024-06-18 NOTE — Plan of Care (Signed)
  Problem: Education: Goal: Understanding of cardiac disease, CV risk reduction, and recovery process will improve Outcome: Progressing   Problem: Activity: Goal: Ability to tolerate increased activity will improve Outcome: Progressing   Problem: Cardiac: Goal: Ability to achieve and maintain adequate cardiovascular perfusion will improve Outcome: Progressing   Problem: Clinical Measurements: Goal: Ability to maintain clinical measurements within normal limits will improve Outcome: Progressing Goal: Respiratory complications will improve Outcome: Progressing Goal: Cardiovascular complication will be avoided Outcome: Progressing

## 2024-06-21 ENCOUNTER — Telehealth (HOSPITAL_COMMUNITY): Payer: Self-pay

## 2024-06-21 NOTE — Telephone Encounter (Signed)
 Pt currently doesn't have a follow up with his cardiologist. Pt currently in SNF, will reach out to pt at a later time for interest in cardiac rehab

## 2024-06-21 NOTE — Progress Notes (Signed)
 TCM NURSING DOCUMENTATION              TCM Requirements for Post-Discharge Contact Deadlines:  Discharge Date:: 06/18/24 7 calendar days post-discharge:: 06/25/2024 14 calendar days post-discharge:: 07/02/2024    Patient Name: Luis Poole Patient DOB: [birth year] Patient Current Location:   Discharge diagnoses:  Primary discharge diagnosis:: NSTEMI  UTR. Called x 1 and MyChart message sent.  Hello Lamar Abran Brought, this is Schuyler Irving, RN from [employer]. I tried reaching you to check in on your progress after your recent visit, but I was unable to get in touch. Please give us  a call back at [phone] at your earliest convenience. Unfortunately, you cannot respond to this message at this time.  Thank you!

## 2024-07-10 ENCOUNTER — Encounter (HOSPITAL_COMMUNITY): Payer: Self-pay | Admitting: *Deleted

## 2024-07-10 ENCOUNTER — Other Ambulatory Visit: Payer: Self-pay

## 2024-07-10 ENCOUNTER — Inpatient Hospital Stay (HOSPITAL_COMMUNITY)
Admission: EM | Admit: 2024-07-10 | Discharge: 2024-07-14 | DRG: 303 | Disposition: A | Discharge status: Home Health | Attending: Internal Medicine | Admitting: Internal Medicine

## 2024-07-10 ENCOUNTER — Emergency Department (HOSPITAL_COMMUNITY)

## 2024-07-10 DIAGNOSIS — Z981 Arthrodesis status: Secondary | ICD-10-CM

## 2024-07-10 DIAGNOSIS — Z8673 Personal history of transient ischemic attack (TIA), and cerebral infarction without residual deficits: Secondary | ICD-10-CM

## 2024-07-10 DIAGNOSIS — E1122 Type 2 diabetes mellitus with diabetic chronic kidney disease: Secondary | ICD-10-CM | POA: Diagnosis present

## 2024-07-10 DIAGNOSIS — E1169 Type 2 diabetes mellitus with other specified complication: Secondary | ICD-10-CM | POA: Diagnosis present

## 2024-07-10 DIAGNOSIS — I252 Old myocardial infarction: Secondary | ICD-10-CM

## 2024-07-10 DIAGNOSIS — E7849 Other hyperlipidemia: Secondary | ICD-10-CM | POA: Diagnosis present

## 2024-07-10 DIAGNOSIS — M109 Gout, unspecified: Secondary | ICD-10-CM | POA: Diagnosis present

## 2024-07-10 DIAGNOSIS — Z886 Allergy status to analgesic agent status: Secondary | ICD-10-CM

## 2024-07-10 DIAGNOSIS — M21372 Foot drop, left foot: Secondary | ICD-10-CM | POA: Diagnosis present

## 2024-07-10 DIAGNOSIS — R079 Chest pain, unspecified: Principal | ICD-10-CM | POA: Diagnosis present

## 2024-07-10 DIAGNOSIS — E119 Type 2 diabetes mellitus without complications: Secondary | ICD-10-CM | POA: Insufficient documentation

## 2024-07-10 DIAGNOSIS — Z79899 Other long term (current) drug therapy: Secondary | ICD-10-CM

## 2024-07-10 DIAGNOSIS — E876 Hypokalemia: Secondary | ICD-10-CM | POA: Diagnosis present

## 2024-07-10 DIAGNOSIS — Z7982 Long term (current) use of aspirin: Secondary | ICD-10-CM

## 2024-07-10 DIAGNOSIS — D631 Anemia in chronic kidney disease: Secondary | ICD-10-CM | POA: Diagnosis present

## 2024-07-10 DIAGNOSIS — I5032 Chronic diastolic (congestive) heart failure: Secondary | ICD-10-CM | POA: Diagnosis present

## 2024-07-10 DIAGNOSIS — I6930 Unspecified sequelae of cerebral infarction: Secondary | ICD-10-CM

## 2024-07-10 DIAGNOSIS — E1151 Type 2 diabetes mellitus with diabetic peripheral angiopathy without gangrene: Secondary | ICD-10-CM | POA: Diagnosis present

## 2024-07-10 DIAGNOSIS — I25118 Atherosclerotic heart disease of native coronary artery with other forms of angina pectoris: Principal | ICD-10-CM | POA: Diagnosis present

## 2024-07-10 DIAGNOSIS — I2510 Atherosclerotic heart disease of native coronary artery without angina pectoris: Secondary | ICD-10-CM | POA: Diagnosis present

## 2024-07-10 DIAGNOSIS — Z8249 Family history of ischemic heart disease and other diseases of the circulatory system: Secondary | ICD-10-CM

## 2024-07-10 DIAGNOSIS — I130 Hypertensive heart and chronic kidney disease with heart failure and stage 1 through stage 4 chronic kidney disease, or unspecified chronic kidney disease: Secondary | ICD-10-CM | POA: Diagnosis present

## 2024-07-10 DIAGNOSIS — Z888 Allergy status to other drugs, medicaments and biological substances status: Secondary | ICD-10-CM

## 2024-07-10 DIAGNOSIS — N1831 Chronic kidney disease, stage 3a: Secondary | ICD-10-CM | POA: Diagnosis present

## 2024-07-10 DIAGNOSIS — Z87891 Personal history of nicotine dependence: Secondary | ICD-10-CM

## 2024-07-10 DIAGNOSIS — Z955 Presence of coronary angioplasty implant and graft: Secondary | ICD-10-CM

## 2024-07-10 DIAGNOSIS — I10 Essential (primary) hypertension: Secondary | ICD-10-CM | POA: Diagnosis present

## 2024-07-10 DIAGNOSIS — Z7902 Long term (current) use of antithrombotics/antiplatelets: Secondary | ICD-10-CM

## 2024-07-10 DIAGNOSIS — D649 Anemia, unspecified: Secondary | ICD-10-CM | POA: Diagnosis present

## 2024-07-10 LAB — BASIC METABOLIC PANEL WITH GFR
Anion gap: 12 (ref 5–15)
BUN: 15 mg/dL (ref 8–23)
CO2: 21 mmol/L — ABNORMAL LOW (ref 22–32)
Calcium: 9.2 mg/dL (ref 8.9–10.3)
Chloride: 105 mmol/L (ref 98–111)
Creatinine, Ser: 1.15 mg/dL (ref 0.61–1.24)
GFR, Estimated: >60 mL/min (ref >60)
Glucose, Bld: 133 mg/dL — ABNORMAL HIGH (ref 70–99)
Potassium: 3.3 mmol/L — ABNORMAL LOW (ref 3.5–5.1)
Sodium: 138 mmol/L (ref 135–145)

## 2024-07-10 LAB — HEPATIC FUNCTION PANEL
ALT: 12 U/L (ref 0–44)
AST: 18 U/L (ref 15–41)
Albumin: 3.4 g/dL — ABNORMAL LOW (ref 3.5–5.0)
Alkaline Phosphatase: 70 U/L (ref 38–126)
Bilirubin, Direct: <0.1 mg/dL (ref 0.0–0.2)
Total Bilirubin: 0.4 mg/dL (ref 0.0–1.2)
Total Protein: 7.3 g/dL (ref 6.5–8.1)

## 2024-07-10 LAB — CBC
HCT: 33.9 % — ABNORMAL LOW (ref 39.0–52.0)
Hemoglobin: 10.8 g/dL — ABNORMAL LOW (ref 13.0–17.0)
MCH: 28.2 pg (ref 26.0–34.0)
MCHC: 31.9 g/dL (ref 30.0–36.0)
MCV: 88.5 fL (ref 80.0–100.0)
Platelets: 147 K/uL — ABNORMAL LOW (ref 150–400)
RBC: 3.83 MIL/uL — ABNORMAL LOW (ref 4.22–5.81)
RDW: 13.9 % (ref 11.5–15.5)
WBC: 3.6 K/uL — ABNORMAL LOW (ref 4.0–10.5)
nRBC: 0 % (ref 0.0–0.2)

## 2024-07-10 LAB — TROPONIN I (HIGH SENSITIVITY): Troponin I (High Sensitivity): 6 ng/L (ref <18)

## 2024-07-10 NOTE — ED Notes (Signed)
 Pt placed on cardiac monitor, bp cuff, and pulse ox. Attempted to contact CCMD regarding monitoring, however no orders at this time.

## 2024-07-10 NOTE — ED Provider Notes (Signed)
 [place] [facility] Provider Note   CSN: [id number] Arrival date & time: 07/10/24  2142     Patient presents with: Abdominal Pain and Chest Pain   Luis Poole is a 83 y.o. male with history of heart failure with preserved ejection fraction, hypertension, CVA, peripheral artery disease who came in via EMS on 06/07/2024 with chest pain and dizziness and admitted for NSTEMI.  Patient underwent catheterization with PCI and was initiated on aspirin  and Plavix  on 06/11/2024.  Patient found to be poor candidate for CABG. He presents today for concern for chest pain episode tonight night that started while he was carrying a leaf blower around his yard and he was arguing with his son. EMS was called and administered single nitroglycerin  with improvement in patient's chest pain.  He states that this time he just feels central chest tingling.  Resting comfortably at this time, patient difficult to redirect, but well-appearing at this time.  Patient was initially felt to be poor CABG candidate secondary to deconditioning and wheelchair dependence with frequent falls in the past, seen inpatient during recent admission by CT surgery, prior to PCI, which was not able to address all identified lesions on catheterization.   HPI     Prior to Admission medications   Medication Sig Start Date End Date Taking? Authorizing Provider  allopurinol  (ZYLOPRIM ) 100 MG tablet Take 100 mg by mouth daily as needed (for gout flareups).    [provider]  amLODipine  (NORVASC ) 10 MG tablet Take 0.5 tablets (5 mg total) by mouth daily. 08/12/23   Danford, Lonni SHAUNNA, MD  aspirin  EC 81 MG tablet Take 1 tablet (81 mg total) by mouth daily. 06/12/24   Drusilla Sabas RAMAN, MD  atorvastatin  (LIPITOR ) 80 MG tablet Take 1 tablet (80 mg total) by mouth daily. 06/13/24   Drusilla Sabas RAMAN, MD  clopidogrel  (PLAVIX ) 75 MG tablet Take 1 tablet (75 mg total) by mouth daily with breakfast.  06/13/24   Drusilla Sabas RAMAN, MD  colchicine  0.6 MG tablet Take 1 tablet (0.6 mg total) daily by mouth. Patient taking differently: Take 0.6 mg by mouth daily as needed (gout pain). 06/29/17   Dolan Mateo Larger, MD  cyclobenzaprine  (FLEXERIL ) 5 MG tablet Take 1 tablet (5 mg total) by mouth 2 (two) times daily as needed for muscle spasms. 05/25/24   Trine Raynell Moder, MD  ferrous sulfate  325 (65 FE) MG tablet Take 325 mg by mouth daily with breakfast.    [provider]  lidocaine  (LIDODERM ) 5 % Place 1 patch onto the skin daily. Remove & Discard patch within 12 hours or as directed by MD 03/31/24   Barrett, Jamie N, PA-C  polyethylene glycol (MIRALAX  / GLYCOLAX ) 17 g packet Take 17 g by mouth daily as needed for mild constipation. 11/29/22   Rizwan, Saima, MD  valsartan  (DIOVAN ) 320 MG tablet Take 320 mg by mouth daily. 03/29/24   [provider]    Allergies: Ibuprofen , Lisinopril, Pregabalin , and Gabapentin     Review of Systems  Constitutional: Negative.   HENT: Negative.    Respiratory: Negative.    Cardiovascular:  Positive for chest pain.  Gastrointestinal: Negative.   Neurological: Negative.     Updated Vital Signs BP (!) 159/77   Pulse 63   Temp 98.5 F (36.9 C) (Oral)   Resp 15   SpO2 100%   Physical Exam Vitals and nursing note reviewed.  Constitutional:      Appearance: He is  not ill-appearing or toxic-appearing.  HENT:     Head: Normocephalic and atraumatic.     Mouth/Throat:     Mouth: Mucous membranes are moist.     Pharynx: No oropharyngeal exudate or posterior oropharyngeal erythema.  Eyes:     General:        Right eye: No discharge.        Left eye: No discharge.     Conjunctiva/sclera: Conjunctivae normal.  Cardiovascular:     Rate and Rhythm: Normal rate and regular rhythm.     Pulses: Normal pulses.     Heart sounds: Normal heart sounds. No murmur heard. Pulmonary:     Effort: Pulmonary effort is normal. No respiratory distress.      Breath sounds: Normal breath sounds. No wheezing or rales.  Abdominal:     General: Bowel sounds are normal. There is no distension.     Palpations: Abdomen is soft.     Tenderness: There is no abdominal tenderness. There is no right CVA tenderness, left CVA tenderness, guarding or rebound.  Musculoskeletal:        General: No deformity.     Cervical back: Neck supple.  Skin:    General: Skin is warm and dry.     Capillary Refill: Capillary refill takes less than 2 seconds.  Neurological:     General: No focal deficit present.     Mental Status: He is alert and oriented to person, place, and time. Mental status is at baseline.  Psychiatric:        Mood and Affect: Mood normal.     (all labs ordered are listed, but only abnormal results are displayed) Labs Reviewed  BASIC METABOLIC PANEL WITH GFR - Abnormal; Notable for the following components:      Result Value   Potassium 3.3 (*)    CO2 21 (*)    Glucose, Bld 133 (*)    All other components within normal limits  CBC - Abnormal; Notable for the following components:   WBC 3.6 (*)    RBC 3.83 (*)    Hemoglobin 10.8 (*)    HCT 33.9 (*)    Platelets 147 (*)    All other components within normal limits  HEPATIC FUNCTION PANEL - Abnormal; Notable for the following components:   Albumin 3.4 (*)    All other components within normal limits  TROPONIN I (HIGH SENSITIVITY)  TROPONIN I (HIGH SENSITIVITY)    EKG: EKG Interpretation Date/Time:  Saturday July 10 2024 22:33:09 EST Ventricular Rate:  70 PR Interval:  200 QRS Duration:  148 QT Interval:  430 QTC Calculation: 464 R Axis:   51  Text Interpretation: Normal sinus rhythm Right bundle branch block Anterior infarct , age undetermined T wave abnormality, consider inferolateral ischemia Abnormal ECG When compared with ECG of 12-Jun-2024 07:39, No significant change since last tracing Confirmed by Dreama Longs ([id number]) on 07/10/2024 10:37:49 PM  Radiology: [facility]  Chest 2 View Result Date: 07/10/2024 EXAM: 2 VIEW(S) XRAY OF THE CHEST 07/10/2024 10:14:00 PM COMPARISON: 06/07/2024 CLINICAL HISTORY: cp cp FINDINGS: LINES, TUBES AND DEVICES: Spinal cord stimulator remains in place, unchanged. LUNGS AND PLEURA: No focal pulmonary opacity. No pleural effusion. No pneumothorax. HEART AND MEDIASTINUM: No acute abnormality of the cardiac and mediastinal silhouettes. BONES AND SOFT TISSUES: No acute osseous abnormality. IMPRESSION: 1. No acute cardiopulmonary process. Electronically signed by: Franky Crease MD 07/10/2024 10:28 PM EST RP Workstation: [id number]     Procedures   Medications Ordered in the  ED - No data to display  Clinical Course as of 07/11/24 0526  Sun Jul 11, 2024  0143 Consult to cardiology fellow Dr. Orlando who is amenable to consulting on the patient in the ED and making recommendations.   [RS]  [id number] Consult from cardiology fellow who is requesting medical admission with cardiology consultation for further optimization. I appreciate his collaboration in the care of this patient.  [RS]  [phone] Consult to  Dr. Mannie, hospitalist, who is amenable to admitting the patient to her service.  Appreciate your collaboration of care this patient. [RS]    Clinical Course User Index [RS] Venissa Nappi, Pleasant SAUNDERS, PA-C             HEART Score: 7                    Medical Decision Making 83 y/o male who presents with CP.  Vitals normal on intake.  Cardiopulmonary dam unremarkable, abdominal exam is benign.  Patient without lower extremity swelling.  Patient overall very well-appearing.  DDx includes not limited to ACS, PE, pleural effusion, pneumonia, pneumothorax, dysrhythmia, GERD, reactive airways, MSK pain.  Amount and/or Complexity of Data Reviewed Labs: ordered.    Details: CBC with neutropenia of 3.6, anemia with hemoglobin 10.8, BMP with hypokalemia 3.3, backslash panel is normal, troponin is 6, negative at time of intake, pending delta  troponin. Radiology: ordered.    Details:   Chest x-ray is negative for acute cardiopulmonary disease ECG/medicine tests:     Details:  EKG has right bundle branch block with normal sinus rhythm, T wave changes, stable since last EKG 3 weeks ago.  Risk Decision regarding hospitalization.   Heart score 7, recent NSTEMI and concerning symptoms today with exertional chest pain resolved only with nitroglycerin .  Do feel he will benefit from admission to the hospital for further observation.  Consult cardiology pending at this time.  Patient admitted to hospital medicine with cardiology consult ongoing, as above.  Osama voiced understanding of her medical evaluation and treatment plan. Each of their questions answered to their expressed satisfaction.  He is amenable to plan for admission at this time. He remains CP free.   This chart was dictated using voice recognition software, Dragon. Despite the best efforts of this provider to proofread and correct errors, errors may still occur which can change documentation meaning.      Final diagnoses:  Chest pain, unspecified type    ED Discharge Orders     None          Bobette Pleasant SAUNDERS, PA-C 07/11/24 0526    Haze Lonni PARAS, MD 07/11/24 [phone]

## 2024-07-10 NOTE — ED Triage Notes (Signed)
 Pt arrived with [facility] for centralized, nonradiating chest pain after arguing with his son. EMS gave 1 nitroglycerin  with improvement of chest pain. Denies chest pain at this time, localized pain to epigastric region  EMS VS pulse 80, bp 110/0

## 2024-07-10 NOTE — ED Notes (Signed)
 Contacted lab to add on hepatic function

## 2024-07-10 NOTE — ED Notes (Signed)
 Unsuccessful venipuncture...KM

## 2024-07-11 DIAGNOSIS — I25118 Atherosclerotic heart disease of native coronary artery with other forms of angina pectoris: Secondary | ICD-10-CM

## 2024-07-11 DIAGNOSIS — R079 Chest pain, unspecified: Secondary | ICD-10-CM

## 2024-07-11 DIAGNOSIS — I2510 Atherosclerotic heart disease of native coronary artery without angina pectoris: Secondary | ICD-10-CM | POA: Diagnosis present

## 2024-07-11 DIAGNOSIS — E785 Hyperlipidemia, unspecified: Secondary | ICD-10-CM

## 2024-07-11 DIAGNOSIS — I10 Essential (primary) hypertension: Secondary | ICD-10-CM | POA: Diagnosis not present

## 2024-07-11 LAB — TROPONIN I (HIGH SENSITIVITY): Troponin I (High Sensitivity): 5 ng/L (ref <18)

## 2024-07-11 LAB — CBG MONITORING, ED: Glucose-Capillary: 79 mg/dL (ref 70–99)

## 2024-07-11 LAB — HEMOGLOBIN A1C
Hgb A1c MFr Bld: 5.6 % (ref 4.8–5.6)
Mean Plasma Glucose: 114.02 mg/dL

## 2024-07-11 LAB — GLUCOSE, CAPILLARY
Glucose-Capillary: 108 mg/dL — ABNORMAL HIGH (ref 70–99)
Glucose-Capillary: 332 mg/dL — ABNORMAL HIGH (ref 70–99)
Glucose-Capillary: 115 mg/dL — ABNORMAL HIGH (ref 70–99)

## 2024-07-11 MED ORDER — RANOLAZINE ER 500 MG PO TB12: 1000.0000 mg | ORAL_TABLET | Freq: Two times a day (BID) | ORAL | Status: DC

## 2024-07-11 MED ORDER — FERROUS SULFATE 325 (65 FE) MG PO TABS
325.0000 mg | ORAL_TABLET | Freq: Every day | ORAL | Status: DC
  Administered 2024-07-12 – 2024-07-14 (×3): 325 mg via ORAL
  Filled 2024-07-11 (×3): qty 1

## 2024-07-11 MED ORDER — MORPHINE SULFATE (PF) 2 MG/ML IV SOLN
2.0000 mg | INTRAVENOUS | Status: DC | PRN
  Administered 2024-07-12: 2 mg via INTRAVENOUS
  Filled 2024-07-11: qty 1

## 2024-07-11 MED ORDER — ISOSORBIDE MONONITRATE ER 60 MG PO TB24
60.0000 mg | ORAL_TABLET | Freq: Every day | ORAL | Status: DC
  Administered 2024-07-11 – 2024-07-14 (×4): 60 mg via ORAL
  Filled 2024-07-11: qty 1
  Filled 2024-07-11: qty 2
  Filled 2024-07-11 (×2): qty 1

## 2024-07-11 MED ORDER — CLOPIDOGREL BISULFATE 75 MG PO TABS
75.0000 mg | ORAL_TABLET | Freq: Every day | ORAL | Status: DC
  Administered 2024-07-11 – 2024-07-14 (×3): 75 mg via ORAL
  Filled 2024-07-11 (×3): qty 1

## 2024-07-11 MED ORDER — ACETAMINOPHEN 325 MG PO TABS: 650.0000 mg | ORAL_TABLET | Freq: Four times a day (QID) | ORAL | Status: DC | PRN

## 2024-07-11 MED ORDER — ACETAMINOPHEN 650 MG RE SUPP: 650.0000 mg | Freq: Four times a day (QID) | RECTAL | Status: DC | PRN

## 2024-07-11 MED ORDER — RANOLAZINE ER 500 MG PO TB12
500.0000 mg | ORAL_TABLET | Freq: Two times a day (BID) | ORAL | Status: DC
Start: 2024-07-11 — End: 2024-07-14
  Administered 2024-07-11 – 2024-07-14 (×7): 500 mg via ORAL
  Filled 2024-07-11 (×7): qty 1

## 2024-07-11 MED ORDER — ONDANSETRON HCL 4 MG PO TABS: 4.0000 mg | ORAL_TABLET | Freq: Four times a day (QID) | ORAL | Status: DC | PRN

## 2024-07-11 MED ORDER — OXYCODONE HCL 5 MG PO TABS: 5.0000 mg | ORAL_TABLET | ORAL | Status: DC | PRN

## 2024-07-11 MED ORDER — ONDANSETRON HCL 4 MG/2ML IJ SOLN: 4.0000 mg | Freq: Four times a day (QID) | INTRAMUSCULAR | Status: DC | PRN

## 2024-07-11 MED ORDER — ALLOPURINOL 100 MG PO TABS
100.0000 mg | ORAL_TABLET | Freq: Every day | ORAL | Status: DC | PRN
  Administered 2024-07-13 – 2024-07-14 (×2): 100 mg via ORAL
  Filled 2024-07-11 (×2): qty 1

## 2024-07-11 MED ORDER — AMLODIPINE BESYLATE 5 MG PO TABS
5.0000 mg | ORAL_TABLET | Freq: Every day | ORAL | Status: DC
  Administered 2024-07-11 – 2024-07-14 (×4): 5 mg via ORAL
  Filled 2024-07-11 (×4): qty 1

## 2024-07-11 MED ORDER — IRBESARTAN 300 MG PO TABS: 300.0000 mg | ORAL_TABLET | Freq: Every day | ORAL | Status: DC

## 2024-07-11 MED ORDER — IRBESARTAN 300 MG PO TABS
150.0000 mg | ORAL_TABLET | Freq: Every day | ORAL | Status: DC
  Administered 2024-07-11 – 2024-07-14 (×4): 150 mg via ORAL
  Filled 2024-07-11 (×4): qty 1

## 2024-07-11 MED ORDER — ASPIRIN 81 MG PO TBEC
81.0000 mg | DELAYED_RELEASE_TABLET | Freq: Every day | ORAL | Status: DC
  Administered 2024-07-11 – 2024-07-14 (×4): 81 mg via ORAL
  Filled 2024-07-11 (×4): qty 1

## 2024-07-11 MED ORDER — POLYETHYLENE GLYCOL 3350 17 G PO PACK: 17.0000 g | PACK | Freq: Every day | ORAL | Status: DC | PRN

## 2024-07-11 MED ORDER — ATORVASTATIN CALCIUM 80 MG PO TABS
80.0000 mg | ORAL_TABLET | Freq: Every day | ORAL | Status: DC
  Administered 2024-07-11 – 2024-07-14 (×4): 80 mg via ORAL
  Filled 2024-07-11 (×4): qty 1

## 2024-07-11 MED ORDER — INSULIN ASPART 100 UNIT/ML IJ SOLN
0.0000 [IU] | Freq: Three times a day (TID) | INTRAMUSCULAR | Status: DC
  Filled 2024-07-11: qty 1

## 2024-07-11 MED ORDER — ENOXAPARIN SODIUM 40 MG/0.4ML IJ SOSY
40.0000 mg | PREFILLED_SYRINGE | INTRAMUSCULAR | Status: DC
  Administered 2024-07-11 – 2024-07-13 (×3): 40 mg via SUBCUTANEOUS
  Filled 2024-07-11 (×3): qty 0.4

## 2024-07-11 MED ORDER — SODIUM CHLORIDE 0.9% FLUSH
3.0000 mL | Freq: Two times a day (BID) | INTRAVENOUS | Status: DC
  Administered 2024-07-11 – 2024-07-14 (×6): 3 mL via INTRAVENOUS

## 2024-07-11 NOTE — Assessment & Plan Note (Deleted)
-   Patient presents back with recurrent chest pain after becoming stressed out at home during son's argument with his SO -Recent left heart cath performed 06/11/2024 with 2 DES placed.  Mid LAD and first diagonal.  Third lesion at mid Cx 99% stenosis down to 50% stenosis after balloon angioplasty.  Fourth lesion first marginal 99% stenosis unable to be treated with balloon angioplasty.  Distal circumflex 100% stenosis beyond second marginal - Has been on aspirin /Plavix  - Nitrates were initially held off after cath due to patient being on Viagra.  Now has been started on Imdur  and Ranexa  per cardiology and patient okay with remaining off Viagra - If refractory chest pain despite medical management, potentially cath at that time

## 2024-07-11 NOTE — ED Notes (Signed)
 CCMD contacted.

## 2024-07-11 NOTE — Assessment & Plan Note (Signed)
-   Imdur  and Ranexa  started per cardiology for chest pain - See CAD plan above also

## 2024-07-11 NOTE — Assessment & Plan Note (Signed)
 Echocardiogram with preserved LV systolic function with EF 65 to 70%, possible basal inferior and inferolateral hypokinesis. RV systolic function preserved, trivial pericardial effusion, no significant valvular disease.   Clinically euvolemic with no signs of exacerbation.   Plan to continue blood pressure control with valsartan .  Not on diuretic therapy.  Follow up as outpatient.

## 2024-07-11 NOTE — Assessment & Plan Note (Signed)
 Replete as needed

## 2024-07-11 NOTE — Assessment & Plan Note (Signed)
 Continue blood pressure control with valsartan  and amlodipine .

## 2024-07-11 NOTE — Plan of Care (Signed)
 The patient 83 yr old male with PMH of CVA, hypertension, chronic diastolic heart failure who presented with NSTEMI and was hospitalized 06/07/2024 to 06/18/2024.  Patient had left heart cath and was found to have lesion 1 mid LAD 90% stenosis.  Drug-eluting stent was successfully placed and that LAD lesion.  Lesion 2 first diagonal demonstrated 90% stenosis the patient had a drug-eluting stent placed there as well.  Lesion 3 mid circumflex lesion was 99% stenosis at the time.  And that some difficulty getting across that area.  Balloon angioplasty was performed using ballooning which and postintervention 50% residual stenosis was demonstrated.  Patient also had a fourth lesion in the first marginal which is 99% stenosis and balloon angioplasty was performed there was about.  However postintervention it was still 99% residual stenosis at the time.  Patient was placed on GDMT medications and subsequently discharged.  Aspirin  81 mg clopidogrel  75 mg daily for 12 months.  CABG was discussed at that time with a felt that patient was a poor candidate as he is wheelchair-bound and therefore a poor functional status likely poor healing.  Patient returns to the emergency room with similar complaint of chest pain at this time.  Cardiology did see the patient recommended admission for NSTEMI.  They would consult on the patient unsure of whether they will take patient back for further cardiac catheterization at this time.  Currently they recommend he continue dual antiplatelet with aspirin  Plavix , and atorvastatin .  Blood pressure medications and as needed nitro.  Recommend making patient n.p.o. Sunday just in case they decide to take patient for cardiac catheterization.  Hospitalist service was asked to admit this patient.

## 2024-07-11 NOTE — Consult Note (Signed)
 Cardiology Consultation  Patient ID: Luis Poole MRN: [id number]; DOB: December 02, 1940  Admit date: 07/10/2024 Date of Consult: 07/11/2024  PCP:  Pura Lenis, MD   [facility] Providers Cardiologist:  Soyla DELENA Merck, MD     Patient Profile: Luis Poole is a 83 y.o. male with a hx of CAD with recent multivessel PCI with un revascularized OM1 99% lesion, htn, hld, chronic back pain who is being seen 07/11/2024 for the evaluation of chest pain at the request of Dr. Haze.  History of Present Illness: Luis Poole initially presented on 06/11/24 with chest pain and was admitted for an NSTEMI. He was found to have multivessel disease but was felt to be a poor surgical candidate due to his poor functional status and deconditioning. He underwent multivessel PCI with DES placed to his mLAD, D1, and mLCx. Unfortunately, he had a 99% OM lesion that was unable to cross with a wire. He was discharged on antianginals.   He now presents with chest pain again in the setting of raking some leaves. The chest pain did not subside or improve with rest but did begin to improve after some nitroglycerin  after EMS arrived on scene.   In the ED, he has continued to have some chest tingling sensation, but the pain has improved. The patient notes that he takes Viagra every day. He states he is not interested in taking additional medications.   Past Medical History:  Diagnosis Date   Anemia, unspecified 06/08/2013   Cataract    CHF (congestive heart failure) (HCC)    Chronic kidney disease    Diabetes mellitus    Foot drop, left foot    AFO   Hypertension    PAD (peripheral artery disease)    S/P lumbar fusion    Sequelae of cerebral infarction    Stroke [facility])     Past Surgical History:  Procedure Laterality Date   CATARACT EXTRACTION     CORONARY STENT INTERVENTION N/A 06/11/2024   Procedure: CORONARY STENT INTERVENTION;  Surgeon: Anner Lenis ORN, MD;  Location: [facility] INVASIVE CV LAB;   Service: Cardiovascular;  Laterality: N/A;   EYE SURGERY     KNEE SURGERY  2006   LEFT HEART CATH AND CORONARY ANGIOGRAPHY N/A 06/08/2024   Procedure: LEFT HEART CATH AND CORONARY ANGIOGRAPHY;  Surgeon: Ladona Heinz, MD;  Location: [facility] INVASIVE CV LAB;  Service: Cardiovascular;  Laterality: N/A;   NECK SURGERY  2012   SPINE SURGERY  2009   TONSILECTOMY, ADENOIDECTOMY, BILATERAL MYRINGOTOMY AND TUBES       Home Medications:  Prior to Admission medications   Medication Sig Start Date End Date Taking? Authorizing Provider  allopurinol  (ZYLOPRIM ) 100 MG tablet Take 100 mg by mouth daily as needed (for gout flareups).    [provider]  amLODipine  (NORVASC ) 10 MG tablet Take 0.5 tablets (5 mg total) by mouth daily. 08/12/23   Danford, Lonni SHAUNNA, MD  aspirin  EC 81 MG tablet Take 1 tablet (81 mg total) by mouth daily. 06/12/24   Drusilla Sabas RAMAN, MD  atorvastatin  (LIPITOR ) 80 MG tablet Take 1 tablet (80 mg total) by mouth daily. 06/13/24   Drusilla Sabas RAMAN, MD  clopidogrel  (PLAVIX ) 75 MG tablet Take 1 tablet (75 mg total) by mouth daily with breakfast. 06/13/24   Drusilla Sabas RAMAN, MD  colchicine  0.6 MG tablet Take 1 tablet (0.6 mg total) daily by mouth. Patient taking differently: Take 0.6 mg by mouth daily as needed (gout pain). 06/29/17  Dolan Mateo Larger, MD  cyclobenzaprine  (FLEXERIL ) 5 MG tablet Take 1 tablet (5 mg total) by mouth 2 (two) times daily as needed for muscle spasms. 05/25/24   Trine Raynell Moder, MD  ferrous sulfate  325 (65 FE) MG tablet Take 325 mg by mouth daily with breakfast.    [provider]  lidocaine  (LIDODERM ) 5 % Place 1 patch onto the skin daily. Remove & Discard patch within 12 hours or as directed by MD 03/31/24   Barrett, Jamie N, PA-C  polyethylene glycol (MIRALAX  / GLYCOLAX ) 17 g packet Take 17 g by mouth daily as needed for mild constipation. 11/29/22   Rizwan, Saima, MD  valsartan  (DIOVAN ) 320 MG tablet Take 320 mg by mouth daily. 03/29/24    [provider]    Scheduled Meds:  Continuous Infusions:  PRN Meds:   Allergies:    Allergies  Allergen Reactions   Ibuprofen  Itching   Lisinopril Other (See Comments)    Swollen lips   Pregabalin  Swelling    Recently filled 11/24 for 90ds and reports taking on 08/11/23   Gabapentin  Other (See Comments)    Dizziness    Social History:   Social History   Socioeconomic History   Marital status: Single    Spouse name: Not on file   Number of children: 3   Years of education: Not on file   Highest education level: 12th grade  Occupational History    Comment: housekeeping  Tobacco Use   Smoking status: Former   Smokeless tobacco: Former  Building Services Engineer status: Never Used  Substance and Sexual Activity   Alcohol use: No   Drug use: No   Sexual activity: Not on file  Other Topics Concern   Not on file  Social History Narrative   Lives with his children   Left handed   Caffeine: occas 1 soda mixed w water .    Social Drivers of Corporate Investment Banker Strain: Low Risk  (09/25/2023)   Received from [facility]   Overall Financial Resource Strain (CARDIA)    Difficulty of Paying Living Expenses: Not hard at all  Food Insecurity: No Food Insecurity (06/08/2024)   Hunger Vital Sign    Worried About Running Out of Food in the Last Year: Never true    Ran Out of Food in the Last Year: Never true  Transportation Needs: No Transportation Needs (06/08/2024)   PRAPARE - Administrator, Civil Service (Medical): No    Lack of Transportation (Non-Medical): No  Physical Activity: Unknown (06/26/2023)   Received from [facility]   Exercise Vital Sign    On average, how many days per week do you engage in moderate to strenuous exercise (like a brisk walk)?: 0 days    Minutes of Exercise per Session: Not on file  Stress: Stress Concern Present (06/26/2023)   Received from [facility] of Occupational [facility] - Occupational  Stress Questionnaire    Feeling of Stress : Rather much  Social Connections: Moderately Isolated (06/08/2024)   Social Connection and Isolation Panel    Frequency of Communication with Friends and Family: More than three times a week    Frequency of Social Gatherings with Friends and Family: More than three times a week    Attends Religious Services: More than 4 times per year    Active Member of [employer] or Organizations: No    Attends Banker Meetings: Never    Marital  Status: Widowed  Intimate Partner Violence: Not At Risk (06/08/2024)   Humiliation, Afraid, Rape, and Kick questionnaire    Fear of Current or Ex-Partner: No    Emotionally Abused: No    Physically Abused: No    Sexually Abused: No    Family History:   Family History  Problem Relation Age of Onset   Stroke Mother    Heart attack Maternal Grandfather    Heart attack Paternal Grandfather      ROS:  Please see the history of present illness.  All other ROS reviewed and negative.     Physical Exam/Data: Vitals:   07/10/24 2142 07/11/24 0110 07/11/24 0200 07/11/24 0222  BP: 111/69 (!) 162/86 (!) 150/84   Pulse: 74 65 62   Resp: 16 14 11    Temp: 98.2 F (36.8 C)   98.5 F (36.9 C)  TempSrc:    Oral  SpO2: 100% 100% 100%    No intake or output data in the 24 hours ending 07/11/24 0313    06/18/2024    4:38 AM 06/17/2024    5:00 AM 06/16/2024    3:28 AM  Last 3 Weights  Weight (lbs) 180 lb 12.4 oz 181 lb 14.1 oz 179 lb 0.2 oz  Weight (kg) 82 kg 82.5 kg 81.2 kg     There is no height or weight on file to calculate BMI.  General:  Well nourished, well developed, in no acute distress HEENT: normal Neck: no JVD Vascular: No carotid bruits; Distal pulses 2+ bilaterally Cardiac:  normal S1, S2; RRR; no murmur Lungs:  clear to auscultation bilaterally, no wheezing, rhonchi or rales  Abd: soft, nontender, no hepatomegaly  Ext: no edema Musculoskeletal:  No deformities, BUE and BLE strength normal  and equal Skin: warm and dry  Neuro:  no focal abnormalities noted Psych:  Normal affect   EKG:  The EKG was personally reviewed and demonstrates:  SR, RBBB, nonspecific STT wave abnl Telemetry:  Telemetry was personally reviewed and demonstrates:  SR  Relevant CV Studies: Coronary angiogram 06/11/2024   Lesion #1 mid LAD lesion is 90% stenosed.   A drug-eluting stent was successfully placed using a STENT SYNERGY XD 2.50X16 deployed to 2.75 mm. Post intervention, there is a 0% residual stenosis.  TIMI-3 flow maintained.   Lesion #2 1st Diag lesion is 90% stenosed.  TIMI-3 flow   A drug-eluting stent was successfully placed using a STENT SYNERGY XD 2.25X12 => deployed to 2.4 mm.. Post intervention, there is a 0% residual stenosis.  TIMI-3 flow maintained   Lesion #3 mid Cx lesion is 99% stenosed.   Balloon angioplasty was performed using a BALLOON EMERGE MR PUSH 1.5X15. Post intervention, there is a 50% residual stenosis.  TIMI-3 flow maintained   Lesion #4 1st Mrg lesion is 99% stenosed.   Balloon angioplasty was attempted using a BALLOON EMERGE MR PUSH 1.5X8, however were not able to cross the lesion.  Post intervention, there is a 99% residual stenosis. . Was not able to advance a second wire across the lesion.   Dist Cx lesion is 100% stenosed beyond 2nd Mrg.   -----------------------------------------------------------------------------------------------   Anticipated discharge date to be determined.   Continue to titrate GDMT Monitor for symptoms, could consider reattempt at PCI of the OM using a different guide catheter and perhaps a more robust wire.   Recommend uninterrupted dual antiplatelet therapy with Aspirin  81mg  daily and Clopidogrel  75mg  daily for a minimum of 12 months (ACS-Class I recommendation).  Would continue Plavix  monotherapy after the first year but okay to interrupt.  TTE 06/08/2024 IMPRESSIONS   1. Suboptimal evaluation of the left ventricular wall motion. Cannot   exclude basal inferior and inferolateral hypokinesis, but otherwise  appears to have normal wall motion. Left ventricular ejection fraction, by  estimation, is 65 to 70%. The left  ventricle has normal function. Left ventricular endocardial border not  optimally defined to evaluate regional wall motion. Left ventricular  diastolic function could not be evaluated.   2. Right ventricular systolic function is normal. The right ventricular  size is normal.   3. The aortic valve is tricuspid. There is mild calcification of the  aortic valve. Aortic valve regurgitation is not visualized. Aortic valve  sclerosis/calcification is present, without any evidence of aortic  stenosis.   Laboratory Data: High Sensitivity Troponin:   Recent Labs  Lab 07/10/24 2150 07/10/24 2355  TROPONINIHS 6 5     Chemistry Recent Labs  Lab 07/10/24 2150  NA 138  K 3.3*  CL 105  CO2 21*  GLUCOSE 133*  BUN 15  CREATININE 1.15  CALCIUM  9.2  GFRNONAA >60  ANIONGAP 12    Recent Labs  Lab 07/10/24 2150  PROT 7.3  ALBUMIN 3.4*  AST 18  ALT 12  ALKPHOS 70  BILITOT 0.4   Hematology Recent Labs  Lab 07/10/24 2150  WBC 3.6*  RBC 3.83*  HGB 10.8*  HCT 33.9*  MCV 88.5  MCH 28.2  MCHC 31.9  RDW 13.9  PLT 147*   Radiology/Studies:  DG Chest 2 View Result Date: 07/10/2024 EXAM: 2 VIEW(S) XRAY OF THE CHEST 07/10/2024 10:14:00 PM COMPARISON: 06/07/2024 CLINICAL HISTORY: cp cp FINDINGS: LINES, TUBES AND DEVICES: Spinal cord stimulator remains in place, unchanged. LUNGS AND PLEURA: No focal pulmonary opacity. No pleural effusion. No pneumothorax. HEART AND MEDIASTINUM: No acute abnormality of the cardiac and mediastinal silhouettes. BONES AND SOFT TISSUES: No acute osseous abnormality. IMPRESSION: 1. No acute cardiopulmonary process. Electronically signed by: Franky Crease MD 07/10/2024 10:28 PM EST RP Workstation: [id number]   Assessment and Plan: Stable angina  Coronary artery disease with recent  PCI to mLAD, D1, mLCx OM1 99% lesion Hypertension Hyperlipidemia Diabetes mellitus  The patient presents with angina that occurred with exertion and did not resolve with rest. He did have improvement of the pain with nitroglycerin . Initially, I recommended a conservative approach to the patient with anti-anginal titration as he is not on a nitrate. He was initially receptive to this plan but then stated he did not want to take additional medications. He has a HR in the low 60s so I am not sure if he would tolerate beta blocker. He notes that he takes Viagra daily so long acting nitrate would not be a good option either. In reviewing the coronary angiogram, the OM lesion could possibly be amendable to PCI. The patient states his preference would be for repeat PCI attempt rather than medication trial.  - Continue DAPT with aspirin  and Plavix  - Continue atorvastatin   - Continue blood pressure medications - Prn nitroglycerin  for chest pain episodes - NPO Sunday night   Risk Assessment/Risk Scores:     For questions or updates, please contact [facility] Please consult [email or web address] for contact info under    Signed, Jerrell DELENA Orchard, MD  07/11/2024 3:13 AM

## 2024-07-11 NOTE — Consult Note (Signed)
 Cardiology Consultation:   Patient ID: Luis Poole MRN: [id number]; DOB: February 28, 1941  Admit date: 07/10/2024 Date of Consult: 07/11/2024  Primary Care Provider: Pura Lenis, MD [facility] Cardiologist: Luis DELENA Merck, MD  [facility] Electrophysiologist:  None   Patient Profile:   Luis Poole is a 83 y.o. 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  History of Present Illness:   Luis Poole was admitted 06/07/2024 with chest pain and treated for NSTEMI.  He was found to have multivessel disease and his age/comorbidities made him a poor CABG candidate.  He underwent successful PCI with DES to the mLAD, D1 and mLCx.  There was additionally a 99% OM lesion that was unable to be crossed at that time.  It was recommended that antianginal therapy first be uptitrated prior to reattempt at PCI of the OM.  Past Medical History:  Diagnosis Date   Anemia, unspecified 06/08/2013   Cataract    CHF (congestive heart failure) (HCC)    Chronic kidney disease    Diabetes mellitus    Foot drop, left foot    AFO   Hypertension    PAD (peripheral artery disease)    S/P lumbar fusion    Sequelae of cerebral infarction    Stroke [facility])    Past Surgical History:  Procedure Laterality Date   CATARACT EXTRACTION     CORONARY STENT INTERVENTION Poole/A 06/11/2024   Procedure: CORONARY STENT INTERVENTION;  Surgeon: Luis Poole ORN, MD;  Location: [facility] INVASIVE CV LAB;  Service: Cardiovascular;  Laterality: Poole/A;   EYE SURGERY     KNEE SURGERY  2006   LEFT HEART CATH AND CORONARY ANGIOGRAPHY Poole/A 06/08/2024   Procedure: LEFT HEART CATH AND CORONARY ANGIOGRAPHY;  Surgeon: Luis Heinz, MD;  Location: [facility] INVASIVE CV LAB;  Service: Cardiovascular;  Laterality: Poole/A;   NECK SURGERY  2012   SPINE SURGERY  2009   TONSILECTOMY, ADENOIDECTOMY, BILATERAL MYRINGOTOMY AND TUBES      Home Medications:  Prior to Admission  medications   Medication Sig Start Date End Date Taking? Authorizing Provider  allopurinol  (ZYLOPRIM ) 100 MG tablet Take 100 mg by mouth daily as needed (for gout flareups).   Yes [provider]  amLODipine  (NORVASC ) 10 MG tablet Take 0.5 tablets (5 mg total) by mouth daily. 08/12/23  Yes Danford, Lonni SHAUNNA, MD  aspirin  EC 81 MG tablet Take 1 tablet (81 mg total) by mouth daily. 06/12/24  Yes Luis Luis RAMAN, MD  atorvastatin  (LIPITOR ) 80 MG tablet Take 1 tablet (80 mg total) by mouth daily. 06/13/24  Yes Luis Luis RAMAN, MD  clopidogrel  (PLAVIX ) 75 MG tablet Take 1 tablet (75 mg total) by mouth daily with breakfast. 06/13/24  Yes Luis, Luis RAMAN, MD  colchicine  0.6 MG tablet Take 1 tablet (0.6 mg total) daily by mouth. Patient taking differently: Take 0.6 mg by mouth daily as needed (gout pain). 06/29/17  Yes Dolan Mateo Larger, MD  cyclobenzaprine  (FLEXERIL ) 5 MG tablet Take 1 tablet (5 mg total) by mouth 2 (two) times daily as needed for muscle spasms. 05/25/24  Yes Cardama, Raynell Moder, MD  ferrous sulfate  325 (65 FE) MG tablet Take 325 mg by mouth daily with breakfast.   Yes [provider]  lidocaine  (LIDODERM ) 5 % Place 1 patch onto the skin daily. Remove & Discard patch within 12 hours or as directed by MD 03/31/24  Yes Barrett, Luis N, PA-C  meclizine  (ANTIVERT ) 12.5 MG tablet Take 12.5 mg by mouth 3 (three) times daily as needed for dizziness.   Yes [provider]  oxycodone  (OXY-IR) 5 MG capsule Take 5 mg by mouth every 4 (four) hours as needed for pain.   Yes [provider]  polyethylene glycol (MIRALAX  / GLYCOLAX ) 17 g packet Take 17 g by mouth daily as needed for mild constipation. 11/29/22  Yes Rizwan, Saima, MD  potassium chloride  (KLOR-CON  M) 10 MEQ tablet Take 10 mEq by mouth daily. 07/06/24  Yes [provider]  valsartan  (DIOVAN ) 320 MG tablet Take 320 mg by mouth daily. 03/29/24  Yes [provider]    Inpatient  Medications: Scheduled Meds:  Continuous Infusions:  PRN Meds:   Allergies:    Allergies  Allergen Reactions   Ibuprofen  Itching   Lisinopril Other (See Comments)    Swollen lips   Pregabalin  Swelling    Recently filled 11/24 for 90ds and reports taking on 08/11/23   Gabapentin  Other (See Comments)    Dizziness    Social History:   Social History   Socioeconomic History   Marital status: Single    Spouse name: Not on file   Number of children: 3   Years of education: Not on file   Highest education level: 12th grade  Occupational History    Comment: housekeeping  Tobacco Use   Smoking status: Former   Smokeless tobacco: Former  Building Services Engineer status: Never Used  Substance and Sexual Activity   Alcohol use: No   Drug use: No   Sexual activity: Not on file  Other Topics Concern   Not on file  Social History Narrative   Lives with his children   Left handed   Caffeine: occas 1 soda mixed w water .    Social Drivers of Corporate Investment Banker Strain: Low Risk  (09/25/2023)   Received from [facility]   Overall Financial Resource Strain (CARDIA)    Difficulty of Paying Living Expenses: Not hard at all  Food Insecurity: No Food Insecurity (06/08/2024)   Hunger Vital Sign    Worried About Running Out of Food in the Last Year: Never true    Ran Out of Food in the Last Year: Never true  Transportation Needs: No Transportation Needs (06/08/2024)   PRAPARE - Administrator, Civil Service (Medical): No    Lack of Transportation (Non-Medical): No  Physical Activity: Unknown (06/26/2023)   Received from [facility]   Exercise Vital Sign    On average, how many days per week do you engage in moderate to strenuous exercise (like a brisk walk)?: 0 days    Minutes of Exercise per Session: Not on file  Stress: Stress Concern Present (06/26/2023)   Received from [facility] - Occupational Stress  Questionnaire    Feeling of Stress : Rather much  Social Connections: Moderately Isolated (06/08/2024)   Social Connection and Isolation Panel    Frequency of Communication with Friends and Family: More than three times a week    Frequency of Social Gatherings with Friends and Family: More than three times a week    Attends Religious Services: More than 4 times per year    Active Member of [employer] or Organizations: No    Attends Banker Meetings: Never    Marital Status: Widowed  Intimate Partner Violence: Not At Risk (06/08/2024)   Humiliation, Afraid, Rape, and  Kick questionnaire    Fear of Current or Ex-Partner: No    Emotionally Abused: No    Physically Abused: No    Sexually Abused: No    Family History:    Family History  Problem Relation Age of Onset   Stroke Mother    Heart attack Maternal Grandfather    Heart attack Paternal Grandfather     ROS:  Review of Systems: [y] = yes, [ ]  = no      General: Weight gain [ ] ; Weight loss [ ] ; Anorexia [ ] ; Fatigue [ ] ; Fever [ ] ; Chills [ ] ; Weakness [ ]    Cardiac: Chest pain/pressure [x] ; Resting SOB [ ] ; Exertional SOB [ ] ; Orthopnea [ ] ; Pedal Edema [ ] ; Palpitations [ ] ; Syncope [ ] ; Presyncope [ ] ; Paroxysmal nocturnal dyspnea [ ]    Pulmonary: Cough [ ] ; Wheezing [ ] ; Hemoptysis [ ] ; Sputum [ ] ; Snoring [ ]    GI: Vomiting [ ] ; Dysphagia [ ] ; Melena [ ] ; Hematochezia [ ] ; Heartburn [ ] ; Abdominal pain [ ] ; Constipation [ ] ; Diarrhea [ ] ; BRBPR [ ]    GU: Hematuria [ ] ; Dysuria [ ] ; Nocturia [ ]  Vascular: Pain in legs with walking [ ] ; Pain in feet with lying flat [ ] ; Non-healing sores [ ] ; Stroke [ ] ; TIA [ ] ; Slurred speech [ ] ;   Neuro: Headaches [ ] ; Vertigo [ ] ; Seizures [ ] ; Paresthesias [ ] ;Blurred vision [ ] ; Diplopia [ ] ; Vision changes [ ]    Ortho/Skin: Arthritis [ ] ; Joint pain [ ] ; Muscle pain [ ] ; Joint swelling [ ] ; Back Pain [ ] ; Rash [ ]    Psych: Depression [ ] ; Anxiety [ ]    Heme: Bleeding problems [  ]; Clotting disorders [ ] ; Anemia [ ]    Endocrine: Diabetes [ ] ; Thyroid  dysfunction [ ]    Physical Exam/Data:   Vitals:   07/11/24 0600 07/11/24 0611 07/11/24 0700 07/11/24 0730  BP: (!) 174/79  (!) 144/63 (!) 147/77  Pulse: (!) 56  (!) 59 62  Resp: 10  16 20   Temp:  97.9 F (36.6 C)    TempSrc:  Oral    SpO2: 100%  100% 100%    Intake/Output Summary (Last 24 hours) at 07/11/2024 0802 Last data filed at 07/11/2024 0739 Gross per 24 hour  Intake --  Output 300 ml  Net -300 ml      06/18/2024    4:38 AM 06/17/2024    5:00 AM 06/16/2024    3:28 AM  Last 3 Weights  Weight (lbs) 180 lb 12.4 oz 181 lb 14.1 oz 179 lb 0.2 oz  Weight (kg) 82 kg 82.5 kg 81.2 kg     There is no height or weight on file to calculate BMI.  General:  Well nourished, well developed, in no acute distress HEENT: normal Neck: no JVD Vascular: No carotid bruits; FA pulses 2+ bilaterally without bruits  Cardiac:  normal S1, S2; RRR; no murmur  Lungs:  clear to auscultation bilaterally, no wheezing, rhonchi or rales  Ext: no edema Skin: warm and dry  Neuro:  CNs 2-12 intact, no focal abnormalities noted Psych:  Normal affect   EKG:  The EKG was personally reviewed and demonstrates: NSR 70, PR 200, QRS 148, QT/c 430/464, RBBB Telemetry:  Telemetry was personally reviewed and demonstrates:  NSR 70s-80s   Relevant CV Studies:  Coronary angiography/PCI Result date: 06/11/24   Lesion #1 mid LAD lesion is 90% stenosed.  A drug-eluting stent was successfully placed using a STENT SYNERGY XD 2.50X16 deployed to 2.75 mm. Post intervention, there is a 0% residual stenosis.  TIMI-3 flow maintained.   Lesion #2 1st Diag lesion is 90% stenosed.  TIMI-3 flow   A drug-eluting stent was successfully placed using a STENT SYNERGY XD 2.25X12 => deployed to 2.4 mm.. Post intervention, there is a 0% residual stenosis.  TIMI-3 flow maintained   Lesion #3 mid Cx lesion is 99% stenosed.   Balloon angioplasty was  performed using a BALLOON EMERGE MR PUSH 1.5X15. Post intervention, there is a 50% residual stenosis.  TIMI-3 flow maintained   Lesion #4 1st Mrg lesion is 99% stenosed.   Balloon angioplasty was attempted using a BALLOON EMERGE MR PUSH 1.5X8, however were not able to cross the lesion.  Post intervention, there is a 99% residual stenosis. . Was not able to advance a second wire across the lesion.   Dist Cx lesion is 100% stenosed beyond 2nd Mrg.   -----------------------------------------------------------------------------------------------   Anticipated discharge date to be determined.   Continue to titrate GDMT Monitor for symptoms, could consider reattempt at PCI of the OM using a different guide catheter and perhaps a more robust wire.   Recommend uninterrupted dual antiplatelet therapy with Aspirin  81mg  daily and Clopidogrel  75mg  daily for a minimum of 12 months (ACS-Class I recommendation).   Would continue Plavix  monotherapy after the first year but okay to interrupt.  Laboratory Data:  High Sensitivity Troponin:   Recent Labs  Lab 07/10/24 2150 07/10/24 2355  TROPONINIHS 6 5     Chemistry Recent Labs  Lab 07/10/24 2150  NA 138  K 3.3*  CL 105  CO2 21*  GLUCOSE 133*  BUN 15  CREATININE 1.15  CALCIUM  9.2  GFRNONAA >60  ANIONGAP 12    Recent Labs  Lab 07/10/24 2150  PROT 7.3  ALBUMIN 3.4*  AST 18  ALT 12  ALKPHOS 70  BILITOT 0.4   Hematology Recent Labs  Lab 07/10/24 2150  WBC 3.6*  RBC 3.83*  HGB 10.8*  HCT 33.9*  MCV 88.5  MCH 28.2  MCHC 31.9  RDW 13.9  PLT 147*   Radiology/Studies:  DG Chest 2 View Result Date: 07/10/2024 EXAM: 2 VIEW(S) XRAY OF THE CHEST 07/10/2024 10:14:00 PM COMPARISON: 06/07/2024 CLINICAL HISTORY: cp cp FINDINGS: LINES, TUBES AND DEVICES: Spinal cord stimulator remains in place, unchanged. LUNGS AND PLEURA: No focal pulmonary opacity. No pleural effusion. No pneumothorax. HEART AND MEDIASTINUM: No acute abnormality of the  cardiac and mediastinal silhouettes. BONES AND SOFT TISSUES: No acute osseous abnormality. IMPRESSION: 1. No acute cardiopulmonary process. Electronically signed by: Franky Crease MD 07/10/2024 10:28 PM EST RP Workstation: [id number]   Assessment and Plan:  Luis Poole is a 83 y.o. 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  MV CAD s/p PCI  Unrevascularized OM1 Spoke with him about different options for managing his chest discomfort.  He only had 1 episode of chest pain at rest that lasted for about 1 hour and was moderate intensity.  He has not been started on any additional antianginal treatment for his CAD after initial PCI.  There was some question of whether he would stop his daily Viagra to start Imdur .  He said he had no problem with this and did not know why he was even taking daily Viagra.  Reviewed previous angiography and would preference trying to optimize  GDMT first before reattempt at OM PCI after seeing difficulty of last procedure. - start imdur  60 mg daily  - start Ranexa  500 mg bid  - continue DAPT with ASA/plavix    HTN - continue OP amlodipine  5 mg daily  - reduce OP valsartan  from 320 mg daily to 160 mg daily with starting imdur , reduced IP irbesartan  from 300 mg to 150 mg to accommodate this   HLD - continue OP atorva 80 mg daily   DM2 - per IM  For questions or updates, please contact [facility] Please consult [email or web address] for contact info under   Signed, Donnice DELENA Primus, MD  07/11/2024 8:02 AM

## 2024-07-11 NOTE — Assessment & Plan Note (Signed)
 Anemia of chronic disease.  Stable hgb  Follow up as outpatient

## 2024-07-11 NOTE — Assessment & Plan Note (Signed)
-   No updated A1c - Will update at this time - SSI and CBG monitoring for now

## 2024-07-11 NOTE — Assessment & Plan Note (Signed)
 Continue blood pressure control, continue antiplatelet therapy and statin

## 2024-07-11 NOTE — Assessment & Plan Note (Signed)
 Hypokalemia.   Renal function stable, his serum cr at the time of discharge is 1,18 with K at 3,8 and serum bicarbonate at 24  Na 135 and Mg 1,8   Plan to continue follow up renal function as outpatient.

## 2024-07-11 NOTE — Hospital Course (Addendum)
 Mr. Duchemin was admitted to the hospital with the working diagnosis of chest pain.   83 yo male with past medical history of coronary artery disease, CVA, chronic left foot drop, CKD, T2DM, hypertension and heart failure who presented with chest pain.   Recent NSTEMI and was hospitalized 06/07/2024 to 06/18/2024. Patient had left heart cath and was found to have lesion 1 mid LAD 90% stenosis. DES was successfully placed in that LAD lesion. Lesion 2 first diagonal demonstrated 90% stenosis the patient had a DES placed there as well.  Lesion 3 mid circumflex lesion was 99% stenosis; balloon angioplasty performed with residual 50% stenosis.  Lesion 4 first marginal 99% stenosed with balloon angioplasty attempted but unable to cross the lesion and post intervention remained 99% residual stenosis without being able to advance a second wire.  Distal circumflex lesion 100% stenosis beyond second marginal. Patient was discharged with instructions to continue medical management with aspirin  and clopidogrel  for minimum 12 months; plan was for potential repeat PCI if necessary in the future.  Unfortunately at home he had recurrent chest pain, while getting into an argument with his son. He has poor mobility and no frank chest pain with exertion.   On his initial physical examination his blood pressure was 144/63, HR 59, RR 20 and 02 saturation 100%  Lungs with no wheezing or rales, heart with S1 and S2 present and regular, abdomen with no distention and no lower extremity edema.   Na 138, K 3.3 Cl 105 bicarbonate 21 glucose 133 bun 15 cr 1,15  Wbc 3.6 hgb 10.8 plt 147  High sensitive troponin 6 and 5   Chest radiograph with hypoinflation, with no cardiomegaly, no infiltrates and no effusions. Spine stimulator in place.   EKG 70 bpm, normal axis, qtc 464, right bundle branch block, sinus rhythm with no significant ST segment or T wave changes.   Patient was placed on medical therapy with improvement in his  symptoms.

## 2024-07-11 NOTE — H&P (Signed)
 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 Recent NSTEMI and was hospitalized 06/07/2024 to 06/18/2024. Patient had left heart cath and was found to have lesion 1 mid LAD 90% stenosis. DES was successfully placed in that LAD lesion. Lesion 2 first diagonal demonstrated 90% stenosis the patient had a DES placed there as well.  Lesion 3 mid circumflex lesion was 99% stenosis; balloon angioplasty performed with residual 50% stenosis.  Lesion 4 first marginal 99% stenosed with balloon angioplasty attempted but unable to cross the lesion and post intervention remained 99% residual stenosis without being able to advance a second wire.  Distal circumflex lesion 100% stenosis beyond second marginal.  Plan at that time was for ongoing medical management with aspirin  and Plavix  for minimum 12 months; plan was for potential repeat PCI if necessary in the future.  He presented back to the hospital for this hospitalization due to recurrent chest pain.  He states episode was precipitated around his son getting into an argument with significant other and patient became distressed during this.  He has also had difficulty sleeping in the morning due to excessive noise outside including excessive noise from school buses he says.  Pain on admission was 10/10 and has improved since admission to 4/10. Cardiology was consulted on admission and will evaluate for potential repeat PCI. Troponin on admission negative x 2.  EKG noted with TWI in anterior leads but also seen on prior EKGs.  I have personally briefly reviewed patient's old medical records in [facility] and discussed patient with the ER provider when  appropriate/indicated.  Assessment and Plan: * CAD (coronary artery disease) - Patient presents back with recurrent chest pain after becoming stressed out at home during son's argument with his SO -Recent left heart cath performed 06/11/2024 with 2 DES placed.  Mid LAD and first diagonal.  Third lesion at mid Cx 99% stenosis down to 50% stenosis after balloon angioplasty.  Fourth lesion first marginal 99% stenosis unable to be treated with balloon angioplasty.  Distal circumflex 100% stenosis beyond second marginal - Has been on aspirin /Plavix  - Chest pain approximately 4/10 this morning at rest - Cardiology aware of admission.  Follow-up for further recommendations and/or if undergoing PCI - okay for diet; no procedures planned today  Chest pain - Imdur  and Ranexa  started per cardiology for chest pain - See CAD plan above also  Hypokalemia - Replete as needed  Chronic diastolic heart failure (HCC) - No signs/symptoms exacerbation - Recent echo 06/08/2024: EF 65 to 70%, indeterminate diastolic dysfunction but history of grade 1 on 08/12/2023  Chronic kidney disease, stage 3a (HCC) - patient has history of CKD3a. 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 (HCC) - No updated A1c - Will update at this time - SSI and CBG monitoring for now  Hypertension - Continue amlodipine  and ARB - Imdur  also added 11/23 per cardiology  History of CVA (cerebrovascular accident) - Continue aspirin  and statin   Code Status:  Code Status: Full Code  DVT Prophylaxis:   enoxaparin  (LOVENOX ) injection 40 mg Start: 07/11/24 2000   Anticipated disposition is to: Home   History: Past Medical History:  Diagnosis Date   Anemia, unspecified 06/08/2013   Cataract    CHF (congestive heart failure) (HCC)    Chronic kidney disease    Diabetes  mellitus    Foot drop, left foot    AFO   Hypertension    PAD (peripheral artery disease)    S/P lumbar fusion    Sequelae of cerebral infarction    Stroke [facility])     Past Surgical History:  Procedure Laterality Date   CATARACT EXTRACTION     CORONARY STENT INTERVENTION N/A 06/11/2024   Procedure: CORONARY STENT INTERVENTION;  Surgeon: Anner Alm ORN, MD;  Location: [facility] INVASIVE CV [facility];  Service: Cardiovascular;  Laterality: N/A;   EYE SURGERY     KNEE SURGERY  2006   LEFT HEART CATH AND CORONARY ANGIOGRAPHY N/A 06/08/2024   Procedure: LEFT HEART CATH AND CORONARY ANGIOGRAPHY;  Surgeon: Ladona Heinz, MD;  Location: [facility] INVASIVE CV [facility];  Service: Cardiovascular;  Laterality: N/A;   NECK SURGERY  2012   SPINE SURGERY  2009   TONSILECTOMY, ADENOIDECTOMY, BILATERAL MYRINGOTOMY AND TUBES       reports that he has quit smoking. He has quit using smokeless tobacco. He reports that he does not drink alcohol and does not use drugs.  Allergies  Allergen Reactions   Ibuprofen  Itching   Lisinopril Other (See Comments)    Swollen lips   Pregabalin  Swelling    Recently filled 11/24 for 90ds and reports taking on 08/11/23   Gabapentin  Other (See Comments)    Dizziness    Family History  Problem Relation Age of Onset   Stroke Mother    Heart attack Maternal Grandfather    Heart attack Paternal Grandfather    Home Medications: Prior to Admission medications   Medication Sig Start Date End Date Taking? Authorizing Provider  allopurinol  (ZYLOPRIM ) 100 MG tablet Take 100 mg by mouth daily as needed (for gout flareups).   Yes [provider]  amLODipine  (NORVASC ) 10 MG tablet Take 0.5 tablets (5 mg total) by mouth daily. 08/12/23  Yes Danford, Lonni SQUIBB, MD  aspirin  EC 81 MG tablet Take 1 tablet (81 mg total) by mouth daily. 06/12/24  Yes Drusilla Sabas RAMAN, MD  atorvastatin  (LIPITOR ) 80 MG tablet Take 1 tablet (80 mg total) by mouth daily. 06/13/24  Yes Drusilla Sabas RAMAN, MD   clopidogrel  (PLAVIX ) 75 MG tablet Take 1 tablet (75 mg total) by mouth daily with breakfast. 06/13/24  Yes Drusilla, Sabas RAMAN, MD  colchicine  0.6 MG tablet Take 1 tablet (0.6 mg total) daily by mouth. Patient taking differently: Take 0.6 mg by mouth daily as needed (gout pain). 06/29/17  Yes Dolan Mateo Larger, MD  cyclobenzaprine  (FLEXERIL ) 5 MG tablet Take 1 tablet (5 mg total) by mouth 2 (two) times daily as needed for muscle spasms. 05/25/24  Yes Cardama, Raynell Moder, MD  ferrous sulfate  325 (65 FE) MG tablet Take 325 mg by mouth daily with breakfast.   Yes [provider]  lidocaine  (LIDODERM ) 5 % Place 1 patch onto the skin daily. Remove & Discard patch within 12 hours or as directed by MD 03/31/24  Yes Barrett, Jamie N, PA-C  meclizine  (ANTIVERT ) 12.5 MG tablet Take 12.5 mg by mouth 3 (three) times daily as needed for dizziness.   Yes [provider]  oxycodone  (OXY-IR) 5 MG capsule Take 5 mg by mouth every 4 (four) hours as needed for pain.   Yes [provider]  polyethylene glycol (MIRALAX  / GLYCOLAX ) 17 g packet Take 17 g by mouth daily as needed for mild constipation. 11/29/22  Yes Rizwan, Saima, MD  potassium chloride  (KLOR-CON  M) 10 MEQ tablet Take 10 mEq by mouth daily. 07/06/24  Yes [provider]  valsartan  (DIOVAN ) 320 MG tablet Take 320 mg by mouth daily. 03/29/24  Yes [provider]    Review of Systems:  Review of Systems  Constitutional: Negative.   HENT: Negative.    Eyes: Negative.   Respiratory: Negative.    Cardiovascular:  Positive for chest pain.  Gastrointestinal: Negative.   Genitourinary: Negative.   Musculoskeletal: Negative.   Skin: Negative.   Neurological: Negative.   Endo/Heme/Allergies: Negative.   Psychiatric/Behavioral: Negative.      Physical Exam:  Vitals:   07/11/24 0700 07/11/24 0730 07/11/24 1000 07/11/24 1036  BP: (!) 144/63 (!) 147/77 127/64   Pulse: (!) 59 62 (!) 59   Resp: 16 20 14    Temp:     97.7 F (36.5 C)  TempSrc:    Oral  SpO2: 100% 100% 100%    Physical Exam Constitutional:      General: He is not in acute distress.    Appearance: Normal appearance.  HENT:     Head: Normocephalic and atraumatic.     Mouth/Throat:     Mouth: Mucous membranes are moist.  Eyes:     Extraocular Movements: Extraocular movements intact.  Cardiovascular:     Rate and Rhythm: Normal rate and regular rhythm.  Pulmonary:     Effort: Pulmonary effort is normal. No respiratory distress.     Breath sounds: Normal breath sounds. No wheezing.  Abdominal:     General: Bowel sounds are normal. There is no distension.     Palpations: Abdomen is soft.     Tenderness: There is no abdominal tenderness.  Musculoskeletal:        General: Normal range of motion.     Cervical back: Normal range of motion and neck supple.  Skin:    General: Skin is warm and dry.  Neurological:     General: No focal deficit present.     Mental Status: He is alert.  Psychiatric:        Mood and Affect: Mood normal.        Behavior: Behavior normal.      Labs on Admission:  I have personally reviewed following labs and imaging studies Results for orders placed or performed during the hospital encounter of 07/10/24 (from the past 24 hours)  Basic metabolic panel     Status: Abnormal   Collection Time: 07/10/24  9:50 PM  Result Value Ref Range   Sodium 138 135 - 145 mmol/L   Potassium 3.3 (L) 3.5 - 5.1 mmol/L   Chloride 105 98 - 111 mmol/L   CO2 21 (L) 22 - 32 mmol/L   Glucose, Bld 133 (H) 70 - 99 mg/dL   BUN 15 8 - 23 mg/dL   Creatinine, Ser 8.84 0.61 - 1.24 mg/dL   Calcium  9.2 8.9 - 10.3 mg/dL   GFR, Estimated >39 >39 mL/min   Anion gap 12 5 - 15  CBC     Status: Abnormal   Collection Time: 07/10/24  9:50 PM  Result Value Ref Range   WBC 3.6 (L) 4.0 - 10.5 K/uL   RBC  3.83 (L) 4.22 - 5.81 MIL/uL   Hemoglobin 10.8 (L) 13.0 - 17.0 g/dL   HCT 66.0 (L) 60.9 - 47.9 %   MCV 88.5 80.0 - 100.0 fL   MCH  28.2 26.0 - 34.0 pg   MCHC 31.9 30.0 - 36.0 g/dL   RDW 86.0 88.4 - 84.4 %   Platelets 147 (L) 150 - 400 K/uL   nRBC 0.0 0.0 - 0.2 %  Troponin I (High Sensitivity)     Status: None   Collection Time: 07/10/24  9:50 PM  Result Value Ref Range   Troponin I (High Sensitivity) 6 <18 ng/L  Hepatic function panel     Status: Abnormal   Collection Time: 07/10/24  9:50 PM  Result Value Ref Range   Total Protein 7.3 6.5 - 8.1 g/dL   Albumin 3.4 (L) 3.5 - 5.0 g/dL   AST 18 15 - 41 U/L   ALT 12 0 - 44 U/L   Alkaline Phosphatase 70 38 - 126 U/L   Total Bilirubin 0.4 0.0 - 1.2 mg/dL   Bilirubin, Direct <9.8 0.0 - 0.2 mg/dL   Indirect Bilirubin NOT CALCULATED 0.3 - 0.9 mg/dL  Troponin I (High Sensitivity)     Status: None   Collection Time: 07/10/24 11:55 PM  Result Value Ref Range   Troponin I (High Sensitivity) 5 <18 ng/L     Radiological Exams on Admission: DG Chest 2 View Result Date: 07/10/2024 EXAM: 2 VIEW(S) XRAY OF THE CHEST 07/10/2024 10:14:00 PM COMPARISON: 06/07/2024 CLINICAL HISTORY: cp cp FINDINGS: LINES, TUBES AND DEVICES: Spinal cord stimulator remains in place, unchanged. LUNGS AND PLEURA: No focal pulmonary opacity. No pleural effusion. No pneumothorax. HEART AND MEDIASTINUM: No acute abnormality of the cardiac and mediastinal silhouettes. BONES AND SOFT TISSUES: No acute osseous abnormality. IMPRESSION: 1. No acute cardiopulmonary process. Electronically signed by: Franky Crease MD 07/10/2024 10:28 PM EST RP Workstation: [id number]   DG Chest 2 View  Final Result      Consults called:  Cardiology   EKG: Independently reviewed. Chronic TWI, NSR   Alm Apo, MD [facility] 07/11/2024, 11:59 AM

## 2024-07-12 DIAGNOSIS — Z955 Presence of coronary angioplasty implant and graft: Secondary | ICD-10-CM | POA: Diagnosis not present

## 2024-07-12 DIAGNOSIS — E119 Type 2 diabetes mellitus without complications: Secondary | ICD-10-CM

## 2024-07-12 DIAGNOSIS — N1831 Chronic kidney disease, stage 3a: Secondary | ICD-10-CM

## 2024-07-12 DIAGNOSIS — I25118 Atherosclerotic heart disease of native coronary artery with other forms of angina pectoris: Secondary | ICD-10-CM | POA: Diagnosis not present

## 2024-07-12 DIAGNOSIS — I5032 Chronic diastolic (congestive) heart failure: Secondary | ICD-10-CM | POA: Diagnosis not present

## 2024-07-12 DIAGNOSIS — R072 Precordial pain: Secondary | ICD-10-CM | POA: Diagnosis not present

## 2024-07-12 DIAGNOSIS — R079 Chest pain, unspecified: Secondary | ICD-10-CM | POA: Diagnosis not present

## 2024-07-12 LAB — GLUCOSE, CAPILLARY
Glucose-Capillary: 157 mg/dL — ABNORMAL HIGH (ref 70–99)
Glucose-Capillary: 84 mg/dL (ref 70–99)
Glucose-Capillary: 114 mg/dL — ABNORMAL HIGH (ref 70–99)
Glucose-Capillary: 110 mg/dL — ABNORMAL HIGH (ref 70–99)

## 2024-07-12 MED ORDER — INSULIN ASPART 100 UNIT/ML IJ SOLN: 0.0000 [IU] | Freq: Three times a day (TID) | INTRAMUSCULAR | Status: DC

## 2024-07-12 NOTE — Plan of Care (Signed)
  Problem: Education: Goal: Ability to describe self-care measures that may prevent or decrease complications (Diabetes Survival Skills Education) will improve Outcome: Progressing   Problem: Coping: Goal: Ability to adjust to condition or change in health will improve Outcome: Progressing   Problem: Activity: Goal: Risk for activity intolerance will decrease Outcome: Progressing   Problem: Coping: Goal: Level of anxiety will decrease Outcome: Progressing

## 2024-07-12 NOTE — Progress Notes (Signed)
    Durable Medical Equipment  (From admission, onward)           Start     Ordered   07/12/24 1515  For home use only DME lightweight manual wheelchair with seat cushion  Once       Comments: Patient suffers from weakness which impairs their ability to perform daily activities like bathing, dressing, grooming, and toileting in the home.  A cane or walker will not resolve  issue with performing activities of daily living. A wheelchair will allow patient to safely perform daily activities. Patient is not able to propel themselves in the home using a standard weight wheelchair due to general weakness. Patient can self propel in the lightweight wheelchair. Length of need Lifetime. Accessories: elevating leg rests (ELRs), wheel locks, extensions and anti-tippers.   07/12/24 1515

## 2024-07-12 NOTE — Progress Notes (Signed)
 Progress Note    Luis Poole   FMW:[id number]  DOB: 01-Feb-1941  DOA: 07/10/2024     1 PCP: Pura Lenis, MD  Initial CC: Luis Poole: Mr. 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 Recent NSTEMI and was hospitalized 06/07/2024 to 06/18/2024. Patient had left heart cath and was found to have lesion 1 mid LAD 90% stenosis. DES was successfully placed in that LAD lesion. Lesion 2 first diagonal demonstrated 90% stenosis the patient had a DES placed there as well.  Lesion 3 mid circumflex lesion was 99% stenosis; balloon angioplasty performed with residual 50% stenosis.  Lesion 4 first marginal 99% stenosed with balloon angioplasty attempted but unable to cross the lesion and post intervention remained 99% residual stenosis without being able to advance a second wire.  Distal circumflex lesion 100% stenosis beyond second marginal.  Plan at that time was for ongoing medical management with aspirin  and Plavix  for minimum 12 months; plan was for potential repeat PCI if necessary in the future.  He presented back to the hospital for this hospitalization due to recurrent chest pain.  He states episode was precipitated around his son getting into an argument with significant other and patient became distressed during this.  He has also had difficulty sleeping in the morning due to excessive noise outside including excessive noise from school buses he says.  Pain on admission was 10/10 and has improved since admission to 4/10. Cardiology was consulted on admission and will evaluate for potential repeat PCI. Troponin on admission negative x 2.  EKG noted with TWI in anterior leads but also seen on prior EKGs.  Interval History:  Resting comfortably in bed this morning.  Chest pain much improved since yesterday.  No other concerns.  Assessment and Plan: * CAD  (coronary artery disease) - Patient presents back with recurrent chest pain after becoming stressed out at home during son's argument with his SO -Recent left heart cath performed 06/11/2024 with 2 DES placed.  Mid LAD and first diagonal.  Third lesion at mid Cx 99% stenosis down to 50% stenosis after balloon angioplasty.  Fourth lesion first marginal 99% stenosis unable to be treated with balloon angioplasty.  Distal circumflex 100% stenosis beyond second marginal - Has been on aspirin /Plavix  - Nitrates were initially held off after cath due to patient being on Viagra.  Now has been started on Imdur  and Ranexa  per cardiology and patient okay with remaining off Viagra - If refractory chest pain despite medical management, potentially cath at that time  Chest pain - Imdur  and Ranexa  started per cardiology for chest pain - See CAD plan above also  Hypokalemia - Replete as needed  Chronic diastolic heart failure (HCC) - No signs/symptoms exacerbation - Recent echo 06/08/2024: EF 65 to 70%, indeterminate diastolic dysfunction but history of grade 1 on 08/12/2023  Chronic kidney disease, stage 3a (HCC) - patient has history of CKD3a. 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 (HCC) - A1c 5.6% - Diet controlled essentially - SSI and CBG monitoring for now  Hypertension - Continue amlodipine  and ARB - Imdur  also added 11/23 per cardiology  History of CVA (cerebrovascular accident) -  Continue aspirin  and statin   Antimicrobials:   DVT prophylaxis:  enoxaparin  (LOVENOX ) injection 40 mg Start: 07/11/24 2000   Code Status:   Code Status: Full Code  Mobility Assessment (Last 72 Hours)     Mobility Assessment     Row Name 07/12/24 0739 07/12/24 0737 07/11/24 2100       Does the patient have exclusion criteria? No - Perform  mobility assessment No - Perform mobility assessment No - Perform mobility assessment     What is the highest level of mobility based on the mobility assessment? Level 4 (Ambulates with assistance) - Balance while stepping forward/back - Complete Level 4 (Ambulates with assistance) - Balance while stepping forward/back - Complete Level 4 (Ambulates with assistance) - Balance while stepping forward/back - Complete        Diet: Diet Orders (From admission, onward)     Start     Ordered   07/12/24 1033  Diet Heart Room service appropriate? Yes; Fluid consistency: Thin  Diet effective now       Question Answer Comment  Room service appropriate? Yes   Fluid consistency: Thin      07/12/24 1032            Barriers to discharge: None Disposition Plan: Home HH orders placed: TBD Status is: Inpt  Objective: Blood pressure 123/77, pulse 62, temperature 98.5 F (36.9 C), temperature source Oral, resp. rate 16, height 5' 7 (1.702 m), weight 84.4 kg, SpO2 100%.  Examination:  Physical Exam Constitutional:      General: He is not in acute distress.    Appearance: Normal appearance.  HENT:     Head: Normocephalic and atraumatic.     Mouth/Throat:     Mouth: Mucous membranes are moist.  Eyes:     Extraocular Movements: Extraocular movements intact.  Cardiovascular:     Rate and Rhythm: Normal rate and regular rhythm.  Pulmonary:     Effort: Pulmonary effort is normal. No respiratory distress.     Breath sounds: Normal breath sounds. No wheezing.  Abdominal:     General: Bowel sounds are normal. There is no distension.     Palpations: Abdomen is soft.     Tenderness: There is no abdominal tenderness.  Musculoskeletal:        General: Normal range of motion.     Cervical back: Normal range of motion and neck supple.  Skin:    General: Skin is warm and dry.  Neurological:     General: No focal deficit present.     Mental Status: He is alert.  Psychiatric:        Mood and  Affect: Mood normal.        Behavior: Behavior normal.      Consultants:  Cardiology  Procedures:    Data Reviewed: Results for orders placed or performed during the hospital encounter of 07/10/24 (from the past 24 hours)  CBG monitoring, ED     Status: None   Collection Time: 07/11/24 12:45 PM  Result Value Ref Range   Glucose-Capillary 79 70 - 99 mg/dL  Glucose, capillary     Status: Abnormal   Collection Time: 07/11/24  4:05 PM  Result Value Ref Range   Glucose-Capillary 108 (H) 70 - 99 mg/dL  Glucose, capillary     Status: Abnormal   Collection Time: 07/11/24  9:17 PM  Result Value Ref Range   Glucose-Capillary 332 (H) 70 - 99 mg/dL  Glucose, capillary     Status:  Abnormal   Collection Time: 07/11/24 10:23 PM  Result Value Ref Range   Glucose-Capillary 115 (H) 70 - 99 mg/dL  Glucose, capillary     Status: Abnormal   Collection Time: 07/12/24  6:20 AM  Result Value Ref Range   Glucose-Capillary 157 (H) 70 - 99 mg/dL  Glucose, capillary     Status: None   Collection Time: 07/12/24 11:07 AM  Result Value Ref Range   Glucose-Capillary 84 70 - 99 mg/dL    I have reviewed pertinent nursing notes, vitals, labs, and images as necessary. I have ordered labwork to follow up on as indicated.  I have reviewed the last notes from staff over past 24 hours. I have discussed patient's care plan and test results with nursing staff, CM/SW, and other staff as appropriate.  Old records reviewed in assessment of this patient  Time spent: Greater than 50% of the 55 minute visit was spent in counseling/coordination of care for the patient as laid out in the A&P.   LOS: 1 day   Alm Apo, MD [facility] 07/12/2024, 11:57 AM

## 2024-07-12 NOTE — Progress Notes (Signed)
 C/O chest pain 9:10.  VSS, no change in EKG.  Meds given and MD notified

## 2024-07-12 NOTE — TOC Initial Note (Addendum)
 Transition of Care [facility]) - Initial/Assessment Note    Patient Details  Name: Luis Poole MRN: [id number] Date of Birth: 04-Mar-1941  Transition of Care [facility]) CM/SW Contact:    Waddell Barnie Rama, RN Phone Number: 07/12/2024, 3:33 PM  Clinical Narrative:                 From home with son, has PCP and insurance on file, states has no HH services in place at this time , has an old w/chair greater than 81 years old at home.  States family member ( sister) will transport them home at [employer] and family is support system, states gets medications from [place] on [address].  Pta w/chair.   Patient states he needs a new light weight w/chair.  He does not have a preference of the agency.  [employer] to supply the lightweight w/chair.  Per PT eval rec SNF,  CSW aware.  Expected Discharge Plan: Home/Self Care Barriers to Discharge: Continued Medical Work up   Patient Goals and CMS Choice Patient states their goals for this hospitalization and ongoing recovery are:: return home   Choice offered to / list presented to : NA      Expected Discharge Plan and Services In-house Referral: NA Discharge Planning Services: CM Consult Post Acute Care Choice: NA Living arrangements for the past 2 months: Single Family Home                 DME Arranged: Wheelchair manual DME Agency: [employer] Date DME Agency Contacted: 07/12/24 Time DME Agency Contacted: 1531 Representative spoke with at DME Agency: [place] HH Arranged: NA          Prior Living Arrangements/Services Living arrangements for the past 2 months: Single Family Home Lives with:: Adult Children Patient language and need for interpreter reviewed:: Yes Do you feel safe going back to the place where you live?: Yes      Need for Family Participation in Patient Care: Yes (Comment) Care giver support system in place?: Yes (comment) Current home services: DME (walker, w/chair) Criminal Activity/Legal Involvement Pertinent  to Current Situation/Hospitalization: No - Comment as needed  Activities of Daily Living      Permission Sought/Granted Permission sought to share information with : Case Manager Permission granted to share information with : Yes, Verbal Permission Granted     Permission granted to share info w AGENCY: DME        Emotional Assessment Appearance:: Appears stated age Attitude/Demeanor/Rapport: Engaged Affect (typically observed): Appropriate Orientation: : Oriented to Self, Oriented to Place, Oriented to  Time, Oriented to Situation Alcohol / Substance Use: Not Applicable Psych Involvement: No (comment)  Admission diagnosis:  CAD (coronary artery disease) [I25.10] Chest pain, unspecified type [R07.9] Patient Active Problem List   Diagnosis Date Noted   Presence of drug coated stent in LAD coronary artery 07/12/2024   CAD (coronary artery disease) 07/11/2024   Chest pain 06/11/2024   Chronic pain 06/08/2024   Debility 12/25/2023   Lightheadedness 12/24/2023   Coronary artery disease involving native coronary artery of native heart without angina pectoris 08/12/2023   Pressure injury of skin 08/12/2023   Hypokalemia 08/12/2023   AKI (acute kidney injury) 08/11/2023   Near syncope 11/26/2022   Syncope 10/06/2017   NSTEMI (non-ST elevated myocardial infarction) (HCC) 10/06/2017   Low back pain 06/28/2017   Leg swelling 08/23/2013   Left leg swelling 08/23/2013   Neuropathy 08/23/2013   Acute gout 08/23/2013   D-dimer, elevated 08/23/2013  Chronic kidney disease, stage 3a (HCC) 08/23/2013   Chronic diastolic heart failure (HCC) 08/23/2013   Normocytic anemia 06/08/2013   Hypertension 08/29/2012   Diabetes mellitus type 2 in nonobese (HCC) 08/29/2012   History of CVA (cerebrovascular accident) 08/28/2012    Class: Acute   L-S radiculopathy 08/28/2012   Lumbar post-laminectomy syndrome 01/15/2012   Thoracic or lumbosacral neuritis or radiculitis, unspecified 01/15/2012    Left hemiparesis (HCC) 01/15/2012   Diabetic peripheral neuropathy (HCC) 01/15/2012   PCP:  Pura Lenis, MD Pharmacy:   [facility] [phone] [place],  - [address] AT [facility] [address] [facility] [address] [place] [address] Phone: [phone] Fax: [phone]     Social Drivers of Health (SDOH) Social History: SDOH Screenings   Food Insecurity: Food Insecurity Present (07/11/2024)  Housing: Low Risk  (07/11/2024)  Transportation Needs: Unmet Transportation Needs (07/12/2024)  Utilities: Not At Risk (07/12/2024)  Financial Resource Strain: Low Risk  (09/25/2023)   Received from [facility]  Physical Activity: Unknown (06/26/2023)   Received from [facility]  Social Connections: Moderately Integrated (07/11/2024)  Recent Concern: Social Connections - Moderately Isolated (06/08/2024)  Stress: Stress Concern Present (06/26/2023)   Received from [facility]  Tobacco Use: Medium Risk (07/10/2024)   SDOH Interventions:     Readmission Risk Interventions    07/12/2024    3:20 PM  Readmission Risk Prevention Plan  Transportation Screening Complete  Medication Review (RN Care Manager) Complete  PCP or Specialist appointment within 3-5 days of discharge Complete  HRI or Home Care Consult Complete  Palliative Care Screening Not Applicable  Skilled Nursing Facility Not Applicable

## 2024-07-12 NOTE — Progress Notes (Addendum)
 Received message by nurse reporting that after eating patient started to feel nauseous and started having chest pain.  After he vomited he had improvement in chest pain.  Reported burning sensation after eating lunch today, he thinks episodes may occur around food.  EKG with no acute ST-T wave changes, Chest pain may be related to undiagnosed GERD.  Difficult to get more detailed report right now as he has just been given morphine  and a bit drowsy. Would continue current plan.

## 2024-07-12 NOTE — Evaluation (Signed)
 Physical Therapy Evaluation Patient Details Name: Luis Poole MRN: [id number] DOB: July 10, 1941 Today's Date: 07/12/2024  History of Present Illness  Pt admitted 11/22 with chest pain and SOB. Recent NSTEMI.  Hx of CAD.  MD adjusting medications.  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  Clinical Impression  Pt admitted with above diagnosis. Pt needing mod assist of 2 for transfers due to decr safety, poor posture and poor sequencing of steps with transfer. Unsure that liviing situation is ideal.  Feel that pt needs to improve his mobility prior to d/c home. Recommend post acute rehab < 3hours day.   Pt currently with functional limitations due to the deficits listed below (see PT Problem List). Pt will benefit from acute skilled PT to increase their independence and safety with mobility to allow discharge.           If plan is discharge home, recommend the following: A lot of help with walking and/or transfers;A lot of help with bathing/dressing/bathroom;Assistance with cooking/housework;Assist for transportation;Help with stairs or ramp for entrance   Poole travel by private vehicle   No    Equipment Recommendations Other (comment) (Pt has been trying to get a scooter per pt - needs scooter vs. wheelchair as his w/c was bought in 2019)  Recommendations for Other Services       Functional Status Assessment Patient has had a recent decline in their functional status and demonstrates the ability to make significant improvements in function in a reasonable and predictable amount of time.     Precautions / Restrictions Precautions Precautions: Fall Required Braces or Orthoses: Other Brace (left metal upright brace) Restrictions Weight Bearing Restrictions Per Provider Order: No      Mobility  Bed Mobility Overal bed mobility: Needs Assistance Bed Mobility: Supine to Sit     Supine  to sit: Mod assist     General bed mobility comments: Needed assist with LEs    Transfers Overall transfer level: Needs assistance Equipment used: Rolling walker (2 wheels) Transfers: Sit to/from Stand, Bed to chair/wheelchair/BSC Sit to Stand: Mod assist, +2 safety/equipment, From elevated surface Stand pivot transfers: Min assist, +2 safety/equipment         General transfer comment: Asssist to power up to RW.  Pt had difficutly with standing fully upright wtih flexed trunk and Knees flexed throughout. Pt was able to pivot but only with cues for sequencing steps with incr time for pt to move left LE and moving it with great difficulty.  Also needed assist to move RW.    Ambulation/Gait               General Gait Details: Unable today and reports he was using wheelchair alot at home  Stairs            Wheelchair Mobility     Tilt Bed    Modified Rankin (Stroke Patients Only)       Balance Overall balance assessment: Needs assistance Sitting-balance support: No upper extremity supported, Feet supported Sitting balance-Leahy Scale: Fair     Standing balance support: Bilateral upper extremity supported, During functional activity, Reliant on assistive device for balance Standing balance-Leahy Scale: Poor Standing balance comment: relies on UE support                             Pertinent  Vitals/Pain Pain Assessment Pain Assessment: Faces Faces Pain Scale: Hurts little more Pain Location: generalized Pain Descriptors / Indicators: Aching, Discomfort, Grimacing, Guarding Pain Intervention(s): Limited activity within patient's tolerance, Monitored during session, Repositioned    Home Living Family/patient expects to be discharged to:: Private residence Living Arrangements: Children Available Help at Discharge: Family;Available PRN/intermittently (son is present intermittently) Type of Home: House Home Access: Ramped entrance       Home  Layout: One level Home Equipment: Rollator (4 wheels);Rolling Walker (2 wheels);Shower seat - built in;Wheelchair - manual;Toilet riser;Hospital bed;Grab bars - toilet;Grab bars - tub/shower Additional Comments: Has left AFO on shoe, slid out of wheelchair 2 x and fell 2x; PT trying to get a scooter    Prior Function Prior Level of Function : History of Falls (last six months)             Mobility Comments: short distances with rW, uses wheelchair most of time per pt ADLs Comments: B/D self     Extremity/Trunk Assessment   Upper Extremity Assessment Upper Extremity Assessment: Defer to OT evaluation    Lower Extremity Assessment Lower Extremity Assessment: Generalized weakness    Cervical / Trunk Assessment Cervical / Trunk Assessment: Kyphotic  Communication   Communication Communication: No apparent difficulties    Cognition Arousal: Alert Behavior During Therapy: WFL for tasks assessed/performed   PT - Cognitive impairments: No apparent impairments                         Following commands: Intact       Cueing       General Comments General comments (skin integrity, edema, etc.): 63 bpm, 100/56on arrival;  88/56 supine prior to sitting up. 121/78 end of session    Exercises General Exercises - Lower Extremity Ankle Circles/Pumps: AROM, Both, 10 reps, Supine Long Arc Quad: AROM, Both, 10 reps, Seated   Assessment/Plan    PT Assessment Patient needs continued PT services  PT Problem List Decreased balance;Decreased activity tolerance;Decreased mobility;Decreased knowledge of use of DME;Decreased safety awareness;Decreased knowledge of precautions       PT Treatment Interventions DME instruction;Functional mobility training;Therapeutic activities;Therapeutic exercise;Balance training;Patient/family education;Wheelchair mobility training    PT Goals (Current goals Poole be found in the Care Plan section)  Acute Rehab PT Goals Patient Stated Goal:  to go home PT Goal Formulation: With patient Time For Goal Achievement: 07/26/24 Potential to Achieve Goals: Good    Frequency Min 2X/week     Co-evaluation               AM-PAC PT 6 Clicks Mobility  Outcome Measure Help needed turning from your back to your side while in a flat bed without using bedrails?: A Lot Help needed moving from lying on your back to sitting on the side of a flat bed without using bedrails?: Total Help needed moving to and from a bed to a chair (including a wheelchair)?: A Lot Help needed standing up from a chair using your arms (e.g., wheelchair or bedside chair)?: Total Help needed to walk in hospital room?: Total Help needed climbing 3-5 steps with a railing? : Total 6 Click Score: 8    End of Session Equipment Utilized During Treatment: Gait belt Activity Tolerance: Patient limited by fatigue Patient left: in chair;with call bell/phone within reach;with chair alarm set Nurse Communication: Mobility status PT Visit Diagnosis: Muscle weakness (generalized) (M62.81)    Time: 8656-8587 PT Time Calculation (min) (ACUTE ONLY): 29 min  Charges:   PT Evaluation $PT Eval Moderate Complexity: 1 Mod PT Treatments $Therapeutic Activity: 8-22 mins PT General Charges $$ ACUTE PT VISIT: 1 Visit         Lyndall Windt M,PT Acute Rehab Services [phone]   Luis Poole 07/12/2024, 3:22 PM

## 2024-07-12 NOTE — Progress Notes (Addendum)
 Progress Note  Patient Name: Luis Poole Date of Encounter: 07/12/2024 [facility] Cardiologist: Soyla DELENA Merck, MD   Interval Summary   Today not having any acute complaints denied any chest pain or shortness of breath.     Vital Signs Vitals:   07/11/24 1950 07/11/24 2351 07/12/24 0525 07/12/24 0739  BP:  135/76  123/77  Pulse: 62 65 74 62  Resp:  18 16 16   Temp: 97.9 F (36.6 C) 97.9 F (36.6 C) 98.2 F (36.8 C) 98.5 F (36.9 C)  TempSrc: Oral Oral Oral Oral  SpO2: 100% 100% 100% 100%  Weight:      Height:        Intake/Output Summary (Last 24 hours) at 07/12/2024 1219 Last data filed at 07/12/2024 0900 Gross per 24 hour  Intake 360 ml  Output --  Net 360 ml      07/11/2024    3:55 PM 06/18/2024    4:38 AM 06/17/2024    5:00 AM  Last 3 Weights  Weight (lbs) 186 lb 1.1 oz 180 lb 12.4 oz 181 lb 14.1 oz  Weight (kg) 84.4 kg 82 kg 82.5 kg      Telemetry/ECG  Sinus rhythm heart rate in the 60s- Personally Reviewed  Physical Exam  GEN: No acute distress.   Neck: No JVD Cardiac: RRR, no murmurs, rubs, or gallops.  Respiratory: Clear to auscultation bilaterally. GI: Soft, nontender, non-distended  MS: No edema  Patient Profile Patient with past medical history significant for multivessel CAD s/p PCI with unrevascularized OM1 05/2024, HTN, HLD, DM, CVA, CKD chronic back pain.  Patient admitted for chest pain with negative troponins.  Assessment & Plan   Chest pain  CAD HLD -05/2024 EF 60 to 70% Recent NSTEMI 05/2024 not felt to be a good candidate for CABG.  He had successful DES to mid LAD, diagonal branch, PTCA of the mid LCx.  There was unsuccessful PTCA/PCI to OM1 due to tortuous anatomy, was not able to advance a second wire across the lesion.  He presents with 1 episode of chest pain that he describes as being a sharp sensation while he was standing outside and got dizzy.  EKG without any acute ischemic changes.  Troponins are  negative x 2.  Given difficulty anatomy and unsuccessful PCI last time would favor medical treatment, per Dr. Anner.  Seems to be doing better with uptitration of antianginals. Continue with DAPT therapy aspirin  and Plavix  for minimum of 12 months.  He did report missing 1 dose, encouraged very close compliance with these medications. Antianginals: Continue amlodipine  5 mg, Ranexa  500 mg twice daily, Imdur  60 mg.  Baseline bradycardia prohibits beta-blocker use at this time. Continue to uptitrate antianginals as needed. Continue atorvastatin  80 mg daily.  LDL 1 month ago was 113, goal less than 55.  Repeat LDL in about 1 month and consider PCSK9 inhibitor if not at goal.   Patient had very difficult anatomy when he came to the time of his PCI.  The diagonal and LAD stents were relatively straightforward however the circumflex system was very difficult.  Unable to cross into OM1 due to severe lesion and the angulated takeoff.  The smaller OM 2 branch was also subtotally occluded.  Not very favorable for PCI but he went home following his LAD and diagonal PCI and did well at the nursing facility with PT and no chest pain.  His current chest pain does not seem to be exertional in nature but he  has not yet walked today.  He is troponin levels are negative.  I would favor conservative approach-potentially titrating up antianginals which would be either increasing Imdur  or Ranexa  dose.  However if he does not have angina with ambulation, then I am relatively comfortable holding off on further titration.  Hypertension Good control continue amlodipine  and irbesartan  150 mg.  For questions or updates, please contact [facility] Please consult [email or web address] for contact info under       Signed, Thom LITTIE Sluder, PA-C       ATTENDING ATTESTATION  I have seen, examined and evaluated the patient this AM on rounds along with Thom Sluder, PA-C.  After reviewing all the available data and chart, we  discussed the patients laboratory, study & physical findings as well as symptoms in detail.  I agree with his findings, examination as well as impression recommendations as per our discussion.    Attending adjustments noted in italics.   Would like to see how he does ambulating in the hallway, depending on how with he does, would probably consider his chest pain to be not anginal in nature.  Would be very reluctant to pursue further invasive treatments unless he were to have significant exertional anginal symptoms.     Alm MICAEL Clay, MD, MS Alm Clay, M.D., M.S. Interventional Cardiologist  [facility] Pager # [phone]

## 2024-07-12 NOTE — TOC Progression Note (Signed)
 Transition of Care [facility]) - Progression Note    Patient Details  Name: Luis Poole MRN: [id number] Date of Birth: 01-Jul-1941  Transition of Care [facility] CM/SW Contact  [name], [place] Phone Number: 07/12/2024, 4:02 PM  Clinical Narrative:   CSW met patient at bedside to discuss PT rec for SNF. Patient stated he would like to go home w/ HH at this time. Patient inquired about an [facility] aide. CSW notified RNCM.   RNCM will continue to follow.   Expected Discharge Plan: Home/Self Care Barriers to Discharge: Continued Medical Work up               Expected Discharge Plan and Services In-house Referral: NA Discharge Planning Services: CM Consult Post Acute Care Choice: NA Living arrangements for the past 2 months: Single Family Home                 DME Arranged: Wheelchair manual DME Agency: [employer] Date DME Agency Contacted: 07/12/24 Time DME Agency Contacted: 1531 Representative spoke with at DME Agency: [place] HH Arranged: NA           Social Drivers of Health (SDOH) Interventions SDOH Screenings   Food Insecurity: Food Insecurity Present (07/11/2024)  Housing: Low Risk  (07/11/2024)  Transportation Needs: Unmet Transportation Needs (07/12/2024)  Utilities: Not At Risk (07/12/2024)  Financial Resource Strain: Low Risk  (09/25/2023)   Received from [facility]  Physical Activity: Unknown (06/26/2023)   Received from [facility]  Social Connections: Moderately Integrated (07/11/2024)  Recent Concern: Social Connections - Moderately Isolated (06/08/2024)  Stress: Stress Concern Present (06/26/2023)   Received from [facility]  Tobacco Use: Medium Risk (07/10/2024)    Readmission Risk Interventions    07/12/2024    3:20 PM  Readmission Risk Prevention Plan  Transportation Screening Complete  Medication Review (RN Care Manager) Complete  PCP or Specialist appointment within 3-5 days of discharge Complete  HRI or Home Care Consult  Complete  Palliative Care Screening Not Applicable  Skilled Nursing Facility Not Applicable

## 2024-07-12 NOTE — Plan of Care (Signed)
  Problem: Coping: Goal: Ability to adjust to condition or change in health will improve Outcome: Progressing   Problem: Skin Integrity: Goal: Risk for impaired skin integrity will decrease Outcome: Progressing   Problem: Education: Goal: Knowledge of General Education information will improve Description: Including pain rating scale, medication(s)/side effects and non-pharmacologic comfort measures Outcome: Progressing   Problem: Health Behavior/Discharge Planning: Goal: Ability to manage health-related needs will improve Outcome: Progressing   Problem: Clinical Measurements: Goal: Will remain free from infection Outcome: Progressing   Problem: Coping: Goal: Level of anxiety will decrease Outcome: Progressing   Problem: Safety: Goal: Ability to remain free from injury will improve Outcome: Progressing

## 2024-07-13 DIAGNOSIS — I25118 Atherosclerotic heart disease of native coronary artery with other forms of angina pectoris: Secondary | ICD-10-CM | POA: Diagnosis not present

## 2024-07-13 DIAGNOSIS — I5032 Chronic diastolic (congestive) heart failure: Secondary | ICD-10-CM | POA: Diagnosis not present

## 2024-07-13 DIAGNOSIS — Z955 Presence of coronary angioplasty implant and graft: Secondary | ICD-10-CM | POA: Diagnosis not present

## 2024-07-13 DIAGNOSIS — R072 Precordial pain: Secondary | ICD-10-CM | POA: Diagnosis not present

## 2024-07-13 LAB — BASIC METABOLIC PANEL WITH GFR
Anion gap: 10 (ref 5–15)
BUN: 14 mg/dL (ref 8–23)
CO2: 23 mmol/L (ref 22–32)
Calcium: 9 mg/dL (ref 8.9–10.3)
Chloride: 105 mmol/L (ref 98–111)
Creatinine, Ser: 1.18 mg/dL (ref 0.61–1.24)
GFR, Estimated: >60 mL/min (ref >60)
Glucose, Bld: 92 mg/dL (ref 70–99)
Potassium: 3.9 mmol/L (ref 3.5–5.1)
Sodium: 138 mmol/L (ref 135–145)

## 2024-07-13 LAB — CBC
HCT: 33.5 % — ABNORMAL LOW (ref 39.0–52.0)
Hemoglobin: 11 g/dL — ABNORMAL LOW (ref 13.0–17.0)
MCH: 27.9 pg (ref 26.0–34.0)
MCHC: 32.8 g/dL (ref 30.0–36.0)
MCV: 85 fL (ref 80.0–100.0)
Platelets: 131 K/uL — ABNORMAL LOW (ref 150–400)
RBC: 3.94 MIL/uL — ABNORMAL LOW (ref 4.22–5.81)
RDW: 13.5 % (ref 11.5–15.5)
WBC: 5.1 K/uL (ref 4.0–10.5)
nRBC: 0 % (ref 0.0–0.2)

## 2024-07-13 LAB — GLUCOSE, CAPILLARY
Glucose-Capillary: 100 mg/dL — ABNORMAL HIGH (ref 70–99)
Glucose-Capillary: 114 mg/dL — ABNORMAL HIGH (ref 70–99)
Glucose-Capillary: 117 mg/dL — ABNORMAL HIGH (ref 70–99)
Glucose-Capillary: 144 mg/dL — ABNORMAL HIGH (ref 70–99)

## 2024-07-13 LAB — MAGNESIUM: Magnesium: 1.9 mg/dL (ref 1.7–2.4)

## 2024-07-13 NOTE — Progress Notes (Signed)
 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 Recent NSTEMI and was hospitalized 06/07/2024 to 06/18/2024. Patient had left heart cath and was found to have lesion 1 mid LAD 90% stenosis. DES was successfully placed in that LAD lesion. Lesion 2 first diagonal demonstrated 90% stenosis the patient had a DES placed there as well.  Lesion 3 mid circumflex lesion was 99% stenosis; balloon angioplasty performed with residual 50% stenosis.  Lesion 4 first marginal 99% stenosed with balloon angioplasty attempted but unable to cross the lesion and post intervention remained 99% residual stenosis without being able to advance a second wire.  Distal circumflex lesion 100% stenosis beyond second marginal.  Plan at that time was for ongoing medical management with aspirin  and Plavix  for minimum 12 months; plan was for potential repeat PCI if necessary in the future.  He presented back to the hospital for this hospitalization due to recurrent chest pain.  He states episode was precipitated around his son getting into an argument with significant other and patient became distressed during this.  He has also had difficulty sleeping in the morning due to excessive noise outside including excessive noise from school buses he says.  Pain on admission was 10/10 and has improved since admission to 4/10. Cardiology was consulted on admission and will evaluate for potential repeat PCI. Troponin on admission negative x 2.  EKG noted with TWI in anterior leads but also seen on prior EKGs.  Interval History:  Sitting in recliner this morning.  Chest pain-free he says.  States it is intermittent. Will perform walking test in the hall to see if any  precipitation of chest pain with exertion.  Assessment and Plan: * CAD (coronary artery disease) - Patient presents back with recurrent chest pain after becoming stressed out at home during son's argument with his SO -Recent left heart cath performed 06/11/2024 with 2 DES placed.  Mid LAD and first diagonal.  Third lesion at mid Cx 99% stenosis down to 50% stenosis after balloon angioplasty.  Fourth lesion first marginal 99% stenosis unable to be treated with balloon angioplasty.  Distal circumflex 100% stenosis beyond second marginal - Has been on aspirin /Plavix  - Nitrates were initially held off after cath due to patient being on Viagra.  Now has been started on Imdur  and Ranexa  per cardiology and patient okay with remaining off Viagra - If refractory chest pain despite medical management, potentially cath at that time  Chest pain - Imdur  and Ranexa  started per cardiology for chest pain - See CAD plan above also  Hypokalemia - Replete as needed  Chronic diastolic heart failure (HCC) - No signs/symptoms exacerbation - Recent echo 06/08/2024: EF 65 to 70%, indeterminate diastolic dysfunction but history of grade 1 on 08/12/2023  Chronic kidney disease, stage 3a (HCC) - patient has history of CKD3a. 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 (HCC) - A1c 5.6% - Diet controlled essentially - SSI and CBG monitoring for now  Hypertension - Continue amlodipine  and  ARB - Imdur  also added 11/23 per cardiology  History of CVA (cerebrovascular accident) - Continue aspirin  and statin   Antimicrobials:   DVT prophylaxis:  enoxaparin  (LOVENOX ) injection 40 mg Start: 07/11/24 2000   Code Status:   Code Status: Full Code  Mobility Assessment (Last 72 Hours)     Mobility Assessment     Row Name 07/13/24 0739 07/12/24 2100  07/12/24 1500 07/12/24 0739 07/12/24 0737   Does the patient have exclusion criteria? No- Perform mobility assessment No- Perform mobility assessment -- No - Perform mobility assessment No - Perform mobility assessment   What is the highest level of mobility based on the mobility assessment? Level 4 (Ambulates with assistance) - Balance while stepping forward/back - Complete Level 2 (Chairfast) - Balance while sitting on edge of bed and cannot stand Level 2 (Chairfast) - Balance while sitting on edge of bed and cannot stand Level 4 (Ambulates with assistance) - Balance while stepping forward/back - Complete Level 4 (Ambulates with assistance) - Balance while stepping forward/back - Complete    Row Name 07/11/24 2100           Does the patient have exclusion criteria? No - Perform mobility assessment       What is the highest level of mobility based on the mobility assessment? Level 4 (Ambulates with assistance) - Balance while stepping forward/back - Complete          Diet: Diet Orders (From admission, onward)     Start     Ordered   07/12/24 1033  Diet Heart Room service appropriate? Yes; Fluid consistency: Thin  Diet effective now       Question Answer Comment  Room service appropriate? Yes   Fluid consistency: Thin      07/12/24 1032            Barriers to discharge: None Disposition Plan: Home HH orders placed: TBD Status is: Inpt  Objective: Blood pressure (!) 160/95, pulse 72, temperature 98.6 F (37 C), temperature source Oral, resp. rate 18, height 5' 7 (1.702 m), weight 84.4 kg, SpO2 100%.  Examination:  Physical Exam Constitutional:      General: He is not in acute distress.    Appearance: Normal appearance.  HENT:     Head: Normocephalic and atraumatic.     Mouth/Throat:     Mouth: Mucous membranes are moist.  Eyes:     Extraocular Movements: Extraocular movements intact.  Cardiovascular:     Rate and Rhythm: Normal rate and regular rhythm.  Pulmonary:      Effort: Pulmonary effort is normal. No respiratory distress.     Breath sounds: Normal breath sounds. No wheezing.  Abdominal:     General: Bowel sounds are normal. There is no distension.     Palpations: Abdomen is soft.     Tenderness: There is no abdominal tenderness.  Musculoskeletal:        General: Normal range of motion.     Cervical back: Normal range of motion and neck supple.  Skin:    General: Skin is warm and dry.  Neurological:     General: No focal deficit present.     Mental Status: He is alert.  Psychiatric:        Mood and Affect: Mood normal.        Behavior: Behavior normal.      Consultants:  Cardiology  Procedures:    Data Reviewed: Results for orders placed or performed during the hospital encounter of  07/10/24 (from the past 24 hours)  Glucose, capillary     Status: None   Collection Time: 07/12/24 11:07 AM  Result Value Ref Range   Glucose-Capillary 84 70 - 99 mg/dL  Glucose, capillary     Status: Abnormal   Collection Time: 07/12/24  3:50 PM  Result Value Ref Range   Glucose-Capillary 114 (H) 70 - 99 mg/dL  Glucose, capillary     Status: Abnormal   Collection Time: 07/12/24  9:04 PM  Result Value Ref Range   Glucose-Capillary 110 (H) 70 - 99 mg/dL  CBC     Status: Abnormal   Collection Time: 07/13/24  3:04 AM  Result Value Ref Range   WBC 5.1 4.0 - 10.5 K/uL   RBC 3.94 (L) 4.22 - 5.81 MIL/uL   Hemoglobin 11.0 (L) 13.0 - 17.0 g/dL   HCT 66.4 (L) 60.9 - 47.9 %   MCV 85.0 80.0 - 100.0 fL   MCH 27.9 26.0 - 34.0 pg   MCHC 32.8 30.0 - 36.0 g/dL   RDW 86.4 88.4 - 84.4 %   Platelets 131 (L) 150 - 400 K/uL   nRBC 0.0 0.0 - 0.2 %  Basic metabolic panel     Status: None   Collection Time: 07/13/24  3:04 AM  Result Value Ref Range   Sodium 138 135 - 145 mmol/L   Potassium 3.9 3.5 - 5.1 mmol/L   Chloride 105 98 - 111 mmol/L   CO2 23 22 - 32 mmol/L   Glucose, Bld 92 70 - 99 mg/dL   BUN 14 8 - 23 mg/dL   Creatinine, Ser 8.81 0.61 - 1.24  mg/dL   Calcium  9.0 8.9 - 10.3 mg/dL   GFR, Estimated >39 >39 mL/min   Anion gap 10 5 - 15  Magnesium      Status: None   Collection Time: 07/13/24  3:04 AM  Result Value Ref Range   Magnesium  1.9 1.7 - 2.4 mg/dL  Glucose, capillary     Status: Abnormal   Collection Time: 07/13/24  6:19 AM  Result Value Ref Range   Glucose-Capillary 100 (H) 70 - 99 mg/dL    I have reviewed pertinent nursing notes, vitals, labs, and images as necessary. I have ordered labwork to follow up on as indicated.  I have reviewed the last notes from staff over past 24 hours. I have discussed patient's care plan and test results with nursing staff, CM/SW, and other staff as appropriate.  Old records reviewed in assessment of this patient  Time spent: Greater than 50% of the 55 minute visit was spent in counseling/coordination of care for the patient as laid out in the A&P.   LOS: 2 days   Alm Apo, MD [facility] 07/13/2024, 11:06 AM

## 2024-07-13 NOTE — Plan of Care (Signed)
   Problem: Coping: Goal: Ability to adjust to condition or change in health will improve Outcome: Progressing

## 2024-07-13 NOTE — Progress Notes (Addendum)
 Progress Note  Patient Name: Luis Poole Date of Encounter: 07/13/2024 [facility] Cardiologist: Georganna Archer, MD   Interval Summary    Sitting up in the chair, has walked to the bathroom but not in the hallway. No further anginal symptoms.   Vital Signs Vitals:   07/13/24 0013 07/13/24 0356 07/13/24 0720 07/13/24 1125  BP: 129/74 (!) 149/87 (!) 160/95 138/67  Pulse: 68 65 72 76  Resp: 16 16 18 14   Temp: 98.1 F (36.7 C) 98.5 F (36.9 C) 98.6 F (37 C) 97.8 F (36.6 C)  TempSrc: Oral Oral Oral Oral  SpO2: 99% 99% 100% 100%  Weight:      Height:        Intake/Output Summary (Last 24 hours) at 07/13/2024 1156 Last data filed at 07/13/2024 1011 Gross per 24 hour  Intake 994 ml  Output 1350 ml  Net -356 ml      07/11/2024    3:55 PM 06/18/2024    4:38 AM 06/17/2024    5:00 AM  Last 3 Weights  Weight (lbs) 186 lb 1.1 oz 180 lb 12.4 oz 181 lb 14.1 oz  Weight (kg) 84.4 kg 82 kg 82.5 kg      Telemetry/ECG   Sinus Rhythm, occ OVC - Personally Reviewed  Physical Exam  GEN: No acute distress.   Neck: No JVD Cardiac: RRR, soft systolic murmur RUSB, no rubs, or gallops.  Respiratory: Clear to auscultation bilaterally. GI: Soft, nontender, non-distended  MS: No edema  Assessment & Plan   83 yo male with PMH of  multivessel CAD s/p PCI with unrevascularized OM1 05/2024, HTN, HLD, DM, CVA, CKD chronic back pain.   Chest pain CAD s/p to LAD -- Recent NSTEMI 05/2024 not felt to be a good candidate for CABG. He had successful DES to mid LAD, diagonal branch, PTCA of the mid LCx. There was unsuccessful PTCA/PCI to OM1 due to tortuous anatomy, was not able to advance a second wire across the lesion. -Truthfully, unless he has intractable angina, I would not recommend attempts to revascularize the LCx distribution.  Not favorable for PCI and risk may outweigh benefits.  His current chest pain does not seem anginal in nature.  He is on a stable regimen of  amlodipine , Ranexa  and Imdur .  He is on DAPT and high-dose statin. -- presented with episode of sharp chest pain -- hsTn negative x2 -- no further chest pain or anginal symptoms. Would continue DAPT with ASA/plavix , atorvastatin  80mg  daily, amlodipine  5mg  daily, Ranexa  500mg  BID and Imdur  60mg  daily   HTN -- controlled, continue amlodipine  5mg  daily, avapro  150mg  daily   HLD -- on atorvastatin  80mg  daily   For questions or updates, please contact  [facility] Please consult [email or web address] for contact info under    Signed, Manuelita Rummer, NP     ATTENDING ATTESTATION  I have seen, examined and evaluated the patient this afternoon on rounds along with Manuelita Rummer, PA.  After reviewing all the available data and chart, we discussed the patients laboratory, study & physical findings as well as symptoms in detail.  I agree with her findings, examination as well as impression recommendations as per our discussion.    Attending adjustments noted in italics.   Most likely noncardiac chest pains that he is complaining of but seems more consistent with GERD.  His circumflex distribution is not favorable for PCI and it is unlikely that he would also now be having atypical symptoms when he did not  have it during rehab.  Would not necessarily consider reattempt at PCI as a good option.  Medical management for chest pain is truly the only good option.     Alm MICAEL Clay, MD, MS Alm Clay, M.D., M.S. Interventional Cardiologist  [facility] Pager # [phone]

## 2024-07-13 NOTE — Progress Notes (Signed)
 Physical Therapy Treatment Patient Details Name: Luis Poole MRN: [id number] DOB: May 06, 1941 Today's Date: 07/13/2024   History of Present Illness Pt admitted 11/22 with chest pain and SOB. Recent NSTEMI.  Hx of CAD.  MD adjusting medications.  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    PT Comments  Pt admitted with above diagnosis.  Pt currently with functional limitations due to the deficits listed below (see PT Problem List).   Pt used wheelchair more often at home when RW per pt. He told this PT he had the chest pain when in w/c arguing with son. However he does walk some per pt. Pt was able to walk 18 feet with RW with supervision/CGA. Followed pt with chair and he said his left LE was giving out at 18 feet so we pushed the chair to him and he sat down. Pt did not have any symptoms of chest pain or SOB when walking and VSS. This PT is concerned about his ability to walk when at home alone as he has poor safety awareness as well as limitations due to his chronic back issues and left LE weakness/limitation. I instructed pt that this PT recommends him stay in his wheelchair unless someone is there at least initially and to not walk when he is alone. Unsure if he will follow this but did instruct him in recommendation. Agree with HHPT F/U. All of that being said, feel that pt is likely close to his baseline and was functioning rather poorly PTA.  Still recommend post acute rehab < 3 hours day. Pt will benefit from acute skilled PT to increase their independence and safety with mobility to allow discharge.    If plan is discharge home, recommend the following: A lot of help with walking and/or transfers;A lot of help with bathing/dressing/bathroom;Assistance with cooking/housework;Assist for transportation;Help with stairs or ramp for entrance   Can travel by private vehicle     No  Equipment  Recommendations  Other (comment) (Pt has been trying to get a scooter per pt - needs scooter vs. wheelchair as his w/c was bought in 2019)    Recommendations for Other Services       Precautions / Restrictions Precautions Precautions: Fall Required Braces or Orthoses: Other Brace (left metal upright brace) Restrictions Weight Bearing Restrictions Per Provider Order: No     Mobility  Bed Mobility               General bed mobility comments: Pt up in chair on arrival.    Transfers Overall transfer level: Needs assistance Equipment used: Rolling walker (2 wheels) Transfers: Sit to/from Stand, Bed to chair/wheelchair/BSC Sit to Stand: Contact guard assist, +2 safety/equipment           General transfer comment: Pt could not stand to RW unless he held one hand on RW and the other pushing up from chair.  He was able to power up with this technique on his own and get his balance once up. Pt still had difficulty with standing fully upright with pt maintaining flexed trunk and Knees flexed throughout.    Ambulation/Gait Ambulation/Gait assistance: Contact guard assist Gait Distance (Feet): 18 Feet Assistive device: Rolling walker (2 wheels) Gait Pattern/deviations: Step-to pattern, Decreased step length - right, Decreased step length - left, Decreased stride length, Decreased dorsiflexion - left, Knee flexed in stance - right, Knee flexed in  stance - left, Drifts right/left, Trunk flexed   Gait velocity interpretation: <1.31 ft/sec, indicative of household ambulator   General Gait Details: Pt was able to move left LE and then right for up to 18 feet and then reported it was getting too difficult and brought chair to pt so he could sit.  Also needed assist to move RW.  Cues also given for technique/sequencing with pt frequenctly pushing RW too far in front of him. Discussed safety with pt at length and pt states he uses gait belt on RW and straps it so he can rest on RW.   Appears that pt may function marginally at home.   Stairs             Wheelchair Mobility     Tilt Bed    Modified Rankin (Stroke Patients Only)       Balance Overall balance assessment: Needs assistance Sitting-balance support: No upper extremity supported, Feet supported Sitting balance-Leahy Scale: Fair     Standing balance support: Bilateral upper extremity supported, During functional activity, Reliant on assistive device for balance Standing balance-Leahy Scale: Poor Standing balance comment: relies on UE support on RW                            Communication Communication Communication: No apparent difficulties  Cognition Arousal: Alert Behavior During Therapy: WFL for tasks assessed/performed   PT - Cognitive impairments: No apparent impairments                         Following commands: Intact      Cueing    Exercises General Exercises - Lower Extremity Ankle Circles/Pumps: AROM, Both, 10 reps, Supine Long Arc Quad: AROM, Both, 10 reps, Seated    General Comments General comments (skin integrity, edema, etc.): VSS      Pertinent Vitals/Pain Pain Assessment Pain Assessment: Faces Faces Pain Scale: Hurts little more Pain Location: generalized Pain Descriptors / Indicators: Aching, Discomfort, Grimacing, Guarding Pain Intervention(s): Limited activity within patient's tolerance, Monitored during session, Repositioned    Home Living                          Prior Function            PT Goals (current goals can now be found in the care plan section) Acute Rehab PT Goals Patient Stated Goal: to go home Progress towards PT goals: Progressing toward goals    Frequency    Min 2X/week      PT Plan      Co-evaluation              AM-PAC PT 6 Clicks Mobility   Outcome Measure  Help needed turning from your back to your side while in a flat bed without using bedrails?: A Lot Help needed  moving from lying on your back to sitting on the side of a flat bed without using bedrails?: Total Help needed moving to and from a bed to a chair (including a wheelchair)?: A Little Help needed standing up from a chair using your arms (e.g., wheelchair or bedside chair)?: A Little Help needed to walk in hospital room?: A Lot Help needed climbing 3-5 steps with a railing? : Total 6 Click Score: 12    End of Session Equipment Utilized During Treatment: Gait belt Activity Tolerance: Patient limited by fatigue Patient left: in chair;with  call bell/phone within reach;with chair alarm set Nurse Communication: Mobility status PT Visit Diagnosis: Muscle weakness (generalized) (M62.81)     Time: 8845-8780 PT Time Calculation (min) (ACUTE ONLY): 25 min  Charges:    $Gait Training: 8-22 mins $Therapeutic Exercise: 8-22 mins PT General Charges $$ ACUTE PT VISIT: 1 Visit                     Luis Poole,PT Acute Rehab Services [phone]    Luis Poole 07/13/2024, 1:07 PM

## 2024-07-13 NOTE — TOC Progression Note (Signed)
 Transition of Care [facility]) - Progression Note    Patient Details  Name: Luis Poole MRN: [id number] Date of Birth: 1941-07-23  [facility] CM/SW Contact  Waddell Barnie Rama, RN Phone Number: 07/13/2024, 11:36 AM  Clinical Narrative:    NCM confirmed w/chair at bedside with patient, NCM offered choice for HHPT, he does not have a preference.  [facility] accepted referral .  Soc will begin 24 to 48 hrs post dc.    Expected Discharge Plan: Home/Self Care Barriers to Discharge: Continued Medical Work up               Expected Discharge Plan and Services In-house Referral: NA Discharge Planning Services: CM Consult Post Acute Care Choice: NA Living arrangements for the past 2 months: Single Family Home                 DME Arranged: Wheelchair manual DME Agency: [employer] Date DME Agency Contacted: 07/12/24 Time DME Agency Contacted: 1531 Representative spoke with at DME Agency: [place] HH Arranged: NA           Social Drivers of Health (SDOH) Interventions SDOH Screenings   Food Insecurity: Food Insecurity Present (07/11/2024)  Housing: Low Risk  (07/11/2024)  Transportation Needs: Unmet Transportation Needs (07/12/2024)  Utilities: Not At Risk (07/12/2024)  Financial Resource Strain: Low Risk  (09/25/2023)   Received from [facility]  Physical Activity: Unknown (06/26/2023)   Received from [facility]  Social Connections: Moderately Integrated (07/11/2024)  Recent Concern: Social Connections - Moderately Isolated (06/08/2024)  Stress: Stress Concern Present (06/26/2023)   Received from [facility]  Tobacco Use: Medium Risk (07/10/2024)    Readmission Risk Interventions    07/12/2024    3:20 PM  Readmission Risk Prevention Plan  Transportation Screening Complete  Medication Review (RN Care Manager) Complete  PCP or Specialist appointment within 3-5 days of discharge Complete  HRI or Home Care Consult Complete  Palliative  Care Screening Not Applicable  Skilled Nursing Facility Not Applicable

## 2024-07-14 ENCOUNTER — Other Ambulatory Visit (HOSPITAL_COMMUNITY): Payer: Self-pay

## 2024-07-14 DIAGNOSIS — D649 Anemia, unspecified: Secondary | ICD-10-CM

## 2024-07-14 DIAGNOSIS — M109 Gout, unspecified: Secondary | ICD-10-CM

## 2024-07-14 DIAGNOSIS — Z8673 Personal history of transient ischemic attack (TIA), and cerebral infarction without residual deficits: Secondary | ICD-10-CM

## 2024-07-14 DIAGNOSIS — E1169 Type 2 diabetes mellitus with other specified complication: Secondary | ICD-10-CM

## 2024-07-14 DIAGNOSIS — N1831 Chronic kidney disease, stage 3a: Secondary | ICD-10-CM | POA: Diagnosis not present

## 2024-07-14 DIAGNOSIS — I25118 Atherosclerotic heart disease of native coronary artery with other forms of angina pectoris: Secondary | ICD-10-CM | POA: Diagnosis not present

## 2024-07-14 DIAGNOSIS — I10 Essential (primary) hypertension: Secondary | ICD-10-CM | POA: Diagnosis not present

## 2024-07-14 DIAGNOSIS — I5032 Chronic diastolic (congestive) heart failure: Secondary | ICD-10-CM | POA: Diagnosis not present

## 2024-07-14 LAB — GLUCOSE, CAPILLARY
Glucose-Capillary: 88 mg/dL (ref 70–99)
Glucose-Capillary: 109 mg/dL — ABNORMAL HIGH (ref 70–99)

## 2024-07-14 LAB — BASIC METABOLIC PANEL WITH GFR
Anion gap: 7 (ref 5–15)
BUN: 12 mg/dL (ref 8–23)
CO2: 24 mmol/L (ref 22–32)
Calcium: 8.6 mg/dL — ABNORMAL LOW (ref 8.9–10.3)
Chloride: 104 mmol/L (ref 98–111)
Creatinine, Ser: 1.18 mg/dL (ref 0.61–1.24)
GFR, Estimated: >60 mL/min (ref >60)
Glucose, Bld: 106 mg/dL — ABNORMAL HIGH (ref 70–99)
Potassium: 3.8 mmol/L (ref 3.5–5.1)
Sodium: 135 mmol/L (ref 135–145)

## 2024-07-14 LAB — CBC
HCT: 29.4 % — ABNORMAL LOW (ref 39.0–52.0)
Hemoglobin: 9.5 g/dL — ABNORMAL LOW (ref 13.0–17.0)
MCH: 27.7 pg (ref 26.0–34.0)
MCHC: 32.3 g/dL (ref 30.0–36.0)
MCV: 85.7 fL (ref 80.0–100.0)
Platelets: 115 K/uL — ABNORMAL LOW (ref 150–400)
RBC: 3.43 MIL/uL — ABNORMAL LOW (ref 4.22–5.81)
RDW: 13.5 % (ref 11.5–15.5)
WBC: 4.8 K/uL (ref 4.0–10.5)
nRBC: 0 % (ref 0.0–0.2)

## 2024-07-14 LAB — MAGNESIUM: Magnesium: 1.8 mg/dL (ref 1.7–2.4)

## 2024-07-14 MED ORDER — RANOLAZINE ER 500 MG PO TB12
500.0000 mg | ORAL_TABLET | Freq: Two times a day (BID) | ORAL | 0 refills | Status: AC
  Filled 2024-07-14: qty 60, 30d supply, fill #0

## 2024-07-14 MED ORDER — NITROGLYCERIN 0.4 MG SL SUBL: 0.4000 mg | SUBLINGUAL_TABLET | SUBLINGUAL | Status: DC | PRN

## 2024-07-14 MED ORDER — NITROGLYCERIN 0.4 MG SL SUBL
0.4000 mg | SUBLINGUAL_TABLET | SUBLINGUAL | 0 refills | Status: AC | PRN
  Filled 2024-07-14: qty 25, 5d supply, fill #0

## 2024-07-14 MED ORDER — ISOSORBIDE MONONITRATE ER 60 MG PO TB24
60.0000 mg | ORAL_TABLET | Freq: Every day | ORAL | 0 refills | Status: AC
  Filled 2024-07-14: qty 30, 30d supply, fill #0

## 2024-07-14 NOTE — TOC Transition Note (Signed)
 Transition of Care (TOC) - Discharge Note Rayfield Gobble RN, BSN Inpatient Care Management Unit 4E- RN Case Manager See Treatment Team for direct phone # [phone] cross coverage  Patient Details  Name: BARNABY RIPPEON MRN: [id number] Date of Birth: 1940/11/14  Transition of Care [facility]) CM/SW Contact:  Gobble Rayfield Hurst, RN Phone Number: 07/14/2024, 3:23 PM   Clinical Narrative:    Pt stable for transition home today, new wheelchair has been delivered to bedside by [employer].   Per previous CM note- HHPT has been set up with [facility]- msg sent to [facility] liaison for start of care. Referral confirmed.   CM spoke with pt at bedside for transportation needs- per pt his sister can transport home as long as it's before dark as she does not drive after dark. CM informed pt to go ahead and call sister to come get him- per pt sister is only about 12 min away. Bedside Rn updated.  Should any further assistance be needed- d/c lounge can assist with safe transport home via wheelchair van.   IP CM interventions have been completed no further needs noted.   Final next level of care: Home w Home Health Services Barriers to Discharge: Barriers Resolved   Patient Goals and CMS Choice Patient states their goals for this hospitalization and ongoing recovery are:: return home   Choice offered to / list presented to : Patient      Discharge Placement                Home w/ [facility]      Discharge Plan and Services Additional resources added to the After Visit Summary for   In-house Referral: NA Discharge Planning Services: CM Consult Post Acute Care Choice: Home Health, Durable Medical Equipment          DME Arranged: Wheelchair manual DME Agency: [employer] Date DME Agency Contacted: 07/12/24 Time DME Agency Contacted: 1531 Representative spoke with at DME Agency: [place] HH Arranged: PT HH Agency: [facility] Date [facility] Agency Contacted: 07/14/24 Time HH Agency  Contacted: 1440 Representative spoke with at [facility] Agency: [place]  Social Drivers of Health (SDOH) Interventions SDOH Screenings   Food Insecurity: Food Insecurity Present (07/11/2024)  Housing: Low Risk  (07/11/2024)  Transportation Needs: Unmet Transportation Needs (07/12/2024)  Utilities: Not At Risk (07/12/2024)  Financial Resource Strain: Low Risk  (09/25/2023)   Received from [facility]  Physical Activity: Unknown (06/26/2023)   Received from [facility]  Social Connections: Moderately Integrated (07/11/2024)  Recent Concern: Social Connections - Moderately Isolated (06/08/2024)  Stress: Stress Concern Present (06/26/2023)   Received from [facility]  Tobacco Use: Medium Risk (07/10/2024)     Readmission Risk Interventions    07/12/2024    3:20 PM  Readmission Risk Prevention Plan  Transportation Screening Complete  Medication Review (RN Care Manager) Complete  PCP or Specialist appointment within 3-5 days of discharge Complete  HRI or Home Care Consult Complete  Palliative Care Screening Not Applicable  Skilled Nursing Facility Not Applicable

## 2024-07-14 NOTE — Assessment & Plan Note (Signed)
 No acute flare, continue with allopurinol  and as needed colchicine 

## 2024-07-14 NOTE — Assessment & Plan Note (Signed)
 Patient was placed on insulin  sliding scale during his hospitalization Glucose remained stable.   Plan for diet control as outpatient Continue statin therapy

## 2024-07-14 NOTE — Assessment & Plan Note (Signed)
 Patient with no acute coronary syndrome, unstable angina has been ruled out.  Per prior coronary angiography Unable to cross into OM1 due to severe lesion and the angulated takeoff. The smaller OM 2 branch was also subtotally occluded. Not very favorable for PCI.   Plan to continue medical therapy with dual antiplatelet therapy with aspirin  and clopidogrel  for 12 months. Continue isosorbide , amlodipine , and ranolazine .  As needed nitroglycerin .  Continue statin therapy.

## 2024-07-14 NOTE — Discharge Summary (Signed)
 Physician Discharge Summary   Patient: Luis Poole MRN: [id number] DOB: 01-26-1941  Admit date:     07/10/2024  Discharge date: 07/14/24  Discharge Physician: Elidia Sieving Shila Kruczek   PCP: Pura Lenis, MD   Recommendations at discharge:    Patient will continue medical therapy for coronary artery disease, dual antiplatelet therapy and statin. Added ranolazine  and isosorbide . As needed nitroglycerin .  Discontinue sildenafil.  Continue blood pressure control with amlodipine  and valsartan .  Follow up renal function and electrolytes in 7 to 10 days as outpatient  Follow up with Dr Pura in 7 to 10 days Follow up with Cardiology as scheduled.   Discharge Diagnoses: Principal Problem:   CAD (coronary artery disease) Active Problems:   Chest pain   History of CVA (cerebrovascular accident)   Hypertension   Diabetes mellitus type 2 in nonobese (HCC)   Normocytic anemia   Chronic kidney disease, stage 3a (HCC)   Chronic diastolic heart failure (HCC)   Hypokalemia   Presence of drug coated stent in LAD coronary artery  Resolved Problems:   * No resolved hospital problems. [facility] Course: Mr. Arduini was admitted to the hospital with the working diagnosis of chest pain.   83 yo male with past medical history of coronary artery disease, CVA, chronic left foot drop, CKD, T2DM, hypertension and heart failure who presented with chest pain.   Recent NSTEMI and was hospitalized 06/07/2024 to 06/18/2024. Patient had left heart cath and was found to have lesion 1 mid LAD 90% stenosis. DES was successfully placed in that LAD lesion. Lesion 2 first diagonal demonstrated 90% stenosis the patient had a DES placed there as well.  Lesion 3 mid circumflex lesion was 99% stenosis; balloon angioplasty performed with residual 50% stenosis.  Lesion 4 first marginal 99% stenosed with balloon angioplasty attempted but unable to cross the lesion and post intervention remained 99% residual stenosis  without being able to advance a second wire.  Distal circumflex lesion 100% stenosis beyond second marginal. Patient was discharged with instructions to continue medical management with aspirin  and clopidogrel  for minimum 12 months; plan was for potential repeat PCI if necessary in the future.  Unfortunately at home he had recurrent chest pain, while getting into an argument with his son. He has poor mobility and no frank chest pain with exertion.   On his initial physical examination his blood pressure was 144/63, HR 59, RR 20 and 02 saturation 100%  Lungs with no wheezing or rales, heart with S1 and S2 present and regular, abdomen with no distention and no lower extremity edema.   Na 138, K 3.3 Cl 105 bicarbonate 21 glucose 133 bun 15 cr 1,15  Wbc 3.6 hgb 10.8 plt 147  High sensitive troponin 6 and 5   Chest radiograph with hypoinflation, with no cardiomegaly, no infiltrates and no effusions. Spine stimulator in place.   EKG 70 bpm, normal axis, qtc 464, right bundle branch block, sinus rhythm with no significant ST segment or T wave changes.   Patient was placed on medical therapy with improvement in his symptoms.   Assessment and Plan: * CAD (coronary artery disease) Patient with no acute coronary syndrome, unstable angina has been ruled out.  Per prior coronary angiography Unable to cross into OM1 due to severe lesion and the angulated takeoff. The smaller OM 2 branch was also subtotally occluded. Not very favorable for PCI.   Plan to continue medical therapy with dual antiplatelet therapy with aspirin  and clopidogrel  for 12  months. Continue isosorbide , amlodipine , and ranolazine .  As needed nitroglycerin .  Continue statin therapy.  Hypertension Continue blood pressure control with valsartan  and amlodipine .   Chronic diastolic heart failure (HCC) Echocardiogram with preserved LV systolic function with EF 65 to 70%, possible basal inferior and inferolateral hypokinesis. RV  systolic function preserved, trivial pericardial effusion, no significant valvular disease.   Clinically euvolemic with no signs of exacerbation.   Plan to continue blood pressure control with valsartan .  Not on diuretic therapy.  Follow up as outpatient.   Chronic kidney disease, stage 3a (HCC) Hypokalemia.   Renal function stable, his serum cr at the time of discharge is 1,18 with K at 3,8 and serum bicarbonate at 24  Na 135 and Mg 1,8   Plan to continue follow up renal function as outpatient.   Type 2 diabetes mellitus with hyperlipidemia (HCC) Patient was placed on insulin  sliding scale during his hospitalization Glucose remained stable.   Plan for diet control as outpatient Continue statin therapy   Normocytic anemia Anemia of chronic disease.  Stable hgb  Follow up as outpatient   History of CVA (cerebrovascular accident) Continue blood pressure control, continue antiplatelet therapy and statin   Gout No acute flare, continue with allopurinol  and as needed colchicine        Consultants: cardiology  Procedures performed: none   Disposition: Home Diet recommendation:  Cardiac diet DISCHARGE MEDICATION: Allergies as of 07/14/2024       Reactions   Ibuprofen  Itching   Lisinopril Other (See Comments)   Swollen lips   Pregabalin  Swelling   Recently filled 11/24 for 90ds and reports taking on 08/11/23   Gabapentin  Other (See Comments)   Dizziness        Medication List     TAKE these medications    allopurinol  100 MG tablet Commonly known as: ZYLOPRIM  Take 100 mg by mouth daily as needed (for gout flareups).   amLODipine  10 MG tablet Commonly known as: NORVASC  Take 0.5 tablets (5 mg total) by mouth daily.   aspirin  EC 81 MG tablet Take 1 tablet (81 mg total) by mouth daily.   atorvastatin  80 MG tablet Commonly known as: LIPITOR  Take 1 tablet (80 mg total) by mouth daily.   clopidogrel  75 MG tablet Commonly known as: PLAVIX  Take 1 tablet (75  mg total) by mouth daily with breakfast.   colchicine  0.6 MG tablet Take 1 tablet (0.6 mg total) daily by mouth. What changed:  when to take this reasons to take this   cyclobenzaprine  5 MG tablet Commonly known as: FLEXERIL  Take 1 tablet (5 mg total) by mouth 2 (two) times daily as needed for muscle spasms.   ferrous sulfate  325 (65 FE) MG tablet Take 325 mg by mouth daily with breakfast.   isosorbide  mononitrate 60 MG 24 hr tablet Commonly known as: IMDUR  Take 1 tablet (60 mg total) by mouth daily. Start taking on: July 15, 2024   lidocaine  5 % Commonly known as: Lidoderm  Place 1 patch onto the skin daily. Remove & Discard patch within 12 hours or as directed by MD   meclizine  12.5 MG tablet Commonly known as: ANTIVERT  Take 12.5 mg by mouth 3 (three) times daily as needed for dizziness.   nitroGLYCERIN  0.4 MG SL tablet Commonly known as: NITROSTAT  Place 1 tablet (0.4 mg total) under the tongue every 5 (five) minutes as needed for chest pain (if need more than 2 doses, please call your doctor.).   oxycodone  5 MG capsule Commonly known  as: OXY-IR Take 5 mg by mouth every 4 (four) hours as needed for pain.   polyethylene glycol 17 g packet Commonly known as: MIRALAX  / GLYCOLAX  Take 17 g by mouth daily as needed for mild constipation.   potassium chloride  10 MEQ tablet Commonly known as: KLOR-CON  M Take 10 mEq by mouth daily.   ranolazine  500 MG 12 hr tablet Commonly known as: RANEXA  Take 1 tablet (500 mg total) by mouth 2 (two) times daily.   valsartan  320 MG tablet Commonly known as: DIOVAN  Take 320 mg by mouth daily.               Durable Medical Equipment  (From admission, onward)           Start     Ordered   07/12/24 1515  For home use only DME lightweight manual wheelchair with seat cushion  Once       Comments: Patient suffers from weakness which impairs their ability to perform daily activities like bathing, dressing, grooming, and  toileting in the home.  A cane or walker will not resolve  issue with performing activities of daily living. A wheelchair will allow patient to safely perform daily activities. Patient is not able to propel themselves in the home using a standard weight wheelchair due to general weakness. Patient can self propel in the lightweight wheelchair. Length of need Lifetime. Accessories: elevating leg rests (ELRs), wheel locks, extensions and anti-tippers.   07/12/24 1515            Follow-up Information     Pura Lenis, MD Follow up on 07/27/2024.   Specialty: Family Medicine Why: 3:00 pm for hospital follow up Contact information: [address] [place] [address] [phone]         [employer] (DME) Follow up.   Specialty: DME Services Why: light weight w/chair Contact information: [address] [employer] [place]  [phone]               Discharge Exam: Fredricka Weights   07/11/24 1555  Weight: 84.4 kg   BP 138/79 (BP Location: Left Arm)   Pulse 70   Temp 99.5 F (37.5 C) (Oral)   Resp (!) 22   Ht 5' 7 (1.702 m)   Wt 84.4 kg   SpO2 100%   BMI 29.14 kg/m   Patient is feeling well, no chest pain and no dyspnea.   Neurology awake and alert ENT with no pallor or icterus Cardiovascular with S1 and S2 present and regular with no gallops, rubs or murmurs Respiratory with no rales or wheezing, no rhonchi  Abdomen with no distention  No lower extremity edema.   Condition at discharge: stable  The results of significant diagnostics from this hospitalization (including imaging, microbiology, ancillary and laboratory) are listed below for reference.   Imaging Studies: DG Chest 2 View Result Date: 07/10/2024 EXAM: 2 VIEW(S) XRAY OF THE CHEST 07/10/2024 10:14:00 PM COMPARISON: 06/07/2024 CLINICAL HISTORY: cp cp FINDINGS: LINES, TUBES AND DEVICES: Spinal cord stimulator remains in place, unchanged.  LUNGS AND PLEURA: No focal pulmonary opacity. No pleural effusion. No pneumothorax. HEART AND MEDIASTINUM: No acute abnormality of the cardiac and mediastinal silhouettes. BONES AND SOFT TISSUES: No acute osseous abnormality. IMPRESSION: 1. No acute cardiopulmonary process. Electronically signed by: Franky Crease MD 07/10/2024 10:28 PM EST RP Workstation: [id number]    Microbiology: Results for orders placed or performed during the hospital encounter of 11/22/23  Urine Culture  Status: Abnormal   Collection Time: 11/23/23  1:28 AM   Specimen: Urine, Clean Catch  Result Value Ref Range Status   Specimen Description   Final    URINE, CLEAN CATCH Performed at [facility], [address] [place], [place] [address]    Special Requests   Final    NONE Performed at [facility], [address], [place], [place] [address]    Culture MULTIPLE SPECIES PRESENT, SUGGEST RECOLLECTION (A)  Final   Report Status 11/24/2023 FINAL  Final    Labs: CBC: Recent Labs  Lab 07/10/24 2150 07/13/24 0304 07/14/24 0230  WBC 3.6* 5.1 4.8  HGB 10.8* 11.0* 9.5*  HCT 33.9* 33.5* 29.4*  MCV 88.5 85.0 85.7  PLT 147* 131* 115*   Basic Metabolic Panel: Recent Labs  Lab 07/10/24 2150 07/13/24 0304 07/14/24 0230  NA 138 138 135  K 3.3* 3.9 3.8  CL 105 105 104  CO2 21* 23 24  GLUCOSE 133* 92 106*  BUN 15 14 12   CREATININE 1.15 1.18 1.18  CALCIUM  9.2 9.0 8.6*  MG  --  1.9 1.8   Liver Function Tests: Recent Labs  Lab 07/10/24 2150  AST 18  ALT 12  ALKPHOS 70  BILITOT 0.4  PROT 7.3  ALBUMIN 3.4*   CBG: Recent Labs  Lab 07/13/24 1126 07/13/24 1513 07/13/24 2142 07/14/24 0638 07/14/24 1216  GLUCAP 114* 117* 144* 88 109*    Discharge time spent: greater than 30 minutes.  Signed: Elidia Toribio Furnace, MD [facility] 07/14/2024

## 2024-07-17 ENCOUNTER — Inpatient Hospital Stay (HOSPITAL_COMMUNITY)
Admission: EM | Admit: 2024-07-17 | Discharge: 2024-07-20 | DRG: 920 | Disposition: A | Attending: Family Medicine | Admitting: Family Medicine

## 2024-07-17 ENCOUNTER — Emergency Department (HOSPITAL_COMMUNITY)

## 2024-07-17 ENCOUNTER — Other Ambulatory Visit: Payer: Self-pay

## 2024-07-17 ENCOUNTER — Encounter (HOSPITAL_COMMUNITY): Payer: Self-pay

## 2024-07-17 DIAGNOSIS — L03113 Cellulitis of right upper limb: Secondary | ICD-10-CM | POA: Diagnosis present

## 2024-07-17 DIAGNOSIS — Z8679 Personal history of other diseases of the circulatory system: Secondary | ICD-10-CM

## 2024-07-17 DIAGNOSIS — D638 Anemia in other chronic diseases classified elsewhere: Secondary | ICD-10-CM | POA: Diagnosis present

## 2024-07-17 DIAGNOSIS — E119 Type 2 diabetes mellitus without complications: Secondary | ICD-10-CM | POA: Diagnosis not present

## 2024-07-17 DIAGNOSIS — M7989 Other specified soft tissue disorders: Secondary | ICD-10-CM

## 2024-07-17 DIAGNOSIS — E782 Mixed hyperlipidemia: Secondary | ICD-10-CM

## 2024-07-17 DIAGNOSIS — R52 Pain, unspecified: Secondary | ICD-10-CM

## 2024-07-17 DIAGNOSIS — S40021A Contusion of right upper arm, initial encounter: Secondary | ICD-10-CM | POA: Diagnosis not present

## 2024-07-17 DIAGNOSIS — R2231 Localized swelling, mass and lump, right upper limb: Secondary | ICD-10-CM | POA: Diagnosis not present

## 2024-07-17 DIAGNOSIS — E785 Hyperlipidemia, unspecified: Secondary | ICD-10-CM | POA: Diagnosis present

## 2024-07-17 HISTORY — DX: Atherosclerotic heart disease of native coronary artery without angina pectoris: I25.10

## 2024-07-17 LAB — CBC WITH DIFFERENTIAL/PLATELET
Abs Immature Granulocytes: 0.03 K/uL (ref 0.00–0.07)
Basophils Absolute: 0 K/uL (ref 0.0–0.1)
Basophils Relative: 0 %
Eosinophils Absolute: 0 K/uL (ref 0.0–0.5)
Eosinophils Relative: 1 %
HCT: 32.6 % — ABNORMAL LOW (ref 39.0–52.0)
Hemoglobin: 10.5 g/dL — ABNORMAL LOW (ref 13.0–17.0)
Immature Granulocytes: 0 %
Lymphocytes Relative: 26 %
Lymphs Abs: 1.8 K/uL (ref 0.7–4.0)
MCH: 28 pg (ref 26.0–34.0)
MCHC: 32.2 g/dL (ref 30.0–36.0)
MCV: 86.9 fL (ref 80.0–100.0)
Monocytes Absolute: 0.7 K/uL (ref 0.1–1.0)
Monocytes Relative: 10 %
Neutro Abs: 4.3 K/uL (ref 1.7–7.7)
Neutrophils Relative %: 63 %
Platelets: 175 K/uL (ref 150–400)
RBC: 3.75 MIL/uL — ABNORMAL LOW (ref 4.22–5.81)
RDW: 13.7 % (ref 11.5–15.5)
WBC: 6.9 K/uL (ref 4.0–10.5)
nRBC: 0 % (ref 0.0–0.2)

## 2024-07-17 LAB — C-REACTIVE PROTEIN: CRP: 14 mg/dL — ABNORMAL HIGH (ref <1.0)

## 2024-07-17 LAB — BASIC METABOLIC PANEL WITH GFR
Anion gap: 12 (ref 5–15)
BUN: 15 mg/dL (ref 8–23)
CO2: 20 mmol/L — ABNORMAL LOW (ref 22–32)
Calcium: 8.8 mg/dL — ABNORMAL LOW (ref 8.9–10.3)
Chloride: 103 mmol/L (ref 98–111)
Creatinine, Ser: 1.05 mg/dL (ref 0.61–1.24)
GFR, Estimated: >60 mL/min (ref >60)
Glucose, Bld: 102 mg/dL — ABNORMAL HIGH (ref 70–99)
Potassium: 3.9 mmol/L (ref 3.5–5.1)
Sodium: 135 mmol/L (ref 135–145)

## 2024-07-17 LAB — MAGNESIUM: Magnesium: 1.9 mg/dL (ref 1.7–2.4)

## 2024-07-17 LAB — GLUCOSE, CAPILLARY: Glucose-Capillary: 155 mg/dL — ABNORMAL HIGH (ref 70–99)

## 2024-07-17 LAB — SEDIMENTATION RATE: Sed Rate: 67 mm/h — ABNORMAL HIGH (ref 0–16)

## 2024-07-17 LAB — CBG MONITORING, ED: Glucose-Capillary: 175 mg/dL — ABNORMAL HIGH (ref 70–99)

## 2024-07-17 LAB — RESP PANEL BY RT-PCR (RSV, FLU A&B, COVID)  RVPGX2
Influenza A by PCR: NEGATIVE
Influenza B by PCR: NEGATIVE
Resp Syncytial Virus by PCR: NEGATIVE
SARS Coronavirus 2 by RT PCR: NEGATIVE

## 2024-07-17 MED ORDER — ACETAMINOPHEN 325 MG PO TABS: 650.0000 mg | ORAL_TABLET | Freq: Four times a day (QID) | ORAL | Status: DC | PRN

## 2024-07-17 MED ORDER — INSULIN ASPART 100 UNIT/ML IJ SOLN: 0.0000 [IU] | Freq: Every day | INTRAMUSCULAR | Status: DC

## 2024-07-17 MED ORDER — IOHEXOL 350 MG/ML SOLN
75.0000 mL | Freq: Once | INTRAVENOUS | Status: AC | PRN
  Administered 2024-07-17: 75 mL via INTRAVENOUS

## 2024-07-17 MED ORDER — ACETAMINOPHEN 650 MG RE SUPP: 650.0000 mg | Freq: Four times a day (QID) | RECTAL | Status: DC | PRN

## 2024-07-17 MED ORDER — ONDANSETRON HCL 4 MG/2ML IJ SOLN
4.0000 mg | Freq: Four times a day (QID) | INTRAMUSCULAR | Status: DC | PRN
  Administered 2024-07-19: 4 mg via INTRAVENOUS
  Filled 2024-07-17: qty 2

## 2024-07-17 MED ORDER — HYDROCODONE-ACETAMINOPHEN 5-325 MG PO TABS: 1.0000 | ORAL_TABLET | Freq: Four times a day (QID) | ORAL | Status: DC | PRN

## 2024-07-17 MED ORDER — MELATONIN 3 MG PO TABS: 3.0000 mg | ORAL_TABLET | Freq: Every evening | ORAL | Status: DC | PRN

## 2024-07-17 MED ORDER — FERROUS SULFATE 325 (65 FE) MG PO TABS
325.0000 mg | ORAL_TABLET | Freq: Every day | ORAL | Status: DC
  Administered 2024-07-18 – 2024-07-20 (×3): 325 mg via ORAL
  Filled 2024-07-17 (×3): qty 1

## 2024-07-17 MED ORDER — ACETAMINOPHEN 325 MG PO TABS
650.0000 mg | ORAL_TABLET | Freq: Once | ORAL | Status: AC
  Administered 2024-07-17: 650 mg via ORAL
  Filled 2024-07-17: qty 2

## 2024-07-17 MED ORDER — ASPIRIN 81 MG PO TBEC
81.0000 mg | DELAYED_RELEASE_TABLET | Freq: Every day | ORAL | Status: DC
  Administered 2024-07-18 – 2024-07-20 (×3): 81 mg via ORAL
  Filled 2024-07-17 (×3): qty 1

## 2024-07-17 MED ORDER — CLOPIDOGREL BISULFATE 75 MG PO TABS
75.0000 mg | ORAL_TABLET | Freq: Every day | ORAL | Status: DC
  Administered 2024-07-18 – 2024-07-20 (×3): 75 mg via ORAL
  Filled 2024-07-17 (×3): qty 1

## 2024-07-17 MED ORDER — ISOSORBIDE MONONITRATE ER 30 MG PO TB24
60.0000 mg | ORAL_TABLET | Freq: Every day | ORAL | Status: DC
  Administered 2024-07-18 – 2024-07-20 (×3): 60 mg via ORAL
  Filled 2024-07-17 (×3): qty 2

## 2024-07-17 MED ORDER — SODIUM CHLORIDE 0.9 % IV SOLN
1.0000 g | INTRAVENOUS | Status: DC
  Administered 2024-07-17 – 2024-07-19 (×3): 1 g via INTRAVENOUS
  Filled 2024-07-17 (×3): qty 10

## 2024-07-17 MED ORDER — VANCOMYCIN HCL 750 MG/150ML IV SOLN
750.0000 mg | Freq: Two times a day (BID) | INTRAVENOUS | Status: DC
  Filled 2024-07-17: qty 150

## 2024-07-17 MED ORDER — VANCOMYCIN HCL IN DEXTROSE 1-5 GM/200ML-% IV SOLN
1000.0000 mg | Freq: Once | INTRAVENOUS | Status: AC
  Administered 2024-07-17: 1000 mg via INTRAVENOUS
  Filled 2024-07-17: qty 200

## 2024-07-17 MED ORDER — IRBESARTAN 150 MG PO TABS
150.0000 mg | ORAL_TABLET | Freq: Every day | ORAL | Status: DC
  Administered 2024-07-18 – 2024-07-20 (×3): 150 mg via ORAL
  Filled 2024-07-17 (×3): qty 1

## 2024-07-17 MED ORDER — INSULIN ASPART 100 UNIT/ML IJ SOLN
0.0000 [IU] | Freq: Three times a day (TID) | INTRAMUSCULAR | Status: DC
  Filled 2024-07-17: qty 4
  Filled 2024-07-17: qty 5

## 2024-07-17 MED ORDER — RANOLAZINE ER 500 MG PO TB12
500.0000 mg | ORAL_TABLET | Freq: Two times a day (BID) | ORAL | Status: DC
  Administered 2024-07-18 – 2024-07-20 (×5): 500 mg via ORAL
  Filled 2024-07-17 (×5): qty 1

## 2024-07-17 MED ORDER — ATORVASTATIN CALCIUM 80 MG PO TABS
80.0000 mg | ORAL_TABLET | Freq: Every day | ORAL | Status: DC
  Administered 2024-07-17 – 2024-07-19 (×3): 80 mg via ORAL
  Filled 2024-07-17 (×3): qty 1

## 2024-07-17 MED ORDER — VANCOMYCIN HCL 500 MG/100ML IV SOLN
500.0000 mg | Freq: Once | INTRAVENOUS | Status: AC
  Administered 2024-07-17: 500 mg via INTRAVENOUS
  Filled 2024-07-17: qty 100

## 2024-07-17 NOTE — ED Notes (Addendum)
 Pt transported to US 

## 2024-07-17 NOTE — ED Triage Notes (Signed)
 Patient complains of 4 months of right hand swelling, no trauma. Last night increased pain to same. No warmth nor deformity noted

## 2024-07-17 NOTE — Progress Notes (Signed)
 Pharmacy Antibiotic Note  Luis Poole is a 84 y.o. male admitted on 07/17/2024 presenting with cellulitis.  Pharmacy has been consulted for vancomycin dosing.  Vancomycin 1g IV x 1 given in ED  Plan: Vancomycin 500 mg IV x 1 to complete loading dose, then 750 mg IV q 12h (eAUC 522) Monitor renal function, Cx and clinical progression to narrow Vancomycin levels as indicated     Temp (24hrs), Avg:99.8 F (37.7 C), Min:99.1 F (37.3 C), Max:100.4 F (38 C)  Recent Labs  Lab 07/10/24 2150 07/13/24 0304 07/14/24 0230 07/17/24 1631  WBC 3.6* 5.1 4.8 6.9  CREATININE 1.15 1.18 1.18 1.05    Estimated Creatinine Clearance: 57.3 mL/min (by C-G formula based on SCr of 1.05 mg/dL).    Allergies  Allergen Reactions   Ibuprofen  Itching   Lisinopril Other (See Comments)    Swollen lips   Pregabalin  Swelling    Recently filled 11/24 for 90ds and reports taking on 08/11/23   Gabapentin  Other (See Comments)    Dizziness    Dorn Poot, PharmD, [facility] Clinical Pharmacist ED Pharmacist Phone # [phone] 07/17/2024 9:25 PM

## 2024-07-17 NOTE — ED Notes (Signed)
 Unable to collect blood after 2 sticks. Phleb Jon to try

## 2024-07-17 NOTE — ED Provider Notes (Signed)
 Received pt at sign out from provider pending labs, US . See their note  Patient  presents to ED concerned for worsening right hand swelling and pain x3 days. Patient stating that the swelling was present after the cardiac catheterization, but he was still able to move it well.    Physical Exam  BP (!) 151/70   Pulse 63   Temp 99.1 F (37.3 C)   Resp 17   SpO2 100%   Physical Exam Vitals and nursing note reviewed.  Constitutional:      General: He is not in acute distress.    Appearance: Normal appearance.  HENT:     Head: Normocephalic and atraumatic.  Eyes:     Conjunctiva/sclera: Conjunctivae normal.  Cardiovascular:     Rate and Rhythm: Normal rate.  Pulmonary:     Effort: Pulmonary effort is normal. No respiratory distress.  Musculoskeletal:     Comments: Gross swelling to right hand. Increased warmth when compared to left hand but was under covers prior to exam which could be contributing. No erythema nor streaking. Generalized tenderness  Skin:    Coloration: Skin is not jaundiced or pale.  Neurological:     Mental Status: He is alert. Mental status is at baseline.        ED Course / MDM    Medical Decision Making Amount and/or Complexity of Data Reviewed Labs: ordered. Radiology: ordered.  Risk OTC drugs. Prescription drug management. Decision regarding hospitalization.   Consulted Dr. Kennyth regarding patient who recommended venous and arterial doppler to ensure no right radial hematoma, thrombus, or occulusion as etiology of swelling.  Dr. Otelia with cardiology individually assessed patient and recommends CTA of RUE and hand surgery consult  Dr. Delene with hand surgery consulted who reviewed ED workup recommends CTA of RUE, IV abx, hospitalist admission for cellulitis  Consulted hospitalist Dr. Marcene and reviewed ED workup, dispo. Accept patient for admission   Minnie Tinnie BRAVO, [place] 07/17/24 2112    Dasie Faden, MD 07/20/24 [phone]

## 2024-07-17 NOTE — ED Notes (Signed)
 CCMD called for cardiac monitoring.

## 2024-07-17 NOTE — Progress Notes (Signed)
 VASCULAR LAB    Right upper extremity arterial duplex has been performed.  See CV proc for preliminary results.   Anairis Knick, RVT 07/17/2024, 5:49 PM

## 2024-07-17 NOTE — ED Provider Notes (Signed)
 [facility] Provider Note   CSN: [id number] Arrival date & time: 07/17/24  1356     Patient presents with: No chief complaint on file.   Luis Poole is a 83 y.o. male with PMHx CHF, CKD, DM, HTN, CVA, CAD with recent cardiac cath 06/11/2024 who presents to ED concerned for worsening right hand swelling and pain x3 days. Patient stating that the swelling was present after the cardiac catheterization, but he was still able to move it well.  Patient stating that he was told by ice on it.  Patient concerned because symptoms are getting worse.  Patient denies any infectious symptoms such as fever, chest pain, cough, SOB, nausea, vomiting, diarrhea.   HPI     Prior to Admission medications   Medication Sig Start Date End Date Taking? Authorizing Provider  allopurinol  (ZYLOPRIM ) 100 MG tablet Take 100 mg by mouth daily as needed (for gout flareups).    [provider]  amLODipine  (NORVASC ) 10 MG tablet Take 0.5 tablets (5 mg total) by mouth daily. 08/12/23   Danford, Lonni SHAUNNA, MD  aspirin  EC 81 MG tablet Take 1 tablet (81 mg total) by mouth daily. 06/12/24   Drusilla Sabas RAMAN, MD  atorvastatin  (LIPITOR ) 80 MG tablet Take 1 tablet (80 mg total) by mouth daily. 06/13/24   Drusilla Sabas RAMAN, MD  clopidogrel  (PLAVIX ) 75 MG tablet Take 1 tablet (75 mg total) by mouth daily with breakfast. 06/13/24   Drusilla Sabas RAMAN, MD  colchicine  0.6 MG tablet Take 1 tablet (0.6 mg total) daily by mouth. Patient taking differently: Take 0.6 mg by mouth daily as needed (gout pain). 06/29/17   Dolan Mateo Larger, MD  cyclobenzaprine  (FLEXERIL ) 5 MG tablet Take 1 tablet (5 mg total) by mouth 2 (two) times daily as needed for muscle spasms. 05/25/24   Trine Raynell Moder, MD  ferrous sulfate  325 (65 FE) MG tablet Take 325 mg by mouth daily with breakfast.    [provider]  isosorbide  mononitrate (IMDUR ) 60 MG 24 hr tablet Take 1 tablet (60 mg total) by  mouth daily. 07/15/24   Arrien, Elidia Sieving, MD  lidocaine  (LIDODERM ) 5 % Place 1 patch onto the skin daily. Remove & Discard patch within 12 hours or as directed by MD 03/31/24   Barrett, Jamie N, PA-C  meclizine  (ANTIVERT ) 12.5 MG tablet Take 12.5 mg by mouth 3 (three) times daily as needed for dizziness.    [provider]  nitroGLYCERIN  (NITROSTAT ) 0.4 MG SL tablet Place 1 tablet (0.4 mg total) under the tongue every 5 (five) minutes as needed for chest pain (if need more than 2 doses, please call your doctor.). 07/14/24   Arrien, Elidia Sieving, MD  oxycodone  (OXY-IR) 5 MG capsule Take 5 mg by mouth every 4 (four) hours as needed for pain.    [provider]  polyethylene glycol (MIRALAX  / GLYCOLAX ) 17 g packet Take 17 g by mouth daily as needed for mild constipation. 11/29/22   Rizwan, Saima, MD  potassium chloride  (KLOR-CON  M) 10 MEQ tablet Take 10 mEq by mouth daily. 07/06/24   [provider]  ranolazine  (RANEXA ) 500 MG 12 hr tablet Take 1 tablet (500 mg total) by mouth 2 (two) times daily. 07/14/24   Arrien, Elidia Sieving, MD  valsartan  (DIOVAN ) 320 MG tablet Take 320 mg by mouth daily. 03/29/24   [provider]    Allergies: Ibuprofen , Lisinopril, Pregabalin , and Gabapentin     Review of  Systems  Musculoskeletal:        Hand swelling    Updated Vital Signs BP (!) 180/86   Pulse 75   Temp (!) 100.4 F (38 C) (Oral)   Resp 18   SpO2 94%   Physical Exam Vitals and nursing note reviewed.  Constitutional:      General: He is not in acute distress.    Appearance: He is not ill-appearing or toxic-appearing.  HENT:     Head: Normocephalic and atraumatic.     Mouth/Throat:     Mouth: Mucous membranes are moist.  Eyes:     General: No scleral icterus.       Right eye: No discharge.        Left eye: No discharge.     Conjunctiva/sclera: Conjunctivae normal.  Cardiovascular:     Rate and Rhythm: Normal rate and regular rhythm.      Pulses: Normal pulses.     Heart sounds: Normal heart sounds. No murmur heard. Pulmonary:     Effort: Pulmonary effort is normal. No respiratory distress.     Breath sounds: Normal breath sounds. No wheezing, rhonchi or rales.  Abdominal:     General: Abdomen is flat.  Musculoskeletal:     Right lower leg: No edema.     Left lower leg: No edema.     Comments: Right hand moderately swollen.  No increased warmth or erythema.  +2 radial pulse.  Sensation light touch intact.  Brisk capillary refill.  Active ROM restricted.  Skin:    General: Skin is warm and dry.     Findings: No rash.  Neurological:     General: No focal deficit present.     Mental Status: He is alert. Mental status is at baseline.  Psychiatric:        Mood and Affect: Mood normal.     (all labs ordered are listed, but only abnormal results are displayed) Labs Reviewed  RESP PANEL BY RT-PCR (RSV, FLU A&B, COVID)  [id number]  CBC WITH DIFFERENTIAL/PLATELET  BASIC METABOLIC PANEL WITH GFR    EKG: None  Radiology: No results found.   Procedures   Medications Ordered in the ED - No data to display                                  Medical Decision Making Amount and/or Complexity of Data Reviewed Labs: ordered. Radiology: ordered.  Risk OTC drugs.   This patient presents to the ED for concern of right hand pain, this involves an extensive number of treatment options, and is a complaint that carries with it a high risk of complications and morbidity.  The differential diagnosis includes hemarthrosis, gout, septic joint, fracture, tendonitis, carpal tunnel syndrome, muscle strain, bursitis, compartment syndrome   Co morbidities that complicate the patient evaluation  CHF, CKD, DM, HTN, CVA, CAD with recent cardiac cath 06/11/2024   Additional history obtained:  06/11/2024 coronary stent intervention: Successful DES PCI to the mid LAD with a 2.5 mm x 16 mm Synergy XD deployed a 2.75 mm reduced into the  lesion to 0% Successful direct stent DES PCI of diagonal branch with Synergy XD 2.25 mm x 12 mm deployed to 2.4 mm. PTCA only of mid LCx into OM1 wrist reducing lesion to 50% Unsuccessful PTCA/PCI of OM1-unable to cross lesion with a 1 5 balloon no C due to tortuosity and the actual location of the lesion.  Was not able to advance a second wire across the lesion.     Problem List / ED Course / Critical interventions / Medication management  Patient presents ED concern for right hand pain and swelling that has been present for at least 1 month but worsening over the past 3 days.  Physical exam notable for fever at 100.4 F.  Patient denies any recent infectious symptoms.  Physical exam without erythema or increased warmth.  There is moderate swelling of right hand and restricted active ROM.  Rest of physical exam reassuring. I ordered imaging studies including VAS US  upper extremity.  This imaging result is pending.   I have reviewed the patients home medicines and have made adjustments as needed   Social Determinants of Health:  geriatric  3PM Care of Luis Poole transferred to [facility] at the end of my shift as the patient will require reassessment once labs/imaging have resulted. Patient presentation, ED course, and plan of care discussed with review of all pertinent labs and imaging. Please see his/her note for further details regarding further ED course and disposition. Plan at time of handoff is reassess patient after lab workup and imaging. This may be altered or completely changed at the discretion of the oncoming team pending results of further workup.      Final diagnoses:  None    ED Discharge Orders     None          Hoy Nidia FALCON, [place] 07/17/24 1511    Ellouise Richerd POUR, [place] 07/17/24 1523

## 2024-07-17 NOTE — Consult Note (Addendum)
 Cardiology Consultation   Patient ID: Luis Poole MRN: [id number]; DOB: 1941-08-19  Admit date: 07/17/2024 Date of Consult: 07/17/2024  PCP:  Pura Lenis, MD   [facility] Providers Cardiologist:  Georganna Archer, MD        Patient Profile: Luis Poole is a 83 y.o. male with a hx of CAD s/p PCI to mLAD, D1, mLCx, and OM1 on 06/11/2024, CVA, chronic left foot drop, T2DM, and hypertension who is being seen 07/17/2024 for the evaluation of right hand swelling following right radial access LHC on 06/12/2024.   History of Present Illness: Luis Poole tells me that following his LHC, he started to have mild discomfort and swelling at the access site. This has gotten worse since discharge to a point where he has not been able to open or close his hand due to significant pain.  He also reports numbness in the hand.  He has tried an OTC analygsic cream with no significant improvement. In the ED today, he underwent upper extremity arterial and venous Doppler, which showed no evidence of obstruction the right upper extremity, DVT, superficial venous thrombosis; however, it noted subcutaneous edema throughout the forearm and hand radial and ulnar arteries were patent with normal waveform.  He is not on anticoagulation but he is on aspirin  and Plavix  for his recent PCI.   Past Medical History:  Diagnosis Date   Anemia, unspecified 06/08/2013   Cataract    CHF (congestive heart failure) (HCC)    Chronic kidney disease    Diabetes mellitus    Foot drop, left foot    AFO   Hypertension    PAD (peripheral artery disease)    S/P lumbar fusion    Sequelae of cerebral infarction    Stroke [facility])     Past Surgical History:  Procedure Laterality Date   CATARACT EXTRACTION     CORONARY STENT INTERVENTION N/A 06/11/2024   Procedure: CORONARY STENT INTERVENTION;  Surgeon: Anner Lenis ORN, MD;  Location: [facility] INVASIVE CV LAB;  Service: Cardiovascular;  Laterality: N/A;   EYE SURGERY      KNEE SURGERY  2006   LEFT HEART CATH AND CORONARY ANGIOGRAPHY N/A 06/08/2024   Procedure: LEFT HEART CATH AND CORONARY ANGIOGRAPHY;  Surgeon: Ladona Heinz, MD;  Location: [facility] INVASIVE CV LAB;  Service: Cardiovascular;  Laterality: N/A;   NECK SURGERY  2012   SPINE SURGERY  2009   TONSILECTOMY, ADENOIDECTOMY, BILATERAL MYRINGOTOMY AND TUBES       Home Medications:  Prior to Admission medications   Medication Sig Start Date End Date Taking? Authorizing Provider  allopurinol  (ZYLOPRIM ) 100 MG tablet Take 100 mg by mouth daily as needed (for gout flareups).    [provider]  amLODipine  (NORVASC ) 10 MG tablet Take 0.5 tablets (5 mg total) by mouth daily. 08/12/23   Danford, Lonni SHAUNNA, MD  aspirin  EC 81 MG tablet Take 1 tablet (81 mg total) by mouth daily. 06/12/24   Drusilla Sabas RAMAN, MD  atorvastatin  (LIPITOR ) 80 MG tablet Take 1 tablet (80 mg total) by mouth daily. 06/13/24   Drusilla Sabas RAMAN, MD  clopidogrel  (PLAVIX ) 75 MG tablet Take 1 tablet (75 mg total) by mouth daily with breakfast. 06/13/24   Drusilla Sabas RAMAN, MD  colchicine  0.6 MG tablet Take 1 tablet (0.6 mg total) daily by mouth. Patient taking differently: Take 0.6 mg by mouth daily as needed (gout pain). 06/29/17   Dolan Mateo Larger, MD  cyclobenzaprine  (FLEXERIL ) 5 MG tablet Take 1  tablet (5 mg total) by mouth 2 (two) times daily as needed for muscle spasms. 05/25/24   Trine Raynell Moder, MD  ferrous sulfate  325 (65 FE) MG tablet Take 325 mg by mouth daily with breakfast.    [provider]  isosorbide  mononitrate (IMDUR ) 60 MG 24 hr tablet Take 1 tablet (60 mg total) by mouth daily. 07/15/24   Arrien, Elidia Sieving, MD  lidocaine  (LIDODERM ) 5 % Place 1 patch onto the skin daily. Remove & Discard patch within 12 hours or as directed by MD 03/31/24   Barrett, Jamie N, PA-C  meclizine  (ANTIVERT ) 12.5 MG tablet Take 12.5 mg by mouth 3 (three) times daily as needed for dizziness.    [provider]   nitroGLYCERIN  (NITROSTAT ) 0.4 MG SL tablet Place 1 tablet (0.4 mg total) under the tongue every 5 (five) minutes as needed for chest pain (if need more than 2 doses, please call your doctor.). 07/14/24   Arrien, Elidia Sieving, MD  oxycodone  (OXY-IR) 5 MG capsule Take 5 mg by mouth every 4 (four) hours as needed for pain.    [provider]  polyethylene glycol (MIRALAX  / GLYCOLAX ) 17 g packet Take 17 g by mouth daily as needed for mild constipation. 11/29/22   Rizwan, Saima, MD  potassium chloride  (KLOR-CON  M) 10 MEQ tablet Take 10 mEq by mouth daily. 07/06/24   [provider]  ranolazine  (RANEXA ) 500 MG 12 hr tablet Take 1 tablet (500 mg total) by mouth 2 (two) times daily. 07/14/24   Arrien, Elidia Sieving, MD  valsartan  (DIOVAN ) 320 MG tablet Take 320 mg by mouth daily. 03/29/24   [provider]    Scheduled Meds:  Continuous Infusions:  PRN Meds:   Allergies:    Allergies  Allergen Reactions   Ibuprofen  Itching   Lisinopril Other (See Comments)    Swollen lips   Pregabalin  Swelling    Recently filled 11/24 for 90ds and reports taking on 08/11/23   Gabapentin  Other (See Comments)    Dizziness    Social History:   Social History   Socioeconomic History   Marital status: Single    Spouse name: Not on file   Number of children: 3   Years of education: Not on file   Highest education level: 12th grade  Occupational History    Comment: housekeeping  Tobacco Use   Smoking status: Former   Smokeless tobacco: Former  Building Services Engineer status: Never Used  Substance and Sexual Activity   Alcohol use: No   Drug use: No   Sexual activity: Not on file  Other Topics Concern   Not on file  Social History Narrative   Lives with his children   Left handed   Caffeine: occas 1 soda mixed w water .    Social Drivers of Corporate Investment Banker Strain: Low Risk  (09/25/2023)   Received from [facility]   Overall Financial Resource Strain  (CARDIA)    Difficulty of Paying Living Expenses: Not hard at all  Food Insecurity: Food Insecurity Present (07/11/2024)   Hunger Vital Sign    Worried About Running Out of Food in the Last Year: Never true    Ran Out of Food in the Last Year: Sometimes true  Transportation Needs: Unmet Transportation Needs (07/12/2024)   PRAPARE - Administrator, Civil Service (Medical): Yes    Lack of Transportation (Non-Medical): No  Physical Activity: Unknown (06/26/2023)   Received from [facility]  Exercise Vital Sign    On average, how many days per week do you engage in moderate to strenuous exercise (like a brisk walk)?: 0 days    Minutes of Exercise per Session: Not on file  Stress: Stress Concern Present (06/26/2023)   Received from [facility] - Occupational Stress Questionnaire    Feeling of Stress : Rather much  Social Connections: Moderately Integrated (07/11/2024)   Social Connection and Isolation Panel    Frequency of Communication with Friends and Family: Three times a week    Frequency of Social Gatherings with Friends and Family: Twice a week    Attends Religious Services: 1 to 4 times per year    Active Member of [employer] or Organizations: No    Attends Banker Meetings: 1 to 4 times per year    Marital Status: Widowed  Recent Concern: Social Connections - Moderately Isolated (06/08/2024)   Social Connection and Isolation Panel    Frequency of Communication with Friends and Family: More than three times a week    Frequency of Social Gatherings with Friends and Family: More than three times a week    Attends Religious Services: More than 4 times per year    Active Member of [employer] or Organizations: No    Attends Banker Meetings: Never    Marital Status: Widowed  Intimate Partner Violence: Not At Risk (07/11/2024)   Humiliation, Afraid, Rape, and Kick questionnaire    Fear of Current or Ex-Partner:  No    Emotionally Abused: No    Physically Abused: No    Sexually Abused: No    Family History:    Family History  Problem Relation Age of Onset   Stroke Mother    Heart attack Maternal Grandfather    Heart attack Paternal Grandfather      ROS:  Please see the history of present illness.   All other ROS reviewed and negative.     Physical Exam/Data: Vitals:   07/17/24 1416 07/17/24 1837  BP: (!) 180/86 136/69  Pulse: 75 64  Resp: 18 18  Temp: (!) 100.4 F (38 C) 99.1 F (37.3 C)  TempSrc: Oral   SpO2: 94% 98%   No intake or output data in the 24 hours ending 07/17/24 1913    07/11/2024    3:55 PM 06/18/2024    4:38 AM 06/17/2024    5:00 AM  Last 3 Weights  Weight (lbs) 186 lb 1.1 oz 180 lb 12.4 oz 181 lb 14.1 oz  Weight (kg) 84.4 kg 82 kg 82.5 kg     There is no height or weight on file to calculate BMI.  General:  Well nourished, well developed, in no acute distress HEENT: normal Neck: no JVD Vascular: +2 right radial and ulcer arteries  Cardiac:  normal S1, S2; RRR; no murmur  Lungs:  clear to auscultation bilaterally, no wheezing, rhonchi or rales  Abd: soft, nontender Ext:  Soft swelling involving the right hand and the right forearm. Tenderness involving all the aspects of the the right hand all the way to the right radial access site.  Range of motion in the fingers and hand is limited due to pain. Musculoskeletal:  No deformities, BUE and BLE strength normal and equal Skin: warm and dry  Neuro:  CNs 2-12 intact, no focal abnormalities noted Psych:  Normal affect   Relevant CV Studies: LHC 06/11/2024:    Lesion #1 mid LAD lesion is  90% stenosed.   A drug-eluting stent was successfully placed using a STENT SYNERGY XD 2.50X16 deployed to 2.75 mm. Post intervention, there is a 0% residual stenosis.  TIMI-3 flow maintained.   Lesion #2 1st Diag lesion is 90% stenosed.  TIMI-3 flow   A drug-eluting stent was successfully placed using a STENT SYNERGY XD  2.25X12 => deployed to 2.4 mm.. Post intervention, there is a 0% residual stenosis.  TIMI-3 flow maintained   Lesion #3 mid Cx lesion is 99% stenosed.   Balloon angioplasty was performed using a BALLOON EMERGE MR PUSH 1.5X15. Post intervention, there is a 50% residual stenosis.  TIMI-3 flow maintained   Lesion #4 1st Mrg lesion is 99% stenosed.   Balloon angioplasty was attempted using a BALLOON EMERGE MR PUSH 1.5X8, however were not able to cross the lesion.  Post intervention, there is a 99% residual stenosis. . Was not able to advance a second wire across the lesion.   Dist Cx lesion is 100% stenosed beyond 2nd Mrg.   -----------------------------------------------------------------------------------------------   Anticipated discharge date to be determined.   Continue to titrate GDMT Monitor for symptoms, could consider reattempt at PCI of the OM using a different guide catheter and perhaps a more robust wire.   Recommend uninterrupted dual antiplatelet therapy with Aspirin  81mg  daily and Clopidogrel  75mg  daily for a minimum of 12 months (ACS-Class I recommendation).   Would continue Plavix  monotherapy after the first year but okay to interrupt.  Laboratory Data: High Sensitivity Troponin:   Recent Labs  Lab 07/10/24 2150 07/10/24 2355  TROPONINIHS 6 5     Chemistry Recent Labs  Lab 07/13/24 0304 07/14/24 0230 07/17/24 1631  NA 138 135 135  K 3.9 3.8 3.9  CL 105 104 103  CO2 23 24 20*  GLUCOSE 92 106* 102*  BUN 14 12 15   CREATININE 1.18 1.18 1.05  CALCIUM  9.0 8.6* 8.8*  MG 1.9 1.8  --   GFRNONAA >60 >60 >60  ANIONGAP 10 7 12     Recent Labs  Lab 07/10/24 2150  PROT 7.3  ALBUMIN 3.4*  AST 18  ALT 12  ALKPHOS 70  BILITOT 0.4   Lipids No results for input(s): CHOL, TRIG, HDL, LABVLDL, LDLCALC, CHOLHDL in the last 168 hours.  Hematology Recent Labs  Lab 07/13/24 0304 07/14/24 0230 07/17/24 1631  WBC 5.1 4.8 6.9  RBC 3.94* 3.43* 3.75*  HGB 11.0* 9.5*  10.5*  HCT 33.5* 29.4* 32.6*  MCV 85.0 85.7 86.9  MCH 27.9 27.7 28.0  MCHC 32.8 32.3 32.2  RDW 13.5 13.5 13.7  PLT 131* 115* 175   Thyroid  No results for input(s): TSH, FREET4 in the last 168 hours.  BNPNo results for input(s): BNP, PROBNP in the last 168 hours.  DDimer No results for input(s): DDIMER in the last 168 hours.  Radiology/Studies:  VAS US  UPPER EXTREMITY ARTERIAL DUPLEX Result Date: 07/17/2024  UPPER EXTREMITY DUPLEX STUDY Patient Name:  Luis Poole  Date of Exam:   07/17/2024 Medical Rec #: [id number]      Accession #:    [id number] Date of Birth: 07/15/1941       Patient Gender: M Patient Age:   89 years Exam Location:  [facility] Procedure:      VAS US  UPPER EXTREMITY ARTERIAL DUPLEX Referring Phys: TINNIE MATTER --------------------------------------------------------------------------------  Indications: Pain and swelling of right hand. History:     Patient has a history of upper extremity pain and catheterization  via right radial artery 05/2024.  Risk Factors:  Hypertension, Diabetes, past history of smoking, prior MI,                coronary artery disease, prior CVA. Other Factors: History of gout, CKD Comparison       Prior negative right upper extremity arterial duplex done Study:           06/13/24 Performing Technologist: Alberta Lis RVS  Examination Guidelines: A complete evaluation includes B-mode imaging, spectral Doppler, color Doppler, and power Doppler as needed of all accessible portions of each vessel. Bilateral testing is considered an integral part of a complete examination. Limited examinations for reoccurring indications may be performed as noted.  Right Doppler Findings: +-------------+----------+---------+--------+--------+ Site         PSV (cm/s)Waveform StenosisComments +-------------+----------+---------+--------+--------+                                                   +-------------+----------+---------+--------+--------+ Brachial Prox123       triphasic                 +-------------+----------+---------+--------+--------+ Brachial Dist141       triphasic                 +-------------+----------+---------+--------+--------+ Radial Prox  151       triphasic                 +-------------+----------+---------+--------+--------+ Radial Mid   113       triphasic                 +-------------+----------+---------+--------+--------+ Radial Dist  156       triphasic                 +-------------+----------+---------+--------+--------+ Ulnar Prox   57        triphasic                 +-------------+----------+---------+--------+--------+ Ulnar Mid    115       triphasic                 +-------------+----------+---------+--------+--------+ Ulnar Dist   58        triphasic                 +-------------+----------+---------+--------+--------+   Summary:  Right: No obstruction visualized in the right upper extremity.        Significant edema noted throughout forearm.  No evidence of pseudo, AVF or hematoma. *See table(s) above for measurements and observations.    Preliminary    DG Hand 2 View Right Result Date: 07/17/2024 CLINICAL DATA:  Swelling EXAM: RIGHT HAND - 2 VIEW COMPARISON:  None Available. FINDINGS: There is diffuse soft tissue swelling of the hand and wrist. There is no acute fracture or dislocation identified. There severe degenerative changes of the first metacarpophalangeal joint, radiocarpal joint, ulnar carpal joint and first carpometacarpal joint with joint space narrowing, sclerosis and osteophyte formation. IMPRESSION: 1. Diffuse soft tissue swelling of the hand and wrist. 2. 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 Electronically Signed   By: Greig Pique M.D.   On: 07/17/2024 16:29   VAS US  UPPER EXTREMITY VENOUS  DUPLEX Result Date: 07/17/2024 UPPER VENOUS STUDY  Patient Name:  Luis Poole  Date of  Exam:   07/17/2024 Medical Rec #: [id number]      Accession #:    [id number] Date of Birth: 07/15/1941       Patient Gender: M Patient Age:   64 years Exam Location:  [facility] Procedure:      VAS US  UPPER EXTREMITY VENOUS DUPLEX Referring Phys: [facility]ED[facility] --------------------------------------------------------------------------------  Indications: Swelling of hand/joint and right arm/joint pain. Risk Factors: Status post right radial cath 10/25. Limitations: Patient unable to turn palm up secondary to pain. Patient has on multiple layers of clothing and cannot remove any of them except the jacket, secondary to arm pain. Comparison Study: No prior UEV on file. Prior normal right UEA duplex done                   06/12/24 Performing Technologist: Alberta Lis RVS  Examination Guidelines: A complete evaluation includes B-mode imaging, spectral Doppler, color Doppler, and power Doppler as needed of all accessible portions of each vessel. Bilateral testing is considered an integral part of a complete examination. Limited examinations for reoccurring indications may be performed as noted.  Right Findings: +----------+------------+---------+-----------+----------+-------+ RIGHT     CompressiblePhasicitySpontaneousPropertiesSummary +----------+------------+---------+-----------+----------+-------+ IJV                      Yes       Yes                      +----------+------------+---------+-----------+----------+-------+ Subclavian               Yes       Yes                      +----------+------------+---------+-----------+----------+-------+ Axillary                 Yes       No                       +----------+------------+---------+-----------+----------+-------+ Brachial      Full                                           +----------+------------+---------+-----------+----------+-------+ Radial        Full                                          +----------+------------+---------+-----------+----------+-------+ Ulnar         Full                                          +----------+------------+---------+-----------+----------+-------+ Cephalic      Full                                          +----------+------------+---------+-----------+----------+-------+ Basilic       Full                                          +----------+------------+---------+-----------+----------+-------+  Left Findings: +----------+------------+---------+-----------+----------+-------+ LEFT      CompressiblePhasicitySpontaneousPropertiesSummary +----------+------------+---------+-----------+----------+-------+ Subclavian               Yes       Yes                      +----------+------------+---------+-----------+----------+-------+  Summary:  Right: No evidence of deep vein thrombosis in the upper extremity. No evidence of superficial vein thrombosis in the upper extremity. Subcutaneous edema noted throughout the forearm and hand. No pseudoaneurysm noted. Radial and ulnar arteries are patent with normal waveforms.  Left: No evidence of thrombosis in the subclavian.  *See table(s) above for measurements and observations.    Preliminary      Assessment and Plan: Right hand swelling Right radial artery access site swelling and pain Patient may have developed hematoma following his LHC (unclear if the degree of hematoma explains the degree of his symptoms).  He now has worsening swelling, pain, and subjective numbness involving his right hand.  On physical examination, there is soft swelling involving the right hand expending into the distal part of the right forearm (swelling is more pronounced in the right hand compared to the access site).  There is tenderness involving all the aspects of the the right  hand all the way to the right radial access site.  Range of motion in the fingers and hand is limited due to pain.  Right radial and ulnar arteries are palpable.  No objective sensory changes.  The findings of the arterial and venous with intact radial and ulnar arteries and no DVT are reassuring.  However, with the subjective numbness and significant pain limiting the range of motion, evaluation by hand surgery is warranted to rule out early compartment syndrome. - Recommend hand surgery evaluation rule out early compartment syndrome - CTA of the hand to rule out active extravasation given progressive worsening of the swelling - Pending CT results (e.g., if CT does not show significant hematoma to explain the degree of symptoms), and as symptoms seem to involve the right hand more then the forearm along with low grade fever (100.4) on presentation, need to keep other less likely causes (e.g., gout in this patient with known gout vs infection) in our mind.       For questions or updates, please contact [facility] Please consult [email or web address] for contact info under      Signed, Gillian CHRISTELLA Cass, MD  07/17/2024 7:13 PM

## 2024-07-17 NOTE — ED Notes (Signed)
 Pt remains at [place]

## 2024-07-17 NOTE — H&P (Addendum)
 History and Physical      Luis Poole FMW:[id number] DOB: 01-19-1941 DOA: 07/17/2024; DOS: 07/17/2024  PCP: Pura Lenis, MD  Patient coming from: home   I have personally briefly reviewed patient's old medical records in [facility]  Chief Complaint: Right hand swelling  HPI: Luis Poole is a 83 y.o. male with medical history significant for coronary artery disease status post PCI with drug-eluting stent placement x 2 on 06/11/2024, type 2 diabetes mellitus, chronic left foot drop, essential hypertension, hyperlipidemia, anemia of chronic disease with baseline hemoglobin 9-12, who is admitted to [facility] on 07/17/2024 with cellulitis of the right upper extremity after presenting from home to [facility] ED complaining of right hand swelling.   The patient underwent left heart cath with right radial access on 06/11/2024, at which time he underwent PCI with drug-eluting stent placement x 1 to the mid LAD as well as PCI with drug-eluting stent placement x 1 to the first diagonal.  He has subsequently been compliant with his receiving dual antiplatelet therapy.   However, he presents this evening complaining of 1 week of progressive right hand swelling, pain, increased warmth, hyperpigmentation, with proximal radiation of these symptoms past the right wrist will of the mid right forearm.  Has an associated subjective fever over the timeframe, in the absence of chills, full by rigors, or generalized myalgias.  Denies any associated acute focal weakness or any acute focal numbness/paresthesias, but reports decline in range of motion involving fingers 2 through 5 on right hand as well as diminished range of motion with right wrist flexion as a consequence of the swelling and discomfort in and wrist.  Aside from right radial access on left heart cath on 06/11/2024, the patient denies any recent trauma involving the right upper extremity.  Denies rash in any other location.   Denies any  associated new or worsening of swelling in the lower extremities, nor any recent shortness of breath or new chest pain.  Medical history is also notable for type 2 diabetes mellitus.     ED Course:  Vital signs in the ED were notable for the following: Temp max 100.4 (38.0 C), septally decreasing to 99.1 following administration of Tylenol , as below;; heart rate in the range of 60s to 70s; systolic blood pressures in the 130s 150s; respiratory rate 17-18, oxygen saturation 94 to 100% on room air.  Labs were notable for the following: BMP was notable for the following: Potassium 3.9, anion gap 12, creatinine 1.05, glucose 102.  Magnesium  level 1.9.  ESR 67, CRP 14.  CBC notable for white cell count 6900, hemoglobin 10.5 associated Neuraceq/Norocarp properties and nonelevated RDW and relative to most recent prior hemoglobin of 9.5 on 07/13/2024, platelet count 175.  COVID, influenza, RSV PCR were all negative.  Per my interpretation, EKG in ED demonstrated the following: No new EKG performed today.  Imaging in the ED, per corresponding formal radiology read, was notable for the following: Plain film of the right hand test 2 views showed diffuse soft tissue edema of the right hand and wrist, without evidence of subcutaneous gas and no evidence of acute bony abnormality, including no evidence of acute osteomyelitis.  Venous duplex of the right upper extremity showed no evidence of DVT nor any evidence of superficial vein thrombosis, but did demonstrate subcutaneous edema throughout the hand and into the forearm, while also noting patent radial and ulnar arteries, with normal waveforms noted.  Arterial duplex of the right upper extremity  showed no evidence of arterial obstruction or any evidence of hematoma.  CTA of the right upper extremity showed no evidence of acute process, including no evidence of arterial stenosis, occlusion, aneurysm, or dissection.  Given recent left heart cath via right radial  access, EDP discussed patient's case with on-call cardiology, Dr. Otelia, who has evaluated the patient, and recommended the aforementioned CTA of the right upper extremity as well as hand surgery consult.  EDP d/w on-call hand surgery, Dr. Delene, who did not feel that there is an indication for urgent surgical intervention at this time, and did not recommend MRI of the right hand. Dr. Delene recommended checking CRP, initiation of iv abx, and conveyed that hand surgery will consult.  While in the ED, the following were administered: Acetaminophen  650 mg p.o. x 1 dose, IV vancomycin .  Subsequently, the patient was admitted for further evaluation management of cellulitis of the right upper extremity.     Review of Systems: As per HPI otherwise 10 point review of systems negative.   Past Medical History:  Diagnosis Date   Anemia, unspecified 06/08/2013   Cataract    CHF (congestive heart failure) (HCC)    Chronic kidney disease    Diabetes mellitus    Foot drop, left foot    AFO   Hypertension    PAD (peripheral artery disease)    S/P lumbar fusion    Sequelae of cerebral infarction    Stroke [facility])     Past Surgical History:  Procedure Laterality Date   CATARACT EXTRACTION     CORONARY STENT INTERVENTION N/A 06/11/2024   Procedure: CORONARY STENT INTERVENTION;  Surgeon: Anner Alm ORN, MD;  Location: [facility] INVASIVE CV LAB;  Service: Cardiovascular;  Laterality: N/A;   EYE SURGERY     KNEE SURGERY  2006   LEFT HEART CATH AND CORONARY ANGIOGRAPHY N/A 06/08/2024   Procedure: LEFT HEART CATH AND CORONARY ANGIOGRAPHY;  Surgeon: Ladona Heinz, MD;  Location: [facility] INVASIVE CV LAB;  Service: Cardiovascular;  Laterality: N/A;   NECK SURGERY  2012   SPINE SURGERY  2009   TONSILECTOMY, ADENOIDECTOMY, BILATERAL MYRINGOTOMY AND TUBES      Social History:  reports that he has quit smoking. He has quit using smokeless tobacco. He reports that he does not drink alcohol and does not use  drugs.   Allergies  Allergen Reactions   Ibuprofen  Itching   Lisinopril Other (See Comments)    Swollen lips   Pregabalin  Swelling    Recently filled 11/24 for 90ds and reports taking on 08/11/23   Gabapentin  Other (See Comments)    Dizziness    Family History  Problem Relation Age of Onset   Stroke Mother    Heart attack Maternal Grandfather    Heart attack Paternal Grandfather     Family history reviewed and not pertinent    Prior to Admission medications   Medication Sig Start Date End Date Taking? Authorizing Provider  allopurinol  (ZYLOPRIM ) 100 MG tablet Take 100 mg by mouth daily as needed (for gout flareups).    [provider]  amLODipine  (NORVASC ) 10 MG tablet Take 0.5 tablets (5 mg total) by mouth daily. 08/12/23   Danford, Lonni SQUIBB, MD  aspirin  EC 81 MG tablet Take 1 tablet (81 mg total) by mouth daily. 06/12/24   Drusilla Sabas RAMAN, MD  atorvastatin  (LIPITOR ) 80 MG tablet Take 1 tablet (80 mg total) by mouth daily. 06/13/24   Drusilla Sabas RAMAN, MD  clopidogrel  (PLAVIX ) 75 MG tablet  Take 1 tablet (75 mg total) by mouth daily with breakfast. 06/13/24   Drusilla Sabas RAMAN, MD  colchicine  0.6 MG tablet Take 1 tablet (0.6 mg total) daily by mouth. Patient taking differently: Take 0.6 mg by mouth daily as needed (gout pain). 06/29/17   Dolan Mateo Larger, MD  cyclobenzaprine  (FLEXERIL ) 5 MG tablet Take 1 tablet (5 mg total) by mouth 2 (two) times daily as needed for muscle spasms. 05/25/24   Trine Raynell Moder, MD  ferrous sulfate  325 (65 FE) MG tablet Take 325 mg by mouth daily with breakfast.    [provider]  isosorbide  mononitrate (IMDUR ) 60 MG 24 hr tablet Take 1 tablet (60 mg total) by mouth daily. 07/15/24   Arrien, Elidia Sieving, MD  lidocaine  (LIDODERM ) 5 % Place 1 patch onto the skin daily. Remove & Discard patch within 12 hours or as directed by MD 03/31/24   Barrett, Jamie N, PA-C  meclizine  (ANTIVERT ) 12.5 MG tablet Take 12.5 mg by mouth 3  (three) times daily as needed for dizziness.    [provider]  nitroGLYCERIN  (NITROSTAT ) 0.4 MG SL tablet Place 1 tablet (0.4 mg total) under the tongue every 5 (five) minutes as needed for chest pain (if need more than 2 doses, please call your doctor.). 07/14/24   Arrien, Elidia Sieving, MD  oxycodone  (OXY-IR) 5 MG capsule Take 5 mg by mouth every 4 (four) hours as needed for pain.    [provider]  polyethylene glycol (MIRALAX  / GLYCOLAX ) 17 g packet Take 17 g by mouth daily as needed for mild constipation. 11/29/22   Rizwan, Saima, MD  potassium chloride  (KLOR-CON  M) 10 MEQ tablet Take 10 mEq by mouth daily. 07/06/24   [provider]  ranolazine  (RANEXA ) 500 MG 12 hr tablet Take 1 tablet (500 mg total) by mouth 2 (two) times daily. 07/14/24   Arrien, Elidia Sieving, MD  valsartan  (DIOVAN ) 320 MG tablet Take 320 mg by mouth daily. 03/29/24   [provider]     Objective    Physical Exam: Vitals:   07/17/24 1416 07/17/24 1837 07/17/24 2047  BP: (!) 180/86 136/69 (!) 151/70  Pulse: 75 64 63  Resp: 18 18 17   Temp: (!) 100.4 F (38 C) 99.1 F (37.3 C)   TempSrc: Oral    SpO2: 94% 98% 100%    General: appears to be stated age; alert, oriented Skin: warm, dry, hyperpigmentation involving the right hand, with proximal radiation into the right forearm  Head:  AT/[place] Mouth:  Oral mucosa membranes appear moist, normal dentition Neck: supple; trachea midline Heart:  RRR; did not appreciate any M/R/G Lungs: CTAB, did not appreciate any wheezes, rales, or rhonchi Abdomen: + BS; soft, ND, NT Vascular: 2+ pedal pulses b/l; 2+ radial pulses b/l Extremities: Right hand swelling, increased warmth, tenderness, hyperpigmentation, with proximal extension into the right forearm, in the absence of any evidence of crepitus.       Labs on Admission: I have personally reviewed following labs and imaging studies  CBC: Recent Labs  Lab 07/10/24 2150  07/13/24 0304 07/14/24 0230 07/17/24 1631  WBC 3.6* 5.1 4.8 6.9  NEUTROABS  --   --   --  4.3  HGB 10.8* 11.0* 9.5* 10.5*  HCT 33.9* 33.5* 29.4* 32.6*  MCV 88.5 85.0 85.7 86.9  PLT 147* 131* 115* 175   Basic Metabolic Panel: Recent Labs  Lab 07/10/24 2150 07/13/24 0304 07/14/24 0230 07/17/24 1631  NA 138 138 135 135  K 3.3* 3.9 3.8 3.9  CL 105 105 104 103  CO2 21* 23 24 20*  GLUCOSE 133* 92 106* 102*  BUN 15 14 12 15   CREATININE 1.15 1.18 1.18 1.05  CALCIUM  9.2 9.0 8.6* 8.8*  MG  --  1.9 1.8  --    GFR: Estimated Creatinine Clearance: 57.3 mL/min (by C-G formula based on SCr of 1.05 mg/dL). Liver Function Tests: Recent Labs  Lab 07/10/24 2150  AST 18  ALT 12  ALKPHOS 70  BILITOT 0.4  PROT 7.3  ALBUMIN 3.4*   No results for input(s): LIPASE, AMYLASE in the last 168 hours. No results for input(s): AMMONIA in the last 168 hours. Coagulation Profile: No results for input(s): INR, PROTIME in the last 168 hours. Cardiac Enzymes: No results for input(s): CKTOTAL, CKMB, CKMBINDEX, TROPONINI in the last 168 hours. BNP (last 3 results) No results for input(s): PROBNP in the last 8760 hours. HbA1C: No results for input(s): HGBA1C in the last 72 hours. CBG: Recent Labs  Lab 07/13/24 1126 07/13/24 1513 07/13/24 2142 07/14/24 0638 07/14/24 1216  GLUCAP 114* 117* 144* 88 109*   Lipid Profile: No results for input(s): CHOL, HDL, LDLCALC, TRIG, CHOLHDL, LDLDIRECT in the last 72 hours. Thyroid  Function Tests: No results for input(s): TSH, T4TOTAL, FREET4, T3FREE, THYROIDAB in the last 72 hours. Anemia Panel: No results for input(s): VITAMINB12, FOLATE, FERRITIN, TIBC, IRON, RETICCTPCT in the last 72 hours. Urine analysis:    Component Value Date/Time   COLORURINE YELLOW 05/11/2024 2338   APPEARANCEUR HAZY (A) 05/11/2024 2338   LABSPEC 1.010 05/11/2024 2338   PHURINE 5.5 05/11/2024 2338   GLUCOSEU NEGATIVE  05/11/2024 2338   HGBUR NEGATIVE 05/11/2024 2338   BILIRUBINUR NEGATIVE 05/11/2024 2338   KETONESUR NEGATIVE 05/11/2024 2338   PROTEINUR NEGATIVE 05/11/2024 2338   UROBILINOGEN 0.2 08/28/2012 1904   NITRITE NEGATIVE 05/11/2024 2338   LEUKOCYTESUR NEGATIVE 05/11/2024 2338    Radiological Exams on Admission: CT ANGIO UP EXTREM RIGHT W &/OR WO CONTRAST Result Date: 07/17/2024 EXAM: CTA RIGHT UPPER EXTREMITY 07/17/2024 08:31:30 PM TECHNIQUE: Contrast-enhanced computed tomography angiography of the upper extremity was performed with multiplanar reconstructions. Automated exposure control, iterative reconstruction, and/or weight based adjustment of the mA/kV was utilized to reduce the radiation dose to as low as reasonably achievable. COMPARISON: None available. CLINICAL HISTORY: AVM, hand; AVM, wrist AVM, hand; AVM, wrist FINDINGS: ARTERIAL: The axillary, brachial, radial and ulnar arteries are patent with no stenosis, occlusion, aneurysm or dissection. No abnormal arterial enhancement pattern is observed. BONES AND SOFT TISSUES: No significant osseous or soft tissue abnormalities seen within the field of view. IMPRESSION: 1. Normal CTA of the upper extremity. Electronically signed by: Norman Gatlin MD 07/17/2024 08:40 PM EST RP Workstation: [id number]   VAS US  UPPER EXTREMITY ARTERIAL DUPLEX Result Date: 07/17/2024  UPPER EXTREMITY DUPLEX STUDY Patient Name:  Luis Poole  Date of Exam:   07/17/2024 Medical Rec #: [id number]      Accession #:    [id number] Date of Birth: 03-09-1941       Patient Gender: M Patient Age:   22 years Exam Location:  [facility] Procedure:      VAS US  UPPER EXTREMITY ARTERIAL DUPLEX Referring Phys: TINNIE MATTER --------------------------------------------------------------------------------  Indications: Pain and swelling of right hand. History:     Patient has a history of upper extremity pain and catheterization              via right radial artery 05/2024.  Risk  Factors:  Hypertension, Diabetes, past history of smoking, prior MI,                coronary artery disease, prior CVA. Other Factors: History of gout, CKD Comparison       Prior negative right upper extremity arterial duplex done Study:           06/13/24 Performing Technologist: Alberta Lis RVS  Examination Guidelines: A complete evaluation includes B-mode imaging, spectral Doppler, color Doppler, and power Doppler as needed of all accessible portions of each vessel. Bilateral testing is considered an integral part of a complete examination. Limited examinations for reoccurring indications may be performed as noted.  Right Doppler Findings: +-------------+----------+---------+--------+--------+ Site         PSV (cm/s)Waveform StenosisComments +-------------+----------+---------+--------+--------+                                                  +-------------+----------+---------+--------+--------+ Brachial Prox123       triphasic                 +-------------+----------+---------+--------+--------+ Brachial Dist141       triphasic                 +-------------+----------+---------+--------+--------+ Radial Prox  151       triphasic                 +-------------+----------+---------+--------+--------+ Radial Mid   113       triphasic                 +-------------+----------+---------+--------+--------+ Radial Dist  156       triphasic                 +-------------+----------+---------+--------+--------+ Ulnar Prox   57        triphasic                 +-------------+----------+---------+--------+--------+ Ulnar Mid    115       triphasic                 +-------------+----------+---------+--------+--------+ Ulnar Dist   58        triphasic                 +-------------+----------+---------+--------+--------+   Summary:  Right: No obstruction visualized in the right upper extremity.        Significant edema noted throughout forearm.  No evidence  of pseudo, AVF or hematoma. *See table(s) above for measurements and observations.    Preliminary    DG Hand 2 View Right Result Date: 07/17/2024 CLINICAL DATA:  Swelling EXAM: RIGHT HAND - 2 VIEW COMPARISON:  None Available. FINDINGS: There is diffuse soft tissue swelling of the hand and wrist. There is no acute fracture or dislocation identified. There severe degenerative changes of the first metacarpophalangeal joint, radiocarpal joint, ulnar carpal joint and first carpometacarpal joint with joint space narrowing, sclerosis and osteophyte formation. IMPRESSION: 1. Diffuse soft tissue swelling of the hand and wrist. 2. 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 Electronically Signed   By: Greig Pique M.D.   On: 07/17/2024 16:29   VAS US  UPPER EXTREMITY VENOUS DUPLEX Result Date: 07/17/2024 UPPER VENOUS STUDY  Patient Name:  Luis Poole  Date of Exam:   07/17/2024 Medical Rec #: [id number]  Accession #:    [id number] Date of Birth: October 27, 1940       Patient Gender: M Patient Age:   61 years Exam [facility]:  [facility] Procedure:      VAS US  UPPER EXTREMITY VENOUS DUPLEX Referring Phys: [facility] MEREDITH --------------------------------------------------------------------------------  Indications: Swelling of hand/joint and right arm/joint pain. Risk Factors: Status post right radial cath 10/25. Limitations: Patient unable to turn palm up secondary to pain. Patient has on multiple layers of clothing and cannot remove any of them except the jacket, secondary to arm pain. Comparison Study: No prior UEV on file. Prior normal right UEA duplex done                   06/12/24 Performing Technologist: Alberta Lis RVS  Examination Guidelines: A complete evaluation includes B-mode imaging, spectral Doppler, color Doppler, and power Doppler as needed of all accessible portions of each vessel. Bilateral  testing is considered an integral part of a complete examination. Limited examinations for reoccurring indications may be performed as noted.  Right Findings: +----------+------------+---------+-----------+----------+-------+ RIGHT     CompressiblePhasicitySpontaneousPropertiesSummary +----------+------------+---------+-----------+----------+-------+ IJV                      Yes       Yes                      +----------+------------+---------+-----------+----------+-------+ Subclavian               Yes       Yes                      +----------+------------+---------+-----------+----------+-------+ Axillary                 Yes       No                       +----------+------------+---------+-----------+----------+-------+ Brachial      Full                                          +----------+------------+---------+-----------+----------+-------+ Radial        Full                                          +----------+------------+---------+-----------+----------+-------+ Ulnar         Full                                          +----------+------------+---------+-----------+----------+-------+ Cephalic      Full                                          +----------+------------+---------+-----------+----------+-------+ Basilic       Full                                          +----------+------------+---------+-----------+----------+-------+  Left Findings: +----------+------------+---------+-----------+----------+-------+ LEFT      CompressiblePhasicitySpontaneousPropertiesSummary +----------+------------+---------+-----------+----------+-------+ Subclavian  Yes       Yes                      +----------+------------+---------+-----------+----------+-------+  Summary:  Right: No evidence of deep vein thrombosis in the upper extremity. No evidence of superficial vein thrombosis in the upper extremity. Subcutaneous edema  noted throughout the forearm and hand. No pseudoaneurysm noted. Radial and ulnar arteries are patent with normal waveforms.  Left: No evidence of thrombosis in the subclavian.  *See table(s) above for measurements and observations.    Preliminary       Assessment/Plan   Principal Problem:   Cellulitis of right upper extremity Active Problems:   DM2 (diabetes mellitus, type 2) (HCC)   History of essential hypertension   HLD (hyperlipidemia)   Anemia of chronic disease      #) Cellulitis of the right upper extremity: In the setting of progressive right hand swelling, increased warmth, tenderness, hyperpigmentation with radiation of these features proximally into the right forearm, along with subjective fever, mildly elevated temperature in the ED, with plain film of the right hand showing diffuse soft tissue edema of the right hand and wrist, consistent with cellulitis.  This is in the setting of recent preceding left heart cath with right radial access occurring on 06/11/2024.  No evidence of subcutaneous gas on plain film nor any evidence of crepitus on exam to suggest necrotizing fasciitis.  Also, plain film of the right hand showed no evidence of acute osseous abnormality, including no evidence of acute osteomyelitis. Non-specific inflammatory markers in the form of CRP, ESR were found to be elevated, as qualified above.  Diminished range of motion involving 2 through 5 as well as diminished range of motion with flexion of the right wrist noted, as above. Differential also includes flexor tenosynovitis.   clinically, compartment syndrome appears less likely at this time, and RUE appears neurovascularly intact.  Venous ultrasound of the right upper streamed showed no evidence of DVT and radiodense a superficial vein thrombosis will so showing patent radial and ulnar arteries with normal waveform.  Arterial duplex of the right posterior medial showed no evidence of arterial obstruction nor any  evidence of hematoma, notable in the context of recent left heart cath via right radial access.  Also, CTA of the rib extremity showed no evidence of acute process, including no evidence of arterial stenosis, occlusion, aneurysm or dissection.   In the absence of temperature greater than 38.3 and in the absence of leukocytosis, SIRS criteria for sepsis are not currently met.  Patient appears hemodynamically stable.  As criteria are not currently met for sepsis, there is not currently an indication for checking of lactic acid nor is there an indication for checking of blood cultures.   EDP d/w on-call hand surgery, Dr. Delene, who did not feel that there is an indication for urgent surgical intervention at this time, and did not recommend MRI of the right hand. Dr. Delene recommended checking CRP, initiation of iv abx, and conveyed that hand surgery will consult.  The patient is noted to be on dual antiplatelet therapy in the setting of daily baby aspirin  as well as Plavix  in the context of recent placement of drug-eluting stents.  Not on formal anticoagulation as an outpatient.  Patient received IV vancomycin in the ED this evening.  Plan: Continue IV vancomycin.  Rocephin.  Repeat CRP, ESR in the morning.  Monitor on symmetry.  As needed acetaminophen  for fever.  I placed nursing  medication order requesting that over extremity be elevated to assist with gravity induced drainage of associated upper extremity edema.  prn Norco.  Hand surgery consult, as above.  Check PTT, INR.  Type and screen ordered.  Repeat CMP, CBC in the morning.                                  #) history of CAD: Status post PCI with drug-eluting stent placement x 2 on left-sided heart cath performed on 06/11/2024, with cardiology plan for continuation of uninterrupted dual antiplatelet therapy with daily baby aspirin  as well as Plavix  75 mg p.o. daily for at least 1 year followed by  monotherapy with Plavix .  Denies any chest pain at this time.  Outpatient cardiac medications include high intensity atorvastatin , valsartan .  The patient has already received today's doses of  baby aspirin  and Plavix   Plan: Resume outpatient aspirin  81 mg p.o. daily as well as Plavix  75 mg p.o. daily, with next doses of these antiplatelet medications to occur tomorrow.  Resume home atorvastatin , valsartan .  Monitor strict I's and O's and daily weights.  Continue outpatient Ranexa , Imdur .  Check admission EKG.                     #) Type 2 Diabetes Mellitus: documented history of such, which appears to be managed by lifestyle modifications in the absence of any outpatient insulin  or oral hypoglycemic agents.  Most recent hemoglobin A1c was found be 5.6% when checked on 07/10/2024.  Presenting blood sugar via BMP was noted to be 102.  Plan: accuchecks QAC and HS with low dose SSI.                           #) Essential Hypertension: documented h/o such, with outpatient antihypertensive regimen including Imdur , amlodipine , valsartan .  SBP's in the ED today: 130s to 150s mmHg. in the setting of presenting acute infection of the formal read prescribing cellulitis, will be conservative with resumption of outpatient antihypertensive medications, as indicated below.  Plan: Close monitoring of subsequent BP via routine VS. resume home valsartan  and Imdur .  Will hold next dose of home amlodipine , as above.  Monitor strict I's and O's and daily weights.                         #) Hyperlipidemia: documented h/o such. On high intensity atorvastatin  as outpatient.   Plan: continue home statin.                        #) Anemia of chronic disease: Documented history of such, a/w with baseline hgb range 9-12, with presenting hgb consistent with this range, in the absence of any overt evidence of active bleed.  Additionally, no  evidence of right upper extremity hematoma on today's arterial Doppler, notable following recent left heart cath with right radial access, as above.  He is on daily oral iron supplementation as an outpatient.   Plan: Repeat CBC in the morning.  Continue outpatient daily oral iron supplementation.  Check INR, PTT.          DVT prophylaxis: SCD's   Code Status: Full code Family Communication: none Disposition Plan: Per Rounding Team Consults called:  EDP d/w on-call hand surgery, Dr. Delene, who did not feel that there is an indication for  urgent surgical intervention at this time, and did not recommend MRI of the right hand. Dr. Delene recommended checking CRP, initiation of iv abx, and conveyed that hand surgery will consult.;  Admission status: inpatient     I SPENT GREATER THAN 75  MINUTES IN CLINICAL CARE TIME/MEDICAL DECISION-MAKING IN COMPLETING THIS ADMISSION.      Eva NOVAK Jaquin Coy DO [facility]  From 7PM - 7AM   07/17/2024, 9:24 PM

## 2024-07-17 NOTE — Progress Notes (Signed)
 VASCULAR LAB    Right upper extremity venous duplex has been performed.  See CV proc for preliminary results.  Relayed results to Dr. Ellouise via secure chat  RACHEL PELLET, RVT 07/17/2024, 3:27 PM

## 2024-07-18 DIAGNOSIS — M7989 Other specified soft tissue disorders: Secondary | ICD-10-CM | POA: Diagnosis not present

## 2024-07-18 DIAGNOSIS — L03113 Cellulitis of right upper limb: Secondary | ICD-10-CM | POA: Diagnosis not present

## 2024-07-18 LAB — CBC WITH DIFFERENTIAL/PLATELET
Abs Immature Granulocytes: 0.01 K/uL (ref 0.00–0.07)
Basophils Absolute: 0 K/uL (ref 0.0–0.1)
Basophils Relative: 0 %
Eosinophils Absolute: 0.1 K/uL (ref 0.0–0.5)
Eosinophils Relative: 3 %
HCT: 29.9 % — ABNORMAL LOW (ref 39.0–52.0)
Hemoglobin: 9.8 g/dL — ABNORMAL LOW (ref 13.0–17.0)
Immature Granulocytes: 0 %
Lymphocytes Relative: 38 %
Lymphs Abs: 1.9 K/uL (ref 0.7–4.0)
MCH: 27.9 pg (ref 26.0–34.0)
MCHC: 32.8 g/dL (ref 30.0–36.0)
MCV: 85.2 fL (ref 80.0–100.0)
Monocytes Absolute: 0.6 K/uL (ref 0.1–1.0)
Monocytes Relative: 11 %
Neutro Abs: 2.4 K/uL (ref 1.7–7.7)
Neutrophils Relative %: 48 %
Platelets: 170 K/uL (ref 150–400)
RBC: 3.51 MIL/uL — ABNORMAL LOW (ref 4.22–5.81)
RDW: 13.6 % (ref 11.5–15.5)
WBC: 5 K/uL (ref 4.0–10.5)
nRBC: 0 % (ref 0.0–0.2)

## 2024-07-18 LAB — APTT: aPTT: 42 s — ABNORMAL HIGH (ref 24–36)

## 2024-07-18 LAB — COMPREHENSIVE METABOLIC PANEL WITH GFR
ALT: 9 U/L (ref 0–44)
AST: 14 U/L — ABNORMAL LOW (ref 15–41)
Albumin: 2.8 g/dL — ABNORMAL LOW (ref 3.5–5.0)
Alkaline Phosphatase: 47 U/L (ref 38–126)
Anion gap: 9 (ref 5–15)
BUN: 16 mg/dL (ref 8–23)
CO2: 22 mmol/L (ref 22–32)
Calcium: 8.6 mg/dL — ABNORMAL LOW (ref 8.9–10.3)
Chloride: 103 mmol/L (ref 98–111)
Creatinine, Ser: 1.22 mg/dL (ref 0.61–1.24)
GFR, Estimated: 60 mL/min — ABNORMAL LOW (ref >60)
Glucose, Bld: 119 mg/dL — ABNORMAL HIGH (ref 70–99)
Potassium: 3.6 mmol/L (ref 3.5–5.1)
Sodium: 134 mmol/L — ABNORMAL LOW (ref 135–145)
Total Bilirubin: 1 mg/dL (ref 0.0–1.2)
Total Protein: 6.6 g/dL (ref 6.5–8.1)

## 2024-07-18 LAB — BASIC METABOLIC PANEL WITH GFR
Anion gap: 9 (ref 5–15)
BUN: 14 mg/dL (ref 8–23)
CO2: 20 mmol/L — ABNORMAL LOW (ref 22–32)
Calcium: 8.3 mg/dL — ABNORMAL LOW (ref 8.9–10.3)
Chloride: 105 mmol/L (ref 98–111)
Creatinine, Ser: 1.17 mg/dL (ref 0.61–1.24)
GFR, Estimated: >60 mL/min (ref >60)
Glucose, Bld: 166 mg/dL — ABNORMAL HIGH (ref 70–99)
Potassium: 3.4 mmol/L — ABNORMAL LOW (ref 3.5–5.1)
Sodium: 134 mmol/L — ABNORMAL LOW (ref 135–145)

## 2024-07-18 LAB — TYPE AND SCREEN
ABO/RH(D): O POS
Antibody Screen: NEGATIVE

## 2024-07-18 LAB — C-REACTIVE PROTEIN: CRP: 14.1 mg/dL — ABNORMAL HIGH (ref <1.0)

## 2024-07-18 LAB — GLUCOSE, CAPILLARY
Glucose-Capillary: 94 mg/dL (ref 70–99)
Glucose-Capillary: 116 mg/dL — ABNORMAL HIGH (ref 70–99)
Glucose-Capillary: 81 mg/dL (ref 70–99)
Glucose-Capillary: 139 mg/dL — ABNORMAL HIGH (ref 70–99)

## 2024-07-18 LAB — MAGNESIUM: Magnesium: 1.9 mg/dL (ref 1.7–2.4)

## 2024-07-18 LAB — PROTIME-INR
INR: 1.2 (ref 0.8–1.2)
Prothrombin Time: 15.9 s — ABNORMAL HIGH (ref 11.4–15.2)

## 2024-07-18 LAB — SEDIMENTATION RATE: Sed Rate: 75 mm/h — ABNORMAL HIGH (ref 0–16)

## 2024-07-18 MED ORDER — ALLOPURINOL 100 MG PO TABS
100.0000 mg | ORAL_TABLET | Freq: Every day | ORAL | Status: DC
  Administered 2024-07-18 – 2024-07-20 (×3): 100 mg via ORAL
  Filled 2024-07-18 (×3): qty 1

## 2024-07-18 MED ORDER — ENOXAPARIN SODIUM 40 MG/0.4ML IJ SOSY
40.0000 mg | PREFILLED_SYRINGE | INTRAMUSCULAR | Status: DC
  Administered 2024-07-18 – 2024-07-20 (×3): 40 mg via SUBCUTANEOUS
  Filled 2024-07-18 (×3): qty 0.4

## 2024-07-18 MED ORDER — VANCOMYCIN HCL 1250 MG/250ML IV SOLN
1250.0000 mg | INTRAVENOUS | Status: DC
  Administered 2024-07-18 – 2024-07-20 (×3): 1250 mg via INTRAVENOUS
  Filled 2024-07-18 (×3): qty 250

## 2024-07-18 MED ORDER — PANTOPRAZOLE SODIUM 40 MG PO TBEC
40.0000 mg | DELAYED_RELEASE_TABLET | Freq: Every day | ORAL | Status: DC
  Administered 2024-07-18 – 2024-07-20 (×3): 40 mg via ORAL
  Filled 2024-07-18 (×3): qty 1

## 2024-07-18 NOTE — Progress Notes (Signed)
 Orthopaedic Surgery Hand and Upper Extremity History and Physical Examination 07/18/2024   CC: Right hand/wrist swelling  HPI: Luis Poole is a 83 y.o. male with multiple medical comorbidities who presented to the emergency department last night due to right upper extremity pain and swelling.  Approximately 1 month ago he had a left heart catheterization via the right radial artery.  It is unclear if he has had pain since then or not but he does report that the pain worsened 3 days ago.  Patient endorses injuring his right hand while home for Thanksgiving when he was trying to put the brake on his wheelchair and his hand slipped.  Initially cardiology was consulted and they recommended a ultrasound to check for blood clot, bleeding or hematoma potentially causing compartment syndrome although this was negative.  I recommended a CT angio of the right upper extremity which was also negative for bleeding, blood clot, pseudoaneurysm, etc.  Had a temperature of 100.4 upon presentation to the ED.   Past Medical History: Past Medical History:  Diagnosis Date   Anemia, unspecified 06/08/2013   CAD (coronary artery disease)    pci with DES x 2 on 06/11/24   Cataract    CHF (congestive heart failure) (HCC)    Chronic kidney disease    Diabetes mellitus    Foot drop, left foot    AFO   Hypertension    PAD (peripheral artery disease)    S/P lumbar fusion    Sequelae of cerebral infarction    Stroke [facility])      Medications: Scheduled Meds:  allopurinol   100 mg Oral Daily   aspirin  EC  81 mg Oral Daily   atorvastatin   80 mg Oral QPC supper   clopidogrel   75 mg Oral Q breakfast   enoxaparin  (LOVENOX ) injection  40 mg Subcutaneous Q24H   ferrous sulfate   325 mg Oral Q breakfast   insulin  aspart  0-5 Units Subcutaneous QHS   insulin  aspart  0-9 Units Subcutaneous TID WC   irbesartan   150 mg Oral Daily   isosorbide  mononitrate  60 mg Oral Daily   pantoprazole   40 mg Oral Daily    ranolazine   500 mg Oral BID   Continuous Infusions:  cefTRIAXone (ROCEPHIN)  IV Stopped (07/17/24 2239)   vancomycin 1,250 mg (07/18/24 1221)   PRN Meds:.acetaminophen  **OR** acetaminophen , HYDROcodone -acetaminophen , melatonin, ondansetron  (ZOFRAN ) IV  Allergies: Allergies as of 07/17/2024 - Review Complete 07/17/2024  Allergen Reaction Noted   Ibuprofen  Itching 11/05/2019   Lisinopril Swelling and Other (See Comments) 02/01/2016   Pregabalin  Swelling 01/02/2021   Gabapentin  Other (See Comments) 01/16/2017    Past Surgical History: Past Surgical History:  Procedure Laterality Date   CATARACT EXTRACTION     CORONARY STENT INTERVENTION N/A 06/11/2024   Procedure: CORONARY STENT INTERVENTION;  Surgeon: Anner Alm ORN, MD;  Location: [facility] INVASIVE CV LAB;  Service: Cardiovascular;  Laterality: N/A;   EYE SURGERY     KNEE SURGERY  2006   LEFT HEART CATH AND CORONARY ANGIOGRAPHY N/A 06/08/2024   Procedure: LEFT HEART CATH AND CORONARY ANGIOGRAPHY;  Surgeon: Ladona Heinz, MD;  Location: [facility] INVASIVE CV LAB;  Service: Cardiovascular;  Laterality: N/A;   NECK SURGERY  2012   SPINE SURGERY  2009   TONSILECTOMY, ADENOIDECTOMY, BILATERAL MYRINGOTOMY AND TUBES       Social History: Social History   Occupational History    Comment: housekeeping  Tobacco Use   Smoking status: Former   Smokeless tobacco: Former  Vaping Use   Vaping status: Never Used  Substance and Sexual Activity   Alcohol use: No   Drug use: No   Sexual activity: Not on file     Family History: Family History  Problem Relation Age of Onset   Stroke Mother    Heart attack Maternal Grandfather    Heart attack Paternal Grandfather    Otherwise, no relevant orthopaedic family history  ROS: Review of Systems: All systems reviewed and are negative except that mentioned in HPI  Work/Sport/Hobbies: See HPI  Physical Examination: Vitals:   07/18/24 0444 07/18/24 0745  BP: (!) 162/69 120/70  Pulse: 75 71   Resp: 16 17  Temp: 98.5 F (36.9 C) 98.5 F (36.9 C)  SpO2: 100% 99%   Constitutional: Awake, alert.  WN/WD Appearance: healthy, no acute distress, well-groomed Affect: Normal HEENT: EOMI, mucous membranes moist CV: RRR Pulm: breathing comfortably   Right upper Extremity / Hand Patient was awakened as he was sleeping comfortably in the room when I arrived.  Tender pitting edema of the right hand, wrist and distal forearm.  Slight erythema present.  No significant warmth compared to contralateral side.  Able to actively wiggle fingers and flex and extend wrist with some pain.  Mild pain with passive flexion extension of the fingers.  Limited range of motion of the wrist and fingers.  Limited supination of the forearm.   Pertinent Labs: Elevated ESR and CRP.  Normal white count.  Imaging: I have personally reviewed the following studies: X-ray right hand: No fractures, dislocations, or acute osseous abnormalities.  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 CT angio right upper extremity: No obvious arterial issues or evidence of abscess visible on review of CT. Ultrasound reportedly demonstrating soft tissue swelling  Additional Studies: n/a  Assessment/Plan: Luis Poole is a 83 y.o. male with right upper extremity pain and swelling consistent with cellulitis.   Recommend broad-spectrum antibiotics for cellulitis.  Upper extremity elevation and icing as needed.  Could consider MRI although this is likely not to be high yield given that there did not seem to be any abscess visible on CT.  Limited supination of the forearm again likely due to severe degenerative changes of the wrist and specifically the DRUJ seen on imaging.  No concern for septic arthritis.  the symptoms are significantly diffuse and there is no effusion of the wrist present.  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    No concern for compartment syndrome.  Pitting edema has not suggestive of compartment syndrome.  No active bleeding on imaging.  Pain is reasonably well-controlled that he is able to sleep.  Some pain with passive range of motion of the fingers expected in hand and forearm cellulitis and his pain is not very severe.    Marsa Christen, MD Hand and Upper Extremity Surgery [facility] [place] [phone] 07/18/2024 2:16 PM

## 2024-07-18 NOTE — Progress Notes (Signed)
   Chart reviewed, including overnight consult note regarding RUE swelling. Noted to have recent LHC and PCI with right radial artery access (about 1 month ago). Post-procedure dopplers and repeat dopplers reviewed, which show no pseudoaneurysm or hematoma - only soft tissue swelling, but he has had right wrist tenderness and swelling. CT of the RUE was read as normal. Ortho hand surgery consulted.  From a cardiac standpoint, there is little else to offer. If there is indication for surgical repair, would have to consider IV antiplatelet therapy given recent stent as it would be risky to stop antiplatelets so soon after PCI for a procedure.  Cardiology will be available as needed. Signoff for now.  Luis KYM Maxcy, MD, [facility], FNLA, FACP  [place]  [facility]  Medical Director of the [facility] Lipid Disorders &  Cardiovascular Risk Reduction Clinic Diplomate of the American Board of Clinical Lipidology Attending Cardiologist  Direct Dial: [phone]  Fax: [phone]  Website:  [email or web address]

## 2024-07-18 NOTE — Progress Notes (Signed)
 PROGRESS NOTE    [name]  FMW:[id number] DOB: 24-Sep-1940 DOA: 07/17/2024 PCP: Pura Lenis, MD  No chief complaint on file.   Brief Narrative:   Luis Poole is Luis Poole 83 y.o. male with medical history significant for coronary artery disease status post PCI with drug-eluting stent placement x 2 on 06/11/2024, type 2 diabetes mellitus, chronic left foot drop, essential hypertension, hyperlipidemia, anemia of chronic disease with baseline hemoglobin 9-12, who is admitted to [facility] on 07/17/2024 with cellulitis of the right upper extremity after presenting from home to [facility] ED complaining of right hand swelling.   Assessment & Plan:   Principal Problem:   Cellulitis of right upper extremity Active Problems:   DM2 (diabetes mellitus, type 2) (HCC)   History of essential hypertension   HLD (hyperlipidemia)   Anemia of chronic disease  Right Upper Extremity Cellulitis Right Hand Infection Fever at presentation.  Sepsis ruled out.  No tachycardia, tachypnea, or white count. ? tenosynovitis Due to recent LHC (06/12/2023) with right radial access, cardiology was consulted - CTA RUE normal.  RUE arterial duplex without obstruction, no pseuod/AVF/hematoma.  (Note he had arterial US  on 10/26 as well due to wrist and joint pain after the cath, US  was negative for pseudoaneurysm). Negative RUE US  for DVT. Cardiology recommended hand surgery c/s, CTA as noted above Dr. Delene called by EDP, recommended hospitalist admission for cellulitis.  I've repaged him for consult today.  Elevated CRP, sed rate  Hx CAD, s/p LHC with DES 05/2023 Continue DAPT, statin ranexa   T2DM SSI A1c 5.6 06/2024  Hypertension Amlodipine  currently on hold Continue valsartan  (pharmacy substitute), continue imdur   Anemia Trend  Dyslipidemia Statin  GERD PPI  Gout allopurinol     DVT prophylaxis: lovenox  Code Status: full Family Communication: none Disposition:   Status is:  Inpatient Remains inpatient appropriate because: need for continued orthopedic care   Consultants:  Hand surgery cardiology  Procedures:  none  Antimicrobials:  Anti-infectives (From admission, onward)    Start     Dose/Rate Route Frequency Ordered Stop   07/18/24 1130  vancomycin (VANCOREADY) IVPB 1250 mg/250 mL        1,250 mg 166.7 mL/hr over 90 Minutes Intravenous Every 24 hours 07/18/24 1035     07/18/24 1100  vancomycin (VANCOREADY) IVPB 750 mg/150 mL  Status:  Discontinued        750 mg 150 mL/hr over 60 Minutes Intravenous Every 12 hours 07/17/24 2129 07/18/24 1035   07/17/24 2130  cefTRIAXone (ROCEPHIN) 1 g in sodium chloride  0.9 % 100 mL IVPB        1 g 200 mL/hr over 30 Minutes Intravenous Every 24 hours 07/17/24 2124     07/17/24 2130  vancomycin (VANCOREADY) IVPB 500 mg/100 mL        500 mg 100 mL/hr over 60 Minutes Intravenous  Once 07/17/24 2129 07/18/24 0021   07/17/24 2100  vancomycin (VANCOCIN) IVPB 1000 mg/200 mL premix        1,000 mg 200 mL/hr over 60 Minutes Intravenous  Once 07/17/24 2050 07/17/24 2203       Subjective: Feeling ok, continued R hand pain  Objective: Vitals:   07/17/24 2047 07/17/24 2300 07/18/24 0444 07/18/24 0745  BP: (!) 151/70 137/64 (!) 162/69 120/70  Pulse: 63 63 75 71  Resp: 17 20 16 17   Temp:  98.1 F (36.7 C) 98.5 F (36.9 C) 98.5 F (36.9 C)  TempSrc:  Oral Oral Oral  SpO2: 100% 100%  100% 99%  Weight:  78.9 kg 78.6 kg   Height:  5' 7 (1.702 m)      Intake/Output Summary (Last 24 hours) at 07/18/2024 1107 Last data filed at 07/17/2024 2300 Gross per 24 hour  Intake 396.94 ml  Output --  Net 396.94 ml   Filed Weights   07/17/24 2300 07/18/24 0444  Weight: 78.9 kg 78.6 kg    Examination:  General exam: Appears calm and comfortable  Respiratory system: unlabored Cardiovascular system: rrr Central nervous system: Alert and oriented. No focal neurological deficits. Extremities: RUE swelling, pain with  passive extension most notably of 5th finger, pain with manipulation of thumb.  Mild TTP throughout hand.  Resting in mild flexion.   Data Reviewed: I have personally reviewed following labs and imaging studies  CBC: Recent Labs  Lab 07/13/24 0304 07/14/24 0230 07/17/24 1631 07/17/24 2354  WBC 5.1 4.8 6.9 5.0  NEUTROABS  --   --  4.3 2.4  HGB 11.0* 9.5* 10.5* 9.8*  HCT 33.5* 29.4* 32.6* 29.9*  MCV 85.0 85.7 86.9 85.2  PLT 131* 115* 175 170    Basic Metabolic Panel: Recent Labs  Lab 07/13/24 0304 07/14/24 0230 07/17/24 1631 07/17/24 2354  NA 138 135 135 134*  K 3.9 3.8 3.9 3.6  CL 105 104 103 103  CO2 23 24 20* 22  GLUCOSE 92 106* 102* 119*  BUN 14 12 15 16   CREATININE 1.18 1.18 1.05 1.22  CALCIUM  9.0 8.6* 8.8* 8.6*  MG 1.9 1.8 1.9 1.9    GFR: Estimated Creatinine Clearance: 44.4 mL/min (by C-G formula based on SCr of 1.22 mg/dL).  Liver Function Tests: Recent Labs  Lab 07/17/24 2354  AST 14*  ALT 9  ALKPHOS 47  BILITOT 1.0  PROT 6.6  ALBUMIN 2.8*    CBG: Recent Labs  Lab 07/14/24 0638 07/14/24 1216 07/17/24 2237 07/17/24 2253 07/18/24 0746  GLUCAP 88 109* 175* 155* 94     Recent Results (from the past 240 hours)  Resp panel by RT-PCR (RSV, Flu Quadry Kampa&B, Covid) Anterior Nasal Swab     Status: None   Collection Time: 07/17/24  2:35 PM   Specimen: Anterior Nasal Swab  Result Value Ref Range Status   SARS Coronavirus 2 by RT PCR NEGATIVE NEGATIVE Final   Influenza Yee Joss by PCR NEGATIVE NEGATIVE Final   Influenza B by PCR NEGATIVE NEGATIVE Final    Comment: (NOTE) The Xpert Xpress SARS-CoV-2/FLU/RSV plus assay is intended as an aid in the diagnosis of influenza from Nasopharyngeal swab specimens and should not be used as Huxley Shurley sole basis for treatment. Nasal washings and aspirates are unacceptable for Xpert Xpress SARS-CoV-2/FLU/RSV testing.  Fact Sheet for Patients: bloggercourse.com  Fact Sheet for Healthcare  Providers: seriousbroker.it  This test is not yet approved or cleared by the [place]  FDA and has been authorized for detection and/or diagnosis of SARS-CoV-2 by FDA under an Emergency Use Authorization (EUA). This EUA will remain in effect (meaning this test can be used) for the duration of the COVID-19 declaration under Section 564(b)(1) of the Act, 21 U.S.C. section 360bbb-3(b)(1), unless the authorization is terminated or revoked.     Resp Syncytial Virus by PCR NEGATIVE NEGATIVE Final    Comment: (NOTE) Fact Sheet for Patients: bloggercourse.com  Fact Sheet for Healthcare Providers: seriousbroker.it  This test is not yet approved or cleared by the [place]  FDA and has been authorized for detection and/or diagnosis of SARS-CoV-2 by FDA under an Emergency Use Authorization (  EUA). This EUA will remain in effect (meaning this test can be used) for the duration of the COVID-19 declaration under Section 564(b)(1) of the Act, 21 U.S.C. section 360bbb-3(b)(1), unless the authorization is terminated or revoked.  Performed at [facility], [address], [place], [place] [address]          Radiology Studies: VAS US  UPPER EXTREMITY ARTERIAL DUPLEX Result Date: 07/18/2024  UPPER EXTREMITY DUPLEX STUDY Patient Name:  THELMA VIANA  Date of Exam:   07/17/2024 Medical Rec #: [id number]      Accession #:    [id number] Date of Birth: April 21, 1941       Patient Gender: M Patient Age:   16 years Exam Location:  [facility] Procedure:      VAS US  UPPER EXTREMITY ARTERIAL DUPLEX Referring Phys: TINNIE MATTER --------------------------------------------------------------------------------  Indications: Pain and swelling of right hand. History:     Patient has Uzoma Vivona history of upper extremity pain and catheterization              via right radial artery 05/2024.  Risk Factors:  Hypertension, Diabetes, past  history of smoking, prior MI,                coronary artery disease, prior CVA. Other Factors: History of gout, CKD Comparison       Prior negative right upper extremity arterial duplex done Study:           06/13/24 Performing Technologist: Alberta Lis RVS  Examination Guidelines: Recia Sons complete evaluation includes B-mode imaging, spectral Doppler, color Doppler, and power Doppler as needed of all accessible portions of each vessel. Bilateral testing is considered an integral part of Rivkah Wolz complete examination. Limited examinations for reoccurring indications may be performed as noted.  Right Doppler Findings: +-------------+----------+---------+--------+--------+ Site         PSV (cm/s)Waveform StenosisComments +-------------+----------+---------+--------+--------+                                                  +-------------+----------+---------+--------+--------+ Brachial Prox123       triphasic                 +-------------+----------+---------+--------+--------+ Brachial Dist141       triphasic                 +-------------+----------+---------+--------+--------+ Radial Prox  151       triphasic                 +-------------+----------+---------+--------+--------+ Radial Mid   113       triphasic                 +-------------+----------+---------+--------+--------+ Radial Dist  156       triphasic                 +-------------+----------+---------+--------+--------+ Ulnar Prox   57        triphasic                 +-------------+----------+---------+--------+--------+ Ulnar Mid    115       triphasic                 +-------------+----------+---------+--------+--------+ Ulnar Dist   58        triphasic                 +-------------+----------+---------+--------+--------+  Summary:  Right: No obstruction visualized in the right upper extremity.        Significant edema noted throughout forearm.  No evidence of pseudo, AVF or hematoma. *See  table(s) above for measurements and observations. Electronically signed by Norman Serve on 07/18/2024 at 9:01:00 AM.    Final    VAS US  UPPER EXTREMITY VENOUS DUPLEX Result Date: 07/18/2024 UPPER VENOUS STUDY  Patient Name:  AGUSTUS MANE  Date of Exam:   07/17/2024 Medical Rec #: [id number]      Accession #:    [id number] Date of Birth: [birth year]       Patient Gender: M Patient Age:   23 years Exam Location:  [facility] Procedure:      VAS US  UPPER EXTREMITY VENOUS DUPLEX Referring Phys: [facility] MEREDITH --------------------------------------------------------------------------------  Indications: Swelling of hand/joint and right arm/joint pain. Risk Factors: Status post right radial cath 10/25. Limitations: Patient unable to turn palm up secondary to pain. Patient has on multiple layers of clothing and cannot remove any of them except the jacket, secondary to arm pain. Comparison Study: No prior UEV on file. Prior normal right UEA duplex done                   06/12/24 Performing Technologist: Alberta Lis RVS  Examination Guidelines: Mariellen Blaney complete evaluation includes B-mode imaging, spectral Doppler, color Doppler, and power Doppler as needed of all accessible portions of each vessel. Bilateral testing is considered an integral part of Tawnie Ehresman complete examination. Limited examinations for reoccurring indications may be performed as noted.  Right Findings: +----------+------------+---------+-----------+----------+-------+ RIGHT     CompressiblePhasicitySpontaneousPropertiesSummary +----------+------------+---------+-----------+----------+-------+ IJV                      Yes       Yes                      +----------+------------+---------+-----------+----------+-------+ Subclavian               Yes       Yes                      +----------+------------+---------+-----------+----------+-------+ Axillary                 Yes       No                        +----------+------------+---------+-----------+----------+-------+ Brachial      Full                                          +----------+------------+---------+-----------+----------+-------+ Radial        Full                                          +----------+------------+---------+-----------+----------+-------+ Ulnar         Full                                          +----------+------------+---------+-----------+----------+-------+ Cephalic      Full                                          +----------+------------+---------+-----------+----------+-------+  Basilic       Full                                          +----------+------------+---------+-----------+----------+-------+  Left Findings: +----------+------------+---------+-----------+----------+-------+ LEFT      CompressiblePhasicitySpontaneousPropertiesSummary +----------+------------+---------+-----------+----------+-------+ Subclavian               Yes       Yes                      +----------+------------+---------+-----------+----------+-------+  Summary:  Right: No evidence of deep vein thrombosis in the upper extremity. No evidence of superficial vein thrombosis in the upper extremity. Subcutaneous edema noted throughout the forearm and hand. No pseudoaneurysm noted. Radial and ulnar arteries are patent with normal waveforms.  Left: No evidence of thrombosis in the subclavian.  *See table(s) above for measurements and observations.  Diagnosing physician: Norman Serve Electronically signed by Norman Serve on 07/18/2024 at 9:00:39 AM.    Final    CT ANGIO UP EXTREM RIGHT W &/OR WO CONTRAST Result Date: 07/17/2024 EXAM: CTA RIGHT UPPER EXTREMITY 07/17/2024 08:31:30 PM TECHNIQUE: Contrast-enhanced computed tomography angiography of the upper extremity was performed with multiplanar reconstructions. Automated exposure control, iterative reconstruction, and/or weight based adjustment of  the mA/kV was utilized to reduce the radiation dose to as low as reasonably achievable. COMPARISON: None available. CLINICAL HISTORY: AVM, hand; AVM, wrist AVM, hand; AVM, wrist FINDINGS: ARTERIAL: The axillary, brachial, radial and ulnar arteries are patent with no stenosis, occlusion, aneurysm or dissection. No abnormal arterial enhancement pattern is observed. BONES AND SOFT TISSUES: No significant osseous or soft tissue abnormalities seen within the field of view. IMPRESSION: 1. Normal CTA of the upper extremity. Electronically signed by: Norman Gatlin MD 07/17/2024 08:40 PM EST RP Workstation: [id number]   DG Hand 2 View Right Result Date: 07/17/2024 CLINICAL DATA:  Swelling EXAM: RIGHT HAND - 2 VIEW COMPARISON:  None Available. FINDINGS: There is diffuse soft tissue swelling of the hand and wrist. There is no acute fracture or dislocation identified. There severe degenerative changes of the first metacarpophalangeal joint, radiocarpal joint, ulnar carpal joint and first carpometacarpal joint with joint space narrowing, sclerosis and osteophyte formation. IMPRESSION: 1. Diffuse soft tissue swelling of the hand and wrist. 2. 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 Electronically Signed   By: Greig Pique M.D.   On: 07/17/2024 16:29        Scheduled Meds:  aspirin  EC  81 mg Oral Daily   atorvastatin   80 mg Oral QPC supper   clopidogrel   75 mg Oral Q breakfast   ferrous sulfate   325 mg Oral Q breakfast   insulin  aspart  0-5 Units Subcutaneous QHS   insulin  aspart  0-9 Units Subcutaneous TID WC   irbesartan   150 mg Oral Daily   isosorbide  mononitrate  60 mg Oral Daily   ranolazine   500 mg Oral BID   Continuous Infusions:  cefTRIAXone (ROCEPHIN)  IV Stopped (07/17/24 2239)   vancomycin       LOS: 1 day    Time spent: over 30 min     Meliton Monte, MD [facility]   To  contact the attending provider between 7A-7P or the covering provider during after hours 7P-7A, please log into the web site [email or web address] and access using universal [place]  password for that web site. If you do not have the password, please call the hospital operator.  07/18/2024, 11:07 AM

## 2024-07-18 NOTE — Plan of Care (Signed)

## 2024-07-18 NOTE — Progress Notes (Signed)
 Pharmacy Antibiotic Note  Luis Poole is a 83 y.o. male admitted on 07/17/2024 presenting with cellulitis.  Pharmacy has been consulted for vancomycin dosing.  Vancomycin 1g IV x 1 given in ED  Plan: Scr increased to 1.22 from 1.05 Vancomycin 1250 q24 (Scr 1.22, Vd 0.72, eAUC 535) Monitor renal function, cultures, and clinical progression to narrow Vancomycin levels to be drawn when deemed appropriate  Height: 5' 7 (170.2 cm) Weight: 78.6 kg (173 lb 4.5 oz) IBW/kg (Calculated) : 66.1  Temp (24hrs), Avg:98.9 F (37.2 C), Min:98.1 F (36.7 C), Max:100.4 F (38 C)  Recent Labs  Lab 07/13/24 0304 07/14/24 0230 07/17/24 1631 07/17/24 2354  WBC 5.1 4.8 6.9 5.0  CREATININE 1.18 1.18 1.05 1.22    Estimated Creatinine Clearance: 44.4 mL/min (by C-G formula based on SCr of 1.22 mg/dL).    Allergies  Allergen Reactions   Ibuprofen  Itching   Lisinopril Swelling and Other (See Comments)    Swollen lips   Pregabalin  Swelling   Gabapentin  Other (See Comments)    Dizziness    R. Samual Satterfield, PharmD PGY-1 Acute Care Pharmacy Resident [facility] Please refer to [facility] for [facility] Pharmacy numbers 07/18/2024 10:33 AM

## 2024-07-19 DIAGNOSIS — L03113 Cellulitis of right upper limb: Secondary | ICD-10-CM | POA: Diagnosis not present

## 2024-07-19 LAB — BASIC METABOLIC PANEL WITH GFR
Anion gap: 7 (ref 5–15)
Anion gap: 8 (ref 5–15)
BUN: 16 mg/dL (ref 8–23)
BUN: 15 mg/dL (ref 8–23)
CO2: 23 mmol/L (ref 22–32)
CO2: 23 mmol/L (ref 22–32)
Calcium: 8.1 mg/dL — ABNORMAL LOW (ref 8.9–10.3)
Calcium: 8.2 mg/dL — ABNORMAL LOW (ref 8.9–10.3)
Chloride: 105 mmol/L (ref 98–111)
Chloride: 105 mmol/L (ref 98–111)
Creatinine, Ser: 1.35 mg/dL — ABNORMAL HIGH (ref 0.61–1.24)
Creatinine, Ser: 1.34 mg/dL — ABNORMAL HIGH (ref 0.61–1.24)
GFR, Estimated: 53 mL/min — ABNORMAL LOW (ref >60)
GFR, Estimated: 53 mL/min — ABNORMAL LOW (ref >60)
Glucose, Bld: 122 mg/dL — ABNORMAL HIGH (ref 70–99)
Glucose, Bld: 114 mg/dL — ABNORMAL HIGH (ref 70–99)
Potassium: 3.5 mmol/L (ref 3.5–5.1)
Potassium: 3.6 mmol/L (ref 3.5–5.1)
Sodium: 135 mmol/L (ref 135–145)
Sodium: 136 mmol/L (ref 135–145)

## 2024-07-19 LAB — GLUCOSE, CAPILLARY
Glucose-Capillary: 103 mg/dL — ABNORMAL HIGH (ref 70–99)
Glucose-Capillary: 110 mg/dL — ABNORMAL HIGH (ref 70–99)
Glucose-Capillary: 116 mg/dL — ABNORMAL HIGH (ref 70–99)
Glucose-Capillary: 144 mg/dL — ABNORMAL HIGH (ref 70–99)
Glucose-Capillary: 94 mg/dL (ref 70–99)

## 2024-07-19 LAB — MAGNESIUM: Magnesium: 1.9 mg/dL (ref 1.7–2.4)

## 2024-07-19 LAB — CBC
HCT: 27.2 % — ABNORMAL LOW (ref 39.0–52.0)
HCT: 28.1 % — ABNORMAL LOW (ref 39.0–52.0)
Hemoglobin: 8.8 g/dL — ABNORMAL LOW (ref 13.0–17.0)
Hemoglobin: 9.2 g/dL — ABNORMAL LOW (ref 13.0–17.0)
MCH: 27.9 pg (ref 26.0–34.0)
MCH: 28.2 pg (ref 26.0–34.0)
MCHC: 32.4 g/dL (ref 30.0–36.0)
MCHC: 32.7 g/dL (ref 30.0–36.0)
MCV: 86.3 fL (ref 80.0–100.0)
MCV: 86.2 fL (ref 80.0–100.0)
Platelets: 179 K/uL (ref 150–400)
Platelets: 178 K/uL (ref 150–400)
RBC: 3.15 MIL/uL — ABNORMAL LOW (ref 4.22–5.81)
RBC: 3.26 MIL/uL — ABNORMAL LOW (ref 4.22–5.81)
RDW: 13.6 % (ref 11.5–15.5)
RDW: 13.6 % (ref 11.5–15.5)
WBC: 4.3 K/uL (ref 4.0–10.5)
WBC: 4.4 K/uL (ref 4.0–10.5)
nRBC: 0 % (ref 0.0–0.2)
nRBC: 0 % (ref 0.0–0.2)

## 2024-07-19 NOTE — Plan of Care (Signed)

## 2024-07-19 NOTE — Progress Notes (Signed)
 At approximately 2327, patient called out and reported feeling hot, dizzy and nauseated while lying in bed stretching his right hand out and had a significant amount of pain with the movement that resolved when he stopped moving it, sbp dropped in the 90s - now sbp 107 heart rate 70s-80s, no bradycardia noted at the time, glucose 144. Symptoms resolved except nausea.   Prn zofran  IV given as ordered.  Dr. Franky notified of episode patient experienced.

## 2024-07-19 NOTE — Evaluation (Signed)
 Physical Therapy Evaluation Patient Details Name: Luis Poole MRN: [id number] DOB: Oct 02, 1940 Today's Date: 07/19/2024  History of Present Illness  Luis Poole is a 83 y.o. male with medical history significant for coronary artery disease status post PCI with drug-eluting stent placement x 2 on 06/11/2024, type 2 diabetes mellitus, chronic left foot drop, essential hypertension, hyperlipidemia, anemia of chronic disease with baseline hemoglobin 9-12, who is admitted to [facility] on 07/17/2024 with cellulitis of the right upper extremity after presenting from home to [facility] ED complaining of right hand swelling.      The patient underwent left heart cath with right radial access on 06/11/2024, at which time he underwent PCI with drug-eluting stent placement x 1 to the mid LAD as well as PCI with drug-eluting stent placement x 1 to the first diagonal.  Clinical Impression  Pt admitted with above diagnosis and presents to PT with functional limitations due to deficits listed below (See PT problem list). Pt needs skilled PT to maximize independence and safety. Pt acknowledges history of falls and prefers to perform mobility his way and does not want to alter methods at this time. Pt pleasant and states he would rather fall 100 times than go back to using sliding board for transfers. Pt will always be a fall risk in any environment and plans to return home. Recommend resuming HHPT.           If plan is discharge home, recommend the following: A little help with walking and/or transfers;A little help with bathing/dressing/bathroom;Assist for transportation;Help with stairs or ramp for entrance   Can travel by private vehicle        Equipment Recommendations None recommended by PT  Recommendations for Other Services       Functional Status Assessment Patient has had a recent decline in their functional status and demonstrates the ability to make significant improvements in function in a  reasonable and predictable amount of time.     Precautions / Restrictions Precautions Precautions: Fall Required Braces or Orthoses: Other Brace Other Brace: Lt AFO - external on shoe Restrictions Weight Bearing Restrictions Per Provider Order: No      Mobility  Bed Mobility               General bed mobility comments: Pt up on EOB with OT    Transfers Overall transfer level: Needs assistance Equipment used: Rolling walker (2 wheels) Transfers: Sit to/from Stand, Bed to chair/wheelchair/BSC Sit to Stand: Min assist Stand pivot transfers: Min assist, +2 safety/equipment         General transfer comment: Unable to fully stand from EOB with walker due to sore rt hand. Able to perform stand pivot without walker bed to recliner with min assist and uncontrolled descent to chair. Flexed posture throughout    Ambulation/Gait               General Gait Details: Unable  Stairs            Wheelchair Mobility     Tilt Bed    Modified Rankin (Stroke Patients Only)       Balance Overall balance assessment: Needs assistance Sitting-balance support: No upper extremity supported, Feet supported Sitting balance-Leahy Scale: Fair     Standing balance support: Bilateral upper extremity supported, During functional activity, Reliant on assistive device for balance Standing balance-Leahy Scale: Poor Standing balance comment: BUE support and CGA  Pertinent Vitals/Pain Pain Assessment Pain Assessment: 0-10 Pain Score: 2  Pain Location: R hand Pain Descriptors / Indicators: Sore Pain Intervention(s): Limited activity within patient's tolerance    Home Living Family/patient expects to be discharged to:: Private residence Living Arrangements: Children Available Help at Discharge: Family;Available PRN/intermittently Type of Home: House Home Access: Ramped entrance       Home Layout: One level Home Equipment:  Rollator (4 wheels);Rolling Walker (2 wheels);Shower seat - built in;Wheelchair - manual;Toilet riser;Hospital bed;Grab bars - toilet;Grab bars - tub/shower Additional Comments: has L AFO    Prior Function Prior Level of Function : Independent/Modified Independent;History of Falls (last six months)             Mobility Comments: uses RW for short distances, uses w/c otherwise, reports 3 falls in last 6 months 1 fall sliding from w/c and 2 other falls during standing ADLs Comments: Ind with bathing, dressing, toileting, cooks somes     Extremity/Trunk Assessment   Upper Extremity Assessment Upper Extremity Assessment: Defer to OT evaluation RUE Deficits / Details: edematous, pain    Lower Extremity Assessment Lower Extremity Assessment: Generalized weakness;LLE deficits/detail LLE Deficits / Details: Chronic weakness from CVA    Cervical / Trunk Assessment Cervical / Trunk Assessment: Kyphotic  Communication   Communication Communication: No apparent difficulties    Cognition Arousal: Alert Behavior During Therapy: WFL for tasks assessed/performed   PT - Cognitive impairments: Safety/Judgement                         Following commands: Intact       Cueing Cueing Techniques: Verbal cues     General Comments      Exercises     Assessment/Plan    PT Assessment Patient needs continued PT services  PT Problem List Decreased balance;Decreased activity tolerance;Decreased mobility;Decreased knowledge of use of DME;Decreased safety awareness;Decreased knowledge of precautions       PT Treatment Interventions DME instruction;Functional mobility training;Therapeutic activities;Therapeutic exercise;Balance training;Patient/family education;Wheelchair mobility training;Gait training    PT Goals (Current goals can be found in the Care Plan section)  Acute Rehab PT Goals Patient Stated Goal: to go home PT Goal Formulation: With patient Time For Goal  Achievement: 08/02/24 Potential to Achieve Goals: Fair    Frequency Min 2X/week     Co-evaluation PT/OT/SLP Co-Evaluation/Treatment: Yes Reason for Co-Treatment: For patient/therapist safety           AM-PAC PT 6 Clicks Mobility  Outcome Measure Help needed turning from your back to your side while in a flat bed without using bedrails?: A Little Help needed moving from lying on your back to sitting on the side of a flat bed without using bedrails?: A Little Help needed moving to and from a bed to a chair (including a wheelchair)?: A Little Help needed standing up from a chair using your arms (e.g., wheelchair or bedside chair)?: A Little Help needed to walk in hospital room?: Total Help needed climbing 3-5 steps with a railing? : Total 6 Click Score: 14    End of Session   Activity Tolerance: Patient tolerated treatment well Patient left: in chair;with call bell/phone within reach;with chair alarm set   PT Visit Diagnosis: History of falling (Z91.81);Repeated falls (R29.6);Muscle weakness (generalized) (M62.81)    Time: 8773-8758 PT Time Calculation (min) (ACUTE ONLY): 15 min   Charges:   PT Evaluation $PT Eval Moderate Complexity: 1 Mod   PT General Charges $$ ACUTE PT VISIT:  1 Visit         [facility] PT Acute Rehabilitation Services Office [phone]   [facility] ORN [facility] 07/19/2024, 2:16 PM

## 2024-07-19 NOTE — TOC Initial Note (Signed)
 Transition of Care [facility]) - Initial/Assessment Note    Patient Details  Name: Luis Poole MRN: [id number] Date of Birth: May 05, 1941  [facility] CM/SW Contact:    Sudie Erminio Deems, RN Phone Number: 07/19/2024, 3:58 PM  Clinical Narrative:  Risk for readmission assessment completed. Patient presented for right hand swelling. PTA patient was from home with his son. Patient has DME rolling walker and wheelchair in the home. From the previous admission home health was arranged via [facility]. ICM did call [facility] and the agency will continue to follow for home health. Patient has insurance and PCP.  ICM will continue to follow for disposition needs as the patient progresses.                   Expected Discharge Plan: Home w Home Health Services Barriers to Discharge: Continued Medical Work up   Patient Goals and CMS Choice Patient states their goals for this hospitalization and ongoing recovery are:: Plans to return home  Expected Discharge Plan and Services   Discharge Planning Services: CM Consult Post Acute Care Choice: Home Health, Resumption of Svcs/PTA Provider Living arrangements for the past 2 months: Single Family Home    HH Arranged: PT HH Agency: [facility] Date [facility] Agency Contacted: 07/19/24 Time HH Agency Contacted: 1224 Representative spoke with at [facility] Agency: [place]  Prior Living Arrangements/Services Living arrangements for the past 2 months: Single Family Home Lives with:: Adult Children Patient language and need for interpreter reviewed:: Yes Do you feel safe going back to the place where you live?: Yes      Need for Family Participation in Patient Care: Yes (Comment) Care giver support system in place?: Yes (comment) Current home services: DME (walker and wheelchair.) Criminal Activity/Legal Involvement Pertinent to Current Situation/Hospitalization: No - Comment as needed  Activities of Daily Living   ADL  Screening (condition at time of admission) Independently performs ADLs?: No Does the patient have a NEW difficulty with bathing/dressing/toileting/self-feeding that is expected to last >3 days?: No Does the patient have a NEW difficulty with getting in/out of bed, walking, or climbing stairs that is expected to last >3 days?: No Does the patient have a NEW difficulty with communication that is expected to last >3 days?: No Is the patient deaf or have difficulty hearing?: No Does the patient have difficulty seeing, even when wearing glasses/contacts?: No Does the patient have difficulty concentrating, remembering, or making decisions?: No  Permission Sought/Granted Permission sought to share information with : Case Manager, Magazine Features Editor, Family Supports Permission granted to share information with : Yes, Verbal Permission Granted   Emotional Assessment Appearance:: Appears stated age Attitude/Demeanor/Rapport: Engaged Affect (typically observed): Appropriate Orientation: : Oriented to Self, Oriented to Place, Oriented to  Time Alcohol / Substance Use: Not Applicable Psych Involvement: No (comment)  Admission diagnosis:  Cellulitis of right upper extremity [L03.113] Swelling of right hand [M79.89] Patient Active Problem List   Diagnosis Date Noted   Hematoma of arm, right, initial encounter 07/17/2024   Cellulitis of right upper extremity 07/17/2024   History of essential hypertension 07/17/2024   HLD (hyperlipidemia) 07/17/2024   Anemia of chronic disease 07/17/2024   Gout 07/14/2024   Presence of drug coated stent in LAD coronary artery 07/12/2024   CAD (coronary artery disease) 07/11/2024   Chest pain 06/11/2024   Chronic pain 06/08/2024   Debility 12/25/2023   Lightheadedness 12/24/2023   Coronary artery disease involving native coronary artery of native heart  without angina pectoris 08/12/2023   Pressure injury of skin 08/12/2023   Hypokalemia 08/12/2023    AKI (acute kidney injury) 08/11/2023   Near syncope 11/26/2022   Syncope 10/06/2017   NSTEMI (non-ST elevated myocardial infarction) (HCC) 10/06/2017   Low back pain 06/28/2017   Leg swelling 08/23/2013   Swelling of right hand 08/23/2013   Neuropathy 08/23/2013   Acute gout 08/23/2013   D-dimer, elevated 08/23/2013   Chronic kidney disease, stage 3a (HCC) 08/23/2013   Chronic diastolic heart failure (HCC) 08/23/2013   Normocytic anemia 06/08/2013   Hypertension 08/29/2012   DM2 (diabetes mellitus, type 2) (HCC) 08/29/2012   History of CVA (cerebrovascular accident) 08/28/2012    Class: Acute   L-S radiculopathy 08/28/2012   Lumbar post-laminectomy syndrome 01/15/2012   Thoracic or lumbosacral neuritis or radiculitis, unspecified 01/15/2012   Left hemiparesis (HCC) 01/15/2012   Diabetic peripheral neuropathy (HCC) 01/15/2012   PCP:  Pura Lenis, MD Pharmacy:   [facility] [phone] [place], [place] - [address] AT [facility] [address] [facility] [address] [place] [address] Phone: [phone] Fax: [phone]  Social Drivers of Health (SDOH) Social History: SDOH Screenings   Food Insecurity: Food Insecurity Present (07/18/2024)  Housing: Low Risk  (07/18/2024)  Transportation Needs: Unmet Transportation Needs (07/18/2024)  Utilities: Not At Risk (07/18/2024)  Financial Resource Strain: Low Risk  (09/25/2023)   Received from [facility]  Physical Activity: Unknown (06/26/2023)   Received from [facility]  Social Connections: Moderately Isolated (07/18/2024)  Stress: Stress Concern Present (06/26/2023)   Received from [facility]  Tobacco Use: Medium Risk (07/17/2024)   Readmission Risk Interventions    07/19/2024    3:55 PM 07/12/2024    3:20 PM  Readmission Risk Prevention Plan  Transportation Screening Complete Complete  Medication Review (RN Care Manager) Referral to Pharmacy Complete  PCP or Specialist appointment within  3-5 days of discharge  Complete  HRI or Home Care Consult Complete Complete  SW Recovery Care/Counseling Consult Complete   Palliative Care Screening Not Applicable Not Applicable  Skilled Nursing Facility Not Applicable Not Applicable

## 2024-07-19 NOTE — Progress Notes (Signed)
 PROGRESS NOTE    Luis Poole  FMW:[id number] DOB: Aug 20, 1940 DOA: 07/17/2024 PCP: Pura Lenis, MD  No chief complaint on file.   Brief Narrative:   Luis Poole is Luis Poole 83 y.o. male with medical history significant for coronary artery disease status post PCI with drug-eluting stent placement x 2 on 06/11/2024, type 2 diabetes mellitus, chronic left foot drop, essential hypertension, hyperlipidemia, anemia of chronic disease with baseline hemoglobin 9-12, who is admitted to [facility] on 07/17/2024 with cellulitis of the right upper extremity after presenting from home to [facility] ED complaining of right hand swelling.   Assessment & Plan:   Principal Problem:   Cellulitis of right upper extremity Active Problems:   DM2 (diabetes mellitus, type 2) (HCC)   Swelling of right hand   History of essential hypertension   HLD (hyperlipidemia)   Anemia of chronic disease  Right Upper Extremity Cellulitis Right Hand Infection Fever at presentation.  Sepsis ruled out.  No tachycardia, tachypnea, or white count. Due to recent LHC (06/12/2023) with right radial access, cardiology was consulted - CTA RUE normal.  RUE arterial duplex without obstruction, no pseuod/AVF/hematoma.  (Note he had arterial US  on 10/26 as well due to wrist and joint pain after the cath, US  was negative for pseudoaneurysm). Negative RUE US  for DVT. Cardiology recommended hand surgery c/s, CTA as noted above Dr. Delene recommending broad spectrum abx, elevated upper extremity, ice prn - MRI unlikely high yield Will plan for another 24 hrs IV abx and consider d/c 12/2 Elevated CRP, sed rate  Hx CAD, s/p LHC with DES 05/2023 Continue DAPT, statin ranexa   T2DM SSI A1c 5.6 06/2024  Hypertension Amlodipine  currently on hold Continue valsartan  (pharmacy substitute), continue imdur   Anemia Trend  Dyslipidemia Statin  GERD PPI  Gout allopurinol     DVT prophylaxis: lovenox  Code Status:  full Family Communication: none Disposition:   Status is: Inpatient Remains inpatient appropriate because: need for continued orthopedic care   Consultants:  Hand surgery cardiology  Procedures:  none  Antimicrobials:  Anti-infectives (From admission, onward)    Start     Dose/Rate Route Frequency Ordered Stop   07/18/24 1130  vancomycin (VANCOREADY) IVPB 1250 mg/250 mL        1,250 mg 166.7 mL/hr over 90 Minutes Intravenous Every 24 hours 07/18/24 1035     07/18/24 1100  vancomycin (VANCOREADY) IVPB 750 mg/150 mL  Status:  Discontinued        750 mg 150 mL/hr over 60 Minutes Intravenous Every 12 hours 07/17/24 2129 07/18/24 1035   07/17/24 2130  cefTRIAXone (ROCEPHIN) 1 g in sodium chloride  0.9 % 100 mL IVPB        1 g 200 mL/hr over 30 Minutes Intravenous Every 24 hours 07/17/24 2124     07/17/24 2130  vancomycin (VANCOREADY) IVPB 500 mg/100 mL        500 mg 100 mL/hr over 60 Minutes Intravenous  Once 07/17/24 2129 07/18/24 0021   07/17/24 2100  vancomycin (VANCOCIN) IVPB 1000 mg/200 mL premix        1,000 mg 200 mL/hr over 60 Minutes Intravenous  Once 07/17/24 2050 07/17/24 2203       Subjective: Feeling ok, continued R hand pain and swelling, limited flexion   Objective: Vitals:   07/18/24 2339 07/19/24 0444 07/19/24 0832 07/19/24 1146  BP: 107/62 129/75 112/62 (!) 102/59  Pulse:  67 73 68  Resp:  18 17 20   Temp:  98.2 F (36.8 C)  98.4 F (36.9 C) 97.9 F (36.6 C)  TempSrc:  Oral Oral Oral  SpO2:  99% 98% 99%  Weight:  127.1 kg    Height:        Intake/Output Summary (Last 24 hours) at 07/19/2024 1346 Last data filed at 07/18/2024 2236 Gross per 24 hour  Intake 250 ml  Output 750 ml  Net -500 ml   Filed Weights   07/17/24 2300 07/18/24 0444 07/19/24 0444  Weight: 78.9 kg 78.6 kg 127.1 kg    Examination:  General: No acute distress. Cardiovascular: RRR Lungs: unlabored Neurological: Alert and oriented 3. Moves all extremities 4 with equal  strength. Cranial nerves II through XII grossly intact. Extremities: continued RUE swelling and TTP - no fluctuance noted   Data Reviewed: I have personally reviewed following labs and imaging studies  CBC: Recent Labs  Lab 07/14/24 0230 07/17/24 1631 07/17/24 2354 07/19/24 0225 07/19/24 0633  WBC 4.8 6.9 5.0 4.3 4.4  NEUTROABS  --  4.3 2.4  --   --   HGB 9.5* 10.5* 9.8* 8.8* 9.2*  HCT 29.4* 32.6* 29.9* 27.2* 28.1*  MCV 85.7 86.9 85.2 86.3 86.2  PLT 115* 175 170 179 178    Basic Metabolic Panel: Recent Labs  Lab 07/13/24 0304 07/14/24 0230 07/17/24 1631 07/17/24 2354 07/18/24 1353 07/19/24 0225 07/19/24 0633  NA 138 135 135 134* 134* 135 136  K 3.9 3.8 3.9 3.6 3.4* 3.5 3.6  CL 105 104 103 103 105 105 105  CO2 23 24 20* 22 20* 23 23  GLUCOSE 92 106* 102* 119* 166* 122* 114*  BUN 14 12 15 16 14 16 15   CREATININE 1.18 1.18 1.05 1.22 1.17 1.35* 1.34*  CALCIUM  9.0 8.6* 8.8* 8.6* 8.3* 8.1* 8.2*  MG 1.9 1.8 1.9 1.9  --  1.9  --     GFR: Estimated Creatinine Clearance: 55.3 mL/min (Milla Wahlberg) (by C-G formula based on SCr of 1.34 mg/dL (H)).  Liver Function Tests: Recent Labs  Lab 07/17/24 2354  AST 14*  ALT 9  ALKPHOS 47  BILITOT 1.0  PROT 6.6  ALBUMIN 2.8*    CBG: Recent Labs  Lab 07/18/24 1635 07/18/24 2136 07/19/24 0024 07/19/24 0710 07/19/24 1145  GLUCAP 81 139* 144* 103* 110*     Recent Results (from the past 240 hours)  Resp panel by RT-PCR (RSV, Flu Yue Flanigan&B, Covid) Anterior Nasal Swab     Status: None   Collection Time: 07/17/24  2:35 PM   Specimen: Anterior Nasal Swab  Result Value Ref Range Status   SARS Coronavirus 2 by RT PCR NEGATIVE NEGATIVE Final   Influenza Natally Ribera by PCR NEGATIVE NEGATIVE Final   Influenza B by PCR NEGATIVE NEGATIVE Final    Comment: (NOTE) The Xpert Xpress SARS-CoV-2/FLU/RSV plus assay is intended as an aid in the diagnosis of influenza from Nasopharyngeal swab specimens and should not be used as Xavius Spadafore sole basis for treatment.  Nasal washings and aspirates are unacceptable for Xpert Xpress SARS-CoV-2/FLU/RSV testing.  Fact Sheet for Patients: bloggercourse.com  Fact Sheet for Healthcare Providers: seriousbroker.it  This test is not yet approved or cleared by the [place]  FDA and has been authorized for detection and/or diagnosis of SARS-CoV-2 by FDA under an Emergency Use Authorization (EUA). This EUA will remain in effect (meaning this test can be used) for the duration of the COVID-19 declaration under Section 564(b)(1) of the Act, 21 U.S.C. section 360bbb-3(b)(1), unless the authorization is terminated or revoked.     Resp  Syncytial Virus by PCR NEGATIVE NEGATIVE Final    Comment: (NOTE) Fact Sheet for Patients: bloggercourse.com  Fact Sheet for Healthcare Providers: seriousbroker.it  This test is not yet approved or cleared by the [place]  FDA and has been authorized for detection and/or diagnosis of SARS-CoV-2 by FDA under an Emergency Use Authorization (EUA). This EUA will remain in effect (meaning this test can be used) for the duration of the COVID-19 declaration under Section 564(b)(1) of the Act, 21 U.S.C. section 360bbb-3(b)(1), unless the authorization is terminated or revoked.  Performed at [facility], [address] [place], [place] [address]          Radiology Studies: VAS US  UPPER EXTREMITY ARTERIAL DUPLEX Result Date: 07/18/2024  UPPER EXTREMITY DUPLEX STUDY Patient Name:  Luis Poole  Date of Exam:   07/17/2024 Medical Rec #: [id number]      Accession #:    [id number] Date of Birth: 10-01-1940       Patient Gender: M Patient Age:   30 years Exam Location:  [facility] Procedure:      VAS US  UPPER EXTREMITY ARTERIAL DUPLEX Referring Phys: TINNIE MATTER --------------------------------------------------------------------------------  Indications: Pain and  swelling of right hand. History:     Patient has Jacara Benito history of upper extremity pain and catheterization              via right radial artery 05/2024.  Risk Factors:  Hypertension, Diabetes, past history of smoking, prior MI,                coronary artery disease, prior CVA. Other Factors: History of gout, CKD Comparison       Prior negative right upper extremity arterial duplex done Study:           06/13/24 Performing Technologist: Alberta Lis RVS  Examination Guidelines: Ashlay Altieri complete evaluation includes B-mode imaging, spectral Doppler, color Doppler, and power Doppler as needed of all accessible portions of each vessel. Bilateral testing is considered an integral part of Jousha Schwandt complete examination. Limited examinations for reoccurring indications may be performed as noted.  Right Doppler Findings: +-------------+----------+---------+--------+--------+ Site         PSV (cm/s)Waveform StenosisComments +-------------+----------+---------+--------+--------+                                                  +-------------+----------+---------+--------+--------+ Brachial Prox123       triphasic                 +-------------+----------+---------+--------+--------+ Brachial Dist141       triphasic                 +-------------+----------+---------+--------+--------+ Radial Prox  151       triphasic                 +-------------+----------+---------+--------+--------+ Radial Mid   113       triphasic                 +-------------+----------+---------+--------+--------+ Radial Dist  156       triphasic                 +-------------+----------+---------+--------+--------+ Ulnar Prox   57        triphasic                 +-------------+----------+---------+--------+--------+ Ulnar Mid    115  triphasic                 +-------------+----------+---------+--------+--------+ Ulnar Dist   58        triphasic                  +-------------+----------+---------+--------+--------+    Summary:  Right: No obstruction visualized in the right upper extremity.        Significant edema noted throughout forearm.  No evidence of pseudo, AVF or hematoma. *See table(s) above for measurements and observations. Electronically signed by Norman Serve on 07/18/2024 at 9:01:00 AM.    Final    VAS US  UPPER EXTREMITY VENOUS DUPLEX Result Date: 07/18/2024 UPPER VENOUS STUDY  Patient Name:  Luis Poole  Date of Exam:   07/17/2024 Medical Rec #: [id number]      Accession #:    [id number] Date of Birth: 04-Sep-1940       Patient Gender: M Patient Age:   60 years Exam Location:  [facility] Procedure:      VAS US  UPPER EXTREMITY VENOUS DUPLEX Referring Phys: [facility] --------------------------------------------------------------------------------  Indications: Swelling of hand/joint and right arm/joint pain. Risk Factors: Status post right radial cath 10/25. Limitations: Patient unable to turn palm up secondary to pain. Patient has on multiple layers of clothing and cannot remove any of them except the jacket, secondary to arm pain. Comparison Study: No prior UEV on file. Prior normal right UEA duplex done                   06/12/24 Performing Technologist: Alberta Lis RVS  Examination Guidelines: Lavergne Hiltunen complete evaluation includes B-mode imaging, spectral Doppler, color Doppler, and power Doppler as needed of all accessible portions of each vessel. Bilateral testing is considered an integral part of Maguire Killmer complete examination. Limited examinations for reoccurring indications may be performed as noted.  Right Findings: +----------+------------+---------+-----------+----------+-------+ RIGHT     CompressiblePhasicitySpontaneousPropertiesSummary +----------+------------+---------+-----------+----------+-------+ IJV                      Yes       Yes                       +----------+------------+---------+-----------+----------+-------+ Subclavian               Yes       Yes                      +----------+------------+---------+-----------+----------+-------+ Axillary                 Yes       No                       +----------+------------+---------+-----------+----------+-------+ Brachial      Full                                          +----------+------------+---------+-----------+----------+-------+ Radial        Full                                          +----------+------------+---------+-----------+----------+-------+ Ulnar         Full                                          +----------+------------+---------+-----------+----------+-------+  Cephalic      Full                                          +----------+------------+---------+-----------+----------+-------+ Basilic       Full                                          +----------+------------+---------+-----------+----------+-------+  Left Findings: +----------+------------+---------+-----------+----------+-------+ LEFT      CompressiblePhasicitySpontaneousPropertiesSummary +----------+------------+---------+-----------+----------+-------+ Subclavian               Yes       Yes                      +----------+------------+---------+-----------+----------+-------+  Summary:  Right: No evidence of deep vein thrombosis in the upper extremity. No evidence of superficial vein thrombosis in the upper extremity. Subcutaneous edema noted throughout the forearm and hand. No pseudoaneurysm noted. Radial and ulnar arteries are patent with normal waveforms.  Left: No evidence of thrombosis in the subclavian.  *See table(s) above for measurements and observations.  Diagnosing physician: Norman Serve Electronically signed by Norman Serve on 07/18/2024 at 9:00:39 AM.    Final    CT ANGIO UP EXTREM RIGHT W &/OR WO CONTRAST Result Date: 07/17/2024 EXAM:  CTA RIGHT UPPER EXTREMITY 07/17/2024 08:31:30 PM TECHNIQUE: Contrast-enhanced computed tomography angiography of the upper extremity was performed with multiplanar reconstructions. Automated exposure control, iterative reconstruction, and/or weight based adjustment of the mA/kV was utilized to reduce the radiation dose to as low as reasonably achievable. COMPARISON: None available. CLINICAL HISTORY: AVM, hand; AVM, wrist AVM, hand; AVM, wrist FINDINGS: ARTERIAL: The axillary, brachial, radial and ulnar arteries are patent with no stenosis, occlusion, aneurysm or dissection. No abnormal arterial enhancement pattern is observed. BONES AND SOFT TISSUES: No significant osseous or soft tissue abnormalities seen within the field of view. IMPRESSION: 1. Normal CTA of the upper extremity. Electronically signed by: Norman Gatlin MD 07/17/2024 08:40 PM EST RP Workstation: [id number]   DG Hand 2 View Right Result Date: 07/17/2024 CLINICAL DATA:  Swelling EXAM: RIGHT HAND - 2 VIEW COMPARISON:  None Available. FINDINGS: There is diffuse soft tissue swelling of the hand and wrist. There is no acute fracture or dislocation identified. There severe degenerative changes of the first metacarpophalangeal joint, radiocarpal joint, ulnar carpal joint and first carpometacarpal joint with joint space narrowing, sclerosis and osteophyte formation. IMPRESSION: 1. Diffuse soft tissue swelling of the hand and wrist. 2. 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 Electronically Signed   By: Greig Pique M.D.   On: 07/17/2024 16:29        Scheduled Meds:  allopurinol   100 mg Oral Daily   aspirin  EC  81 mg Oral Daily   atorvastatin   80 mg Oral QPC supper   clopidogrel   75 mg Oral Q breakfast   enoxaparin  (LOVENOX ) injection  40 mg Subcutaneous Q24H   ferrous sulfate   325 mg Oral Q breakfast   insulin  aspart  0-5 Units Subcutaneous  QHS   insulin  aspart  0-9 Units Subcutaneous TID WC   irbesartan   150 mg Oral Daily   isosorbide  mononitrate  60 mg Oral Daily   pantoprazole   40 mg Oral Daily   ranolazine   500 mg Oral BID   Continuous Infusions:  cefTRIAXone (ROCEPHIN)  IV 1 g (07/18/24 2138)   vancomycin 1,250 mg (07/19/24 1114)     LOS: 2 days    Time spent: over 30 min     Meliton Monte, MD [facility]   To contact the attending provider between 7A-7P or the covering provider during after hours 7P-7A, please log into the web site [email or web address] and access using universal  password for that web site. If you do not have the password, please call the hospital operator.  07/19/2024, 1:46 PM

## 2024-07-19 NOTE — Progress Notes (Signed)
 Orders received from provider.  Patient also mentioned these episodes happen from time to time at home.  He stated no known cause has been identified by his doctors and that he takes a pill for dizziness at home.  Dr. Franky updated with those details as well.

## 2024-07-19 NOTE — Evaluation (Signed)
 Occupational Therapy Evaluation Patient Details Name: Luis Poole MRN: [id number] DOB: 19-Sep-1940 Today's Date: 07/19/2024   History of Present Illness   Luis Poole is a 83 y.o. male with medical history significant for coronary artery disease status post PCI with drug-eluting stent placement x 2 on 06/11/2024, type 2 diabetes mellitus, chronic left foot drop, essential hypertension, hyperlipidemia, anemia of chronic disease with baseline hemoglobin 9-12, who is admitted to [facility] on 07/17/2024 with cellulitis of the right upper extremity after presenting from home to [facility] ED complaining of right hand swelling.      The patient underwent left heart cath with right radial access on 06/11/2024, at which time he underwent PCI with drug-eluting stent placement x 1 to the mid LAD as well as PCI with drug-eluting stent placement x 1 to the first diagonal.     Clinical Impressions Pt presents with decline in function and safety with ADLs and ADL mobility with impaired strength, balance, endurance and R hand AROM/coordination. Pt recently d/c from this hospital to home. PTA pt reports that he was  Ind with bathing, dressing, toileting, cooks some, uses RW for short distances, uses w/c otherwise, reports 3 falls in last 6 months 1 fall sliding from w/c and 2 other falls during standing, has L AFO. Pt currently requires min A to sit EOB and to scoot hips to EOB, CGA with UB ADLs mod A with LB ADLs, min A +2 STS. Attempted using RW STS, however R hand painful and edematous. Pt was able to power up from EOB with recliner positioned to R side at EOB. Pt had difficulty initiating and with increased time required to power up and pivot to chair with hard and uncontrolled speed of descent with trunk and knees flexed throughout. Pt reports that pain and swelling in R hand has improved some. Pt instructed on AROM, elevated positioning of R hand; pt verbalized  and demonstrated understanding. Pt seems ot have  decreased insight into deficits and impaired safety awareness related to fall prevention. OT will continue to follow acutely to maximize level of function and safety     If plan is discharge home, recommend the following:   A lot of help with bathing/dressing/bathroom;A little help with walking and/or transfers;Assistance with cooking/housework;Assist for transportation;Help with stairs or ramp for entrance     Functional Status Assessment   Patient has had a recent decline in their functional status and demonstrates the ability to make significant improvements in function in a reasonable and predictable amount of time.     Equipment Recommendations   Other (comment) (slide board)     Recommendations for Other Services         Precautions/Restrictions         Mobility Bed Mobility Overal bed mobility: Needs Assistance Bed Mobility: Supine to Sit     Supine to sit: Min assist     General bed mobility comments: min A with elevating trunk, scooting hips forward, increased time    Transfers Overall transfer level: Needs assistance Equipment used: Rolling walker (2 wheels) Transfers: Sit to/from Stand, Bed to chair/wheelchair/BSC Sit to Stand: Min assist Stand pivot transfers: Min assist, +2 safety/equipment         General transfer comment: attempted using RW STS, however R hand painful and edematous. Pt was able to power up from EOB with recliner positioned to R side at EOB. Pt had difficulty initiating and with increased time required to power up and pivot to chair  with hard and uncontrolled speed of descent. Pt with trunk and knees flexed throughout      Balance Overall balance assessment: Needs assistance Sitting-balance support: No upper extremity supported, Feet supported Sitting balance-Leahy Scale: Fair     Standing balance support: Bilateral upper extremity supported, During functional activity, Reliant on assistive device for balance Standing  balance-Leahy Scale: Poor                             ADL either performed or assessed with clinical judgement   ADL Overall ADL's : Needs assistance/impaired Eating/Feeding: Independent;Sitting   Grooming: Wash/dry hands;Wash/dry face;Set up;Supervision/safety;Sitting   Upper Body Bathing: Contact guard assist;Sitting   Lower Body Bathing: Moderate assistance;Sitting/lateral leans   Upper Body Dressing : Contact guard assist;Sitting   Lower Body Dressing: Moderate assistance;Sitting/lateral leans;Sit to/from stand   Toilet Transfer: Minimal assistance;+2 for physical assistance;Stand-pivot   Toileting- Clothing Manipulation and Hygiene: Maximal assistance;Sit to/from stand       Functional mobility during ADLs: Minimal assistance;+2 for physical assistance;Cueing for safety       Vision Baseline Vision/History: 1 Wears glasses;4 Cataracts Ability to See in Adequate Light: 0 Adequate       Perception         Praxis         Pertinent Vitals/Pain Pain Assessment Pain Assessment: 0-10 Pain Score: 2  Pain Location: R hand Pain Descriptors / Indicators: Sore Pain Intervention(s): Monitored during session, Limited activity within patient's tolerance, Repositioned     Extremity/Trunk Assessment Upper Extremity Assessment Upper Extremity Assessment: Generalized weakness;Left hand dominant;RUE deficits/detail RUE Deficits / Details: edematous, pain   Lower Extremity Assessment Lower Extremity Assessment: Defer to PT evaluation       Communication Communication Communication: No apparent difficulties   Cognition Arousal: Alert Behavior During Therapy: WFL for tasks assessed/performed               OT - Cognition Comments: pt seems to have poor insight into deficits                 Following commands: Intact       Cueing  General Comments   Cueing Techniques: Verbal cues      Exercises Other Exercises Other Exercises: pt  instructed on R hand ROM (flex/ext), keeping R hand elevated and provided with edema massage. Reports 2/10 pain in R hand   Shoulder Instructions      Home Living Family/patient expects to be discharged to:: Private residence Living Arrangements: Children Available Help at Discharge: Family;Available PRN/intermittently Type of Home: House Home Access: Ramped entrance     Home Layout: One level     Bathroom Shower/Tub: Producer, Television/film/video: Standard     Home Equipment: Rollator (4 wheels);Rolling Walker (2 wheels);Shower seat - built in;Wheelchair - manual;Toilet riser;Hospital bed;Grab bars - toilet;Grab bars - tub/shower   Additional Comments: has L AFO      Prior Functioning/Environment Prior Level of Function : Independent/Modified Independent;History of Falls (last six months)             Mobility Comments: uses RW for short distances, uses w/c otherwise, reports 3 falls in last 6 months 1 fall sliding from w/c and 2 other falls during standing ADLs Comments: Ind with bathing, dressing, toileting, cooks somes    OT Problem List: Decreased strength;Decreased range of motion;Decreased coordination;Decreased activity tolerance;Impaired balance (sitting and/or standing);Decreased safety awareness;Pain   OT Treatment/Interventions: Self-care/ADL training;Therapeutic  exercise;Patient/family education;Balance training;Therapeutic activities;DME and/or AE instruction      OT Goals(Current goals can be found in the care plan section)   Acute Rehab OT Goals Patient Stated Goal: go home OT Goal Formulation: With patient Time For Goal Achievement: 08/02/24 Potential to Achieve Goals: Good ADL Goals Pt Will Perform Grooming: with supervision;with set-up;sitting Pt Will Perform Upper Body Bathing: with supervision;with set-up;sitting Pt Will Perform Lower Body Bathing: with min assist;sitting/lateral leans Pt Will Perform Upper Body Dressing: with  supervision;with set-up;sitting Pt Will Perform Lower Body Dressing: with min assist;sitting/lateral leans Pt Will Transfer to Toilet: with min assist;with contact guard assist;stand pivot transfer Pt Will Perform Toileting - Clothing Manipulation and hygiene: with mod assist;with min assist;sitting/lateral leans;sit to/from stand   OT Frequency:  Min 2X/week    Co-evaluation              AM-PAC OT 6 Clicks Daily Activity     Outcome Measure Help from another person eating meals?: None Help from another person taking care of personal grooming?: A Little Help from another person toileting, which includes using toliet, bedpan, or urinal?: A Lot Help from another person bathing (including washing, rinsing, drying)?: A Lot Help from another person to put on and taking off regular upper body clothing?: A Little Help from another person to put on and taking off regular lower body clothing?: A Lot 6 Click Score: 16   End of Session Equipment Utilized During Treatment: Gait belt Nurse Communication: Mobility status  Activity Tolerance: Patient tolerated treatment well Patient left: in chair;with call bell/phone within reach;with chair alarm set  OT Visit Diagnosis: Unsteadiness on feet (R26.81);Other abnormalities of gait and mobility (R26.89);Muscle weakness (generalized) (M62.81);History of falling (Z91.81);Pain Pain - Right/Left: Right Pain - part of body: Hand                Time: 8793-8758 OT Time Calculation (min): 35 min Charges:  OT General Charges $OT Visit: 1 Visit OT Evaluation $OT Eval Moderate Complexity: 1 Mod OT Treatments $Self Care/Home Management : 8-22 mins    Jacques Karna Loose 07/19/2024, 1:23 PM

## 2024-07-20 ENCOUNTER — Other Ambulatory Visit (HOSPITAL_COMMUNITY): Payer: Self-pay

## 2024-07-20 DIAGNOSIS — L03113 Cellulitis of right upper limb: Secondary | ICD-10-CM | POA: Diagnosis not present

## 2024-07-20 LAB — CBC
HCT: 27.2 % — ABNORMAL LOW (ref 39.0–52.0)
Hemoglobin: 8.8 g/dL — ABNORMAL LOW (ref 13.0–17.0)
MCH: 27.7 pg (ref 26.0–34.0)
MCHC: 32.4 g/dL (ref 30.0–36.0)
MCV: 85.5 fL (ref 80.0–100.0)
Platelets: 189 K/uL (ref 150–400)
RBC: 3.18 MIL/uL — ABNORMAL LOW (ref 4.22–5.81)
RDW: 13.4 % (ref 11.5–15.5)
WBC: 4.1 K/uL (ref 4.0–10.5)
nRBC: 0 % (ref 0.0–0.2)

## 2024-07-20 LAB — BASIC METABOLIC PANEL WITH GFR
Anion gap: 6 (ref 5–15)
BUN: 13 mg/dL (ref 8–23)
CO2: 24 mmol/L (ref 22–32)
Calcium: 8.3 mg/dL — ABNORMAL LOW (ref 8.9–10.3)
Chloride: 106 mmol/L (ref 98–111)
Creatinine, Ser: 1.33 mg/dL — ABNORMAL HIGH (ref 0.61–1.24)
GFR, Estimated: 54 mL/min — ABNORMAL LOW (ref >60)
Glucose, Bld: 122 mg/dL — ABNORMAL HIGH (ref 70–99)
Potassium: 3.9 mmol/L (ref 3.5–5.1)
Sodium: 136 mmol/L (ref 135–145)

## 2024-07-20 LAB — GLUCOSE, CAPILLARY
Glucose-Capillary: 137 mg/dL — ABNORMAL HIGH (ref 70–99)
Glucose-Capillary: 126 mg/dL — ABNORMAL HIGH (ref 70–99)

## 2024-07-20 LAB — C-REACTIVE PROTEIN: CRP: 4.7 mg/dL — ABNORMAL HIGH (ref <1.0)

## 2024-07-20 MED ORDER — DOXYCYCLINE HYCLATE 100 MG PO TABS: 100.0000 mg | ORAL_TABLET | Freq: Two times a day (BID) | ORAL | 0 refills | Status: AC

## 2024-07-20 MED ORDER — AMOXICILLIN-POT CLAVULANATE 875-125 MG PO TABS: 1.0000 | ORAL_TABLET | Freq: Two times a day (BID) | ORAL | 0 refills | Status: AC

## 2024-07-20 MED ORDER — AMLODIPINE BESYLATE 5 MG PO TABS: 5.0000 mg | ORAL_TABLET | Freq: Every day | ORAL | Status: AC

## 2024-07-20 NOTE — TOC Transition Note (Addendum)
 Transition of Care [facility]) - Discharge Note   Patient Details  Name: Luis Poole MRN: [id number] Date of Birth: [birth year]  [facility] CM/SW Contact:  Sudie Erminio Deems, RN Phone Number: 07/20/2024, 2:53 PM   Clinical Narrative: Patient plans for discharge home. Patient has called his sister Chiquita and she is unable to assist with transportation. ICM did call the patient's son Lynwood at [phone] and he can receive the patient in the home. The discharge lounge will assist with a cab voucher and a cab will transport patient home. Son will receive the cab and have the wheelchair for patient to transport to. Patient is currently in the chair and can stand to pivot. Patient has concerns that the his wheelchair brakes are hard to connect. ICM did call [employer] to see if a technician can assist in the home. Chiquita sister is aware that the patient will transition home via cab. No further needs identified at this time.   ICM did speak with sister and she states that she manages the patients money and she does not want him to take PTAR home since the patient may end of with a bill. She states the patient will be able to be transported home via cab.   Final next level of care: Home w Home Health Services Barriers to Discharge: No Barriers Identified   Patient Goals and CMS Choice Patient states their goals for this hospitalization and ongoing recovery are:: Plans to return home  Discharge Plan and Services Additional resources added to the After Visit Summary for     Discharge Planning Services: CM Consult Post Acute Care Choice: Home Health, Resumption of Svcs/PTA Provider            HH Arranged: PT HH Agency: [facility] Date [facility] Agency Contacted: 07/19/24 Time HH Agency Contacted: 1224 Representative spoke with at [facility] Agency: [place]  Social Drivers of Health (SDOH) Interventions SDOH Screenings   Food Insecurity: Food Insecurity Present (07/18/2024)   Housing: Low Risk  (07/18/2024)  Transportation Needs: Unmet Transportation Needs (07/18/2024)  Utilities: Not At Risk (07/18/2024)  Financial Resource Strain: Low Risk  (09/25/2023)   Received from [facility]  Physical Activity: Unknown (06/26/2023)   Received from [facility]  Social Connections: Moderately Isolated (07/18/2024)  Stress: Stress Concern Present (06/26/2023)   Received from [facility]  Tobacco Use: Medium Risk (07/17/2024)   Readmission Risk Interventions    07/19/2024    3:55 PM 07/12/2024    3:20 PM  Readmission Risk Prevention Plan  Transportation Screening Complete Complete  Medication Review (RN Care Manager) Referral to Pharmacy Complete  PCP or Specialist appointment within 3-5 days of discharge  Complete  HRI or Home Care Consult Complete Complete  SW Recovery Care/Counseling Consult Complete   Palliative Care Screening Not Applicable Not Applicable  Skilled Nursing Facility Not Applicable Not Applicable

## 2024-07-20 NOTE — Progress Notes (Signed)
 Completed the AVS for the RN. Made both the patient's RN and CM aware that the patient is trying to arrange a ride. He may need PTAR transportation if unsuccessful.

## 2024-07-20 NOTE — Progress Notes (Signed)
 Discharge   Patient expressed verbal understanding of discharge POC.   Patient given time to ask any questions.  Additional education included in AVS.  Alert oriented in good spirits.    PIV removed.  Pressure dressings intact.  Patient's son contacted Lynwood Carol [phone], by CM.  Patient will go home in a taxi and son will be waiting for him at home.  Discharge lounge called for transport.

## 2024-07-20 NOTE — Progress Notes (Signed)
 Patient ID: Luis Poole, male   DOB: 09/14/1940, 83 y.o.   MRN: [id number]   LOS: 3 days   Subjective: Doing better, still has some swelling   Objective: Vital signs in last 24 hours: Temp:  [97.3 F (36.3 C)-98.3 F (36.8 C)] 97.5 F (36.4 C) (12/02 0731) Pulse Rate:  [62-68] 63 (12/02 0731) Resp:  [15-20] 16 (12/02 0731) BP: (102-140)/(59-82) 140/75 (12/02 0731) SpO2:  [96 %-100 %] 100 % (12/02 0731) Weight:  [126.8 kg] 126.8 kg (12/02 0522) Last BM Date : 07/18/24   Laboratory  CBC Recent Labs    07/19/24 0633 07/20/24 0757  WBC 4.4 4.1  HGB 9.2* 8.8*  HCT 28.1* 27.2*  PLT 178 189   BMET Recent Labs    07/19/24 0633 07/20/24 0757  NA 136 136  K 3.6 3.9  CL 105 106  CO2 23 24  GLUCOSE 114* 122*  BUN 15 13  CREATININE 1.34* 1.33*  CALCIUM  8.2* 8.3*     Physical Exam General appearance: alert and no distress RUE: Shoulder, elbow, wrist, digits- no skin wounds, nontender, no instability, no blocks to motion, mild hand/distal FA edema, mild limited ROM index finger  Sens  Ax/R/M/U intact  Mot   Ax/ R/ PIN/ M/ AIN/ U intact  Rad 2+    Assessment/Plan: RUE cellulitis -- Will recheck CRP, otherwise CPM   Luis DOROTHA Ned, PA-C Orthopedic Surgery [phone] 07/20/2024

## 2024-07-20 NOTE — Progress Notes (Signed)
 Pt refused standing weight this AM, requested weight to be recorded from bed instead.

## 2024-07-20 NOTE — Discharge Summary (Signed)
 Physician Discharge Summary  [name] FMW:[id number] DOB: 1941-02-17 DOA: 07/17/2024  PCP: Pura Lenis, MD  Admit date: 07/17/2024 Discharge date: 07/20/2024  Time spent: 40 minutes  Recommendations for Outpatient Follow-up:  Follow outpatient CBC/CMP  Follow with orthopedics, hand surgery outpatient for R hand cellulitis Complete abx course   Discharge Diagnoses:  Principal Problem:   Cellulitis of right upper extremity Active Problems:   DM2 (diabetes mellitus, type 2) (HCC)   Swelling of right hand   History of essential hypertension   HLD (hyperlipidemia)   Anemia of chronic disease   Discharge Condition: stable  Diet recommendation: heart healthy  Filed Weights   07/18/24 0444 07/19/24 0444 07/20/24 0522  Weight: 78.6 kg 127.1 kg 126.8 kg    History of present illness:   Luis Poole is Luis Poole 83 y.o. male with medical history significant for coronary artery disease status post PCI with drug-eluting stent placement x 2 on 06/11/2024, type 2 diabetes mellitus, chronic left foot drop, essential hypertension, hyperlipidemia, anemia of chronic disease with baseline hemoglobin 9-12, who is admitted to [facility] on 07/17/2024 with cellulitis of the right upper extremity after presenting from home to [facility] ED complaining of right hand swelling.   He's improved with antibiotics for R hand swelling.  Discharged with PO antibiotics.    Hospital Course:  Assessment and Plan:  Right Upper Extremity Cellulitis Right Hand Infection Fever at presentation.  Sepsis ruled out.  No tachycardia, tachypnea, or white count. Due to recent LHC (06/12/2023) with right radial access, cardiology was consulted - CTA RUE normal.  RUE arterial duplex without obstruction, no pseuod/AVF/hematoma.  (Note he had arterial US  on 10/26 as well due to wrist and joint pain after the cath, US  was negative for pseudoaneurysm). Negative RUE US  for DVT. Cardiology recommended hand surgery c/s, CTA  as noted above Dr. Delene recommending broad spectrum abx, elevated upper extremity, ice prn - MRI unlikely high yield Appreciate ortho assistance, follow outpatinet Elevated CRP, sed rate - awaiting repeat CRP today   Hx CAD, s/p LHC with DES 05/2023 Continue DAPT, statin ranexa    T2DM A1c 5.6 06/2024  Hypertension Amlodipine , valsartan , imdur    Anemia Trend   Dyslipidemia Statin   GERD PPI   Gout allopurinol    Class III Obesity Body mass index is 43.78 kg/m.    Procedures: none   Consultations: Ortho cardiology  Discharge Exam: Vitals:   07/20/24 0522 07/20/24 0731  BP: 120/63 (!) 140/75  Pulse: 62 63  Resp: 16 16  Temp: 98.1 F (36.7 C) (!) 97.5 F (36.4 C)  SpO2: 98% 100%   Improving, but persistent discomfort  General: No acute distress. Cardiovascular: RRR Lungs: unlabored Neurological: Alert and oriented 3. Moves all extremities 4 with equal strength. Cranial nerves II through XII grossly intact. Extremities: R hand swelling - improved, better flexion today, though still limited ability to fully close hand into Luis Poole fist  Discharge Instructions   Discharge Instructions     Call MD for:  difficulty breathing, headache or visual disturbances   Complete by: As directed    Call MD for:  extreme fatigue   Complete by: As directed    Call MD for:  hives   Complete by: As directed    Call MD for:  persistant dizziness or light-headedness   Complete by: As directed    Call MD for:  persistant nausea and vomiting   Complete by: As directed    Call MD for:  redness,  tenderness, or signs of infection (pain, swelling, redness, odor or green/yellow discharge around incision site)   Complete by: As directed    Call MD for:  severe uncontrolled pain   Complete by: As directed    Call MD for:  temperature >100.4   Complete by: As directed    Diet - low sodium heart healthy   Complete by: As directed    Discharge instructions   Complete by:  As directed    You were seen for right hand and arm swelling.  This is due to [name] skin infection (cellulitis).  Your imaging and evaluation by hand surgery did not reveal any concerning findings.  We'll send you home with another 10 days of antibiotics to take by mouth.   Increase activity slowly   Complete by: As directed       Allergies as of 07/20/2024       Reactions   Ibuprofen  Itching   Lisinopril Swelling, Other (See Comments)   Swollen lips   Pregabalin  Swelling   Gabapentin  Other (See Comments)   Dizziness        Medication List     STOP taking these medications    celecoxib 100 MG capsule Commonly known as: CELEBREX       TAKE these medications    allopurinol  100 MG tablet Commonly known as: ZYLOPRIM  Take 100 mg by mouth daily as needed (for gout flares- as directed).   amLODipine  5 MG tablet Commonly known as: NORVASC  Take 1 tablet (5 mg total) by mouth daily. What changed: medication strength   amoxicillin -clavulanate 875-125 MG tablet Commonly known as: AUGMENTIN  Take 1 tablet by mouth 2 (two) times daily for 10 days.   aspirin  EC 81 MG tablet Take 1 tablet (81 mg total) by mouth daily.   atorvastatin  80 MG tablet Commonly known as: LIPITOR  Take 1 tablet (80 mg total) by mouth daily. What changed: when to take this   clopidogrel  75 MG tablet Commonly known as: PLAVIX  Take 1 tablet (75 mg total) by mouth daily with breakfast.   colchicine  0.6 MG tablet Take 1 tablet (0.6 mg total) daily by mouth. What changed:  when to take this reasons to take this   cyclobenzaprine  5 MG tablet Commonly known as: FLEXERIL  Take 1 tablet (5 mg total) by mouth 2 (two) times daily as needed for muscle spasms.   doxycycline  100 MG tablet Commonly known as: VIBRA -TABS Take 1 tablet (100 mg total) by mouth 2 (two) times daily for 10 days.   ferrous sulfate  325 (65 FE) MG tablet Take 325 mg by mouth daily with breakfast.   isosorbide  mononitrate 60 MG 24  hr tablet Commonly known as: IMDUR  Take 1 tablet (60 mg total) by mouth daily.   lidocaine  5 % Commonly known as: Lidoderm  Place 1 patch onto the skin daily. Remove & Discard patch within 12 hours or as directed by MD   meclizine  12.5 MG tablet Commonly known as: ANTIVERT  Take 12.5 mg by mouth 3 (three) times daily as needed for dizziness.   nitroGLYCERIN  0.4 MG SL tablet Commonly known as: NITROSTAT  Place 1 tablet (0.4 mg total) under the tongue every 5 (five) minutes as needed for chest pain (if need more than 2 doses, please call your doctor.).   omeprazole  40 MG capsule Commonly known as: PRILOSEC Take 40 mg by mouth daily as needed (for heartburn).   polyethylene glycol 17 g packet Commonly known as: MIRALAX  / GLYCOLAX  Take 17 g by mouth daily  as needed for mild constipation.   potassium chloride  10 MEQ tablet Commonly known as: KLOR-CON  M Take 10 mEq by mouth See admin instructions. Take 10 mEq by mouth with breakfast and lunch   ranolazine  500 MG 12 hr tablet Commonly known as: RANEXA  Take 1 tablet (500 mg total) by mouth 2 (two) times daily.   TYLENOL  500 MG tablet Generic drug: acetaminophen  Take 500-1,000 mg by mouth every 6 (six) hours as needed for mild pain (pain score 1-3) (or headaches).   valsartan  160 MG tablet Commonly known as: DIOVAN  Take 160 mg by mouth daily.       Allergies  Allergen Reactions   Ibuprofen  Itching   Lisinopril Swelling and Other (See Comments)    Swollen lips   Pregabalin  Swelling   Gabapentin  Other (See Comments)    Dizziness    Follow-up Information     [facility] Follow up.   Specialty: Home Health Services Why: Physical Therapy Contact information: [address] [place] [address] [phone]                  The results of significant diagnostics from this hospitalization (including imaging, microbiology, ancillary and laboratory) are listed below for reference.     Significant Diagnostic Studies: VAS US  UPPER EXTREMITY ARTERIAL DUPLEX Result Date: 07/18/2024  UPPER EXTREMITY DUPLEX STUDY Patient Name:  CAIDYN HENRICKSEN  Date of Exam:   07/17/2024 Medical Rec #: [id number]      Accession #:    [id number] Date of Birth: [birth year]       Patient Gender: M Patient Age:   83 years Exam Location:  [facility] Procedure:      VAS US  UPPER EXTREMITY ARTERIAL DUPLEX Referring Phys: TINNIE MATTER --------------------------------------------------------------------------------  Indications: Pain and swelling of right hand. History:     Patient has Juanantonio Stolar history of upper extremity pain and catheterization              via right radial artery 05/2024.  Risk Factors:  Hypertension, Diabetes, past history of smoking, prior MI,                coronary artery disease, prior CVA. Other Factors: History of gout, CKD Comparison       Prior negative right upper extremity arterial duplex done Study:           06/13/24 Performing Technologist: Alberta Lis RVS  Examination Guidelines: Ines Warf complete evaluation includes B-mode imaging, spectral Doppler, color Doppler, and power Doppler as needed of all accessible portions of each vessel. Bilateral testing is considered an integral part of Fran Neiswonger complete examination. Limited examinations for reoccurring indications may be performed as noted.  Right Doppler Findings: +-------------+----------+---------+--------+--------+ Site         PSV (cm/s)Waveform StenosisComments +-------------+----------+---------+--------+--------+                                                  +-------------+----------+---------+--------+--------+ Brachial Prox123       triphasic                 +-------------+----------+---------+--------+--------+ Brachial Dist141       triphasic                 +-------------+----------+---------+--------+--------+ Radial Prox  151       triphasic                  +-------------+----------+---------+--------+--------+  Radial Mid   113       triphasic                 +-------------+----------+---------+--------+--------+ Radial Dist  156       triphasic                 +-------------+----------+---------+--------+--------+ Ulnar Prox   57        triphasic                 +-------------+----------+---------+--------+--------+ Ulnar Mid    115       triphasic                 +-------------+----------+---------+--------+--------+ Ulnar Dist   58        triphasic                 +-------------+----------+---------+--------+--------+    Summary:  Right: No obstruction visualized in the right upper extremity.        Significant edema noted throughout forearm.  No evidence of pseudo, AVF or hematoma. *See table(s) above for measurements and observations. Electronically signed by Norman Serve on 07/18/2024 at 9:01:00 AM.    Final    VAS US  UPPER EXTREMITY VENOUS DUPLEX Result Date: 07/18/2024 UPPER VENOUS STUDY  Patient Name:  BRYON PARKER  Date of Exam:   07/17/2024 Medical Rec #: [id number]      Accession #:    [id number] Date of Birth: 12/07/1940       Patient Gender: M Patient Age:   71 years Exam [facility]  [facility] Procedure:      VAS US  UPPER EXTREMITY VENOUS DUPLEX Referring Phys: [facility] MEREDITH --------------------------------------------------------------------------------  Indications: Swelling of hand/joint and right arm/joint pain. Risk Factors: Status post right radial cath 10/25. Limitations: Patient unable to turn palm up secondary to pain. Patient has on multiple layers of clothing and cannot remove any of them except the jacket, secondary to arm pain. Comparison Study: No prior UEV on file. Prior normal right UEA duplex done                   06/12/24 Performing Technologist: Alberta Lis RVS  Examination Guidelines: Navie Lamoreaux complete evaluation includes B-mode imaging, spectral Doppler, color Doppler, and power Doppler as  needed of all accessible portions of each vessel. Bilateral testing is considered an integral part of Yaniris Braddock complete examination. Limited examinations for reoccurring indications may be performed as noted.  Right Findings: +----------+------------+---------+-----------+----------+-------+ RIGHT     CompressiblePhasicitySpontaneousPropertiesSummary +----------+------------+---------+-----------+----------+-------+ IJV                      Yes       Yes                      +----------+------------+---------+-----------+----------+-------+ Subclavian               Yes       Yes                      +----------+------------+---------+-----------+----------+-------+ Axillary                 Yes       No                       +----------+------------+---------+-----------+----------+-------+ Brachial      Full                                          +----------+------------+---------+-----------+----------+-------+  Radial        Full                                          +----------+------------+---------+-----------+----------+-------+ Ulnar         Full                                          +----------+------------+---------+-----------+----------+-------+ Cephalic      Full                                          +----------+------------+---------+-----------+----------+-------+ Basilic       Full                                          +----------+------------+---------+-----------+----------+-------+  Left Findings: +----------+------------+---------+-----------+----------+-------+ LEFT      CompressiblePhasicitySpontaneousPropertiesSummary +----------+------------+---------+-----------+----------+-------+ Subclavian               Yes       Yes                      +----------+------------+---------+-----------+----------+-------+  Summary:  Right: No evidence of deep vein thrombosis in the upper extremity. No evidence of superficial  vein thrombosis in the upper extremity. Subcutaneous edema noted throughout the forearm and hand. No pseudoaneurysm noted. Radial and ulnar arteries are patent with normal waveforms.  Left: No evidence of thrombosis in the subclavian.  *See table(s) above for measurements and observations.  Diagnosing physician: Norman Serve Electronically signed by Norman Serve on 07/18/2024 at 9:00:39 AM.    Final    CT ANGIO UP EXTREM RIGHT W &/OR WO CONTRAST Result Date: 07/17/2024 EXAM: CTA RIGHT UPPER EXTREMITY 07/17/2024 08:31:30 PM TECHNIQUE: Contrast-enhanced computed tomography angiography of the upper extremity was performed with multiplanar reconstructions. Automated exposure control, iterative reconstruction, and/or weight based adjustment of the mA/kV was utilized to reduce the radiation dose to as low as reasonably achievable. COMPARISON: None available. CLINICAL HISTORY: AVM, hand; AVM, wrist AVM, hand; AVM, wrist FINDINGS: ARTERIAL: The axillary, brachial, radial and ulnar arteries are patent with no stenosis, occlusion, aneurysm or dissection. No abnormal arterial enhancement pattern is observed. BONES AND SOFT TISSUES: No significant osseous or soft tissue abnormalities seen within the field of view. IMPRESSION: 1. Normal CTA of the upper extremity. Electronically signed by: Norman Gatlin MD 07/17/2024 08:40 PM EST RP Workstation: [id number]   DG Hand 2 View Right Result Date: 07/17/2024 CLINICAL DATA:  Swelling EXAM: RIGHT HAND - 2 VIEW COMPARISON:  None Available. FINDINGS: There is diffuse soft tissue swelling of the hand and wrist. There is no acute fracture or dislocation identified. There severe degenerative changes of the first metacarpophalangeal joint, radiocarpal joint, ulnar carpal joint and first carpometacarpal joint with joint space narrowing, sclerosis and osteophyte formation. IMPRESSION: 1. Diffuse soft tissue swelling of the hand and wrist. 2. Severe degenerative changes of the first  metacarpophalangeal joint, radiocarpal joint, ulnar carpal joint and first carpometacarpal joint. Electronically Signed   By: Greig Pique M.D.   On: 07/17/2024 16:29   DG Chest 2 View Result Date:  07/10/2024 EXAM: 2 VIEW(S) XRAY OF THE CHEST 07/10/2024 10:14:00 PM COMPARISON: 06/07/2024 CLINICAL HISTORY: cp cp FINDINGS: LINES, TUBES AND DEVICES: Spinal cord stimulator remains in place, unchanged. LUNGS AND PLEURA: No focal pulmonary opacity. No pleural effusion. No pneumothorax. HEART AND MEDIASTINUM: No acute abnormality of the cardiac and mediastinal silhouettes. BONES AND SOFT TISSUES: No acute osseous abnormality. IMPRESSION: 1. No acute cardiopulmonary process. Electronically signed by: Franky Crease MD 07/10/2024 10:28 PM EST RP Workstation: [id number]    Microbiology: Recent Results (from the past 240 hours)  Resp panel by RT-PCR (RSV, Flu Gissele Narducci&B, Covid) Anterior Nasal Swab     Status: None   Collection Time: 07/17/24  2:35 PM   Specimen: Anterior Nasal Swab  Result Value Ref Range Status   SARS Coronavirus 2 by RT PCR NEGATIVE NEGATIVE Final   Influenza [facility] by PCR NEGATIVE NEGATIVE Final   Influenza B by PCR NEGATIVE NEGATIVE Final    Comment: (NOTE) The Xpert Xpress SARS-CoV-2/FLU/RSV plus assay is intended as an aid in the diagnosis of influenza from Nasopharyngeal swab specimens and should not be used as Craig Wisnewski sole basis for treatment. Nasal washings and aspirates are unacceptable for Xpert Xpress SARS-CoV-2/FLU/RSV testing.  Fact Sheet for Patients: bloggercourse.com  Fact Sheet for Healthcare Providers: seriousbroker.it  This test is not yet approved or cleared by the [place]  FDA and has been authorized for detection and/or diagnosis of SARS-CoV-2 by FDA under an Emergency Use Authorization (EUA). This EUA will remain in effect (meaning this test can be used) for the duration of the COVID-19 declaration under Section  564(b)(1) of the Act, 21 U.S.C. section 360bbb-3(b)(1), unless the authorization is terminated or revoked.     Resp Syncytial Virus by PCR NEGATIVE NEGATIVE Final    Comment: (NOTE) Fact Sheet for Patients: bloggercourse.com  Fact Sheet for Healthcare Providers: seriousbroker.it  This test is not yet approved or cleared by the [place]  FDA and has been authorized for detection and/or diagnosis of SARS-CoV-2 by FDA under an Emergency Use Authorization (EUA). This EUA will remain in effect (meaning this test can be used) for the duration of the COVID-19 declaration under Section 564(b)(1) of the Act, 21 U.S.C. section 360bbb-3(b)(1), unless the authorization is terminated or revoked.  Performed at [facility], [address], [place], [place] [address]      Labs: Basic Metabolic Panel: Recent Labs  Lab 07/14/24 0230 07/17/24 1631 07/17/24 2354 07/18/24 1353 07/19/24 0225 07/19/24 0633 07/20/24 0757  NA 135 135 134* 134* 135 136 136  K 3.8 3.9 3.6 3.4* 3.5 3.6 3.9  CL 104 103 103 105 105 105 106  CO2 24 20* 22 20* 23 23 24   GLUCOSE 106* 102* 119* 166* 122* 114* 122*  BUN 12 15 16 14 16 15 13   CREATININE 1.18 1.05 1.22 1.17 1.35* 1.34* 1.33*  CALCIUM  8.6* 8.8* 8.6* 8.3* 8.1* 8.2* 8.3*  MG 1.8 1.9 1.9  --  1.9  --   --    Liver Function Tests: Recent Labs  Lab 07/17/24 2354  AST 14*  ALT 9  ALKPHOS 47  BILITOT 1.0  PROT 6.6  ALBUMIN 2.8*   No results for input(s): LIPASE, AMYLASE in the last 168 hours. No results for input(s): AMMONIA in the last 168 hours. CBC: Recent Labs  Lab 07/17/24 1631 07/17/24 2354 07/19/24 0225 07/19/24 0633 07/20/24 0757  WBC 6.9 5.0 4.3 4.4 4.1  NEUTROABS 4.3 2.4  --   --   --  HGB 10.5* 9.8* 8.8* 9.2* 8.8*  HCT 32.6* 29.9* 27.2* 28.1* 27.2*  MCV 86.9 85.2 86.3 86.2 85.5  PLT 175 170 179 178 189   Cardiac Enzymes: No results for input(s): CKTOTAL,  CKMB, CKMBINDEX, TROPONINI in the last 168 hours. BNP: BNP (last 3 results) Recent Labs    05/07/24 2252  BNP 58.4    ProBNP (last 3 results) No results for input(s): PROBNP in the last 8760 hours.  CBG: Recent Labs  Lab 07/19/24 0710 07/19/24 1145 07/19/24 1600 07/19/24 2227 07/20/24 0802  GLUCAP 103* 110* 94 116* 137*       Signed:  Meliton Monte MD.  [facility] 07/20/2024, 10:41 AM

## 2024-07-20 NOTE — Care Management Important Message (Signed)
 Important Message  Patient Details  Name: Luis Poole MRN: [id number] Date of Birth: 01/22/1941   Important Message Given:  Yes - Medicare IM     [name] 07/20/2024, 10:37 AM

## 2024-07-21 ENCOUNTER — Encounter: Payer: Self-pay | Admitting: Orthopedic Surgery

## 2024-08-03 ENCOUNTER — Emergency Department (HOSPITAL_COMMUNITY): Admission: EM | Admit: 2024-08-03 | Discharge: 2024-08-03 | Disposition: A | Discharge status: Home / Self Care

## 2024-08-03 ENCOUNTER — Encounter (HOSPITAL_COMMUNITY): Payer: Self-pay

## 2024-08-03 ENCOUNTER — Other Ambulatory Visit: Payer: Self-pay

## 2024-08-03 ENCOUNTER — Emergency Department (HOSPITAL_COMMUNITY)

## 2024-08-03 DIAGNOSIS — M25552 Pain in left hip: Secondary | ICD-10-CM

## 2024-08-03 DIAGNOSIS — G8929 Other chronic pain: Secondary | ICD-10-CM | POA: Diagnosis not present

## 2024-08-03 DIAGNOSIS — Z7902 Long term (current) use of antithrombotics/antiplatelets: Secondary | ICD-10-CM | POA: Diagnosis not present

## 2024-08-03 DIAGNOSIS — M545 Low back pain, unspecified: Secondary | ICD-10-CM | POA: Diagnosis not present

## 2024-08-03 DIAGNOSIS — Z7982 Long term (current) use of aspirin: Secondary | ICD-10-CM | POA: Diagnosis not present

## 2024-08-03 MED ORDER — ACETAMINOPHEN 500 MG PO TABS: 500.0000 mg | ORAL_TABLET | Freq: Four times a day (QID) | ORAL | 0 refills | Status: AC | PRN

## 2024-08-03 MED ORDER — ACETAMINOPHEN 500 MG PO TABS
500.0000 mg | ORAL_TABLET | Freq: Once | ORAL | Status: AC
  Administered 2024-08-03: 04:00:00 500 mg via ORAL
  Filled 2024-08-03: qty 1

## 2024-08-03 NOTE — ED Triage Notes (Addendum)
 Pt BIB EMS from home with reports of chronic left hip pain. Pt reports recent falls, denies hitting head.

## 2024-08-03 NOTE — ED Notes (Signed)
 [facility] at bedside

## 2024-08-03 NOTE — Discharge Instructions (Signed)
 You can use Tylenol  as needed for pain.  Continue to follow-up with your primary care doctor as needed.  Return to the ER for new or worsening symptoms.

## 2024-08-03 NOTE — ED Notes (Signed)
PTAR called for pt 
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
